# Patient Record
Sex: Female | Born: 1965 | Race: Black or African American | Hispanic: No | Marital: Married | Smoking: Never smoker
Health system: Southern US, Community
[De-identification: ages and names within clinical notes are randomized; demographics above are authoritative.]

## PROBLEM LIST (undated history)

## (undated) DIAGNOSIS — J309 Allergic rhinitis, unspecified: Secondary | ICD-10-CM

## (undated) DIAGNOSIS — D689 Coagulation defect, unspecified: Secondary | ICD-10-CM

## (undated) DIAGNOSIS — Z86718 Personal history of other venous thrombosis and embolism: Secondary | ICD-10-CM

## (undated) DIAGNOSIS — E785 Hyperlipidemia, unspecified: Secondary | ICD-10-CM

## (undated) DIAGNOSIS — J339 Nasal polyp, unspecified: Secondary | ICD-10-CM

## (undated) DIAGNOSIS — T7840XA Allergy, unspecified, initial encounter: Secondary | ICD-10-CM

## (undated) DIAGNOSIS — T8859XA Other complications of anesthesia, initial encounter: Secondary | ICD-10-CM

## (undated) DIAGNOSIS — I10 Essential (primary) hypertension: Secondary | ICD-10-CM

## (undated) DIAGNOSIS — R112 Nausea with vomiting, unspecified: Secondary | ICD-10-CM

## (undated) DIAGNOSIS — M8580 Other specified disorders of bone density and structure, unspecified site: Secondary | ICD-10-CM

## (undated) DIAGNOSIS — E119 Type 2 diabetes mellitus without complications: Secondary | ICD-10-CM

## (undated) DIAGNOSIS — M1990 Unspecified osteoarthritis, unspecified site: Secondary | ICD-10-CM

## (undated) DIAGNOSIS — Z9889 Other specified postprocedural states: Secondary | ICD-10-CM

## (undated) DIAGNOSIS — Z973 Presence of spectacles and contact lenses: Secondary | ICD-10-CM

## (undated) HISTORY — PX: COLONOSCOPY: SHX174

## (undated) HISTORY — DX: Allergic rhinitis, unspecified: J30.9

## (undated) HISTORY — DX: Essential (primary) hypertension: I10

## (undated) HISTORY — DX: Type 2 diabetes mellitus without complications: E11.9

## (undated) HISTORY — DX: Nasal polyp, unspecified: J33.9

## (undated) HISTORY — DX: Personal history of other venous thrombosis and embolism: Z86.718

## (undated) HISTORY — DX: Allergy, unspecified, initial encounter: T78.40XA

## (undated) HISTORY — DX: Other specified disorders of bone density and structure, unspecified site: M85.80

## (undated) HISTORY — DX: Coagulation defect, unspecified: D68.9

## (undated) HISTORY — DX: Hyperlipidemia, unspecified: E78.5

## (undated) HISTORY — PX: POLYPECTOMY: SHX149

---

## 1999-02-06 ENCOUNTER — Other Ambulatory Visit: Admission: RE | Admit: 1999-02-06 | Discharge: 1999-02-06 | Payer: Self-pay | Admitting: *Deleted

## 2000-02-12 ENCOUNTER — Other Ambulatory Visit: Admission: RE | Admit: 2000-02-12 | Discharge: 2000-02-12 | Payer: Self-pay | Admitting: *Deleted

## 2001-04-20 ENCOUNTER — Other Ambulatory Visit: Admission: RE | Admit: 2001-04-20 | Discharge: 2001-04-20 | Payer: Self-pay | Admitting: *Deleted

## 2001-08-09 ENCOUNTER — Other Ambulatory Visit: Admission: RE | Admit: 2001-08-09 | Discharge: 2001-08-09 | Payer: Self-pay | Admitting: *Deleted

## 2001-08-09 ENCOUNTER — Encounter (INDEPENDENT_AMBULATORY_CARE_PROVIDER_SITE_OTHER): Payer: Self-pay | Admitting: Specialist

## 2003-12-21 DIAGNOSIS — Z86718 Personal history of other venous thrombosis and embolism: Secondary | ICD-10-CM | POA: Insufficient documentation

## 2003-12-21 HISTORY — DX: Personal history of other venous thrombosis and embolism: Z86.718

## 2003-12-21 HISTORY — PX: UTERINE FIBROID SURGERY: SHX826

## 2004-03-06 ENCOUNTER — Encounter: Admission: RE | Admit: 2004-03-06 | Discharge: 2004-03-06 | Payer: Self-pay | Admitting: Internal Medicine

## 2004-06-16 ENCOUNTER — Encounter: Admission: RE | Admit: 2004-06-16 | Discharge: 2004-06-16 | Payer: Self-pay | Admitting: Obstetrics and Gynecology

## 2004-09-09 ENCOUNTER — Ambulatory Visit (HOSPITAL_COMMUNITY): Admission: RE | Admit: 2004-09-09 | Discharge: 2004-09-09 | Payer: Self-pay | Admitting: Gynecology

## 2005-01-18 ENCOUNTER — Ambulatory Visit: Payer: Self-pay | Admitting: Internal Medicine

## 2005-02-17 ENCOUNTER — Ambulatory Visit: Payer: Self-pay | Admitting: Internal Medicine

## 2005-03-30 ENCOUNTER — Ambulatory Visit: Payer: Self-pay | Admitting: Internal Medicine

## 2005-05-27 ENCOUNTER — Ambulatory Visit: Payer: Self-pay | Admitting: Internal Medicine

## 2005-07-29 ENCOUNTER — Ambulatory Visit: Payer: Self-pay | Admitting: Internal Medicine

## 2005-09-28 ENCOUNTER — Ambulatory Visit: Payer: Self-pay | Admitting: Internal Medicine

## 2006-02-03 ENCOUNTER — Ambulatory Visit: Payer: Self-pay | Admitting: Internal Medicine

## 2006-04-05 ENCOUNTER — Ambulatory Visit: Payer: Self-pay | Admitting: Internal Medicine

## 2006-07-04 ENCOUNTER — Ambulatory Visit: Payer: Self-pay | Admitting: Internal Medicine

## 2006-09-23 ENCOUNTER — Ambulatory Visit: Payer: Self-pay | Admitting: Internal Medicine

## 2006-10-21 ENCOUNTER — Ambulatory Visit: Payer: Self-pay | Admitting: Internal Medicine

## 2006-11-09 ENCOUNTER — Ambulatory Visit: Payer: Self-pay | Admitting: Internal Medicine

## 2006-12-20 HISTORY — PX: NASAL SINUS SURGERY: SHX719

## 2006-12-20 HISTORY — PX: LASIK: SHX215

## 2007-01-09 ENCOUNTER — Ambulatory Visit: Payer: Self-pay | Admitting: Internal Medicine

## 2007-02-07 ENCOUNTER — Ambulatory Visit: Payer: Self-pay | Admitting: Internal Medicine

## 2007-08-24 ENCOUNTER — Ambulatory Visit: Payer: Self-pay | Admitting: Internal Medicine

## 2007-10-21 ENCOUNTER — Inpatient Hospital Stay (HOSPITAL_COMMUNITY): Admission: EM | Admit: 2007-10-21 | Discharge: 2007-10-23 | Payer: Self-pay | Admitting: Emergency Medicine

## 2007-11-22 ENCOUNTER — Encounter: Payer: Self-pay | Admitting: Internal Medicine

## 2008-01-01 ENCOUNTER — Telehealth: Payer: Self-pay | Admitting: Internal Medicine

## 2008-01-01 DIAGNOSIS — J449 Chronic obstructive pulmonary disease, unspecified: Secondary | ICD-10-CM

## 2008-01-01 DIAGNOSIS — J4541 Moderate persistent asthma with (acute) exacerbation: Secondary | ICD-10-CM

## 2008-01-01 DIAGNOSIS — Z8672 Personal history of thrombophlebitis: Secondary | ICD-10-CM

## 2008-01-01 DIAGNOSIS — J3089 Other allergic rhinitis: Secondary | ICD-10-CM

## 2008-01-01 DIAGNOSIS — J4540 Moderate persistent asthma, uncomplicated: Secondary | ICD-10-CM | POA: Insufficient documentation

## 2008-01-01 DIAGNOSIS — J302 Other seasonal allergic rhinitis: Secondary | ICD-10-CM

## 2008-01-01 DIAGNOSIS — J339 Nasal polyp, unspecified: Secondary | ICD-10-CM | POA: Insufficient documentation

## 2008-01-01 DIAGNOSIS — J4489 Other specified chronic obstructive pulmonary disease: Secondary | ICD-10-CM | POA: Insufficient documentation

## 2008-01-01 DIAGNOSIS — J330 Polyp of nasal cavity: Secondary | ICD-10-CM

## 2008-01-02 ENCOUNTER — Encounter: Payer: Self-pay | Admitting: Internal Medicine

## 2008-01-02 ENCOUNTER — Telehealth (INDEPENDENT_AMBULATORY_CARE_PROVIDER_SITE_OTHER): Payer: Self-pay | Admitting: *Deleted

## 2008-02-06 ENCOUNTER — Encounter: Payer: Self-pay | Admitting: Internal Medicine

## 2008-02-07 ENCOUNTER — Ambulatory Visit: Payer: Self-pay | Admitting: Internal Medicine

## 2008-06-12 ENCOUNTER — Encounter: Payer: Self-pay | Admitting: Internal Medicine

## 2008-12-16 ENCOUNTER — Ambulatory Visit: Payer: Self-pay | Admitting: Internal Medicine

## 2009-01-08 ENCOUNTER — Ambulatory Visit (HOSPITAL_COMMUNITY): Admission: RE | Admit: 2009-01-08 | Discharge: 2009-01-08 | Payer: Self-pay | Admitting: Obstetrics and Gynecology

## 2009-01-16 ENCOUNTER — Ambulatory Visit: Payer: Self-pay | Admitting: Oncology

## 2009-01-22 ENCOUNTER — Ambulatory Visit: Payer: Self-pay | Admitting: Hematology & Oncology

## 2009-02-03 ENCOUNTER — Telehealth: Payer: Self-pay | Admitting: Internal Medicine

## 2009-02-14 ENCOUNTER — Ambulatory Visit: Payer: Self-pay | Admitting: Internal Medicine

## 2009-03-17 ENCOUNTER — Ambulatory Visit: Payer: Self-pay | Admitting: Hematology & Oncology

## 2009-03-25 LAB — HYPERCOAGULABLE PANEL, COMPREHENSIVE RET.
Anticardiolipin IgA: 14 [APL'U] (ref <13)
Beta-2-Glycoprotein I IgM: <4 U/mL (ref <10)
Homocysteine: 10.9 umol/L (ref 4.0–15.4)
Protein C, Total: 80 % (ref 70–140)
Protein S Activity: 15 % — ABNORMAL LOW (ref 69–129)
Protein S Ag, Total: 52 % — ABNORMAL LOW (ref 70–140)

## 2009-04-09 ENCOUNTER — Telehealth (INDEPENDENT_AMBULATORY_CARE_PROVIDER_SITE_OTHER): Payer: Self-pay | Admitting: *Deleted

## 2009-04-10 ENCOUNTER — Telehealth (INDEPENDENT_AMBULATORY_CARE_PROVIDER_SITE_OTHER): Payer: Self-pay | Admitting: *Deleted

## 2009-05-01 ENCOUNTER — Telehealth (INDEPENDENT_AMBULATORY_CARE_PROVIDER_SITE_OTHER): Payer: Self-pay | Admitting: *Deleted

## 2009-05-28 ENCOUNTER — Encounter: Payer: Self-pay | Admitting: Internal Medicine

## 2009-07-10 ENCOUNTER — Encounter: Payer: Self-pay | Admitting: Internal Medicine

## 2009-07-18 ENCOUNTER — Telehealth: Payer: Self-pay | Admitting: Internal Medicine

## 2009-07-24 ENCOUNTER — Ambulatory Visit: Payer: Self-pay | Admitting: Internal Medicine

## 2009-07-31 ENCOUNTER — Telehealth: Payer: Self-pay | Admitting: Internal Medicine

## 2009-08-01 ENCOUNTER — Telehealth: Payer: Self-pay | Admitting: Internal Medicine

## 2009-10-14 ENCOUNTER — Encounter: Payer: Self-pay | Admitting: Internal Medicine

## 2009-12-03 ENCOUNTER — Encounter: Payer: Self-pay | Admitting: Internal Medicine

## 2009-12-04 ENCOUNTER — Telehealth: Payer: Self-pay | Admitting: Internal Medicine

## 2010-05-11 ENCOUNTER — Telehealth (INDEPENDENT_AMBULATORY_CARE_PROVIDER_SITE_OTHER): Payer: Self-pay | Admitting: *Deleted

## 2010-05-13 ENCOUNTER — Ambulatory Visit: Payer: Self-pay | Admitting: Internal Medicine

## 2010-05-14 ENCOUNTER — Telehealth (INDEPENDENT_AMBULATORY_CARE_PROVIDER_SITE_OTHER): Payer: Self-pay | Admitting: *Deleted

## 2010-08-11 ENCOUNTER — Ambulatory Visit: Payer: Self-pay | Admitting: Hematology & Oncology

## 2010-08-31 ENCOUNTER — Ambulatory Visit (HOSPITAL_COMMUNITY): Admission: RE | Admit: 2010-08-31 | Discharge: 2010-08-31 | Payer: Self-pay | Admitting: Obstetrics and Gynecology

## 2010-08-31 ENCOUNTER — Telehealth (INDEPENDENT_AMBULATORY_CARE_PROVIDER_SITE_OTHER): Payer: Self-pay | Admitting: *Deleted

## 2010-09-04 ENCOUNTER — Ambulatory Visit: Payer: Self-pay | Admitting: Internal Medicine

## 2010-10-07 ENCOUNTER — Ambulatory Visit: Payer: Self-pay | Admitting: Internal Medicine

## 2010-10-07 ENCOUNTER — Telehealth (INDEPENDENT_AMBULATORY_CARE_PROVIDER_SITE_OTHER): Payer: Self-pay | Admitting: *Deleted

## 2010-10-12 LAB — CONVERTED CEMR LAB: IgE (Immunoglobulin E), Serum: 34.6 intl units/mL (ref 0.0–180.0)

## 2010-10-27 ENCOUNTER — Encounter: Payer: Self-pay | Admitting: Internal Medicine

## 2010-11-19 ENCOUNTER — Ambulatory Visit: Payer: Self-pay | Admitting: Internal Medicine

## 2010-11-25 ENCOUNTER — Encounter: Payer: Self-pay | Admitting: Internal Medicine

## 2010-11-30 ENCOUNTER — Encounter: Payer: Self-pay | Admitting: Internal Medicine

## 2010-12-08 ENCOUNTER — Telehealth (INDEPENDENT_AMBULATORY_CARE_PROVIDER_SITE_OTHER): Payer: Self-pay | Admitting: *Deleted

## 2010-12-10 ENCOUNTER — Ambulatory Visit: Payer: Self-pay | Admitting: Internal Medicine

## 2010-12-24 LAB — CBC
HCT: 40.4 % (ref 36.0–46.0)
Hemoglobin: 13.3 g/dL (ref 12.0–15.0)
MCH: 26.1 pg (ref 26.0–34.0)
MCHC: 32.9 g/dL (ref 30.0–36.0)
MCV: 79.2 fL (ref 78.0–100.0)
Platelets: 269 10*3/uL (ref 150–400)
RBC: 5.1 MIL/uL (ref 3.87–5.11)
RDW: 13.6 % (ref 11.5–15.5)
WBC: 8 10*3/uL (ref 4.0–10.5)

## 2010-12-30 ENCOUNTER — Ambulatory Visit (HOSPITAL_COMMUNITY)
Admission: RE | Admit: 2010-12-30 | Discharge: 2010-12-30 | Payer: Self-pay | Source: Home / Self Care | Attending: Obstetrics and Gynecology | Admitting: Obstetrics and Gynecology

## 2011-01-04 LAB — HCG, SERUM, QUALITATIVE: Preg, Serum: NEGATIVE

## 2011-01-10 ENCOUNTER — Encounter: Payer: Self-pay | Admitting: Obstetrics and Gynecology

## 2011-01-14 ENCOUNTER — Ambulatory Visit
Admission: RE | Admit: 2011-01-14 | Discharge: 2011-01-14 | Payer: Self-pay | Source: Home / Self Care | Attending: Internal Medicine | Admitting: Internal Medicine

## 2011-01-19 NOTE — Assessment & Plan Note (Signed)
Summary: asthma flare//lmr   Primary Provider/Referring Provider:  Merla Riches  CC:  Asthma flare up-SOB and wheezing.[name]  History of Present Illness: 12/16/08- Asthma/copd, nasal polyps, allergic rhinitis No recent prednisone, probably in 4-5 months. Has breathed better since nasal polypectomy. Now 1-2 weeks incr wheeze, nonpurulent with no fever or pain. Probably a cold. Denies pain, fever, purulent ofr blood.  02/14/09- Asthma/copd, nasal polyps, allergic rhinitis Was well with incidental rhinorhea, until 2 days ago when she began more wheezing. Mentions stress at work but doesnt feel that she has a cold. Had continued prednisone 10 mg every other day. Denies fever, chest pain, palpitation, edema, sore throat, enlarged glands, rash, heart burn.  07/24/09- Asthma/ COPD, nasal polyps, allergic rhinitis Worse asthma 7-10 days. Treated by Dr Lazarus Salines then for sinusitis with nasal polyps returning. Taking clindamycin and cipro. No GI distress lately. has felt tired. Not sleeping well. GYN checked basic labs. No longer taking coumadin for hx DVT. Gets leg and hip aches as she comes off prednisone. She ran out 2 weeks ago.   May 19, 2010- Asthma/ COPD, nasal polyps, allergic rhinitis Failed Xolair,  DVT/ Leiden First here since last August. Says "this asthma has been kicking my butt". Got through winter ok. Back living  and working in [place] again. 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 Wheezes all day, worst at night. Cough and nasal mucus light yellow. Sore throat yesterday but no fever or nodes. Has ENT appt today with Dr Lazarus Salines. Denies heartburn/ reflux. Using Symbicort 160, using Ventolin rescue twice daily, nebulizer last used 2 days ago and last night. Started prednisone 10 mg x 2 every other day, starting a week ago. Trying to get pregnant- not now. Hasn't checked peak flow in a while- encouraged to do so.   Asthma History    Asthma Control Assessment:    Age  range: 12+ years    Symptoms: throughout the day    Nighttime Awakenings: 1-3/week    Interferes w/ normal activity: some limitations    SABA use (not for EIB): several times per day    Asthma Control Assessment: Very Poorly Controlled   Preventive Screening-Counseling & Management  Alcohol-Tobacco     Smoking Status: never  Current Medications (verified): 1)  Symbicort 160-4.5 Mcg/act  Aero (Budesonide-Formoterol Fumarate) .... Inhale 2 Puffs Two Times A Day 2)  Duoneb 2.5-0.5 Mg/18ml  Soln (Albuterol-Ipratropium) .... As Needed 3)  Ventolin Hfa 108 (90 Base) Mcg/act Aers (Albuterol Sulfate) .... 2 Puffs Four Times A Day As Needed 4)  Veramyst 27.5 Mcg/spray  Susp (Fluticasone Furoate) .[name].. 1 Sparya Each Nostril Once Daily 5)  Nasacort Aq 55 Mcg/act  Aers (Triamcinolone Acetonide(Nasal)) .... Take 2 Sprays in Each Nostril Once A Day As Needed 6)  Prednisone 10 Mg Tabs (Prednisone) .[name].. 1 Tab Four Times Daily X 2 Days, 3 Times Daily X 2 Days, 2 Times Daily X 2 Days, 1 Time Daily X 2 Days  Allergies (verified): 1)  ! Asa 2)  ! Pcn  Past History:  Past Medical History: Last updated: 02/07/2008 Asthma- steroid dependent Nasal polyps Aspirin sensitivity (Sampter's Triad) allergic rhinitis Deep Vein Thrombosis/Phlebitis- Factor V Leiden  Past Surgical History: Last updated: 02/07/2008 Uterine fibroids Nasal polypectomy  Family History: Last updated: May 19, 2010 Father died MI epilepsy hypertension liver disease Aunt- asthma, died multiple med probs  Social History: Last updated: 2010-05-19 Patient never smoked.  Married Manufacturing engineer.  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  Review of Systems      See HPI       The patient complains of shortness of breath with  activity, shortness of breath at rest, productive cough, and non-productive cough.  The patient denies coughing up blood, chest pain, irregular heartbeats, acid heartburn, indigestion, loss of appetite, weight change, abdominal pain, difficulty swallowing, sore throat, tooth/dental problems, headaches, nasal congestion/difficulty breathing through nose, and sneezing.    Vital Signs:  Patient profile:   45 year old female Height:      67 inches Weight:      198 pounds BMI:     31.12 O2 Sat:      96 % on Room air Pulse rate:   97 / minute BP sitting:   114 / 82  (left arm) Cuff size:   large  Vitals Entered By: Reynaldo Minium CMA (May 13, 2010 11:27 AM)  O2 Flow:  Room air  Physical Exam  Additional Exam:  General: A/Ox3; pleasant and cooperative, NAD, SKIN: no rash, lesions NODES: no lymphadenopathy HEENT: [place]/AT, EOM- WNL, Conjuctivae- clear, PERRLA, TM-WNL, Nose- clear- no polyps seen, sniffing, Throat- clear and wnl, not red, Mallampati  III NECK: Supple w/ fair ROM, JVD- none, normal carotid impulses w/o bruits Thyroid-  CHEST: coarse bilateral wheeze and mild rhonchi, no accessory mucscle use HEART: RRR, no m/g/r heard ABDOMEN: Soft and nl;  ZOX:WRUE, nl pulses, no edema  NEURO: Grossly intact to observation      Impression & Recommendations:  Problem # 1:  ASTHMA (ICD-493.90) Severe chronic asthma with exacerbation. Often steroid dependent and will need more now. There may be an infection. She has been reasonably compliant. Specific work place triggers and avoidable environmental factors don't seem important. Shehad failed Xolair and allergy vaccine.   Problem # 2:  ALLERGIC RHINITIS (ICD-477.9)  Rhinosinusitis with exacerbation. Will give Biaxin after discussing options. Her updated medication list for this problem includes:    Veramyst 27.5 Mcg/spray Susp (Fluticasone furoate) .[name]... 1 sparya each nostril once daily    Nasacort Aq 55 Mcg/act Aers (Triamcinolone  acetonide(nasal)) .[name]... Take 2 sprays in each nostril once a day as needed  Orders: Est. Patient Level IV (45409)  Problem # 3:  Hx of DEEP VEIN THROMBOSIS/PHLEBITIS (ICD-451.19) Factor V Leiden. Has been off anticoagulation over a year. Understands to avoid stasis/ stagnation.  Medications Added to Medication List This Visit: 1)  Ventolin Hfa 108 (90 Base) Mcg/act Aers (Albuterol sulfate) .... 2 puffs four times a day as needed 2)  Clarithromycin 500 Mg Tabs (Clarithromycin) .[name].. 1 twice daily after meals  Other Orders: Depo- Medrol 80mg  (J1040) Admin of Therapeutic Inj  intramuscular or subcutaneous (81191) Nebulizer Tx (47829)  Patient Instructions: 1)  Please schedule a follow-up appointment in 2 months. 2)  Scripts printed and sent as discussed 3)  Please use your peak flow meter so you can talk to me about what a best score is and what you are like on average and bad days. 4)  Neb xop 1.25 5)  depo 80 Prescriptions: NASACORT AQ 55 MCG/ACT  AERS (TRIAMCINOLONE ACETONIDE(NASAL)) Take 2 sprays in each nostril once a day as needed  #1 x prn   Entered and Authorized by:   Waymon Budge MD   Signed by:   Waymon Budge MD on 05/13/2010   Method  used:   Print then Give to Patient   RxID:   [id number] VENTOLIN HFA 108 (90 BASE) MCG/ACT AERS (ALBUTEROL SULFATE) 2 puffs four times a day as needed  #1 x prn   Entered and Authorized by:   Waymon Budge MD   Signed by:   Waymon Budge MD on 05/13/2010   Method used:   Print then Give to Patient   RxID:   [id number] SYMBICORT 160-4.5 MCG/ACT  AERO (BUDESONIDE-FORMOTEROL FUMARATE) Inhale 2 puffs two times a day  #1 x prn   Entered and Authorized by:   Waymon Budge MD   Signed by:   Waymon Budge MD on 05/13/2010   Method used:   Print then Give to Patient   RxID:   [id number] DUONEB 2.5-0.5 MG/3ML  SOLN (ALBUTEROL-IPRATROPIUM) as needed  #50 x prn   Entered and Authorized by:   Waymon Budge MD   Signed  by:   Waymon Budge MD on 05/13/2010   Method used:   Electronically to        [facility] [address] (retail)       [address]       [place], [place]  [address]       Ph: [phone]       Fax: [phone]   RxID:   [id number] PREDNISONE 10 MG TABS (PREDNISONE) 1 tab four times daily x 2 days, 3 times daily x 2 days, 2 times daily x 2 days, 1 time daily x 2 days  #200 x 0   Entered and Authorized by:   Waymon Budge MD   Signed by:   Waymon Budge MD on 05/13/2010   Method used:   Electronically to        [facility] [address] (retail)       [address]       [place], [place]  [address]       Ph: [phone]       Fax: [phone]   RxID:   [id number] CLARITHROMYCIN 500 MG TABS (CLARITHROMYCIN) 1 twice daily after meals  #14 x 1   Entered and Authorized by:   Waymon Budge MD   Signed by:   Waymon Budge MD on 05/13/2010   Method used:   Electronically to        [facility] [address] (retail)       [address]       [place], [place]  [address]       Ph: [phone]       Fax: [phone]   RxID:   [id number]     Medication Administration  Injection # 1:    Medication: Depo- Medrol 80mg     Diagnosis: ASTHMA (ICD-493.90)    Route: IM    Site: RUOQ gluteus    Exp Date: 10/2012    Lot #: [id number]    Mfr: Pharmacia    Patient tolerated injection without complications    Given by: Elray Buba RN (May 13, 2010 12:13 PM)  Medication # 1:    Medication: Xopenex 1.25mg     Diagnosis: ASTHMA (ICD-493.90)    Dose: 1.25mg     Route: inhaled    Exp Date: 06-11    Lot #: [id number]    Mfr: SEPRACOR    Patient tolerated medication  without complications    Given by: Elray Buba RN (May 13, 2010 12:14 PM)  Orders Added: 1)  Est. Patient Level IV [60630] 2)  Depo- Medrol 80mg  [J1040] 3)  Admin of Therapeutic Inj  intramuscular or subcutaneous [96372] 4)  Nebulizer Tx [16010]

## 2011-01-19 NOTE — Progress Notes (Signed)
Summary: meds clarification  Phone Note Call from Patient Call back at Home Phone [phone]   Caller: Patient Call For: young  Summary of Call: pt has questions re: meds. needs directions for neb and also wants to know if it's ok for her to continue taking abx as her surgeon has her taking avelox as well.  Initial call taken by: Tivis Ringer, CNA,  May 14, 2010 4:56 PM  Follow-up for Phone Call        Spoke with pt.  She wanted to know if she could mix albuterol and ipratropium together.  I advised that it is okay to mix these 2 meds.  Pt also wants to know if she should keep taking biaxin because her ENT surgeon saw her yesterday and started her on avelox.  Please advise, thanks! Follow-up by: Vernie Murders,  May 14, 2010 5:12 PM  Additional Follow-up for Phone Call Additional follow up Details #1::        She can stop biaxin since she has started Avelox.  Yes, it is ok to combine albuterol and ipratropium neb solutions. Additional Follow-up by: Waymon Budge MD,  May 14, 2010 5:16 PM    Additional Follow-up for Phone Call Additional follow up Details #2::    Spoke with pt and advised okay to stop biaxin since she started avelox.  Pt verbalized understanding. Follow-up by: Vernie Murders,  May 14, 2010 5:20 PM

## 2011-01-19 NOTE — Progress Notes (Signed)
Summary: speak to [facility]  Phone Note Call from Patient   Caller: Patient Call For: young  Summary of Call: requests to speak to [facility] (wouldn't say why). call after 3 today # [phone] Initial call taken by: Tivis Ringer, CNA,  October 07, 2010 11:34 AM  Follow-up for Phone Call        Spoke with patient-needed samples if we had any; was able to give one sample of each this time.Reynaldo Minium CMA  October 08, 2010 10:28 AM

## 2011-01-19 NOTE — Letter (Signed)
Summary: [facility]  [facility]   Imported By: Sherian Rein 11/05/2010 12:37:05  _____________________________________________________________________  External Attachment:    Type:   Image     Comment:   External Document

## 2011-01-19 NOTE — Progress Notes (Signed)
Summary: appt  Phone Note Call from Patient Call back at Home Phone [phone]   Caller: Patient Call For: young Reason for Call: Talk to Nurse Summary of Call: asthma flare - wheezing, chest tight, runny nose.  Need to try for Wednesday or thursday - her days off. Initial call taken by: Eugene Gavia,  May 11, 2010 4:32 PM  Follow-up for Phone Call        Spoke with pt.  She is c/o increased SOB and some wheezing x 2 days.  Requests ov with CDY, okay poer Katie to add on Wed 5/25 at 11:15.  Appt was sched and pt aware to arrive at 11 am per [facility].  Advised that she call for sooner appt or go to ER if breathing worsens. Follow-up by: Vernie Murders,  May 11, 2010 4:39 PM

## 2011-01-19 NOTE — Assessment & Plan Note (Signed)
Summary: FOLLOW UP VISIT PER CDY/INHALER/KCW   Primary Provider/Referring Provider:  Merla Riches  CC:  Follow up visit per CDY-wheezing today.[name]  History of Present Illness: 07/24/09- Asthma/ COPD, nasal polyps, allergic rhinitis Worse asthma 7-10 days. Treated by Dr Lazarus Salines then for sinusitis with nasal polyps returning. Taking clindamycin and cipro. No GI distress lately. has felt tired. Not sleeping well. GYN checked basic labs. No longer taking coumadin for hx DVT. Gets leg and hip aches as she comes off prednisone. She ran out 2 weeks ago.   05/14/10- Asthma/ COPD, nasal polyps, allergic rhinitis Failed Xolair,  DVT/ Leiden First here since last August. Says "this asthma has been kicking my butt". Got through winter ok. Back living  and working in [place] again. 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 Wheezes all day, worst at night. Cough and nasal mucus light yellow. Sore throat yesterday but no fever or nodes. Has ENT appt today with Dr Lazarus Salines. Denies heartburn/ reflux. Using Symbicort 160, using Ventolin rescue twice daily, nebulizer last used 2 days ago and last night. Started prednisone 10 mg x 2 every other day, starting a week ago. Trying to get pregnant- not now. Hasn't checked peak flow in a while- encouraged to do so.  September 04, 2010- Asthma/ COPD- steroid dependent, nasal polyps, allergic rhinitis Not a bad summer. She expects to wheeze some, several days a week. Has been having to use prednisone. Usually using it every other day, but over the last 4 months has been having to to take 10 mg most days. Increased use of neb and rescue inhaler about twice daily. Blew nose green 2 days ago, but not since and not coughing up anything. Had flu vax at work.    Asthma History    Initial Asthma Severity Rating:    Age range: 12+ years    Symptoms: throughout the day    Nighttime Awakenings: 0-2/month    Interferes w/ normal activity: no limitations  SABA use (not for EIB): several times per day    Asthma Severity Assessment: Severe Persistent   Preventive Screening-Counseling & Management  Alcohol-Tobacco     Smoking Status: never  Current Medications (verified): 1)  Symbicort 160-4.5 Mcg/act  Aero (Budesonide-Formoterol Fumarate) .... Inhale 2 Puffs Two Times A Day 2)  Duoneb 2.5-0.5 Mg/8ml  Soln (Albuterol-Ipratropium) .... As Needed 3)  Proair Hfa 108 (90 Base) Mcg/act Aers (Albuterol Sulfate) .... 2 Puffs Four Times A Day As Needed Rescue Inhaler 4)  Veramyst 27.5 Mcg/spray  Susp (Fluticasone Furoate) .[name].. 1 Sparya Each Nostril Once Daily 5)  Nasacort Aq 55 Mcg/act  Aers (Triamcinolone Acetonide(Nasal)) .... Take 2 Sprays in Each Nostril Once A Day As Needed  Allergies (verified): 1)  ! Asa 2)  ! 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  Risk Factors: Smoking Status: never (09/04/2010)  Review of Systems      See HPI       The patient complains of shortness of breath with activity, productive cough, and nasal congestion/difficulty breathing through nose.  The patient denies shortness of breath at rest,  non-productive cough, coughing up blood, chest pain, irregular heartbeats, acid heartburn, indigestion, loss of appetite, weight change, abdominal pain, difficulty swallowing, sore throat, tooth/dental problems, headaches, and sneezing.    Vital Signs:  Patient profile:   45 year old female Height:      67 inches Weight:      199  pounds BMI:     31.28 O2 Sat:      97 % on Room air Pulse rate:   60 / minute BP sitting:   130 / 80  (left arm) Cuff size:   regular  Vitals Entered By: Reynaldo Minium CMA (September 04, 2010 11:52 AM)  O2 Flow:  Room air CC: Follow up visit per CDY-wheezing today.   Physical Exam  Additional Exam:  General: A/Ox3; pleasant and cooperative, NAD, SKIN: no rash, lesions NODES: no lymphadenopathy HEENT: [place]/AT, EOM- WNL, Conjuctivae- clear, PERRLA, TM-WNL, Nose- clear- no polyps seen, sniffing, Throat- clear and wnl, not red, Mallampati  III NECK: Supple w/ fair ROM, JVD- none, normal carotid impulses w/o bruits Thyroid-  CHEST:unlabored without wheeze or cough now HEART: RRR, no m/g/r heard ABDOMEN: Soft and nl;  NFA:OZHY, nl pulses, no edema  NEURO: Grossly intact to observation      Impression & Recommendations:  Problem # 1:  ASTHMA, STEROID DEPENDENT (ICD-493.10) I will try replacing Symbicort at least temporarily with Dulera 200 to see if the steroid dfference helps her. Today she sounds pretty good, but this has been an ongoing control problem for her. I think she is compliant with her meds and that technique is ok. We did discuss what reflux is and what it feels like, so she can watch for it.  We again discussed long term steroid side effects and concerns.  Problem # 2:  DEEP VENOUS THROMBOPHLEBITIS, HX OF (ICD-V12.52) No recurrence. We again reviewed prophyllaxis.  Problem # 3:  NASAL POLYP (ICD-471.0) We continue to watch for recurrence, but no polyps seen lately.  Medications Added to Medication List This Visit: 1)  Dulera 200-5 Mcg/act Aero (Mometasone furo-formoterol fum) .... 2 puffs and rinse mouth, twice daily 2)  Prednisone 10 Mg Tabs (Prednisone) .[name].. 1-2 daily as needed for asthma  Other Orders: Est. Patient Level IV (86578)  Patient Instructions: 1)  Please schedule a follow-up appointment in 1 month. 2)  Sample Dulera 200/5: 3)   2 puffs and rinse  mouth well, twice daily  4)  Try this instead of Symbicort to see if it works better.  5)  Refill for prednisone to hold Prescriptions: PREDNISONE 10 MG TABS (PREDNISONE) 1-2 daily as needed for asthma  #200 x 0   Entered and Authorized by:   Waymon Budge MD   Signed by:   Waymon Budge MD on 09/04/2010   Method used:   Print then Give to Patient   RxID:   [id number] DULERA 200-5 MCG/ACT AERO (MOMETASONE FURO-FORMOTEROL FUM) 2 puffs and rinse mouth, twice daily  #1 x prn   Entered and Authorized by:   Waymon Budge MD   Signed by:   Waymon Budge MD on 09/04/2010   Method used:   Samples Given   RxID:   [id number]

## 2011-01-19 NOTE — Assessment & Plan Note (Signed)
Summary: 1 month return/mhh   Primary Provider/Referring Provider:  Merla Riches  CC:  1 month followup, c/o wheezing a lot, some sob runny nose, and no coughing.  History of Present Illness: May 28, 2010- Asthma/ COPD, nasal polyps, allergic rhinitis Failed Xolair,  DVT/ Leiden First here since last August. Says "this asthma has been kicking my butt". Got through winter ok. Back living  and working in [place] again. 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 Wheezes all day, worst at night. Cough and nasal mucus light yellow. Sore throat yesterday but no fever or nodes. Has ENT appt today with Dr Lazarus Salines. Denies heartburn/ reflux. Using Symbicort 160, using Ventolin rescue twice daily, nebulizer last used 2 days ago and last night. Started prednisone 10 mg x 2 every other day, starting a week ago. Trying to get pregnant- not now. Hasn't checked peak flow in a while- encouraged to do so.  September 04, 2010- Asthma/ COPD- steroid dependent, nasal polyps, allergic rhinitis Not a bad summer. She expects to wheeze some, several days a week. Has been having to use prednisone. Usually using it every other day, but over the last 4 months has been having to to take 10 mg most days. Increased use of neb and rescue inhaler about twice daily. Blew nose green 2 days ago, but not since and not coughing up anything. Had flu vax at work.   October 07, 2010 -Asthma/ COPD- steroid dependent, nasal polyps, allergic rhinitis Nurse CC 1 month followup, c/o wheezing a lot, some sob runny nose, no coughing She has prednisone and is using Dulera now instead of Symbicort. Last week began increased wheeze, blaming the weather, and started prednisone taper now down to 20 mg daiy. Likes the [place] and thinks it helps. This is the first prednisone since last month. Yellow from sinuses so she suspects sinus infection, but no headache or fever. We discussed Xolair.    Asthma History    Asthma Control  Assessment:    Age range: 12+ years    Symptoms: >2 days/week    Nighttime Awakenings: 0-2/month    Interferes w/ normal activity: some limitations    SABA use (not for EIB): 0-2 days/week    Asthma Control Assessment: Not Well Controlled   Preventive Screening-Counseling & Management  Alcohol-Tobacco     Smoking Status: never  Current Medications (verified): 1)  Symbicort 160-4.5 Mcg/act  Aero (Budesonide-Formoterol Fumarate) .... Inhale 2 Puffs Two Times A Day 2)  Duoneb 2.5-0.5 Mg/61ml  Soln (Albuterol-Ipratropium) .... As Needed 3)  Proair Hfa 108 (90 Base) Mcg/act Aers (Albuterol Sulfate) .... 2 Puffs Four Times A Day As Needed Rescue Inhaler 4)  Veramyst 27.5 Mcg/spray  Susp (Fluticasone Furoate) .[name].. 1 Sparya Each Nostril Once Daily 5)  Nasacort Aq 55 Mcg/act  Aers (Triamcinolone Acetonide(Nasal)) .... Take 2 Sprays in Each Nostril Once A Day As Needed 6)  Dulera 200-5 Mcg/act Aero (Mometasone Furo-Formoterol Fum) .... 2 Puffs and Rinse Mouth, Twice Daily 7)  Prednisone 10 Mg Tabs (Prednisone) .[name].. 1-2 Daily As Needed For Asthma  Allergies: 1)  ! Asa 2)  ! 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  Risk Factors: Smoking Status: never (10/07/2010)  Review of Systems      See HPI       The patient complains of shortness of breath with activity and nasal congestion/difficulty breathing through nose.  The patient denies shortness of breath at rest, productive cough, non-productive  cough, coughing up blood, chest pain, irregular heartbeats, acid heartburn, indigestion, loss of appetite, weight change, abdominal pain, difficulty swallowing, sore throat, tooth/dental problems, headaches, and sneezing.    Vital Signs:  Patient profile:   45 year old female Height:      67 inches Weight:      201.50 pounds BMI:     31.67 O2 Sat:      97 % on Room air Pulse rate:   69 / minute BP sitting:   120 / 90  (right arm) Cuff size:   regular  Vitals Entered By: Kandice Hams CMA (October 07, 2010 11:06 AM)  O2 Flow:  Room air CC: 1 month followup, c/o wheezing a lot, some sob runny nose, no coughing   Physical Exam  Additional Exam:  General: A/Ox3; pleasant and cooperative, NAD, SKIN: no rash, lesions NODES: no lymphadenopathy HEENT: [place]/AT, EOM- WNL, Conjuctivae- clear, PERRLA, TM-WNL, Nose- clear- no polyps seen, no drainage seen, Throat- clear and wnl, not red, Mallampati  III-IV NECK: Supple w/ fair ROM, JVD- none, normal carotid impulses w/o bruits Thyroid-  CHEST:unlabored without wheeze or cough now HEART: RRR, no m/g/r heard ABDOMEN: Soft and nl;  MWU:XLKG, nl pulses, no edema  NEURO: Grossly intact to observation      Impression & Recommendations:  Problem # 1:  ASTHMA, STEROID DEPENDENT (ICD-493.10) She reports exacerbation although exam right now is not impressive on prednisone. Weather is the likely trigger.  She was out of town before and Xolair was impractical then, but might be a better option now.  We will check IgE.. If low we will try to recheck it if we can catch her off prednisone. .   Problem # 2:  ALLERGIC RHINITIS (ICD-477.9)  Will give antibiotic for possible rhinosinusitits as she suggests now. She says Zpak worked well in past for this. Her updated medication list for this problem includes:    Veramyst 27.5 Mcg/spray Susp (Fluticasone furoate) .[name]... 1 sparya each nostril once daily    Nasacort Aq 55 Mcg/act Aers (Triamcinolone  acetonide(nasal)) .[name]... Take 2 sprays in each nostril once a day as needed  Problem # 3:  NASAL POLYP (ICD-471.0) No recurrence seen.  Medications Added to Medication List This Visit: 1)  Zithromax Z-pak 250 Mg Tabs (Azithromycin) .... 2 today then one daily  Other Orders: Est. Patient Level IV (40102) T-IgE (Immunoglobulin E) ([id number])  Patient Instructions: 1)  Please schedule a follow-up appointment in 1 month. 2)  script for Z pak to [facility] 3)  Lab for IgE level Prescriptions: DULERA 200-5 MCG/ACT AERO (MOMETASONE FURO-FORMOTEROL FUM) 2 puffs and rinse mouth, twice daily  #1 x 2   Entered by:   Kandice Hams CMA   Authorized by:   Waymon Budge MD   Signed by:   Kandice Hams CMA on 10/07/2010   Method used:   Electronically to        [facility]* (retail)       [address] [place], [place]  [address]  Ph: [phone]       Fax: [phone]   RxID:   [id number] Z-PAK 250 MG TABS (AZITHROMYCIN) 2 today then one daily  #1 pak x 0   Entered and Authorized by:   Waymon Budge MD   Signed by:   Waymon Budge MD on 10/07/2010   Method used:   Electronically to        [facility]* (retail)       [address] [place], [place]  [address]       Ph: [phone]       Fax: [phone]   RxID:   [id number]

## 2011-01-19 NOTE — Letter (Signed)
Summary: [facility]  [facility]   Imported By: Lester [place] 05/27/2010 10:45:48  _____________________________________________________________________  External Attachment:    Type:   Image     Comment:   External Document

## 2011-01-19 NOTE — Progress Notes (Signed)
Summary: APPT REQ-RX HFA-  Phone Note Call from Patient Call back at Home Phone [phone]   Caller: Patient Call For: young Reason for Call: Talk to Nurse Summary of Call: pt wants to know if we can squeeze her in later this week as she is off work and said she had some questions re: her inhaler. Initial call taken by: Eugene Gavia,  August 31, 2010 9:45 AM  Follow-up for Phone Call         [facility] Yetta Barre RN  August 31, 2010 10:07 AM   Pt states she is not having a "bad week" but states she is due for a check up. Pt is aware this week is full and can wait a few more weeks but needs new Rx for Albuterol HFA.  Pt c/o previous Rx for Ventolin is no longer on the market and the substitute is $60. I called pt's pharmacy ([facility]) and spoke with Rhunette Croft who advised Relion Ventolin HFA has been d/c'd (which is [employer] brand HFA $9). I asked the prices of the Albuterol HFA and was provided with the cash pay amounts as follows ProAir $39.46, Ventolin $46.08, and Proventil HFA $97.08. Please advise. thanks. Zackery Barefoot CMA  August 31, 2010 10:34 AM   Additional Follow-up for Phone Call Additional follow up Details #1::        I changed medlist to Proair. Ok to send that, and find her a work-in appointment Additional Follow-up by: Waymon Budge MD,  August 31, 2010 1:42 PM    Additional Follow-up for Phone Call Additional follow up Details #2::    katie, pt called back and requests generic for proair be called in to [facility] [address]. call pt to confirm when this has been done. pt # [id number]. Tivis Ringer, CNA  August 31, 2010 2:20 PM     Pt aware to be here Friday at 1115 for ROV.Reynaldo Minium CMA  August 31, 2010 2:27 PM   Additional Follow-up for Phone Call Additional follow up Details #3:: Details for Additional Follow-up Action Taken: Spoke with pt and verfied that she is aware of appt date/time.  I advised that the rx for proair was sent to  [facility] [address]- she is aware no generic of this available. Additional Follow-up by: Vernie Murders,  August 31, 2010 2:33 PM  New/Updated Medications: PROAIR HFA 108 (90 BASE) MCG/ACT AERS (ALBUTEROL SULFATE) 2 puffs four times a day as needed rescue inhaler Prescriptions: PROAIR HFA 108 (90 BASE) MCG/ACT AERS (ALBUTEROL SULFATE) 2 puffs four times a day as needed rescue inhaler  #1 x 1   Entered by:   Vernie Murders   Authorized by:   Waymon Budge MD   Signed by:   Vernie Murders on 08/31/2010   Method used:   Electronically to        [facility] (retail)       [address]       [place], [place]  [address]       Ph: [phone]       Fax: [phone]   RxID:   [id number] PROAIR HFA 108 (90 BASE) MCG/ACT AERS (ALBUTEROL SULFATE) 2 puffs four times a day as needed rescue inhaler  #1 x prn   Entered by:   Waymon Budge MD   Authorized by:   Pulmonary Triage   Signed by:   Waymon Budge MD on 08/31/2010   Method used:  Historical   RxID:   [id number]

## 2011-01-19 NOTE — Assessment & Plan Note (Signed)
Summary: rov 1 month ///kp   Primary Provider/Referring Provider:  Merla Riches  CC:  44month follow up visit-discuss Xolair shots.  History of Present Illness:  September 04, 2010- Asthma/ COPD- steroid dependent, nasal polyps, allergic rhinitis Not a bad summer. She expects to wheeze some, several days a week. Has been having to use prednisone. Usually using it every other day, but over the last 4 months has been having to to take 10 mg most days. Increased use of neb and rescue inhaler about twice daily. Blew nose green 2 days ago, but not since and not coughing up anything. Had flu vax at work.   October 07, 2010 -Asthma/ COPD- steroid dependent, nasal polyps, allergic rhinitis Nurse CC 1 month followup, c/o wheezing a lot, some sob runny nose, no coughing She has prednisone and is using Dulera now instead of Symbicort. Last week began increased wheeze, blaming the weather, and started prednisone taper now down to 20 mg daiy. Likes the [place] and thinks it helps. This is the first prednisone since last month. Yellow from sinuses so she suspects sinus infection, but no headache or fever. We discussed Xolair.   November 19, 2010- -Asthma/ COPD- steroid dependent, nasal polyps, allergic rhinitis CC- 44month follow up visit-discuss Xolair shots Doing a lot better since last here, except she needed one prednisone taper ending 2 weeks ago.  She wanted to talk again about possibly trying Xolair. We had a long talk about method, logistics, risks and realistic expectations of Xolair and gave handout.     Asthma History    Asthma Control Assessment:    Age range: 12+ years    Symptoms: >2 days/week    Nighttime Awakenings: 0-2/month    Interferes w/ normal activity: some limitations    SABA use (not for EIB): >2 days/week    Asthma Control Assessment: Not Well Controlled   Preventive Screening-Counseling & Management  Alcohol-Tobacco     Smoking Status: never  Current Medications  (verified): 1)  Duoneb 2.5-0.5 Mg/110ml  Soln (Albuterol-Ipratropium) .... As Needed 2)  Proair Hfa 108 (90 Base) Mcg/act Aers (Albuterol Sulfate) .... 2 Puffs Four Times A Day As Needed Rescue Inhaler 3)  Veramyst 27.5 Mcg/spray  Susp (Fluticasone Furoate) .[name].. 1 Sparya Each Nostril Once Daily 4)  Nasacort Aq 55 Mcg/act  Aers (Triamcinolone Acetonide(Nasal)) .... Take 2 Sprays in Each Nostril Once A Day As Needed 5)  Dulera 200-5 Mcg/act Aero (Mometasone Furo-Formoterol Fum) .... 2 Puffs and Rinse Mouth, Twice Daily  Allergies (verified): 1)  ! Asa 2)  ! 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  Risk Factors: Smoking Status: never (11/19/2010)  Review of Systems      See HPI       The patient complains of shortness of breath with activity and non-productive cough.  The patient denies shortness of breath at rest, productive cough, coughing up blood, chest pain, irregular heartbeats, acid heartburn, indigestion, loss of appetite, weight change, abdominal pain, difficulty swallowing, sore throat, tooth/dental problems, headaches, nasal congestion/difficulty breathing through nose, sneezing, itching, ear ache, joint stiffness or pain, rash, change in color of mucus,  and fever.    Vital Signs:  Patient profile:   45 year old female Height:      67 inches Weight:      199.13 pounds BMI:     31.30 O2 Sat:      100 % on Room air Pulse rate:   71 / minute BP sitting:   112 / 66  (left arm) Cuff size:    regular  Vitals Entered By: Reynaldo Minium CMA (November 19, 2010 11:54 AM)  O2 Flow:  Room air CC: 72month follow up visit-discuss Xolair shots   Physical Exam  Additional Exam:  General: A/Ox3; pleasant and cooperative, NAD, SKIN: no rash, lesions NODES: no lymphadenopathy HEENT: [place]/AT, EOM- WNL, Conjuctivae- clear, PERRLA, TM-WNL, Nose- clear- no polyps seen, no drainage seen, Throat- clear and wnl, not red, Mallampati  III-IV NECK: Supple w/ fair ROM, JVD- none, normal carotid impulses w/o bruits Thyroid-  CHEST:unlabored without wheeze or cough now HEART: RRR, no m/g/r heard ABDOMEN: Soft and nl;  ZOX:WRUE, nl pulses, no edema  NEURO: Grossly intact to observation      Impression & Recommendations:  Problem # 1:  ASTHMA, STEROID DEPENDENT (ICD-493.10) Today is a good day,  but she has needed a lot of prednisone over time and after discussion, she thinks she would like to try [place]..   Problem # 2:  ALLERGIC RHINITIS (ICD-477.9) Rhinitis currently is controlled, w/o recurrence of nasal polyps. Her updated medication list for this problem includes:    Veramyst 27.5 Mcg/spray Susp (Fluticasone furoate) .[name]... 1 sparya each nostril once daily    Nasacort Aq 55 Mcg/act Aers (Triamcinolone acetonide(nasal)) .[name]... Take 2 sprays in each nostril once a day as needed  Medications Added to Medication List This Visit: 1)  Xolair 150 Mg Solr (Omalizumab) .... 300 mg [place] every 4 weeks  Other Orders: Est. Patient Level III (45409) Pulmonary Referral (Pulmonary)  Patient Instructions: 1)  Please schedule a follow-up appointment in 3 months. 2)  We will start Xolair application process and will be in touch with you as you decide whether you want to try it.  3)  See PCC to start Xolair application Prescriptions: XOLAIR 150 MG SOLR (OMALIZUMAB) 300 mg [place] every 4 weeks  #300 mg x prn   Entered and Authorized by:   Waymon Budge MD   Signed by:   Waymon Budge MD on 11/19/2010    Method used:   Historical   RxID:   [id number]

## 2011-01-21 NOTE — Miscellaneous (Signed)
Summary: Xolair Injection Program  Xolair Injection Program   Imported By: Lester [place] 12/16/2010 10:01:55  _____________________________________________________________________  External Attachment:    Type:   Image     Comment:   External Document

## 2011-01-21 NOTE — Assessment & Plan Note (Signed)
Summary: 1ST [name] INJECTION/OK'D BY KATIE/RJC   Primary Provider/Referring Provider:  Merla Riches  CC:  Follow up appt.  1st xolair injection.  History of Present Illness: October 07, 2010 -Asthma/ COPD- steroid dependent, nasal polyps, allergic rhinitis Nurse CC 1 month followup, c/o wheezing a lot, some sob runny nose, no coughing She has prednisone and is using Dulera now instead of Symbicort. Last week began increased wheeze, blaming the weather, and started prednisone taper now down to 20 mg daiy. Likes the [place] and thinks it helps. This is the first prednisone since last month. Yellow from sinuses so she suspects sinus infection, but no headache or fever. We discussed Xolair.   November 19, 2010- -Asthma/ COPD- steroid dependent, nasal polyps, allergic rhinitis CC- 49month follow up visit-discuss Xolair shots Doing a lot better since last here, except she needed one prednisone taper ending 2 weeks ago.  She wanted to talk again about possibly trying Xolair. We had a long talk about method, logistics, risks and realistic expectations of Xolair and gave handout.   December 10, 2010- -Asthma/ COPD- steroid dependent, nasal polyps, allergic rhinitis Nurse-CC: Follow up appt.  1st xolair injection Here for initial xoalir injection, feeling a little wheezey, attributed to unusually warm weather. Denies flu or cold- no sputum, fever, chest pain or palpitation.     Asthma History    Asthma Control Assessment:    Age range: 12+ years    Symptoms: >2 days/week    Nighttime Awakenings: 0-2/month    Interferes w/ normal activity: no limitations    SABA use (not for EIB): >2 days/week    Asthma Control Assessment: Not Well Controlled   Preventive Screening-Counseling & Management  Alcohol-Tobacco     Smoking Status: never  Medications Prior to Update: 1)  Duoneb 2.5-0.5 Mg/65ml  Soln (Albuterol-Ipratropium) .... As Needed 2)  Proair Hfa 108 (90 Base) Mcg/act Aers (Albuterol Sulfate)  .... 2 Puffs Four Times A Day As Needed Rescue Inhaler 3)  Veramyst 27.5 Mcg/spray  Susp (Fluticasone Furoate) .[name].. 1 Sparya Each Nostril Once Daily 4)  Nasacort Aq 55 Mcg/act  Aers (Triamcinolone Acetonide(Nasal)) .... Take 2 Sprays in Each Nostril Once A Day As Needed 5)  Dulera 200-5 Mcg/act Aero (Mometasone Furo-Formoterol Fum) .... 2 Puffs and Rinse Mouth, Twice Daily 6)  Xolair 150 Mg Solr (Omalizumab) .... 300 Mg [place] Every 4 Weeks 7)  Epipen 2-Pak 0.3 Mg/0.1ml Devi (Epinephrine) .... Use As Directed As Needed For Severe Allergic Reaction  Allergies (verified): 1)  ! Asa 2)  ! 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  The patient denies shortness of breath at rest, productive cough, coughing up blood, chest pain, irregular heartbeats, acid heartburn, indigestion, loss of appetite, weight change, abdominal pain, difficulty swallowing, sore throat, tooth/dental problems, headaches, nasal congestion/difficulty breathing through nose, sneezing, and itching.    Vital Signs:  Patient  profile:   45 year old female Height:      67 inches Weight:      197.50 pounds BMI:     31.04 O2 Sat:      97 % on Room air Pulse rate:   82 / minute BP sitting:   112 / 64  (left arm) Cuff size:   regular  Vitals Entered By: Arman Filter  LPN (December 10, 2010 9:34 AM)  O2 Flow:  Room air CC: Follow up appt.  1st xolair injection Comments Medications reviewed with patient Arman Filter LPN  December 10, 2010 9:38 AM    Physical Exam  Additional Exam:  General: A/Ox3; pleasant and cooperative, NAD, SKIN: no rash, lesions NODES: no lymphadenopathy HEENT: [place]/AT, EOM- WNL, Conjuctivae- clear, PERRLA, TM-WNL, Nose- clear- no polyps seen, no drainage seen, Throat- clear, Mallampati  III-IV NECK: Supple w/ fair ROM, JVD- none, normal carotid impulses w/o bruits Thyroid-  CHEST:unlabored without wheeze or cough now,             although she reports feeling a littel tighter today HEART: RRR, no m/g/r heard ABDOMEN: Soft and nl;  ZOX:WRUE, nl pulses, no edema  NEURO: Grossly intact to observation      Impression & Recommendations:  Problem # 1:  ASTHMA, STEROID DEPENDENT (ICD-493.10) OK for first Xolair injection.  We did discuss Xolair goals and issues again.   Other Orders: Est. Patient Level I (45409) Administration xolair injection [phone])  Patient Instructions: 1)  Please schedule a follow-up appointment in 4 months. 2)  OK to start Xolair today   Immunization History:  Influenza Immunization History:    Influenza:  historical (08/20/2010)  Pneumovax Immunization History:    Pneumovax:  historical (09/19/2009)     Medication Administration  Injection # 1:    Medication: Xolair (omalizumab) 150mg     Diagnosis: ASTHMA, STEROID DEPENDENT (ICD-493.10)    Route: SQ    Site: R thigh    Exp Date: 12/2013    Lot #: [id number]    Mfr: Salome Spotted    Comments: 1.2 ML IN RIGHT HIP 150MG  CHARGED [id number]    Patient tolerated injection without complications    Given by: TAMMY SCOTT IN ALLERGY LAB  Orders Added: 1)  Est. Patient Level I [62130] 2)  Administration xolair injection [[id number]]

## 2011-01-21 NOTE — Letter (Signed)
Summary: Xolair[facility]  [facility]   Imported By: Lester [place] 12/16/2010 10:00:07  _____________________________________________________________________  External Attachment:    Type:   Image     Comment:   External Document

## 2011-01-21 NOTE — Letter (Signed)
Summary: [facility]  [facility]   Imported By: Lester  12/16/2010 10:03:20  _____________________________________________________________________  External Attachment:    Type:   Image     Comment:   External Document

## 2011-01-21 NOTE — Assessment & Plan Note (Signed)
Summary: xolair//sh  Nurse Visit   Allergies: 1)  ! Asa 2)  ! Pcn  Medication Administration  Injection # 1:    Medication: Xolair (omalizumab) 150mg     Diagnosis: ASTHMA, STEROID DEPENDENT (ICD-493.10)    Route: SQ    Site: RUOQ gluteus    Exp Date: 12/2013    Lot #: [id number]    Mfr: Salome Spotted    Comments: 1.2 ML IN RIGHT HIP 150 MG CHARGED 96401    Given by: Dimas Millin IN [facility]ERGY LAB  Orders Added: 1)  Administration xolair injection [[id number]]   Medication Administration  Injection # 1:    Medication: Xolair (omalizumab) 150mg     Diagnosis: ASTHMA, STEROID DEPENDENT (ICD-493.10)    Route: SQ    Site: RUOQ gluteus    Exp Date: 12/2013    Lot #: [id number]    Mfr: Salome Spotted    Comments: 1.2 ML IN RIGHT HIP 150 MG CHARGED 96401    Given by: Dimas Millin IN [facility]ERGY LAB  Orders Added: 1)  Administration xolair injection [[id number]]

## 2011-01-21 NOTE — Progress Notes (Signed)
Summary: Xolair  Phone Note Call from Patient Call back at Home Phone [phone]   Caller: Patient Summary of Call: Ms [name]-Sides phoned and asked to speak to [facility] regarding Xolair. She would like for Bjorn Loser to call her back at [phone]. Initial call taken by: Vedia Coffer,  December 08, 2010 1:47 PM  Follow-up for Phone Call        Pt has approved shipment of Xolair, which is due to arrive Wed 12/09/10. Pt has been scheduled for her 1st xolair injection on Thurs. 12/10/10 at 9:00 on Dr. Roxy Cedar schedule. Pt is aware that she must wait 2 hours for 1st injection.   Please call in Epi Pen 0.3 mg Epinephrine. Use as directed as needed severe allergic reaction to [facility] on [address]. Alfonso Ramus  December 08, 2010 3:54 PM   Additional Follow-up for Phone Call Additional follow up Details #1::        Called, spoke with pt.  She is aware epipen rx sent to [facility] and bring with her to 1st xolair inj appt.  She verbalized understanding of this Additional Follow-up by: Gweneth Dimitri RN,  December 08, 2010 3:59 PM    New/Updated Medications: EPIPEN 2-PAK 0.3 MG/0.3ML DEVI (EPINEPHRINE) use as directed as needed for severe allergic reaction Prescriptions: EPIPEN 2-PAK 0.3 MG/0.3ML DEVI (EPINEPHRINE) use as directed as needed for severe allergic reaction  #1 x 1   Entered by:   Gweneth Dimitri RN   Authorized by:   Waymon Budge MD   Signed by:   Gweneth Dimitri RN on 12/08/2010   Method used:   Electronically to        [facility] [address] (retail)       [address]       [place], [place]  [address]       Ph: [phone]       Fax: [phone]   RxID:   [id number]

## 2011-02-11 ENCOUNTER — Encounter: Payer: Self-pay | Admitting: Internal Medicine

## 2011-02-11 ENCOUNTER — Telehealth (INDEPENDENT_AMBULATORY_CARE_PROVIDER_SITE_OTHER): Payer: Self-pay | Admitting: *Deleted

## 2011-02-11 ENCOUNTER — Ambulatory Visit (INDEPENDENT_AMBULATORY_CARE_PROVIDER_SITE_OTHER): Payer: BC Managed Care – PPO | Admitting: Internal Medicine

## 2011-02-11 DIAGNOSIS — J45909 Unspecified asthma, uncomplicated: Secondary | ICD-10-CM

## 2011-02-16 NOTE — Assessment & Plan Note (Signed)
Summary: acute visit-asthma flare up//kcw   Primary Provider/Referring Provider:  Merla Riches  CC:  Acute visit-asthma flare up.[name]  History of Present Illness: November 19, 2010- -Asthma/ COPD- steroid dependent, nasal polyps, allergic rhinitis CC- 12month follow up visit-discuss Xolair shots Doing a lot better since last here, except she needed one prednisone taper ending 2 weeks ago.  She wanted to talk again about possibly trying Xolair. We had a long talk about method, logistics, risks and realistic expectations of Xolair and gave handout.   December 10, 2010- -Asthma/ COPD- steroid dependent, nasal polyps, allergic rhinitis Nurse-CC: Follow up appt.  1st xolair injection Here for initial xoalir injection, feeling a little wheezey, attributed to unusually warm weather. Denies flu or cold- no sputum, fever, chest pain or palpitation.  February 11, 2011- -Asthma/ COPD- steroid dependent, nasal polyps, allergic rhinitis Nurse-CC: Acute visit-asthma flare up. Acute- Flare asthma- worse 4 days ago, Denies productive cough or rhinorhea- not a cold. Continues Xolair. Continues prednisone maintenance and started herself on  taper from 40 mg- now at 30 mg.      Asthma History    Asthma Control Assessment:    Age range: 12+ years    Symptoms: >2 days/week    Nighttime Awakenings: 0-2/month    Interferes w/ normal activity: no limitations    SABA use (not for EIB): >2 days/week    Asthma Control Assessment: Not Well Controlled   Preventive Screening-Counseling & Management  Alcohol-Tobacco     Smoking Status: never  Current Medications (verified): 1)  Duoneb 2.5-0.5 Mg/70ml  Soln (Albuterol-Ipratropium) .... As Needed 2)  Proair Hfa 108 (90 Base) Mcg/act Aers (Albuterol Sulfate) .... 2 Puffs Four Times A Day As Needed Rescue Inhaler 3)  Veramyst 27.5 Mcg/spray  Susp (Fluticasone Furoate) .[name].. 1 Sparya Each Nostril Once Daily 4)  Nasacort Aq 55 Mcg/act  Aers (Triamcinolone  Acetonide(Nasal)) .... Take 2 Sprays in Each Nostril Once A Day As Needed 5)  Dulera 200-5 Mcg/act Aero (Mometasone Furo-Formoterol Fum) .... 2 Puffs and Rinse Mouth, Twice Daily 6)  Xolair 150 Mg Solr (Omalizumab) .... 300 Mg Darwin Every 4 Weeks 7)  Epipen 2-Pak 0.3 Mg/0.37ml Devi (Epinephrine) .... Use As Directed As Needed For Severe Allergic Reaction  Allergies (verified): 1)  ! Asa 2)  ! Pcn  Past History:  Past Medical History: Last updated: 12/10/2010 Asthma- steroid dependent              - Xolair start 12/10/10 Nasal polyps Aspirin sensitivity (Sampter's Triad) allergic rhinitis Deep Vein Thrombosis/Phlebitis- Factor V Leiden  Past Surgical History: Last updated: 02/07/2008 Uterine fibroids Nasal polypectomy  Family History: Last updated: May 30, 2010 Father died MI epilepsy hypertension liver disease Aunt- asthma, died multiple med probs  Social History: Last updated: 2010-05-30 Patient never smoked.  Married Manufacturing engineer.  Risk Factors: Smoking Status: never (02/11/2011)  Review of Systems      See HPI       The patient complains of shortness of breath with activity, non-productive cough, and nasal congestion/difficulty breathing through nose.  The patient denies shortness of breath at rest, productive cough, coughing up blood, chest pain, irregular heartbeats, acid heartburn, indigestion, loss of appetite, weight change, abdominal pain, difficulty swallowing, sore throat, tooth/dental problems, headaches, and sneezing.    Vital Signs:  Patient profile:   45 year old female Height:      67 inches Weight:      199 pounds BMI:     31.28 O2 Sat:  100 % on Room air Pulse rate:   74 / minute BP sitting:   138 / 88  (left arm) Cuff size:   regular  Vitals Entered By: Reynaldo Minium CMA (February 11, 2011 2:43 PM)  O2 Flow:  Room air CC: Acute visit-asthma flare up.   Physical Exam  Additional Exam:  General: A/Ox3; pleasant and cooperative,  NAD, SKIN: no rash, lesions NODES: no lymphadenopathy HEENT: [place]/AT, EOM- WNL, Conjuctivae- clear, PERRLA, TM-WNL, Nose- clear- no polyps seen, no drainage seen, Throat- clear, Mallampati  III-IV NECK: Supple w/ fair ROM, JVD- none, normal carotid impulses w/o bruits Thyroid-  CHEST:unlabored  with wheeze left> right chest          HEART: RRR, no m/g/r heard ABDOMEN: Soft and nl;  EAV:WUJW, nl pulses, no edema  NEURO: Grossly intact to observation      Impression & Recommendations:  Problem # 1:  ASTHMA, STEROID DEPENDENT (ICD-493.10) Acute nonspecific exacerbation. We will give neb and depo here, then let her try occasional use of albuterol tabs as discussed. I gave her a range to work with on prednisone management.   Medications Added to Medication List This Visit: 1)  Albuterol Sulfate 2 Mg Tabs (Albuterol sulfate) .[name].. 1 two times a day as needed asthma 2)  Prednisone 10 Mg Tabs (Prednisone) .[name].. 1 daily or as directed  Other Orders: Est. Patient Level III (11914) Administration xolair injection [phone]) Admin of Therapeutic Inj  intramuscular or subcutaneous (62130) Nebulizer Tx (86578) Albuterol Sulfate Sol 1mg  unit dose (I6962)  Patient Instructions: 1)  Keep scheduled appointment- earlier as needed 2)  Neb a 3)  depo 80- 4)  scripts for albuterol tablets and for prednisone were sent Prescriptions: PREDNISONE 10 MG TABS (PREDNISONE) 1 daily or as directed  #100 x 1   Entered and Authorized by:   Waymon Budge MD   Signed by:   Waymon Budge MD on 02/11/2011   Method used:   Electronically to        [facility] [address] (retail)       [address]       [place], [place]  [address]       Ph: [phone]       Fax: [phone]   RxID:   [id number] ALBUTEROL SULFATE 2 MG TABS (ALBUTEROL SULFATE) 1 two times a day as needed asthma  #30 x prn   Entered and Authorized by:   Waymon Budge MD   Signed by:   Waymon Budge MD on  02/11/2011   Method used:   Electronically to        [facility] [address] (retail)       [address]       [place], [place]  [address]       Ph: [phone]       Fax: [phone]   RxID:   [id number]      Medication Administration  Injection # 1:    Medication: Xolair (omalizumab) 150mg     Diagnosis: ASTHMA, STEROID DEPENDENT (ICD-493.10)    Route: SQ    Site: L thigh    Exp Date: 02/2014    Lot #: [id number]    Mfr: Salome Spotted    Comments: 1.2 ML IN LEFT HIP 150MG  CHARGED 96401    Given by: Dimas Millin IN ALLERGY LAB  Injection # 2:  Medication: Depo- Medrol 80mg     Diagnosis: ASTHMA, STEROID DEPENDENT (ICD-493.10)    Route: SQ    Site: RUOQ gluteus    Exp Date: 06/2013    Lot #: [id number]    Mfr: Pharmacia    Patient tolerated injection without complications    Given by: Reynaldo Minium CMA (February 11, 2011 4:33 PM)  Medication # 1:    Medication: Albuterol Sulfate Sol 1mg  unit dose    Diagnosis: ASTHMA, STEROID DEPENDENT (ICD-493.10)    Dose: 1 vial    Route: inhaled    Exp Date: 01/2012    Lot #: [id number]    Mfr: nephron    Patient tolerated medication without complications    Given by: Reynaldo Minium CMA (February 11, 2011 4:34 PM)  Orders Added: 1)  Est. Patient Level III [99213] 2)  Administration xolair injection [04540] 3)  Admin of Therapeutic Inj  intramuscular or subcutaneous [96372] 4)  Nebulizer Tx [94640] 5)  Albuterol Sulfate Sol 1mg  unit dose [J8119]   Medication Administration  Injection # 1:    Medication: Xolair (omalizumab) 150mg     Diagnosis: ASTHMA, STEROID DEPENDENT (ICD-493.10)    Route: SQ    Site: L thigh    Exp Date: 02/2014    Lot #: [id number]    Mfr: Genetech    Comments: 1.2 ML IN LEFT HIP 150MG  CHARGED 96401    Given by: Dimas Millin IN ALLERGY LAB  Injection # 2:    Medication: Depo- Medrol 80mg     Diagnosis: ASTHMA, STEROID DEPENDENT (ICD-493.10)    Route: SQ    Site: RUOQ gluteus    Exp Date:  06/2013    Lot #: [id number]    Mfr: Pharmacia    Patient tolerated injection without complications    Given by: Reynaldo Minium CMA (February 11, 2011 4:33 PM)  Medication # 1:    Medication: Albuterol Sulfate Sol 1mg  unit dose    Diagnosis: ASTHMA, STEROID DEPENDENT (ICD-493.10)    Dose: 1 vial    Route: inhaled    Exp Date: 01/2012    Lot #: [id number]    Mfr: nephron    Patient tolerated medication without complications    Given by: Reynaldo Minium CMA (February 11, 2011 4:34 PM)  Orders Added: 1)  Est. Patient Level III [86578] 2)  Administration xolair injection [46962] 3)  Admin of Therapeutic Inj  intramuscular or subcutaneous [96372] 4)  Nebulizer Tx [[address]] 5)  Albuterol Sulfate Sol 1mg  unit dose [X3244]

## 2011-02-16 NOTE — Progress Notes (Signed)
Summary: asthma- req to see dr young  Phone Note Call from Patient Call back at [facility] Phone [phone]   Caller: Patient Call For: young Summary of Call: pt requests to see dr young today or tomorrow re: asthma.  Initial call taken by: Tivis Ringer, CNA,  February 11, 2011 9:59 AM  Follow-up for Phone Call        called and spoke with pt.  pt states she is having an "asthma flare up." c/o wheezing and tightness in chest since Tuesday 02/09/2011.   Pt states she has had to use duoneb two times a day to help with symptoms.  pt states she is also currently on a pred taper.  pt denies a cough. pt requesting to be seen today for this.  No appts avail. Please advise.  Thanks.  Aundra Millet Reynolds LPN  February 11, 2011 10:29 AM  allergies: asa, pcn   Additional Follow-up for Phone Call Additional follow up Details #1::        Per CDY-we worked pt in to see CDY at 2pm.Katie [facility] CMA  February 11, 2011 12:17 PM    Pt is aware of the appt.Reynaldo Minium CMA  February 11, 2011 12:17 PM

## 2011-03-05 ENCOUNTER — Ambulatory Visit: Payer: Self-pay | Admitting: Internal Medicine

## 2011-03-05 NOTE — Op Note (Signed)
  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  POSTOPERATIVE DIAGNOSES:  Right proximal tubal occlusions, plus submucosal myoma, plus intrauterine adhesions.  PROCEDURES:  Hysteroscopy, bilateral tubal catheterization, intraoperative hysterosalpingogram, lysis of adhesions, myomectomy (sharp).  SURGEON:  Fermin Schwab, MD  ANESTHESIA:  General.  COMPLICATIONS:  None.  BLOOD LOSS:  Minimal.  SPECIMENS:  Submucosal myoma to Pathology.  FINDINGS:  On hysteroscopy, there were endocervical nabothian cysts in the canal.  The endometrial cavity was overall enlarged due to residual myomas.  There was blunting of the left lateral wall, status post previous myomectomy (open).  Both tubal ostia were visualized and they appeared normal.  There were fundal marginal adhesions that required cutting for about 0.5 cm.  There was a 1-cm type II myoma in the fundus medial and anterior to the left tubal ostium.  Then, selective salpingotomy was done on the right after placement of the [facility] catheter, fluoroscopy revealed patency of the right tube.  It was repeated for the left tube similarly the left tubal opacification was confirmed.  DESCRIPTION OF THE PROCEDURE:  The patient was placed in dorsal supine position and general anesthesia was administered.  She was placed in lithotomy position and prepped and draped for hysteroscopy in sterile manner.  A 0.4 units/mL solution of vasopressin was injected into the cervix.  Standard hysteroscope was used in saline distention method, hysteroscopic  fluid management system at 80 mmHg of pressure.  Above findings were noted.  First the right tube was wedged with a Novy catheter and selective salpingography was performed.  A tubal catheter was try to be advanced further into the right tube but, because of the angulation, it could not be advanced any further.  It was sufficient to know that the tube which was opacified with a selective salpingography. The same step was repeated for the left tube with opacification of the left tube.  Next, intraoperative HSG was terminated and hysteroscopic scissors were used to excise the submucosal myoma.  The fundal adhesions were also cut.  Hemostasis was ensured.  Fluid balance was 875 mL of deficit of normal saline.  Instrument count was correct. The patient tolerated the procedure well and was transferred to recovery room in satisfactory condition.     Fermin Schwab, MD     TY/MEDQ  D:  12/30/2010  T:  12/31/2010  Job:  [id number]  cc:   Carrington Clamp, M.D. Fax: [phone]  Electronically Signed by Fermin Schwab MD on 03/05/2011 05:27:55 PM

## 2011-03-11 ENCOUNTER — Ambulatory Visit (INDEPENDENT_AMBULATORY_CARE_PROVIDER_SITE_OTHER): Payer: BC Managed Care – PPO | Admitting: Internal Medicine

## 2011-03-11 DIAGNOSIS — J45909 Unspecified asthma, uncomplicated: Secondary | ICD-10-CM

## 2011-03-11 MED ORDER — OMALIZUMAB 150 MG ~~LOC~~ SOLR
150.0000 mg | Freq: Once | SUBCUTANEOUS | Status: AC
  Administered 2011-03-11: 150 mg via SUBCUTANEOUS

## 2011-03-25 ENCOUNTER — Telehealth: Payer: Self-pay | Admitting: Internal Medicine

## 2011-03-25 NOTE — Telephone Encounter (Signed)
Pt calling to inform CDY that she is approx [redacted] weeks pregnant. She wants to know if any of her medications for her asthma should be changed or stopped. Pt's current med list includes:  nebs, dulera, proair, on xolair, veramyst, nasacort and prednisone. Pls advise. ALLERGIES: ASA, PCN

## 2011-03-29 NOTE — Telephone Encounter (Signed)
The first rule is that "if Momma can't breathe, then baby can't breathe", so we do what it takes. We would rather keep her stable than have her put off seeking help until it takes high doses to get her back in control.  We would have the most concern about systemic steroids, like prednisone. I would like her to talk with her OBGYN about that one.

## 2011-03-29 NOTE — Telephone Encounter (Signed)
Attempt to call pt and was hung up on twice. Will try back later

## 2011-03-30 NOTE — Telephone Encounter (Signed)
lmomtcb x1 

## 2011-03-31 ENCOUNTER — Telehealth: Payer: Self-pay | Admitting: Internal Medicine

## 2011-03-31 NOTE — Telephone Encounter (Signed)
Pt called back and she states she has had 1 U/S and they are concerned because they did not detect a heart beat w/ the baby. She is scheduled for another U/S on Mon., 04/05/2011 and is scheduled to see CDY  This Fri., 04/02/2011. Pt states her OB/GYN is Dr. [name], [phone].

## 2011-03-31 NOTE — Telephone Encounter (Signed)
This is a duplicate msg regarding the same thing. See open phone note from 03/25/2011. Will close this phone note and continue to document in previous msg.

## 2011-03-31 NOTE — Telephone Encounter (Signed)
lmomtcb x 2  

## 2011-04-01 ENCOUNTER — Encounter: Payer: Self-pay | Admitting: Internal Medicine

## 2011-04-01 NOTE — Telephone Encounter (Signed)
Noted  

## 2011-04-02 ENCOUNTER — Encounter: Payer: Self-pay | Admitting: Internal Medicine

## 2011-04-02 ENCOUNTER — Ambulatory Visit (INDEPENDENT_AMBULATORY_CARE_PROVIDER_SITE_OTHER): Payer: BC Managed Care – PPO | Admitting: Internal Medicine

## 2011-04-02 VITALS — BP 124/82 | HR 94 | Ht 67.0 in | Wt 206.0 lb

## 2011-04-02 DIAGNOSIS — Z8672 Personal history of thrombophlebitis: Secondary | ICD-10-CM

## 2011-04-02 DIAGNOSIS — J45909 Unspecified asthma, uncomplicated: Secondary | ICD-10-CM

## 2011-04-02 DIAGNOSIS — J309 Allergic rhinitis, unspecified: Secondary | ICD-10-CM

## 2011-04-02 MED ORDER — PREDNISONE 10 MG PO TABS: 10.0000 mg | ORAL_TABLET | Freq: Every day | ORAL | Status: DC

## 2011-04-02 NOTE — Patient Instructions (Signed)
Continue present meds. Please do discuss your medications with your obstetrician.

## 2011-04-02 NOTE — Assessment & Plan Note (Signed)
Now on prednisone burst for acute exacerbation at 8 weeks pregnancy. Balance of asthma control and medication safety will rtequire judgment, but she understands that if mother can't breathe, then baby can't breathe.

## 2011-04-02 NOTE — Telephone Encounter (Signed)
I spoke with patient today at OV. Phone call complete.

## 2011-04-02 NOTE — Progress Notes (Signed)
  Subjective:    Patient ID: [name], female    DOB: 06/18/1966, 45 y.o.   MRN: [id number]  HPI 45 yo F followed for severe asthma requiring frequent steroids in past, chronic fixed obstruction, hx nasal polyps, allergic rhinitis, hx DVT.[name] Now first pregnancy with concern for complications, started on heparin.  Asthma control not good- blames weather, pollen. Has  Been on daily prednisone and started herself on a burst yesterday from 40 mg daily. Denies fever or infection. Continues Xolair, Dulera, and usually just 10 mg prednisone daily. We discussed asthma meds and pregnancy, looked up Xolair in [employer] for review with her, and asked her to discus with her high risk pregnancy physicians.   Review of Systems See HPI Constitutional:   No weight loss, night sweats,  Fevers, chills, fatigue, lassitude. HEENT:   No headaches,  Difficulty swallowing,  Tooth/dental problems,  Sore throat,                No sneezing, itching, ear ache,, post nasal drip. Some nasal congestion.  CV:  No chest pain,  Orthopnea, PND, swelling in lower extremities, anasarca, dizziness, palpitations  GI  No heartburn, indigestion, abdominal pain, nausea, vomiting, diarrhea, change in bowel habits, loss of appetite  Resp:Usually someshortness of breath with exertion, not at rest.  No excess mucus, no productive cough,  No non-productive cough,  No coughing up of blood.  No change in color of mucus.  No wheezing.  No chest wall deformity  Skin: no rash or lesions.  GU: no dysuria, change in color of urine, no urgency or frequency.  No flank pain.  MS:  No joint pain or swelling.  No decreased range of motion.  No back pain.  Psych:  No change in mood or affect. No depression or anxiety.  No memory loss.      Objective:   Physical Exam General- Alert, Oriented, Affect-appropriate, Distress- none acute  Relaxed and conversational now.   Skin- rash-none, lesions- none, excoriation-  none  Lymphadenopathy- none  Head- atraumatic  Eyes- Gross vision intact, PERRLA, conjunctivae clear secretions  Ears- Hearing normal- canals, Tm L ,   R ,  Nose- Clear, No-septal dev, mucus, polyps, erosion, perforation   Throat- Mallampati II , mucosa clear , drainage- none, tonsils- atrophic  Neck- flexible , trachea midline, no stridor , thyroid nl, carotid no bruit  Chest - symmetrical excursion , unlabored     Heart/CV- RRR , no murmur , no gallop  , no rub, nl s1 s2                     - JVD- none , edema- none, stasis changes- none, varices- none     Lung- clear to P&A, but diminished, wheeze-trace only at right scapula, cough- none , dullness-none, rub- none     Chest wall- Abd- tender-no, distended-no, bowel sounds-present, HSM- no  Br/ Gen/ Rectal- Not done, not indicated  Extrem- cyanosis- none, clubbing, none, atrophy- none, strength- nl  Neuro- grossly intact to observation         Assessment & Plan:

## 2011-04-06 ENCOUNTER — Encounter: Payer: Self-pay | Admitting: Internal Medicine

## 2011-04-06 NOTE — Assessment & Plan Note (Signed)
No recurrence at this time. Precautions reviewed.

## 2011-04-06 NOTE — Assessment & Plan Note (Signed)
Some stuffiness w/o visible polyps. Discussed rhinitis of pregnancy vs allergic rhinitis.

## 2011-04-16 ENCOUNTER — Ambulatory Visit (INDEPENDENT_AMBULATORY_CARE_PROVIDER_SITE_OTHER): Payer: BC Managed Care – PPO

## 2011-04-16 DIAGNOSIS — J45909 Unspecified asthma, uncomplicated: Secondary | ICD-10-CM

## 2011-04-16 MED ORDER — OMALIZUMAB 150 MG ~~LOC~~ SOLR
150.0000 mg | Freq: Once | SUBCUTANEOUS | Status: AC
  Administered 2011-04-16: 150 mg via SUBCUTANEOUS

## 2011-04-30 ENCOUNTER — Telehealth: Payer: Self-pay | Admitting: Internal Medicine

## 2011-04-30 NOTE — Telephone Encounter (Signed)
Spoke with patient-personal issues. Phone call complete.

## 2011-04-30 NOTE — Telephone Encounter (Signed)
Called, spoke with pt.  She is requesting to speak to [facility].  I advised Florentina Addison is with pts but I would be more than glad to help.  Pt states she would rather wait and talk with Florentina Addison and did not want to tell me what this was regarding.  Katie, will you pls call pt.  Thanks!

## 2011-05-03 ENCOUNTER — Telehealth: Payer: Self-pay | Admitting: Internal Medicine

## 2011-05-03 MED ORDER — AZITHROMYCIN 250 MG PO TABS: ORAL_TABLET | ORAL | Status: AC

## 2011-05-03 NOTE — Telephone Encounter (Signed)
Pt would like to have Zpak called in.    Per CY-okay to call in Zpak #1 take as directed no refills.  I called the patient and she is aware we are calling in Zpak to [facility] on [address] as requested.  I has to leave Rx on voicemail at pharmacy as they were still closed at this time. Vivianne Spence

## 2011-05-04 NOTE — Assessment & Plan Note (Signed)
[facility]                             PULMONARY OFFICE NOTE   NAME:[name]                        MRN:          [id number]  DATE:08/24/2007                            DOB:          [birth year]    PROBLEM:  1. Chronic steroid dependent asthma.  2. Allergic rhinitis.  3. History deep vein thrombosis/Coumadin/factor 5 Leiden positive.  4. Nasal polyposis.   ICD-9 CLASSIFICATION:  Persistent rhinorrhea.  She is planning surgery  with Dr. Lazarus Salines for polyps.  She took prednisone two weeks ago, tapered  off, and it did help.  She feels need to keep some on hand and we  discussed this yet again.  She is very experienced with steroids and has  been dependent much of the last several years for her rhinitis and her  asthma.  She currently feels her asthma control is pretty good.   MEDICATIONS:  1. Zyrtec 10 mg.  2. Coumadin.  3. Symbicort 160/4.5.  4. Albuterol rescue inhaler.  5. Home nebulizer with DuoNeb.  6. Prednisone 5 mg used in short tapers p.r.n.   DRUG INTOLERANCES:  ASPIRIN WITH TRIAD OF ASTHMA, ASPIRIN SENSITIVITY  AND NASAL POLYPS, PENICILLIN RASH.   OBJECTIVE:  Weight 191 pounds, pulse regular and 78, room air saturation  98%, moderate nasal congestion, vitally cushingoid.  CHEST:  Clear.  HEART:  Heart sounds regular and normal.   IMPRESSION:  1. Rhinitis with polyps.  2. Asthma/chronic obstructive pulmonary disease.  3. Aspirin triad.  4. History deep vein thrombosis on chronic Coumadin.   PLAN:  1. Try Veramyst one spray each nostril daily.  2. Refill prednisone 5 mg to try one every other day p.r.n. on a      maintenance basis.  3. Chest x-ray.  4. Schedule return in three months.   ADDENDUM:  We were called by radiology.  The patient's cycle is late so  they have done a fielded chest x-ray.  Pulmonary function tests on  September 4 show mild restriction of total lung capacity, mild  obstructive airways disease with  insignificant response to  bronchodilator.  Her FEG-1 was 2.25 L (71%) and FED-1/FDC ratio was  0.67, diffusion 76% of predicted.     Clinton D. Maple Hudson, MD, Tonny Bollman, FACP  Electronically Signed    CDY/MedQ  DD: 08/27/2007  DT: 08/28/2007  Job #: [id number]   cc:   Harrel Lemon. Merla Riches, M.D.

## 2011-05-07 NOTE — Assessment & Plan Note (Signed)
[facility]                             PULMONARY OFFICE NOTE   NAME:[name], [name]                        MRN:          [id number]  DATE:01/09/2007                            DOB:          [birth year]    PROBLEM:  1. Chronic steroid-dependent asthma.  2. Allergic rhinitis.  3. History of deep vein thrombosis/Coumadin/factor-5 Leiden positive.  4. Nasal polyposis.   HISTORY:  She really likes Symbicort and asks a refill sample saying it  has done better than anything she has had in a long time to keep her  asthma under control.  The main complaint today is of persistent nasal  congestion and watery rhinorrhea with clear discharge and sneezing.  Allegra D does not control it.  No headache.  Occasional trace yellow.   MEDICATIONS:  She has been able to be off of her maintenance prednisone  for several weeks.  Coumadin, Symbicort 160/4.5 b.i.d., Allegra D,  rescue albuterol inhaler, home nebulizer with DuoNeb.   DRUG INTOLERANT:  ASPIRIN and of PENICILLIN with rash.   OBJECTIVE:  Weight 188 pounds.  BP 122/70, pulse regular 100, room air  saturation 97%.  There is a significant right nasal polyp.  I do not see  any on the left but there is some turbinate edema and her septum is  somewhat deviated to the left.  Nasal mucosa is pale with bubbly clear  secretions.  Tympanic membranes are clear.  Chest is clear which is  unusual for her.  Heart sounds are regular and normal.   IMPRESSION:  1. Nasal polyposis.  2. Allergic rhinitis.  3. Asthma is under better control and she is able to be off of her      maintenance prednisone currently.   PLAN:  1. Nasal nebulizer treatment with Neo-Synephrine.  2. Change Allegra D to try sample Astelin nasal spray once or twice      each nostril b.i.d. and add Nasonex one or two sprays each nostril      daily.  3. Schedule pulmonary function tests (spirometry).  4. Schedule return in one month, earlier  p.r.n.     Clinton D. Maple Hudson, MD, Tonny Bollman, FACP  Electronically Signed   CDY/MedQ  DD: 01/09/2007  DT: 01/10/2007  Job #: [id number]

## 2011-05-07 NOTE — Assessment & Plan Note (Signed)
[facility]                               PULMONARY OFFICE NOTE   NAME:[name], [name]                        MRN:          [id number]  DATE:11/09/2006                            DOB:          July 17, 1966    PROBLEMS:  1. Chronic steroid-dependent asthma.  2. Allergic rhinitis.  3. History of deep venous thrombosis/Coumadin/Factor V Leiden positive.  4. Nasal polyposis.   HISTORY:  She was seen by the nurse practitioner on October 5th for a flare  of rhinitis with an upper respiratory infection and treated with  antihistamines, nasal steroids, and a prednisone burst, tapering back to a  maintenance of 5 mg a day until this visit.  She is very pleased with the  way Symbicort 160/4.5 is working and says asthma control is great, but her  sinuses are bothering her.  She continues the prednisone 5 mg daily and  Nasacort AQ but is still bothered by rhinorrhea and nasal congestion.  She  lives alone in [place] or stays here with her mother.  They dust and vacuum  daily.  No animals.  Nonsmoker.  Alavert D previously was insufficient.  She  has travel problems because she has to come here from [place], so she did  not get pulmonary function tests last time.   MEDICATIONS:  1. Prednisone 5 mg daily.  2. Multivitamins.  3. Coumadin.  4. Symbicort 160/4.5.  5. Rescue albuterol inhaler.  6. Home nebulizer with DuoNeb.   Drug intolerance of ASPIRIN and PENICILLIN, which caused rash.   OBJECTIVE:  VITAL SIGNS:  Weight 197 pounds.  BP 116/80.  Pulse regular at  82.  Room air saturation 97%.  GENERAL:  Somewhat overweight and cushingoid.  HEENT:  Moderate nasal congestion.  There is a visible polyp in the right  naris.  LUNGS:  Breath sounds are diminished but clear.  HEART:  Heart sounds are regular without murmur or gallop.   IMPRESSION:  Allergic rhinitis, nasal polyposis, chronic steroid-dependent  asthma.  Note that we have addressed steroid therapy  and her bones before.  She had come off of Fosamax.   PLAN:  1. Sample trial Allegra-D 12 hour, 1 b.i.d. p.r.n.  2. Reduce prednisone to 5 mg every other day for a week and then quit.  3. Keep scheduled appointment here in 2-3 months, planning spirometry      before and after bronchodilator later at that visit.  We will review      bone therapies again at that visit.     Clinton D. Maple Hudson, MD, Tonny Bollman, FACP  Electronically Signed    CDY/MedQ  DD: 11/09/2006  DT: 11/09/2006  Job #: [id number]

## 2011-05-07 NOTE — Assessment & Plan Note (Signed)
[facility]                               PULMONARY OFFICE NOTE   NAME:[name], [name]                        MRN:          [id number]  DATE:09/23/2006                            DOB:          01/09/1966    HISTORY OF PRESENT ILLNESS:  Patient is a pleasant female patient of Dr.  Roxy Cedar with a known history of chronic steroid-dependent asthma and  allergic rhinitis who presents for an acute office visit today complaining  of increased nasal congestion and postnasal drip.  Patient was seen  approximately one month ago.  At that time changed over to Symbicort.  Patient reports that she was doing exceptionally well without any increased  wheezing and no albuterol use.  In fact, patient had totally tapered off of  her prednisone altogether.  Patient denies any purulent sputum, chest pain,  fever, orthopnea, PND, or increased leg swelling.  Patient has recently  finished a Z-pack approximately one week ago and reports all purulent sputum  has resolved.   PAST MEDICAL HISTORY:  Reviewed.   CURRENT MEDICATIONS:  Reviewed.   PHYSICAL EXAMINATION:  GENERAL:  Patient is a pleasant female in no acute  distress.  She is afebrile with stable vital signs.  VITAL SIGNS:  O2 saturation is 96% on room air.  HEENT:  Nasal mucosa is erythematous.  Nontender sinuses.  Posterior pharynx  is clear.  NECK:  Supple without adenopathy.  LUNGS:  Lung sounds are clear without any wheezing.  No crackles.  CARDIAC:  Regular rate and rhythm.  ABDOMEN:  Soft, nontender.  EXTREMITIES:  Warm without edema.   IMPRESSION/PLAN:  Rhinitis flare and upper respiratory infection:  Patient  is to begin Claritin daily, Nasacort AQ 2 puffs twice daily.  Restart  prednisone at 20 mg x2 days, decreasing by 10 mg every 2 days, and hold at 5  mg daily until seen back with Dr. Maple Hudson in two weeks or sooner if needed.     Rubye Oaks, NP  Electronically Signed    TP/MedQ  DD:  10/21/2006  DT: 10/22/2006  Job #: [id number]

## 2011-05-07 NOTE — Assessment & Plan Note (Signed)
[facility]                             PULMONARY OFFICE NOTE   NAME:HAILEYAubry, Rankin                        MRN:          [id number]  DATE:02/07/2007                            DOB:          1966/03/24    PROBLEM:  1. Chronic steroid dependent asthma.  2. Allergic rhinitis.  3. History of deep vein thrombosis/Coumadin/factor V Leiden positive.  4. Nasal polyposis.   HISTORY:  She returns a scheduled, complaining now of nasal congestion  and watery nasal discharge with some pressure sensation in the sinuses,  but no headache, nothing purulent or bloody, and no fever.  Allegra D  has not helped much, and over-the-counter Zyrtec works a little better.  No help recognized with Astelin or Nasonex.  If she blows her nose hard  it makes her dizzy.  We had talked briefly before about surgery as an  option for assessment of nasal polyps, if we could not shrink them back  with the nasal steroid, and she wants to talk more about that now.  Her  asthma has been fairly well controlled recently.   MEDICATIONS:  1. Zyrtec.  2. Vitamins.  3. Coumadin.  4. Symbicort 160/4.5.  5. Albuterol p.r.n.  6. Home nebulizer with DuoNeb.  7. Prednisone most days in the winter at 5 mg to 10 mg.   OBJECTIVE:  Weight 194 pounds.  Bp 126/82.  Pulse regular at 72.  Room  air saturation 97%.  Mildly cushingoid appearance.  I believe I am seeing a large nasal polyp  in the right nostril.  On the left, is watery clear mucus and some  turbinate edema.  CHEST:  Today is quiet, but clear.  HEART:  Sounds are regular without murmur.   IMPRESSION:  1. Nasal polyposis.  2. History of aspirin of aspirin intolerance.  3. Allergic rhinitis.  4. Chronic steroid dependent asthma.   PLAN:  I have again asked her to schedule a pulmonary function test that  she has not gotten around to.  Today, she is given nasal Neo-Synephrine  inhalation and Depo-Medrol 80 mg IM  with reminder of  the steroid side-effect issues.  We are scheduling ENT  evaluation for nasal obstruction and nasal polyposis.  Schedule return  to me in 3 months, earlier p.r.n.     Clinton D. Maple Hudson, MD, Tonny Bollman, FACP  Electronically Signed    CDY/MedQ  DD: 02/07/2007  DT: 02/08/2007  Job #: [id number]   cc:   Harrel Lemon. Merla Riches, M.D.

## 2011-05-07 NOTE — Assessment & Plan Note (Signed)
[facility]                               PULMONARY OFFICE NOTE   NAME:[name], [name]                        MRN:          [id number]  DATE:07/04/2006                            DOB:          22-Jun-1966    PROBLEM LIST:  1.  Chronic steroid-dependent asthma.  2.  Allergic rhinitis.  3.  History of deep venous thrombosis/Coumadin/factor V Leiden positive.  4.  Nasal polyps?   HISTORY:  For the past 3 weeks, [name] says [name] had daily problems with asthma  waking her at night.  [name] is using all of her medications including  prednisone 10 mg daily.  [name] denies nasal congestion, but admits a little  reflux.  [name] has been taking albuterol tablets as well and complains of  fluid retention on steroids, asking for diuretic, which we discussed.   MEDICATIONS:  1.  Prednisone.  2.  Advair 250/50, which [name] says [name] is taking.  3.  Albuterol 4 mg tab b.i.d. p.r.n.  4.  Coumadin.  5.  Albuterol inhaler.  6.  Home nebulizer with albuterol and ipratropium.   DRUG INTOLERANCES:  ASPIRIN and PENICILLIN.   OBJECTIVE:  Weight 191 pounds, BP 142/92, pulse regular at 78, room air  saturation 97%.  Speech quality is nasal and turbinates are edematous, but I  cannot appreciate nasal polyps today and I cannot be sure I was not seeing  just edematous turbinates last time.  There is wheeze bilaterally with quiet  breathing.  Work of breathing is not increased and [name] is conversational.  Heart sounds are regular without murmur or gallop.  There is no neck vein  distention, no peripheral edema.  I do not find cords.   IMPRESSION:  1.  Rhinitis, aspirin intolerance and probable nasal polyposis.  2.  Steroid-dependent asthma/chronic obstructive pulmonary disease with      exacerbation.  3.  Complaint of fluid retention partly from medication.   PLAN:  1.  We are schedule a PFT.  2.  Xopenex nebulizer treatment today, 1.25 mg.  3.  Generic Medrol 8 mg to taper from 32  mg daily over 8 days, then dropping      back to her maintenance prednisone 10 mg daily.  4.  Hydrochlorothiazide 25 mg for p.r.n. use.  When [name] takes one, [name] will      also take an over-      the-counter potassium with magnesium tablet.  5.  Scheduled return in 2 months, earlier p.r.n.                                   Clinton D. Maple Hudson, MD, FCCP, FACP   CDY/MedQ  DD:  07/07/2006  DT:  07/08/2006  Job #:  [id number]

## 2011-05-07 NOTE — Assessment & Plan Note (Signed)
[facility]                               PULMONARY OFFICE NOTE   NAME:[name], [name]                        MRN:          [id number]  DATE:09/23/2006                            DOB:          Oct 27, 1966    PROBLEMS:  1. Chronic steroid-dependent asthma.  2. Allergic rhinitis.  3. History of deep vein thrombosis/Coumadin/factor V Leiden positive.  4. Nasal polyps?   HISTORY:  Blames weather over the past 2-3 weeks for daily wheeze currently.  Peak flow, she says, averages 300-325, but today could be less.  She has  been using prednisone 5 mg tabs, taking 2 a day currently and using her  nebulizer with DuoNeb about 4 times a day, plus her Advair, plus her  albuterol inhaler.  She has been trying to go to a gym and is walking on a  treadmill, which she says usually she tolerates well.  There has been no  bleeding, purulent discharge, pain, or palpitations.  We reviewed her  previous lack of success with Singulair and Theo-dur, and we talked again  about steroid effects and concerns.   MEDICATIONS:  1. Prednisone 5 mg tabs variable dosing.  2. Fosamax has been discontinued because of cost.  3. Advair 250/50.  4. Vitamins.  5. Iron.  6. Calcium.  7. Coumadin.  8. Prenatal vitamin with iron.  9. Albuterol inhaler.  10.DuoNeb by nebulizer.   DRUG INTOLERANCES:  ASPIRIN and PENICILLIN with rash.   OBJECTIVE:  Weight 196 pounds, BP 119/81, pulse regular 82, room air  saturation 97%.  Breath sounds are quiet and shallow.  I do not hear wheeze and she seems  unlabored currently.  There is no nasal congestion.  She looks mildly  cushingoid.  I cannot see any polyps in her nose today.  HEART:  Sounds are regular without murmur.  There is no peripheral edema or  cyanosis.   IMPRESSION:  1. Recent exacerbation of asthma with chronic obstructive pulmonary      disease.  Plan:  She has not kept previous appointments for PFT and we      discussed  this, and are going to try again.  2. Try changing Advair 250 to Symbicort samples 160/4.5 two puffs b.i.d.  3. Continue prednisone at 10 mg daily until stable with prescription      refilled.  4. Flu vaccine.  5. Schedule return in 2 months, earlier p.r.n.       Clinton D. Maple Hudson, MD, FCCP, FACP      CDY/MedQ  DD:  09/25/2006  DT:  09/26/2006  Job #:  [id number]

## 2011-05-12 ENCOUNTER — Ambulatory Visit (INDEPENDENT_AMBULATORY_CARE_PROVIDER_SITE_OTHER): Payer: BC Managed Care – PPO

## 2011-05-12 DIAGNOSIS — J45909 Unspecified asthma, uncomplicated: Secondary | ICD-10-CM

## 2011-05-12 MED ORDER — OMALIZUMAB 150 MG ~~LOC~~ SOLR
150.0000 mg | Freq: Once | SUBCUTANEOUS | Status: AC
  Administered 2011-05-12: 150 mg via SUBCUTANEOUS

## 2011-06-10 ENCOUNTER — Telehealth: Payer: Self-pay | Admitting: Internal Medicine

## 2011-06-10 NOTE — Telephone Encounter (Signed)
Spoke with pt to verify msg. Sample of proventil left up front for pick up.

## 2011-06-11 ENCOUNTER — Ambulatory Visit (INDEPENDENT_AMBULATORY_CARE_PROVIDER_SITE_OTHER): Payer: BC Managed Care – PPO

## 2011-06-11 DIAGNOSIS — J45909 Unspecified asthma, uncomplicated: Secondary | ICD-10-CM

## 2011-06-11 MED ORDER — OMALIZUMAB 150 MG ~~LOC~~ SOLR
150.0000 mg | Freq: Once | SUBCUTANEOUS | Status: AC
  Administered 2011-06-11: 150 mg via SUBCUTANEOUS

## 2011-06-14 ENCOUNTER — Ambulatory Visit: Payer: BC Managed Care – PPO

## 2011-06-22 ENCOUNTER — Telehealth: Payer: Self-pay | Admitting: Internal Medicine

## 2011-06-22 ENCOUNTER — Ambulatory Visit: Payer: BC Managed Care – PPO | Admitting: Internal Medicine

## 2011-06-22 ENCOUNTER — Telehealth: Payer: Self-pay | Admitting: *Deleted

## 2011-06-22 DIAGNOSIS — J45909 Unspecified asthma, uncomplicated: Secondary | ICD-10-CM

## 2011-06-22 MED ORDER — PREDNISONE 10 MG PO TABS: 10.0000 mg | ORAL_TABLET | Freq: Every day | ORAL | Status: DC

## 2011-06-22 NOTE — Telephone Encounter (Signed)
Called, spoke with pt.  She is requesting rx for prednisone 10mg  tablets.  [facility].  Rx sent -- pt aware.

## 2011-06-22 NOTE — Telephone Encounter (Signed)
See previous phone note for additional details --- rx was printed instead of sent in electronically.  Rx cannot be sent to [facility] electronically d/t system.  Therefore it was called [facility].  He verbalized understanding of this.

## 2011-06-22 NOTE — Telephone Encounter (Signed)
Per CY-okay to give RX as requested.

## 2011-07-14 ENCOUNTER — Ambulatory Visit (INDEPENDENT_AMBULATORY_CARE_PROVIDER_SITE_OTHER): Payer: BC Managed Care – PPO

## 2011-07-14 DIAGNOSIS — J45909 Unspecified asthma, uncomplicated: Secondary | ICD-10-CM

## 2011-07-14 MED ORDER — OMALIZUMAB 150 MG ~~LOC~~ SOLR
150.0000 mg | Freq: Once | SUBCUTANEOUS | Status: AC
  Administered 2011-07-14: 150 mg via SUBCUTANEOUS

## 2011-07-20 ENCOUNTER — Encounter: Payer: Self-pay | Admitting: Internal Medicine

## 2011-07-20 ENCOUNTER — Ambulatory Visit (INDEPENDENT_AMBULATORY_CARE_PROVIDER_SITE_OTHER)
Admission: RE | Admit: 2011-07-20 | Discharge: 2011-07-20 | Disposition: A | Discharge status: Home / Self Care | Payer: BC Managed Care – PPO | Source: Ambulatory Visit | Attending: Internal Medicine | Admitting: Internal Medicine

## 2011-07-20 ENCOUNTER — Other Ambulatory Visit (INDEPENDENT_AMBULATORY_CARE_PROVIDER_SITE_OTHER): Payer: BC Managed Care – PPO

## 2011-07-20 ENCOUNTER — Ambulatory Visit (INDEPENDENT_AMBULATORY_CARE_PROVIDER_SITE_OTHER): Payer: BC Managed Care – PPO | Admitting: Internal Medicine

## 2011-07-20 VITALS — BP 120/64 | HR 108 | Ht 67.0 in | Wt 206.6 lb

## 2011-07-20 DIAGNOSIS — J45909 Unspecified asthma, uncomplicated: Secondary | ICD-10-CM

## 2011-07-20 DIAGNOSIS — J330 Polyp of nasal cavity: Secondary | ICD-10-CM

## 2011-07-20 MED ORDER — METHYLPREDNISOLONE ACETATE 80 MG/ML IJ SUSP
80.0000 mg | Freq: Once | INTRAMUSCULAR | Status: AC
  Administered 2011-07-20: 80 mg via INTRAMUSCULAR

## 2011-07-20 MED ORDER — PREDNISONE 10 MG PO TABS: 10.0000 mg | ORAL_TABLET | Freq: Every day | ORAL | Status: DC

## 2011-07-20 NOTE — Assessment & Plan Note (Signed)
Exacerbation of chronic severe asthma. We will stop Xolair as unhelpful, and update key labs- PFT, CXR, CBC, BMET, LFT

## 2011-07-20 NOTE — Progress Notes (Signed)
Subjective:    Patient ID: [name], female    DOB: 1966/10/23, 45 y.o.   MRN: [id number]  Asthma Her past medical history is significant for asthma.   04/02/11-44 yo F followed for severe asthma requiring frequent steroids in past, chronic fixed obstruction, hx nasal polyps, allergic rhinitis, hx DVT.[name] Now first pregnancy with concern for complications, started on heparin.  Asthma control not good- blames weather, pollen. Has  Been on daily prednisone and started herself on a burst yesterday from 40 mg daily. Denies fever or infection. Continues Xolair, Dulera, and usually just 10 mg prednisone daily. We discussed asthma meds and pregnancy, looked up Xolair in [employer] for review with her, and asked her to discus with her high risk pregnancy physicians.   07/20/11- 37 yo F followed for severe asthma requiring frequent steroids in past, Failed Xolair, chronic fixed obstruction, hx nasal polyps, allergic rhinitis, hx DVT. Acute-Has not seen difference with Xolair now after 6 months. Has not been able to stayoff prednisone, baseline 10 mg/ day, but has put herself on a burst and taper a couple of times. Discussed steroid side effects again.  Up and down, varies with weather. Heat is hard. Feet swell a little. Last two days she has been more tight in chest. Trying again to get pregnant.  No recurrence of nasal polyps- still sees Dr Lazarus Salines.   Review of Systems  See HPI Constitutional:   No weight loss, night sweats,  Fevers, chills, fatigue, lassitude. HEENT:   No headaches,  Difficulty swallowing,  Tooth/dental problems,  Sore throat,                No sneezing, itching, ear ache,, post nasal drip. Some nasal congestion.  CV:  No chest pain,  Orthopnea, PND, swelling in lower extremities, anasarca, dizziness, palpitations  GI  No heartburn, indigestion, abdominal pain, nausea, vomiting, diarrhea, change in bowel habits, loss of appetite  Resp:Usually someshortness of breath with  exertion, not at rest.  No excess mucus, no productive cough,  No non-productive cough,  No coughing up of blood.  No change in color of mucus.  No wheezing.  No chest wall deformity  Skin: no rash or lesions.  GU: no dysuria, change in color of urine, no urgency or frequency.  No flank pain.  MS:  No joint pain or swelling.  No decreased range of motion.  No back pain.  Psych:  No change in mood or affect. No depression or anxiety.  No memory loss.      Objective:   Physical Exam  General- Alert, Oriented, Affect-appropriate, Distress- none acute  Relaxed and conversational   overweight Skin- rash-none, lesions- none, excoriation- none Lymphadenopathy- none Head- atraumatic            Eyes- Gross vision intact, PERRLA, conjunctivae clear secretions            Ears- Hearing, canals normal            Nose- Clear, No-Septal dev, mucus, polyps, erosion, perforation             Throat- Mallampati II-III, mucosa clear , drainage- none, tonsils- atrophic Neck- flexible , trachea midline, no stridor , thyroid nl, carotid no bruit Chest - symmetrical excursion , unlabored           Heart/CV- RRR , no murmur , no gallop  , no rub, nl s1 s2                           -  JVD- none , edema- none, stasis changes- none, varices- none           Lung- +Coarse sounds, trace wheeze, unlabored,                cough- none , dullness-none, rub- none           Chest wall-  Abd- tender-no, distended-no, bowel sounds-present, HSM- no Br/ Gen/ Rectal- Not done, not indicated Extrem- cyanosis- none, clubbing, none, atrophy- none, strength- nl Neuro- grossly intact to observation          Assessment & Plan:

## 2011-07-20 NOTE — Patient Instructions (Signed)
Continue managing prednisone as you are, always trying for the least you really need to breathe easily.  Order- schedule PFT    Dx asthma        CXR    Lab- CBC w/ diff, BMET, LIVER function   Depo 80 today

## 2011-07-21 LAB — CBC WITH DIFFERENTIAL/PLATELET
Basophils Absolute: 0 10*3/uL (ref 0.0–0.1)
Basophils Relative: 0.3 % (ref 0.0–3.0)
Eosinophils Absolute: 0.2 10*3/uL (ref 0.0–0.7)
Eosinophils Relative: 1.5 % (ref 0.0–5.0)
HCT: 42.4 % (ref 36.0–46.0)
Hemoglobin: 13.7 g/dL (ref 12.0–15.0)
Lymphocytes Relative: 16.8 % (ref 12.0–46.0)
Lymphs Abs: 2.3 10*3/uL (ref 0.7–4.0)
MCHC: 32.3 g/dL (ref 30.0–36.0)
MCV: 81.8 fl (ref 78.0–100.0)
Monocytes Absolute: 0.9 10*3/uL (ref 0.1–1.0)
Monocytes Relative: 6.4 % (ref 3.0–12.0)
Neutro Abs: 10.1 10*3/uL — ABNORMAL HIGH (ref 1.4–7.7)
Neutrophils Relative %: 75 % (ref 43.0–77.0)
Platelets: 283 10*3/uL (ref 150.0–400.0)
RBC: 5.19 Mil/uL — ABNORMAL HIGH (ref 3.87–5.11)
RDW: 14.2 % (ref 11.5–14.6)
WBC: 13.5 10*3/uL — ABNORMAL HIGH (ref 4.5–10.5)

## 2011-07-21 LAB — HEPATIC FUNCTION PANEL
ALT: 24 U/L (ref 0–35)
AST: 22 U/L (ref 0–37)
Albumin: 3.8 g/dL (ref 3.5–5.2)
Alkaline Phosphatase: 80 U/L (ref 39–117)
Bilirubin, Direct: 0.1 mg/dL (ref 0.0–0.3)
Total Bilirubin: 0.6 mg/dL (ref 0.3–1.2)
Total Protein: 6.8 g/dL (ref 6.0–8.3)

## 2011-07-21 LAB — BASIC METABOLIC PANEL
BUN: 13 mg/dL (ref 6–23)
CO2: 30 mEq/L (ref 19–32)
Calcium: 9.2 mg/dL (ref 8.4–10.5)
Chloride: 110 mEq/L (ref 96–112)
Creatinine, Ser: 0.9 mg/dL (ref 0.4–1.2)
GFR: 83.31 mL/min (ref >60.00)
Glucose, Bld: 79 mg/dL (ref 70–99)
Potassium: 3.7 mEq/L (ref 3.5–5.1)
Sodium: 143 mEq/L (ref 135–145)

## 2011-07-22 ENCOUNTER — Encounter: Payer: Self-pay | Admitting: Internal Medicine

## 2011-07-22 NOTE — Assessment & Plan Note (Signed)
None seen this visit.

## 2011-07-22 NOTE — Assessment & Plan Note (Signed)
>>  ASSESSMENT AND PLAN FOR NASAL POLYP WRITTEN ON 07/22/2011  8:45 PM BY YOUNG, CLINTON D, MD  None seen this visit.

## 2011-07-26 ENCOUNTER — Telehealth: Payer: Self-pay | Admitting: *Deleted

## 2011-07-26 ENCOUNTER — Other Ambulatory Visit: Payer: Self-pay | Admitting: Internal Medicine

## 2011-07-26 DIAGNOSIS — J449 Chronic obstructive pulmonary disease, unspecified: Secondary | ICD-10-CM

## 2011-07-26 DIAGNOSIS — Z8672 Personal history of thrombophlebitis: Secondary | ICD-10-CM

## 2011-07-26 NOTE — Telephone Encounter (Signed)
I spoke with patient-she agrees and understands to have 2D Echo done. I have placed order and [facility] will call patient with time and date.

## 2011-07-26 NOTE — Progress Notes (Signed)
Quick Note:  LMTCB ______ 

## 2011-07-26 NOTE — Progress Notes (Signed)
Quick Note:  Pt aware of results. ______ 

## 2011-07-26 NOTE — Telephone Encounter (Signed)
With her hx of lung problems and persistent swelling now, we need to get a 2 D Echocardiogram looking for pulmonary hypertension. If she agrees, please schedule this.

## 2011-07-30 ENCOUNTER — Telehealth: Payer: Self-pay | Admitting: Internal Medicine

## 2011-07-30 MED ORDER — AZITHROMYCIN 250 MG PO TABS: ORAL_TABLET | ORAL | Status: AC

## 2011-07-30 NOTE — Telephone Encounter (Signed)
Pt says she's at the pharmacy to pick up her rx because [facility] called and told her she had sent it in and they don't have it can be reached at [phone].[name]

## 2011-07-30 NOTE — Telephone Encounter (Signed)
zpak has been sent to the pharmacy.

## 2011-07-30 NOTE — Telephone Encounter (Signed)
i called and spoke with pt and she stated that someone from our office had already talked to her but it was not katie.  No one from this office has called the pt.  Pt stated that she has been coughing with some light green sputum and is requesting that a zpak be sent to her pharmacy.  CY please advise. thanks

## 2011-07-30 NOTE — Telephone Encounter (Signed)
Per CY okay to give Zpak #1 take as directed no refills. 

## 2011-08-02 ENCOUNTER — Other Ambulatory Visit (HOSPITAL_COMMUNITY): Payer: BC Managed Care – PPO | Admitting: Radiology

## 2011-08-04 ENCOUNTER — Ambulatory Visit (HOSPITAL_COMMUNITY): Payer: BC Managed Care – PPO | Attending: Internal Medicine | Admitting: Radiology

## 2011-08-04 DIAGNOSIS — J449 Chronic obstructive pulmonary disease, unspecified: Secondary | ICD-10-CM

## 2011-08-04 DIAGNOSIS — Z8672 Personal history of thrombophlebitis: Secondary | ICD-10-CM

## 2011-08-04 DIAGNOSIS — R609 Edema, unspecified: Secondary | ICD-10-CM

## 2011-08-04 DIAGNOSIS — J45909 Unspecified asthma, uncomplicated: Secondary | ICD-10-CM | POA: Insufficient documentation

## 2011-08-12 NOTE — Progress Notes (Signed)
Quick Note:  Pt aware of results. ______ 

## 2011-08-13 ENCOUNTER — Ambulatory Visit: Payer: BC Managed Care – PPO | Admitting: Internal Medicine

## 2011-09-02 ENCOUNTER — Telehealth: Payer: Self-pay | Admitting: Internal Medicine

## 2011-09-02 MED ORDER — AZITHROMYCIN 250 MG PO TABS: ORAL_TABLET | ORAL | Status: AC

## 2011-09-02 NOTE — Telephone Encounter (Signed)
rx sent to pharmacy.  Pt aware.  

## 2011-09-02 NOTE — Telephone Encounter (Signed)
Per CY okay to give Zpak #1 take as directed no refills. 

## 2011-09-09 ENCOUNTER — Encounter: Payer: Self-pay | Admitting: Internal Medicine

## 2011-09-09 ENCOUNTER — Ambulatory Visit (INDEPENDENT_AMBULATORY_CARE_PROVIDER_SITE_OTHER): Payer: BC Managed Care – PPO | Admitting: Internal Medicine

## 2011-09-09 VITALS — BP 118/78 | HR 100 | Ht 67.0 in | Wt 200.0 lb

## 2011-09-09 DIAGNOSIS — J449 Chronic obstructive pulmonary disease, unspecified: Secondary | ICD-10-CM

## 2011-09-09 DIAGNOSIS — J45909 Unspecified asthma, uncomplicated: Secondary | ICD-10-CM

## 2011-09-09 LAB — PULMONARY FUNCTION TEST

## 2011-09-09 MED ORDER — TRAMADOL HCL 50 MG PO TABS: 50.0000 mg | ORAL_TABLET | Freq: Three times a day (TID) | ORAL | Status: DC | PRN

## 2011-09-09 NOTE — Progress Notes (Signed)
Subjective:    Patient ID: [name], female    DOB: 30-May-1966, 45 y.o.   MRN: [id number]  HPI Review of Systems    Objective:   Physical Exam    Assessment & Plan:   Subjective:    Patient ID: [name], female    DOB: [birth year], 45 y.o.   MRN: [id number]  Asthma Her past medical history is significant for asthma.   04/02/11-44 yo F followed for severe asthma requiring frequent steroids in past, chronic fixed obstruction, hx nasal polyps, allergic rhinitis, hx DVT.[name] Now first pregnancy with concern for complications, started on heparin.  Asthma control not good- blames weather, pollen. Has  Been on daily prednisone and started herself on a burst yesterday from 40 mg daily. Denies fever or infection. Continues Xolair, Dulera, and usually just 10 mg prednisone daily. We discussed asthma meds and pregnancy, looked up Xolair in [employer] for review with her, and asked her to discus with her high risk pregnancy physicians.   07/20/11- 11 yo F followed for severe asthma requiring frequent steroids in past, Failed Xolair, chronic fixed obstruction, hx nasal polyps, allergic rhinitis, hx DVT. Acute-Has not seen difference with Xolair now after 6 months. Has not been able to stayoff prednisone, baseline 10 mg/ day, but has put herself on a burst and taper a couple of times. Discussed steroid side effects again.  Up and down, varies with weather. Heat is hard. Feet swell a little. Last two days she has been more tight in chest. Trying again to get pregnant.  No recurrence of nasal polyps- still sees Dr Lazarus Salines.  09/09/11-  33 yo F followed for severe asthma (requiring frequent steroids , Failed Xolair, chronic fixed obstruction), hx nasal polyps, allergic rhinitis, hx DVT. She had flu and pneumonia vaccines 2 weeks ago. Feels that she is doing "okay" meaning she is getting by. We had stopped Xolair as unsuccessful. She stopped taking prednisone in the first week of August because  she is just desperate to be away from it. Complains of generalized body aching. We discussed adrenal insufficiency and steroid withdrawal again. Noticing some watery rhinorrhea. Milana Obey which she is using only once daily. PFT- moderate obstructive airways disease with insignificant response to bronchodilator and mild restriction FEV1/FVC 0.54 Echo 08/04/2011-normal LV, EF 55-60% right atrium and pressures normal without pulmonary hypertension.  Review of Systems See HPI Constitutional:   No weight loss, night sweats,  Fevers, chills, fatigue, lassitude. HEENT:   No headaches,  Difficulty swallowing,  Tooth/dental problems,  Sore throat,                No sneezing, itching, ear ache,, post nasal drip. Some nasal congestion. CV:  No chest pain,  Orthopnea, PND, swelling in lower extremities, anasarca, dizziness, palpitations GI  No heartburn, indigestion, abdominal pain, nausea, vomiting, diarrhea, change in bowel habits, loss of appetite Resp:Usually someshortness of breath with exertion, not at rest.  No excess mucus, no productive cough,  No non-productive cough,  No coughing up of blood.  No change in color of mucus.  No wheezing.  No chest wall deformity Skin: no rash or lesions. GU: no dysuria, change in color of urine, no urgency or frequency.  No flank pain. MS:  No joint pain or swelling.  No decreased range of motion.  No back pain.  Psych:  No change in mood or affect. No depression or anxiety.  No memory loss. Objective:   Physical Exam  General- Alert, Oriented,  Affect-appropriate, Distress- none acute  Relaxed and conversational   overweight Skin- rash-none, lesions- none, excoriation- none Lymphadenopathy- none Head- atraumatic            Eyes- Gross vision intact, PERRLA, conjunctivae clear secretions            Ears- Hearing, canals normal            Nose- Clear, No-Septal dev, mucus, polyps, erosion, perforation             Throat- Mallampati II-III, mucosa clear ,  drainage- none, tonsils- atrophic Neck- flexible , trachea midline, no stridor , thyroid nl, carotid no bruit Chest - symmetrical excursion , unlabored           Heart/CV- RRR , no murmur , no gallop  , no rub, nl s1 s2                           - JVD- none , edema- none, stasis changes- none, varices- none           Lung- +Coarse sounds, no wheeze, unlabored,                cough- none , dullness-none, rub- none           Chest wall-  Abd- tender-no, distended-no, bowel sounds-present, HSM- no Br/ Gen/ Rectal- Not done, not indicated Extrem- cyanosis- none, clubbing, none, atrophy- none, strength- nl Neuro- grossly intact to observation          Assessment & Plan:

## 2011-09-09 NOTE — Patient Instructions (Addendum)
Script tramadol to use if needed for pain. Use otc meds if they are good enough. Consider a heating pad.   Sample Nasonex - 2 sprays each nostril once daily at bedtime  Consider an otc antihistamine like loratadine or fexofenadine for daytime help with runny nose. You can use benadryl at night if needed to help sleep  2 sample of Dulera 100-5 to use 2 puffs, twice daily instead of the Dulera 200.

## 2011-09-09 NOTE — Progress Notes (Signed)
PFT done today. 

## 2011-09-12 NOTE — Assessment & Plan Note (Signed)
Pressure is good enough, hopefully she won't have adrenal insufficiency problems and the aching will pass. It remains to be seen whether we can control her wheezing dyspnea with [facility] but the significant fixed obstructive component may not require additional treatment now even though we can't make it better.

## 2011-09-16 ENCOUNTER — Encounter: Payer: Self-pay | Admitting: Internal Medicine

## 2011-09-22 ENCOUNTER — Telehealth: Payer: Self-pay | Admitting: Internal Medicine

## 2011-09-22 MED ORDER — FUROSEMIDE 20 MG PO TABS: 20.0000 mg | ORAL_TABLET | Freq: Every day | ORAL | Status: DC

## 2011-09-22 NOTE — Telephone Encounter (Signed)
Per CY-okay to give patient Lasix 20mg  #3 take 1 po qd x 3 days-no refills. Pt is aware of Rx sent and understands to call us if no better.

## 2011-09-25 ENCOUNTER — Other Ambulatory Visit: Payer: Self-pay | Admitting: Internal Medicine

## 2011-09-27 ENCOUNTER — Telehealth: Payer: Self-pay | Admitting: Internal Medicine

## 2011-09-27 MED ORDER — IPRATROPIUM BROMIDE 0.02 % IN SOLN: RESPIRATORY_TRACT | Status: DC

## 2011-09-27 MED ORDER — ALBUTEROL SULFATE (2.5 MG/3ML) 0.083% IN NEBU: INHALATION_SOLUTION | RESPIRATORY_TRACT | Status: DC

## 2011-09-27 NOTE — Telephone Encounter (Signed)
Per CY-okay to work her in tomorrow morning;pt aware to be here Tuesday morning at 1115am for 1130am appt with CY.

## 2011-09-27 NOTE — Telephone Encounter (Signed)
I spoke with pt and she states she needs for albuterol and ipratropium nebulizer medications sent to [facility] not [facility]. I advised pt will resend meds for her. Pt also she is also experiencing some swelling her both of her ankles. Pt states she was put on lasix x3 days to help but she still has the swelling given to her by Dr. Maple Hudson. Please advise Dr. Maple Hudson. Thanks  Carver Fila, CMA

## 2011-09-27 NOTE — Telephone Encounter (Signed)
I have already refilled the neb machines today and per CY-okay to give Lasix

## 2011-09-27 NOTE — Telephone Encounter (Signed)
I called and spoke with pt.  I informed her CDY recs she take the lasix 1 po qd prn and when she takes this she will need to take Klor con.  However, pt states when he gave her this last week, it did not help.  Pt states "I don't think he realizes how swollen they are."  I offered OV, which pt is ok with for CDY to assess her, but she is requesting something tomorrow or Thursday.  Katie/Dr. Maple Hudson, pls advise if pt can be worked in.  Thanks!

## 2011-09-27 NOTE — Telephone Encounter (Signed)
Per CY-okay to give Lasix 20mg  #30 take 1 po qd prn no refills and make sure patient understands she needs to pick up Klor Con #30 take 1 po qd prn with Lasix no refills.

## 2011-09-28 ENCOUNTER — Ambulatory Visit (INDEPENDENT_AMBULATORY_CARE_PROVIDER_SITE_OTHER): Payer: BC Managed Care – PPO | Admitting: Internal Medicine

## 2011-09-28 ENCOUNTER — Encounter: Payer: Self-pay | Admitting: Internal Medicine

## 2011-09-28 ENCOUNTER — Other Ambulatory Visit: Payer: BC Managed Care – PPO

## 2011-09-28 VITALS — BP 112/70 | Ht 67.0 in | Wt 203.2 lb

## 2011-09-28 DIAGNOSIS — J45909 Unspecified asthma, uncomplicated: Secondary | ICD-10-CM

## 2011-09-28 DIAGNOSIS — I824Z9 Acute embolism and thrombosis of unspecified deep veins of unspecified distal lower extremity: Secondary | ICD-10-CM

## 2011-09-28 DIAGNOSIS — Z8672 Personal history of thrombophlebitis: Secondary | ICD-10-CM

## 2011-09-28 LAB — PROTIME-INR
INR: 1.4
Prothrombin Time: 17 — ABNORMAL HIGH

## 2011-09-28 LAB — CBC
MCHC: 32.6
Platelets: 297
RDW: 14.5 — ABNORMAL HIGH

## 2011-09-28 MED ORDER — PREDNISONE 10 MG PO TABS: ORAL_TABLET | ORAL | Status: DC

## 2011-09-28 MED ORDER — FUROSEMIDE 40 MG PO TABS: 40.0000 mg | ORAL_TABLET | Freq: Every day | ORAL | Status: DC

## 2011-09-28 MED ORDER — POTASSIUM CHLORIDE ER 8 MEQ PO TBCR: 8.0000 meq | EXTENDED_RELEASE_TABLET | Freq: Two times a day (BID) | ORAL | Status: DC

## 2011-09-28 NOTE — Progress Notes (Signed)
Patient ID: [name], female    DOB: 03/21/1966, 45 y.o.   MRN: [id number]  Asthma Her past medical history is significant for asthma.   04/02/11-44 yo F followed for severe asthma requiring frequent steroids in past, chronic fixed obstruction, hx nasal polyps, allergic rhinitis, hx DVT.[name] Now first pregnancy with concern for complications, started on heparin.  Asthma control not good- blames weather, pollen. Has  Been on daily prednisone and started herself on a burst yesterday from 40 mg daily. Denies fever or infection. Continues Xolair, Dulera, and usually just 10 mg prednisone daily. We discussed asthma meds and pregnancy, looked up Xolair in [employer] for review with her, and asked her to discus with her high risk pregnancy physicians.   07/20/11- 11 yo F followed for severe asthma requiring frequent steroids in past, Failed Xolair, chronic fixed obstruction, hx nasal polyps, allergic rhinitis, hx DVT. Acute-Has not seen difference with Xolair now after 6 months. Has not been able to stayoff prednisone, baseline 10 mg/ day, but has put herself on a burst and taper a couple of times. Discussed steroid side effects again.  Up and down, varies with weather. Heat is hard. Feet swell a little. Last two days she has been more tight in chest. Trying again to get pregnant.  No recurrence of nasal polyps- still sees Dr Lazarus Salines.  09/09/11-  52 yo F followed for severe asthma (requiring frequent steroids , Failed Xolair, chronic fixed obstruction), hx nasal polyps, allergic rhinitis, hx DVT. She had flu and pneumonia vaccines 2 weeks ago. Feels that she is doing "okay" meaning she is getting by. We had stopped Xolair as unsuccessful. She stopped taking prednisone in the first week of August because she is just desperate to be away from it. Complains of generalized body aching. We discussed adrenal insufficiency and steroid withdrawal again. Noticing some watery rhinorrhea. Milana Obey which she is  using only once daily. PFT- moderate obstructive airways disease with insignificant response to bronchodilator and mild restriction FEV1/FVC 0.54 Echo 08/04/2011-normal LV, EF 55-60% right atrium and pressures normal without pulmonary hypertension.  09/28/11- 48 yo F followed for severe asthma (requiring frequent steroids , Failed Xolair, chronic fixed obstruction), hx nasal polyps, allergic rhinitis, hx DVT. Increased wheeze during the night-used nebulizer 3 times a day before coming here. Not on prednisone. More edema in her legs, left greater than right. Left leg was involved with a DVT years ago. Denies chest pain, palpitation, bloody sputum. Thinks she may have an early cold because nasal discharge is yellow. Lasix 20 mg daily has not been helping her leg edema.   Review of Systems See HPI Constitutional:   No weight loss, night sweats,  Fevers, chills, fatigue, lassitude. HEENT:   No headaches,  Difficulty swallowing,  Tooth/dental problems,  Sore throat,                No sneezing, itching, ear ache,, post nasal drip. Some nasal congestion. CV:  No chest pain,  Orthopnea, PND, swelling in lower extremities, anasarca, dizziness, palpitations GI  No heartburn, indigestion, abdominal pain, nausea, vomiting, diarrhea, change in bowel habits, loss of appetite Resp:Usually someshortness of breath with exertion, not at rest.  No excess mucus, no productive cough,  No non-productive cough,  No coughing up of blood.  No change in color of mucus.  No wheezing.  No chest wall deformity Skin: no rash or lesions. GU: no dysuria, change in color of urine, no urgency or frequency.  No flank pain.  MS:  No joint pain, + swelling.  No decreased range of motion.  No back pain.  Psych:  No change in mood or affect. No depression or anxiety.  No memory loss. Objective:   Physical Exam  General- Alert, Oriented, Affect-appropriate, Distress- none acute  Relaxed and conversational   overweight Skin-  rash-none, lesions- none, excoriation- none Lymphadenopathy- none Head- atraumatic            Eyes- Gross vision intact, PERRLA, conjunctivae clear secretions            Ears- Hearing, canals normal            Nose- Clear, No-Septal dev, mucus, polyps, erosion, perforation             Throat- Mallampati II-III, mucosa clear , drainage- none, tonsils- atrophic Neck- flexible , trachea midline, no stridor , thyroid nl, carotid no bruit Chest - symmetrical excursion , unlabored           Heart/CV- RRR , no murmur , no gallop  , no rub, nl s1 s2                           - JVD- none , 3+edema left, 2+edema right, stasis changes- none, varices- none           Lung- +Coarse sounds, no wheeze, unlabored,                cough- none , dullness-none, rub- none           Chest wall-  Abd- tender-no, distended-no, bowel sounds-present, HSM- no Br/ Gen/ Rectal- Not done, not indicated Extrem- cyanosis- none, clubbing, none, atrophy- none, strength- nl Neuro- grossly intact to observation

## 2011-09-28 NOTE — Patient Instructions (Addendum)
Order- Doppler bilateral leg veins for dx DVT             Lab-  D dimer   Dx DVT  Script sent for prednisone taper, lasix 40 mg, potassium  Elevate legs as much as you can

## 2011-09-29 LAB — D-DIMER, QUANTITATIVE: D-Dimer, Quant: 1.2 ug/mL-FEU — ABNORMAL HIGH (ref 0.00–0.48)

## 2011-09-29 NOTE — Progress Notes (Signed)
Quick Note:  Pt aware of results and aware to keep appt for leg vein dopplers tomorrow. ______

## 2011-09-30 ENCOUNTER — Telehealth: Payer: Self-pay | Admitting: Internal Medicine

## 2011-09-30 ENCOUNTER — Encounter: Payer: Self-pay | Admitting: *Deleted

## 2011-09-30 ENCOUNTER — Ambulatory Visit (HOSPITAL_COMMUNITY)
Admission: RE | Admit: 2011-09-30 | Discharge: 2011-09-30 | Disposition: A | Discharge status: Home / Self Care | Payer: BC Managed Care – PPO | Source: Ambulatory Visit | Attending: Internal Medicine | Admitting: Internal Medicine

## 2011-09-30 DIAGNOSIS — M79609 Pain in unspecified limb: Secondary | ICD-10-CM | POA: Insufficient documentation

## 2011-09-30 MED ORDER — TORSEMIDE 10 MG PO TABS: 10.0000 mg | ORAL_TABLET | Freq: Two times a day (BID) | ORAL | Status: DC

## 2011-09-30 MED ORDER — POTASSIUM CHLORIDE ER 8 MEQ PO TBCR: 8.0000 meq | EXTENDED_RELEASE_TABLET | Freq: Two times a day (BID) | ORAL | Status: DC

## 2011-09-30 NOTE — Assessment & Plan Note (Signed)
Chronic fixed asthma. Echocardiogram does not demonstrate pulmonary hypertension. We will evaluate her peripheral edema with concern for possible DVT. I do not think we are seeing heart failure.

## 2011-09-30 NOTE — Assessment & Plan Note (Signed)
Plan d-dimer and leg vein Dopplers.

## 2011-09-30 NOTE — Telephone Encounter (Signed)
Per CY-ok to give LOA work note 10-12 through 10-17: asthma, peripheral edema and D/C lasix Rx Torsemide 10mg  #30 take 1 po bid with 1 refill.

## 2011-09-30 NOTE — Telephone Encounter (Signed)
Spoke with pt. She states that she needs note to be excused from work from 10/12-10/17. She states that she still ahs slight swelling in her legs and needs some more time to rest. She states that she is also having trouble with lasix causing her to "break out"- red, bumpy rash on her arms with little help taking benadryl. She also states pharm did not get the rx for [facility], and so I resent this. CDY, pls advise on possible reaction to lasix and work note. Thanks! Allergies  Allergen Reactions  . Aspirin   . Penicillins

## 2011-09-30 NOTE — Telephone Encounter (Signed)
Spoke with pt and notified of recs per CDY. Rx was sent to pharm and letter up front for pick up per pt request.

## 2011-10-07 ENCOUNTER — Telehealth: Payer: Self-pay | Admitting: Internal Medicine

## 2011-10-07 ENCOUNTER — Encounter: Payer: Self-pay | Admitting: *Deleted

## 2011-10-07 NOTE — Telephone Encounter (Signed)
Called and spoke with pt and she stated that she is needing a letter stating that she can return to work today without any restrictions.  Per CY ok to send in this letter for the pt.  Pt is aware that letter has been faxed in for her.

## 2011-10-07 NOTE — Telephone Encounter (Signed)
Ok to give patient letter stating okay to return to work as she requests.

## 2011-11-02 ENCOUNTER — Ambulatory Visit: Payer: BC Managed Care – PPO | Admitting: Internal Medicine

## 2011-11-15 ENCOUNTER — Telehealth: Payer: Self-pay | Admitting: Internal Medicine

## 2011-11-15 NOTE — Telephone Encounter (Signed)
I spoke with pt and she is requesting to be worked in 12/3 or 12/7 since she is off from work. Pt states her asthma is getting better but not where she wants it to be. Pt also requesting to stay on the torsemide. Pt states she is almost out. Pt states she is not having any swelling now and has cut it back to once a day. Pt states she was scheduled for  F/U last week but could not make it in. Please advise Dr. Maple Hudson, thanks

## 2011-11-16 MED ORDER — TORSEMIDE 10 MG PO TABS: 10.0000 mg | ORAL_TABLET | Freq: Two times a day (BID) | ORAL | Status: DC

## 2011-11-16 NOTE — Telephone Encounter (Signed)
Per CY-okay to refill Torsemide and patient has been placed on 11-22-11 schedule at 9am.

## 2011-11-16 NOTE — Telephone Encounter (Signed)
Pt aware of appt with CDY on 11/26/11 @ 9am.

## 2011-11-16 NOTE — Telephone Encounter (Signed)
Sorry I was going by the note previously but please let patient know that she can come in on 11-26-11 at 9am.

## 2011-11-16 NOTE — Telephone Encounter (Signed)
Pt is aware of Torsemide refill and this has been sent to [facility]. Pt states she cannot come in on 12/3 @ 9am because she does not get off work until [employer]. She is off work on 12/7 and wants to know if there is anything for that day. Pls advise.

## 2011-11-26 ENCOUNTER — Ambulatory Visit (INDEPENDENT_AMBULATORY_CARE_PROVIDER_SITE_OTHER): Payer: BC Managed Care – PPO | Admitting: Internal Medicine

## 2011-11-26 ENCOUNTER — Encounter: Payer: Self-pay | Admitting: Internal Medicine

## 2011-11-26 VITALS — BP 110/62 | HR 83 | Ht 67.0 in | Wt 199.2 lb

## 2011-11-26 DIAGNOSIS — J45909 Unspecified asthma, uncomplicated: Secondary | ICD-10-CM

## 2011-11-26 MED ORDER — TORSEMIDE 10 MG PO TABS: 10.0000 mg | ORAL_TABLET | Freq: Every day | ORAL | Status: DC

## 2011-11-26 MED ORDER — POTASSIUM CHLORIDE ER 8 MEQ PO TBCR: 8.0000 meq | EXTENDED_RELEASE_TABLET | ORAL | Status: DC

## 2011-11-26 NOTE — Patient Instructions (Signed)
Scripts sent to refill torsemide/ demadex and Klorcon/ potassium  Ok to continue prednisone 10 mg daily, but if you can ever get by once in a while dropping to 1/2 tab = 5 mg, that would be good.   Please call as needed

## 2011-11-26 NOTE — Progress Notes (Signed)
Patient ID: [name], female    DOB: Sep 22, 1966, 45 y.o.   MRN: [id number]  Asthma Her past medical history is significant for asthma.   04/02/11-44 yo F followed for severe asthma requiring frequent steroids in past, chronic fixed obstruction, hx nasal polyps, allergic rhinitis, hx DVT.[name] Now first pregnancy with concern for complications, started on heparin.  Asthma control not good- blames weather, pollen. Has  Been on daily prednisone and started herself on a burst yesterday from 40 mg daily. Denies fever or infection. Continues Xolair, Dulera, and usually just 10 mg prednisone daily. We discussed asthma meds and pregnancy, looked up Xolair in [employer] for review with her, and asked her to discus with her high risk pregnancy physicians.   07/20/11- 68 yo F followed for severe asthma requiring frequent steroids in past, Failed Xolair, chronic fixed obstruction, hx nasal polyps, allergic rhinitis, hx DVT. Acute-Has not seen difference with Xolair now after 6 months. Has not been able to stayoff prednisone, baseline 10 mg/ day, but has put herself on a burst and taper a couple of times. Discussed steroid side effects again.  Up and down, varies with weather. Heat is hard. Feet swell a little. Last two days she has been more tight in chest. Trying again to get pregnant.  No recurrence of nasal polyps- still sees Dr Lazarus Salines.  09/09/11-  71 yo F followed for severe asthma (requiring frequent steroids , Failed Xolair, chronic fixed obstruction), hx nasal polyps, allergic rhinitis, hx DVT. She had flu and pneumonia vaccines 2 weeks ago. Feels that she is doing "okay" meaning she is getting by. We had stopped Xolair as unsuccessful. She stopped taking prednisone in the first week of August because she is just desperate to be away from it. Complains of generalized body aching. We discussed adrenal insufficiency and steroid withdrawal again. Noticing some watery rhinorrhea. Milana Obey which she is  using only once daily. PFT- moderate obstructive airways disease with insignificant response to bronchodilator and mild restriction FEV1/FVC 0.54 Echo 08/04/2011-normal LV, EF 55-60% right atrium and pressures normal without pulmonary hypertension.  09/28/11- 38 yo F followed for severe asthma (requiring frequent steroids , Failed Xolair, chronic fixed obstruction), hx nasal polyps, allergic rhinitis, hx DVT. Increased wheeze during the night-used nebulizer 3 times a day before coming here. Not on prednisone. More edema in her legs, left greater than right. Left leg was involved with a DVT years ago. Denies chest pain, palpitation, bloody sputum. Thinks she may have an early cold because nasal discharge is yellow. Lasix 20 mg daily has not been helping her leg edema.  11/26/11- 67 yo F followed for severe asthma (requiring frequent steroids , Failed Xolair, chronic fixed obstruction), hx nasal polyps, allergic rhinitis, hx DVT. Has gotten flu vaccine and pneumonia vaccine at work. Says she feels pretty well for this visit. Has been taking 10 mg of prednisone after her last taper. She expects some wheeze and cough most days. Denies purulent sputum reflux or sinus drainage. Controls ankle edema with Demadex 10 mg taken on most days.  Review of Systems See HPI Constitutional:   No weight loss, night sweats,  Fevers, chills, fatigue, lassitude. HEENT:   No headaches,  Difficulty swallowing,  Tooth/dental problems,  Sore throat,                No sneezing, itching, ear ache,, post nasal drip. Some nasal congestion. CV:  No chest pain,  Orthopnea, PND, swelling in lower extremities, anasarca, dizziness, palpitations GI  No heartburn, indigestion, abdominal pain, nausea, vomiting, diarrhea, change in bowel habits, loss of appetite Resp:Usually someshortness of breath with exertion, not at rest.  No excess mucus, no productive cough,  + non-productive cough,  No coughing up of blood.  No change in color of  mucus.  No wheezing.  No chest wall deformity Skin: no rash or lesions. GU: no dysuria, change in color of urine, no urgency or frequency.  No flank pain. MS:  No joint pain, + swelling.  No decreased range of motion.  No back pain.  Psych:  No change in mood or affect. No depression or anxiety.  No memory loss. Objective:   Physical Exam  General- Alert, Oriented, Affect-appropriate, Distress- none acute  Relaxed and conversational   overweight Skin- rash-none, lesions- none, excoriation- none Lymphadenopathy- none Head- atraumatic            Eyes- Gross vision intact, PERRLA, conjunctivae clear secretions            Ears- Hearing, canals normal            Nose- Clear, No-Septal dev, mucus, polyps, erosion, perforation             Throat- Mallampati II-III, mucosa -trace thrush , drainage- none, tonsils- atrophic Neck- flexible , trachea midline, no stridor , thyroid nl, carotid no bruit Chest - symmetrical excursion , unlabored           Heart/CV- RRR , no murmur , no gallop  , no rub, nl s1 s2                           - JVD- none , trace edema left, trace edema right, stasis changes- none, varices- none           Lung- +Coarse sounds, no wheeze, unlabored, cough- none , dullness-none, rub- none           Chest wall-  Abd- tender-no, distended-no, bowel sounds-present, HSM- no Br/ Gen/ Rectal- Not done, not indicated Extrem- cyanosis- none, clubbing, none, atrophy- none, strength- nl Neuro- grossly intact to observation

## 2011-11-29 NOTE — Assessment & Plan Note (Signed)
Chronic fixed asthma, steroid dependent. Ankle edema probably reflects some cor pulmonale. She is reasonably controlled now on prednisone 10 mg daily. We have again discussed prednisone side effects and alternative strategies.

## 2011-12-09 ENCOUNTER — Other Ambulatory Visit: Payer: Self-pay | Admitting: Obstetrics and Gynecology

## 2012-02-14 ENCOUNTER — Telehealth: Payer: Self-pay | Admitting: Internal Medicine

## 2012-02-14 NOTE — Telephone Encounter (Signed)
Pt waiting in lobby [name]

## 2012-02-14 NOTE — Telephone Encounter (Signed)
We did not have any proventile but did have 1 proair. This was given to pt.

## 2012-02-22 ENCOUNTER — Telehealth: Payer: Self-pay | Admitting: Internal Medicine

## 2012-02-22 DIAGNOSIS — J45909 Unspecified asthma, uncomplicated: Secondary | ICD-10-CM

## 2012-02-22 MED ORDER — PREDNISONE 10 MG PO TABS: ORAL_TABLET | ORAL | Status: DC

## 2012-02-22 NOTE — Telephone Encounter (Signed)
Per CY-okay to increase Prednisone to 60 mg x 2 days, 40 mg x 2 days, 30 mg x 2 days, then back to 20 mg daily.

## 2012-02-22 NOTE — Telephone Encounter (Signed)
Called and spoke with pt. Pt aware of CY's recs and rx for pred sent to pharmacy.

## 2012-02-22 NOTE — Telephone Encounter (Signed)
Called, spoke with pt.  States she is having problems with her asthma -- c/o wheezing that comes and goes x 3 days.  Denies increased SOB, chest tightness, or cough.  Using albuterol neb qod with relief and currently taking prednisone 10 mg 2 tabs qd.  She is requesting OV but ok with something being called in if no work in spaces available.  Dr. Maple Hudson, pls advise.  Thanks!  [facility] Allergies verified Allergies  Allergen Reactions  . Aspirin   . Penicillins

## 2012-02-23 ENCOUNTER — Other Ambulatory Visit: Payer: Self-pay | Admitting: Internal Medicine

## 2012-06-08 ENCOUNTER — Telehealth: Payer: Self-pay | Admitting: Internal Medicine

## 2012-06-08 DIAGNOSIS — J45909 Unspecified asthma, uncomplicated: Secondary | ICD-10-CM

## 2012-06-08 MED ORDER — PREDNISONE 10 MG PO TABS: ORAL_TABLET | ORAL | Status: DC

## 2012-06-08 NOTE — Telephone Encounter (Signed)
Pt is on chronic prednisone by CDY. I have sent in rx and pt aware. Nothing further was needed

## 2012-06-12 ENCOUNTER — Ambulatory Visit (INDEPENDENT_AMBULATORY_CARE_PROVIDER_SITE_OTHER): Payer: BC Managed Care – PPO | Admitting: Internal Medicine

## 2012-06-12 ENCOUNTER — Encounter: Payer: Self-pay | Admitting: Internal Medicine

## 2012-06-12 VITALS — BP 110/82 | HR 102 | Ht 67.0 in | Wt 197.0 lb

## 2012-06-12 DIAGNOSIS — J45909 Unspecified asthma, uncomplicated: Secondary | ICD-10-CM

## 2012-06-12 DIAGNOSIS — J449 Chronic obstructive pulmonary disease, unspecified: Secondary | ICD-10-CM

## 2012-06-12 DIAGNOSIS — J4489 Other specified chronic obstructive pulmonary disease: Secondary | ICD-10-CM

## 2012-06-12 DIAGNOSIS — R600 Localized edema: Secondary | ICD-10-CM

## 2012-06-12 DIAGNOSIS — R609 Edema, unspecified: Secondary | ICD-10-CM

## 2012-06-12 DIAGNOSIS — J330 Polyp of nasal cavity: Secondary | ICD-10-CM

## 2012-06-12 MED ORDER — POTASSIUM CHLORIDE ER 8 MEQ PO TBCR: 8.0000 meq | EXTENDED_RELEASE_TABLET | ORAL | Status: DC

## 2012-06-12 MED ORDER — PREDNISONE 10 MG PO TABS: ORAL_TABLET | ORAL | Status: DC

## 2012-06-12 MED ORDER — TORSEMIDE 10 MG PO TABS: 10.0000 mg | ORAL_TABLET | Freq: Every day | ORAL | Status: DC

## 2012-06-12 MED ORDER — ALBUTEROL SULFATE HFA 108 (90 BASE) MCG/ACT IN AERS: 2.0000 | INHALATION_SPRAY | RESPIRATORY_TRACT | Status: DC | PRN

## 2012-06-12 NOTE — Progress Notes (Signed)
Patient ID: [name], female    DOB: January 02, 1966, 46 y.o.   MRN: [id number]  Asthma Her past medical history is significant for asthma.   04/02/11-44 yo F followed for severe asthma requiring frequent steroids in past, chronic fixed obstruction, hx nasal polyps, allergic rhinitis, hx DVT.[name] Now first pregnancy with concern for complications, started on heparin.  Asthma control not good- blames weather, pollen. Has  Been on daily prednisone and started herself on a burst yesterday from 40 mg daily. Denies fever or infection. Continues Xolair, Dulera, and usually just 10 mg prednisone daily. We discussed asthma meds and pregnancy, looked up Xolair in [employer] for review with her, and asked her to discus with her high risk pregnancy physicians.   07/20/11- 70 yo F followed for severe asthma requiring frequent steroids in past, Failed Xolair, chronic fixed obstruction, hx nasal polyps, allergic rhinitis, hx DVT. Acute-Has not seen difference with Xolair now after 6 months. Has not been able to stayoff prednisone, baseline 10 mg/ day, but has put herself on a burst and taper a couple of times. Discussed steroid side effects again.  Up and down, varies with weather. Heat is hard. Feet swell a little. Last two days she has been more tight in chest. Trying again to get pregnant.  No recurrence of nasal polyps- still sees Dr Erik Obey.  09/03/04-  8 yo F followed for severe asthma (requiring frequent steroids , Failed Xolair, chronic fixed obstruction), hx nasal polyps, allergic rhinitis, hx DVT. She had flu and pneumonia vaccines 2 weeks ago. Feels that she is doing "okay" meaning she is getting by. We had stopped Xolair as unsuccessful. She stopped taking prednisone in the first week of August because she is just desperate to be away from it. Complains of generalized body aching. We discussed adrenal insufficiency and steroid withdrawal again. Noticing some watery rhinorrhea. [name] which she is  using only once daily. PFT- moderate obstructive airways disease with insignificant response to bronchodilator and mild restriction FEV1/FVC 0.54 Echo 08/04/2011-normal LV, EF 55-60% right atrium and pressures normal without pulmonary hypertension.  09/28/11- 89 yo F followed for severe asthma (requiring frequent steroids , Failed Xolair, chronic fixed obstruction), hx nasal polyps, allergic rhinitis, hx DVT. Increased wheeze during the night-used nebulizer 3 times a day before coming here. Not on prednisone. More edema in her legs, left greater than right. Left leg was involved with a DVT years ago. Denies chest pain, palpitation, bloody sputum. Thinks she may have an early cold because nasal discharge is yellow. Lasix 20 mg daily has not been helping her leg edema.  11/26/11- 33 yo F followed for severe asthma (requiring frequent steroids , Failed Xolair, chronic fixed obstruction), hx nasal polyps, allergic rhinitis, hx DVT. Has gotten flu vaccine and pneumonia vaccine at work. Says she feels pretty well for this visit. Has been taking 10 mg of prednisone after her last taper. She expects some wheeze and cough most days. Denies purulent sputum reflux or sinus drainage. Controls ankle edema with Demadex 10 mg taken on most days.  06/12/12- 37 yo F followed for severe asthma (requiring frequent steroids , Failed Xolair, chronic fixed obstruction), hx nasal polyps, allergic rhinitis, hx DVT. Had to recently take prednisone for flare up and now having edema in ankles again-would like demadex to help with this-also K+ as well. The past 7-10 days she has had increased wheeze, chest tightness-not a cold. She asks we rewrite prednisone to specify taper directions so that she can  get it cheaper.  Review of Systems-See HPI Constitutional:   No weight loss, night sweats,  Fevers, chills, fatigue, lassitude. HEENT:   No headaches,  Difficulty swallowing,  Tooth/dental problems,  Sore throat,                No  sneezing, itching, ear ache,, post nasal drip. Some nasal congestion. CV:  No chest pain,  Orthopnea, PND, swelling in lower extremities, anasarca, dizziness, palpitations GI  No heartburn, indigestion, abdominal pain, nausea, vomiting, diarrhea, change in bowel habits, loss of appetite Resp:Usually some shortness of breath with exertion, not at rest.  No excess mucus, no productive cough,  + non-productive cough,  No coughing up of blood.  No change in color of mucus.  + wheezing.   Skin: no rash or lesions. GU: . MS:  No joint pain, + swelling.   Neuro- nothing unusual Psych:  No change in mood or affect. No depression or anxiety.  No memory loss. Objective:   Physical Exam BP 110/82  Pulse 102  Ht 5\' 7"  (1.702 m)  Wt 197 lb (89.359 kg)  BMI 30.85 kg/m2  SpO2 98%  General- Alert, Oriented, Affect-appropriate, Distress- none acute  Relaxed and conversational   overweight Skin- rash-none, lesions- none, excoriation- none Lymphadenopathy- none Head- atraumatic            Eyes- Gross vision intact, PERRLA, conjunctivae clear secretions            Ears- Hearing, canals normal            Nose- Clear, No-Septal dev, mucus, , erosion, perforation. Question small polyp R            Throat- Mallampati II-III, mucosa -trace thrush , drainage- none, tonsils- atrophic Neck- flexible , trachea midline, no stridor , thyroid nl, carotid no bruit Chest - symmetrical excursion , unlabored           Heart/CV- RRR , no murmur , no gallop  , no rub, nl s1 s2                           - JVD- none , edema 1-2+, , stasis changes- none, varices- none           Lung- + transient wheeze, unlabored, cough- none , dullness-none, rub- none           Chest wall-  Abd- tender-no, distended-no, bowel sounds-present, HSM- no Br/ Gen/ Rectal- Not done, not indicated Extrem- cyanosis- none, clubbing, none, atrophy- none, strength- nl. Negative Homan's. Neuro- grossly intact to observation

## 2012-06-12 NOTE — Patient Instructions (Addendum)
Scripts rewritten as requested.  Don't over use the demadex diuretic.   Please call as needed

## 2012-06-17 DIAGNOSIS — R609 Edema, unspecified: Secondary | ICD-10-CM | POA: Insufficient documentation

## 2012-06-17 DIAGNOSIS — R600 Localized edema: Secondary | ICD-10-CM | POA: Insufficient documentation

## 2012-06-17 NOTE — Assessment & Plan Note (Signed)
Exacerbation, nonspecific. Plan-prednisone taper with directions and refills. Steroid talk.

## 2012-06-17 NOTE — Assessment & Plan Note (Signed)
Need to watch right nostril.

## 2012-06-17 NOTE — Assessment & Plan Note (Signed)
There is a chronic fixed obstructive airways component.

## 2012-06-17 NOTE — Assessment & Plan Note (Signed)
Fluid retention from steroids is important and we discussed use of diuretics/potassium. Cannot distinguish between cor pulmonale and peripheral venous insufficiency but I did not think she has heart failure.

## 2012-06-17 NOTE — Assessment & Plan Note (Signed)
>>  ASSESSMENT AND PLAN FOR NASAL POLYP WRITTEN ON 06/17/2012  8:40 PM BY YOUNG, CLINTON D, MD  Need to watch right nostril.

## 2012-07-18 ENCOUNTER — Telehealth: Payer: Self-pay | Admitting: Internal Medicine

## 2012-07-18 DIAGNOSIS — J45909 Unspecified asthma, uncomplicated: Secondary | ICD-10-CM

## 2012-07-18 MED ORDER — PREDNISONE 10 MG PO TABS: ORAL_TABLET | ORAL | Status: DC

## 2012-07-18 MED ORDER — ALBUTEROL SULFATE 2 MG PO TABS: 2.0000 mg | ORAL_TABLET | Freq: Two times a day (BID) | ORAL | Status: DC

## 2012-07-18 NOTE — Telephone Encounter (Signed)
Spoke with pt and notified that rxs requested were sent to pharm. She did request rescue inhaler, and so I left sample up front.

## 2012-07-18 NOTE — Telephone Encounter (Signed)
Per CY-okay to give Prednisone 10 mg #200 take as directed no refills, okay to refill albuterol tablets prn and I believe patient is needing sample of rescue inhaler-okay to give.

## 2012-07-18 NOTE — Telephone Encounter (Signed)
I spoke with the pt and she needs a refill on albuterol tablets and prednisone 10mg . Pt received #100 on 06-12-12. Pt is asking is she could get #200 tablets instead on #100 because when she has to do a taper she runs out quicker and doesn't want to have to call for a refill so often. Please advise if ok to give larger quantity. Thanks. [name], CMA Allergies  Allergen Reactions  . Aspirin   . Penicillins

## 2012-10-09 ENCOUNTER — Telehealth: Payer: Self-pay | Admitting: Internal Medicine

## 2012-10-09 NOTE — Telephone Encounter (Signed)
I spoke with the pt and she is requesting a refill on her prednisone. Refill sent. Pt is aware. Also the pt states she tried to get her life insurance increased and when she was contacted by the company they were going to increase her premuim because her records show she has COPD. Pt states she was never told that she had COPD and she wants to know if she does have this, or if not then she wants it removed from her chart. Please advise.Carron Curie, CMA

## 2012-10-10 NOTE — Telephone Encounter (Signed)
Her pulmonary function tests have shown moderately severe obstructive airways disease with only mild response to bronchodilator. 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

## 2012-10-12 ENCOUNTER — Other Ambulatory Visit: Payer: Self-pay | Admitting: Internal Medicine

## 2012-10-12 NOTE — Telephone Encounter (Signed)
[email or web address]  

## 2012-10-13 NOTE — Telephone Encounter (Signed)
Spoke with pt and notified of recs per CDY Appt with him was scheduled for 09/18/12 at 9:30 am.

## 2012-10-13 NOTE — Telephone Encounter (Signed)
OK, can you please advise where to put her on the schedule, since there are no openings soon, and only blocked spots, thanks!

## 2012-10-13 NOTE — Telephone Encounter (Signed)
Sure :) There is an opening on Wednesday morning if pt can come in then; if not patient may need a late afternoon appt due to work schedule. Please put her on 10-17-12 at 4:15pm slot. Thanks.

## 2012-10-13 NOTE — Telephone Encounter (Signed)
Per CY-lets bring her in for OV with office spirometry and he can discuss the 2 results in person with patient.

## 2012-10-13 NOTE — Telephone Encounter (Signed)
Called and spoke with patient, informed patient of results as listed below per Dr. Maple Hudson.  Patient does not understand how got COPD and why no one has ever told her this.  Patient is requesting to do another PFT and "give it another shot" to "try and get rid of this" because this diagnosis has increased her insurance. Patient is also requesting that Dr. Maple Hudson call her and explain this to her, because :"no office is going to call her and tell her she has COPD." Dr. Maple Hudson please advise   I have called [facility] and they do have patients prednisone.

## 2012-10-13 NOTE — Telephone Encounter (Signed)
Returning call can be reached at [phone].[name]

## 2012-10-18 ENCOUNTER — Encounter: Payer: Self-pay | Admitting: Internal Medicine

## 2012-10-18 ENCOUNTER — Ambulatory Visit (INDEPENDENT_AMBULATORY_CARE_PROVIDER_SITE_OTHER): Payer: BC Managed Care – PPO | Admitting: Internal Medicine

## 2012-10-18 VITALS — BP 138/98 | HR 70 | Ht 67.0 in | Wt 199.4 lb

## 2012-10-18 DIAGNOSIS — J45909 Unspecified asthma, uncomplicated: Secondary | ICD-10-CM

## 2012-10-18 MED ORDER — BUDESONIDE-FORMOTEROL FUMARATE 160-4.5 MCG/ACT IN AERO: 2.0000 | INHALATION_SPRAY | Freq: Two times a day (BID) | RESPIRATORY_TRACT | Status: DC

## 2012-10-18 MED ORDER — LEVALBUTEROL HCL 0.63 MG/3ML IN NEBU
0.6300 mg | INHALATION_SOLUTION | Freq: Once | RESPIRATORY_TRACT | Status: AC
  Administered 2012-10-18: 0.63 mg via RESPIRATORY_TRACT

## 2012-10-18 MED ORDER — METHYLPREDNISOLONE ACETATE 80 MG/ML IJ SUSP
80.0000 mg | Freq: Once | INTRAMUSCULAR | Status: AC
  Administered 2012-10-18: 80 mg via INTRAMUSCULAR

## 2012-10-18 NOTE — Patient Instructions (Addendum)
Sample and script Symbicort 160  Maintenance controller-      2 puffs then rinse mouth, twice every day.   This would compare with Advair or Dulera.  Neb xop  Depo 80  I will send letter describing your current status

## 2012-10-18 NOTE — Progress Notes (Signed)
Patient ID: [name], female    DOB: January 02, 1966, 46 y.o.   MRN: [id number]  Asthma Her past medical history is significant for asthma.   04/02/11-44 yo F followed for severe asthma requiring frequent steroids in past, chronic fixed obstruction, hx nasal polyps, allergic rhinitis, hx DVT.[name] Now first pregnancy with concern for complications, started on heparin.  Asthma control not good- blames weather, pollen. Has  Been on daily prednisone and started herself on a burst yesterday from 40 mg daily. Denies fever or infection. Continues Xolair, Dulera, and usually just 10 mg prednisone daily. We discussed asthma meds and pregnancy, looked up Xolair in [employer] for review with her, and asked her to discus with her high risk pregnancy physicians.   07/20/11- 70 yo F followed for severe asthma requiring frequent steroids in past, Failed Xolair, chronic fixed obstruction, hx nasal polyps, allergic rhinitis, hx DVT. Acute-Has not seen difference with Xolair now after 6 months. Has not been able to stayoff prednisone, baseline 10 mg/ day, but has put herself on a burst and taper a couple of times. Discussed steroid side effects again.  Up and down, varies with weather. Heat is hard. Feet swell a little. Last two days she has been more tight in chest. Trying again to get pregnant.  No recurrence of nasal polyps- still sees Dr Erik Obey.  09/03/04-  8 yo F followed for severe asthma (requiring frequent steroids , Failed Xolair, chronic fixed obstruction), hx nasal polyps, allergic rhinitis, hx DVT. She had flu and pneumonia vaccines 2 weeks ago. Feels that she is doing "okay" meaning she is getting by. We had stopped Xolair as unsuccessful. She stopped taking prednisone in the first week of August because she is just desperate to be away from it. Complains of generalized body aching. We discussed adrenal insufficiency and steroid withdrawal again. Noticing some watery rhinorrhea. [name] which she is  using only once daily. PFT- moderate obstructive airways disease with insignificant response to bronchodilator and mild restriction FEV1/FVC 0.54 Echo 08/04/2011-normal LV, EF 55-60% right atrium and pressures normal without pulmonary hypertension.  09/28/11- 89 yo F followed for severe asthma (requiring frequent steroids , Failed Xolair, chronic fixed obstruction), hx nasal polyps, allergic rhinitis, hx DVT. Increased wheeze during the night-used nebulizer 3 times a day before coming here. Not on prednisone. More edema in her legs, left greater than right. Left leg was involved with a DVT years ago. Denies chest pain, palpitation, bloody sputum. Thinks she may have an early cold because nasal discharge is yellow. Lasix 20 mg daily has not been helping her leg edema.  11/26/11- 33 yo F followed for severe asthma (requiring frequent steroids , Failed Xolair, chronic fixed obstruction), hx nasal polyps, allergic rhinitis, hx DVT. Has gotten flu vaccine and pneumonia vaccine at work. Says she feels pretty well for this visit. Has been taking 10 mg of prednisone after her last taper. She expects some wheeze and cough most days. Denies purulent sputum reflux or sinus drainage. Controls ankle edema with Demadex 10 mg taken on most days.  06/12/12- 37 yo F followed for severe asthma (requiring frequent steroids , Failed Xolair, chronic fixed obstruction), hx nasal polyps, allergic rhinitis, hx DVT. Had to recently take prednisone for flare up and now having edema in ankles again-would like demadex to help with this-also K+ as well. The past 7-10 days she has had increased wheeze, chest tightness-not a cold. She asks we rewrite prednisone to specify taper directions so that she can  get it cheaper.  10/18/12- 49 yo F followed for severe asthma (requiring frequent steroids , Failed Xolair, chronic fixed obstruction), hx nasal polyps, allergic rhinitis, hx DVT. Patient states that she has recently had a flare  up--treated with Prednisone taper, now pred 10mg  daily. Pt has some questions in regards to her medical file--she was denied additional insurance d/t something in her file.  PFT- 09/09/11- FEV1 1.64/ 54%, FEV1/FVC 0.54, FEF25-75% 0.74/ 22%, insignif response to bronchodilator TLC 0.74, DLCO 72%. Moderate obstructive disease, mild restriction, mild reduction of DLCO. Office spirometry- mild obstructive airways disease. FEV1 1.95/73%, FVC 3.01/93%, FEV1/FVC 0.65, FEF 25-75% 1.26/38%. Weather change can trigger a flare. Needed prednisone burst but now back to maintenance 10 mg daily. Has not been off of prednisone in a long time, gets tight when she tries. Wheeze today is typical but she says the effort to blow into this from her head left or wheezier than on arrival. She is not using her Dulera maintenance inhaler and we discussed this. Uses nebulizer occasionally, albuterol tablet twice daily which we discussed, rescue inhaler twice daily. I emphasized the role of maintenance anti-inflammatory control medicines.  Review of Systems-See HPI Constitutional:   No weight loss, night sweats,  Fevers, chills, fatigue, lassitude. HEENT:   No headaches,  Difficulty swallowing,  Tooth/dental problems,  Sore throat,                No sneezing, itching, ear ache,, post nasal drip. Some nasal congestion. CV:  No chest pain,  Orthopnea, PND, swelling in lower extremities, anasarca, dizziness, palpitations GI  No heartburn, indigestion, abdominal pain, nausea, vomiting, diarrhea, change in bowel habits, loss of appetite Resp:Usually some shortness of breath with exertion, not at rest.  No excess mucus, no productive cough,  + non-productive cough,  No coughing up of blood.  No change in color of mucus.  + wheezing.   Skin: no rash or lesions. GU: . MS:  No joint pain, + swelling.   Neuro- nothing unusual Psych:  No change in mood or affect. No depression or anxiety.  No memory loss. Objective:   Physical Exam BP  138/98  Pulse 70  Ht 5\' 7"  (1.702 m)  Wt 199 lb 6.4 oz (90.447 kg)  BMI 31.23 kg/m2  SpO2 98% General- Alert, Oriented, Affect-appropriate, Distress- none acute  Relaxed and conversational   overweight Skin- rash-none, lesions- none, excoriation- none Lymphadenopathy- none Head- atraumatic            Eyes- Gross vision intact, PERRLA, conjunctivae clear secretions            Ears- Hearing, canals normal            Nose- Clear, No-Septal dev, mucus, , erosion, perforation.             Throat- Mallampati II-III, mucosa -trace thrush , drainage- none, tonsils- atrophic Neck- flexible , trachea midline, no stridor , thyroid nl, carotid no bruit Chest - symmetrical excursion , unlabored           Heart/CV- RRR , no murmur , no gallop  , no rub, nl s1 s2                           - JVD- none , edema 1-2+, , stasis changes- none, varices- none           Lung- + bilateral inspiratory and expiratory wheeze,  unlabored, cough- none , dullness-none, rub- none  Chest wall-  Abd-  Br/ Gen/ Rectal- Not done, not indicated Extrem- cyanosis- none, clubbing, none, atrophy- none, strength- nl.  Neuro- grossly intact to observation

## 2012-10-29 ENCOUNTER — Encounter: Payer: Self-pay | Admitting: Internal Medicine

## 2012-10-29 NOTE — Assessment & Plan Note (Addendum)
Chronic asthma with steroid dependence. There is a small fixed component Appropriate use of maintenance controller medications is very important so we can reduce systemic steroids. Plan-sample of Symbicort to try instead of Dulera. Check on cost for her. She asks for a letter to insurance explaining this is chronic asthma rather than "COPD"

## 2012-11-09 ENCOUNTER — Telehealth: Payer: Self-pay | Admitting: Internal Medicine

## 2012-11-09 MED ORDER — ALBUTEROL SULFATE 2 MG PO TABS: 2.0000 mg | ORAL_TABLET | Freq: Two times a day (BID) | ORAL | Status: DC

## 2012-11-09 NOTE — Telephone Encounter (Signed)
Called and spoke with pt and she stated that she needs the albuterol tablets sent to [facility].  Pt is now using this pharmacy.  This rx has been sent in and pt is aware. Nothing further is needed.

## 2012-11-09 NOTE — Telephone Encounter (Signed)
LMOMTCB X1  Last refilled 07/18/12 #60 x 11 refills to [facility]

## 2013-01-16 ENCOUNTER — Ambulatory Visit: Payer: BC Managed Care – PPO | Admitting: Internal Medicine

## 2013-01-24 ENCOUNTER — Telehealth: Payer: Self-pay | Admitting: Internal Medicine

## 2013-01-24 MED ORDER — ALBUTEROL SULFATE HFA 108 (90 BASE) MCG/ACT IN AERS: 2.0000 | INHALATION_SPRAY | RESPIRATORY_TRACT | Status: DC | PRN

## 2013-01-24 MED ORDER — MOMETASONE FURO-FORMOTEROL FUM 200-5 MCG/ACT IN AERO: 2.0000 | INHALATION_SPRAY | Freq: Two times a day (BID) | RESPIRATORY_TRACT | Status: DC

## 2013-01-24 NOTE — Telephone Encounter (Signed)
Pt aware that sample of each at front for her and that she is due for OV now; must make and keep appt with CY. Pt states that her husband can not add her to his insurance until March 2014 and will make an appt with CY after that. Samples documented in chart.

## 2013-04-17 ENCOUNTER — Telehealth: Payer: Self-pay | Admitting: Internal Medicine

## 2013-04-17 MED ORDER — PREDNISONE 10 MG PO TABS: 10.0000 mg | ORAL_TABLET | ORAL | Status: DC

## 2013-04-17 NOTE — Telephone Encounter (Signed)
Rx has been sent in. Pt is aware. 

## 2013-04-17 NOTE — Telephone Encounter (Signed)
Pt requesting prednisone 10 mg #200 take as directed and also  A dulera sample. [facility] on [address].  Allergies  Allergen Reactions  . Aspirin   . Penicillins    Dr Maple Hudson please advise Thank you.

## 2013-04-17 NOTE — Telephone Encounter (Signed)
Ok to refill prednisone 

## 2013-04-30 ENCOUNTER — Telehealth: Payer: Self-pay | Admitting: Internal Medicine

## 2013-04-30 MED ORDER — AZITHROMYCIN 250 MG PO TABS: ORAL_TABLET | ORAL | Status: DC

## 2013-04-30 NOTE — Telephone Encounter (Signed)
I spoke with pt and is aware RX has been sent. Nothing further was needed

## 2013-04-30 NOTE — Telephone Encounter (Signed)
Ok Zpak

## 2013-04-30 NOTE — Telephone Encounter (Signed)
I spoke with pt. She c/o cough w/ yellow phlem, hoarseness x Friday. No wheezing, no chest tx, no increase SOB. She is requesting to have a ZPAK called inf or her. Please advise Dr. Maple Hudson thanks Last OV 10/18/12 No pending appt Allergies  Allergen Reactions  . Aspirin   . Penicillins

## 2013-06-27 ENCOUNTER — Telehealth: Payer: Self-pay | Admitting: Internal Medicine

## 2013-06-27 MED ORDER — ALBUTEROL SULFATE HFA 108 (90 BASE) MCG/ACT IN AERS: 2.0000 | INHALATION_SPRAY | RESPIRATORY_TRACT | Status: DC | PRN

## 2013-06-27 NOTE — Telephone Encounter (Signed)
I spoke with pt. She stated she is getting ready to go out of town for 4 weeks. She will call once she gets back. She is aware rx filled this time but needs OV for further refills. Nothing further was needed

## 2013-09-21 ENCOUNTER — Telehealth: Payer: Self-pay | Admitting: Internal Medicine

## 2013-09-21 NOTE — Telephone Encounter (Signed)
Error.  Duplicate message.  [name] °- °

## 2013-09-21 NOTE — Telephone Encounter (Signed)
Called and spoke with pt and she is aware of appt with CY on 10/8 at 945.  Nothing further is needed.

## 2013-09-21 NOTE — Telephone Encounter (Signed)
Called and spoke with pt and she stated that she needs appt on 10/8 with CY.  This is for her check up and her asthma has been flaring up.  Pt is aware that CY has no openings that day but wanted message sent to him.  CY please advise. Thanks  Allergies  Allergen Reactions  . Aspirin   . Penicillins    Last ov---10/18/2012

## 2013-09-21 NOTE — Telephone Encounter (Signed)
Okay to put patient on Wednesday 09-26-13-slots are open.

## 2013-09-26 ENCOUNTER — Ambulatory Visit (INDEPENDENT_AMBULATORY_CARE_PROVIDER_SITE_OTHER): Payer: BC Managed Care – PPO | Admitting: Internal Medicine

## 2013-09-26 ENCOUNTER — Encounter: Payer: Self-pay | Admitting: Internal Medicine

## 2013-09-26 VITALS — BP 126/70 | HR 62 | Ht 67.0 in | Wt 192.8 lb

## 2013-09-26 DIAGNOSIS — J302 Other seasonal allergic rhinitis: Secondary | ICD-10-CM

## 2013-09-26 DIAGNOSIS — J309 Allergic rhinitis, unspecified: Secondary | ICD-10-CM

## 2013-09-26 DIAGNOSIS — Z23 Encounter for immunization: Secondary | ICD-10-CM

## 2013-09-26 DIAGNOSIS — J45909 Unspecified asthma, uncomplicated: Secondary | ICD-10-CM

## 2013-09-26 MED ORDER — AZITHROMYCIN 250 MG PO TABS: 250.0000 mg | ORAL_TABLET | Freq: Every day | ORAL | Status: DC

## 2013-09-26 MED ORDER — ALBUTEROL SULFATE 2 MG PO TABS: 2.0000 mg | ORAL_TABLET | Freq: Two times a day (BID) | ORAL | Status: DC

## 2013-09-26 MED ORDER — TORSEMIDE 10 MG PO TABS: ORAL_TABLET | ORAL | Status: DC

## 2013-09-26 MED ORDER — METHYLPREDNISOLONE ACETATE 80 MG/ML IJ SUSP
80.0000 mg | Freq: Once | INTRAMUSCULAR | Status: AC
  Administered 2013-09-26: 80 mg via INTRAMUSCULAR

## 2013-09-26 MED ORDER — POTASSIUM CHLORIDE ER 8 MEQ PO TBCR: 8.0000 meq | EXTENDED_RELEASE_TABLET | ORAL | Status: DC

## 2013-09-26 MED ORDER — PREDNISONE 10 MG PO TABS: ORAL_TABLET | ORAL | Status: DC

## 2013-09-26 MED ORDER — FLUTICASONE FUROATE-VILANTEROL 100-25 MCG/INH IN AEPB: 1.0000 | INHALATION_SPRAY | Freq: Every day | RESPIRATORY_TRACT | Status: DC

## 2013-09-26 MED ORDER — LEVALBUTEROL HCL 0.63 MG/3ML IN NEBU
0.6300 mg | INHALATION_SOLUTION | Freq: Once | RESPIRATORY_TRACT | Status: AC
  Administered 2013-09-26: 0.63 mg via RESPIRATORY_TRACT

## 2013-09-26 NOTE — Progress Notes (Signed)
Patient ID: [name], female    DOB: January 02, 1966, 47 y.o.   MRN: [id number]  Asthma Her past medical history is significant for asthma.   04/02/11-44 yo F followed for severe asthma requiring frequent steroids in past, chronic fixed obstruction, hx nasal polyps, allergic rhinitis, hx DVT.[name] Now first pregnancy with concern for complications, started on heparin.  Asthma control not good- blames weather, pollen. Has  Been on daily prednisone and started herself on a burst yesterday from 40 mg daily. Denies fever or infection. Continues Xolair, Dulera, and usually just 10 mg prednisone daily. We discussed asthma meds and pregnancy, looked up Xolair in [employer] for review with her, and asked her to discus with her high risk pregnancy physicians.   07/20/11- 70 yo F followed for severe asthma requiring frequent steroids in past, Failed Xolair, chronic fixed obstruction, hx nasal polyps, allergic rhinitis, hx DVT. Acute-Has not seen difference with Xolair now after 6 months. Has not been able to stayoff prednisone, baseline 10 mg/ day, but has put herself on a burst and taper a couple of times. Discussed steroid side effects again.  Up and down, varies with weather. Heat is hard. Feet swell a little. Last two days she has been more tight in chest. Trying again to get pregnant.  No recurrence of nasal polyps- still sees Dr Erik Obey.  09/03/04-  8 yo F followed for severe asthma (requiring frequent steroids , Failed Xolair, chronic fixed obstruction), hx nasal polyps, allergic rhinitis, hx DVT. She had flu and pneumonia vaccines 2 weeks ago. Feels that she is doing "okay" meaning she is getting by. We had stopped Xolair as unsuccessful. She stopped taking prednisone in the first week of August because she is just desperate to be away from it. Complains of generalized body aching. We discussed adrenal insufficiency and steroid withdrawal again. Noticing some watery rhinorrhea. [name] which she is  using only once daily. PFT- moderate obstructive airways disease with insignificant response to bronchodilator and mild restriction FEV1/FVC 0.54 Echo 08/04/2011-normal LV, EF 55-60% right atrium and pressures normal without pulmonary hypertension.  09/28/11- 89 yo F followed for severe asthma (requiring frequent steroids , Failed Xolair, chronic fixed obstruction), hx nasal polyps, allergic rhinitis, hx DVT. Increased wheeze during the night-used nebulizer 3 times a day before coming here. Not on prednisone. More edema in her legs, left greater than right. Left leg was involved with a DVT years ago. Denies chest pain, palpitation, bloody sputum. Thinks she may have an early cold because nasal discharge is yellow. Lasix 20 mg daily has not been helping her leg edema.  11/26/11- 33 yo F followed for severe asthma (requiring frequent steroids , Failed Xolair, chronic fixed obstruction), hx nasal polyps, allergic rhinitis, hx DVT. Has gotten flu vaccine and pneumonia vaccine at work. Says she feels pretty well for this visit. Has been taking 10 mg of prednisone after her last taper. She expects some wheeze and cough most days. Denies purulent sputum reflux or sinus drainage. Controls ankle edema with Demadex 10 mg taken on most days.  06/12/12- 37 yo F followed for severe asthma (requiring frequent steroids , Failed Xolair, chronic fixed obstruction), hx nasal polyps, allergic rhinitis, hx DVT. Had to recently take prednisone for flare up and now having edema in ankles again-would like demadex to help with this-also K+ as well. The past 7-10 days she has had increased wheeze, chest tightness-not a cold. She asks we rewrite prednisone to specify taper directions so that she can  get it cheaper.  10/18/12- 30 yo F followed for severe asthma (requiring frequent steroids , Failed Xolair, chronic fixed obstruction), hx nasal polyps, allergic rhinitis, hx DVT. Patient states that she has recently had a flare  up--treated with Prednisone taper, now pred 10mg  daily. Pt has some questions in regards to her medical file--she was denied additional insurance d/t something in her file.  PFT- 09/09/11- FEV1 1.64/ 54%, FEV1/FVC 0.54, FEF25-75% 0.74/ 22%, insignif response to bronchodilator TLC 0.74, DLCO 72%. Moderate obstructive disease, mild restriction, mild reduction of DLCO. Office spirometry- mild obstructive airways disease. FEV1 1.95/73%, FVC 3.01/93%, FEV1/FVC 0.65, FEF 25-75% 1.26/38%. Weather change can trigger a flare. Needed prednisone burst but now back to maintenance 10 mg daily. Has not been off of prednisone in a long time, gets tight when she tries. Wheeze today is typical but she says the effort to blow into this from her head left or wheezier than on arrival. She is not using her Dulera maintenance inhaler and we discussed this. Uses nebulizer occasionally, albuterol tablet twice daily which we discussed, rescue inhaler twice daily. I emphasized the role of maintenance anti-inflammatory control medicines.  09/26/13- 73 yo F followed for severe asthma (requiring frequent steroids , Failed Xolair, chronic fixed obstruction), hx nasal polyps, allergic rhinitis, hx DVT FOLLOWS FOR: cough-productive-yellow and clear at times; wheezing, and slight SOB as well. Returned from [facility] [facility] with increased cough, some wheeze and clear phlegm but no fever or sore throat Out of prednisone and feels she needs taper. I talked with her about cumulative prednisone side effects again.  Review of Systems-See HPI Constitutional:   No weight loss, night sweats,  Fevers, chills, fatigue, lassitude. HEENT:   No headaches,  Difficulty swallowing,  Tooth/dental problems,  Sore throat,                No sneezing, itching, ear ache,, post nasal drip. Some nasal congestion. CV:  No chest pain,  Orthopnea, PND, swelling in lower extremities, anasarca, dizziness, palpitations GI  No heartburn, indigestion, abdominal pain, nausea,  vomiting, diarrhea, change in bowel habits, loss of appetite Resp:Usually some shortness of breath with exertion, not at rest.  No excess mucus, no productive cough,          + non-productive cough,  No coughing up of blood.  No change in color of mucus.  + wheezing.   Skin: no rash or lesions. GU: . MS:  No joint pain, + swelling.   Neuro- nothing unusual Psych:  No change in mood or affect. No depression or anxiety.  No memory loss. Objective:   Physical Exam  General- Alert, Oriented, Affect-appropriate, Distress- none acute  Relaxed and conversational   overweight Skin- rash-none, lesions- none, excoriation- none Lymphadenopathy- none Head- atraumatic            Eyes- Gross vision intact, PERRLA, conjunctivae clear secretions            Ears- Hearing, canals normal            Nose- Clear, No-Septal dev, mucus, , erosion, perforation.             Throat- Mallampati II-III, mucosa -trace thrush , drainage- none, tonsils- atrophic Neck- flexible , trachea midline, no stridor , thyroid nl, carotid no bruit Chest - symmetrical excursion , unlabored           Heart/CV- RRR , no murmur , no gallop  , no rub, nl s1 s2                           -  JVD- none , edema 1-2+, , stasis changes- none, varices- none           Lung- + bilateral inspiratory and expiratory wheeze,  unlabored, cough- none , dullness-none, rub- none           Chest wall-  Abd-  Br/ Gen/ Rectal- Not done, not indicated Extrem- cyanosis- none, clubbing, none, atrophy- none, strength- nl.  Neuro- grossly intact to observation

## 2013-09-26 NOTE — Patient Instructions (Addendum)
Flu vax  Neb xop 0.63  Depo 80  Script prednisone sent  Scripts refilling albuterol tablets, potassium and torsemide diuretic sent  Sample Breo Ellipta to try 1 puff, then rinse mouth, once daily         Try this instead of Dulera for comparison  Zpak sent to pharmacy

## 2013-10-11 NOTE — Assessment & Plan Note (Signed)
Milder symptoms with recurrent lower respiratory exacerbation

## 2013-10-11 NOTE — Assessment & Plan Note (Signed)
Acute exacerbation of chronic asthma, nonspecific trigger. Plan-prednisone taper, Xopenex neb, Depo-Medrol, flu vaccine

## 2013-12-05 ENCOUNTER — Ambulatory Visit (INDEPENDENT_AMBULATORY_CARE_PROVIDER_SITE_OTHER): Payer: BC Managed Care – PPO | Admitting: Internal Medicine

## 2013-12-05 ENCOUNTER — Encounter: Payer: Self-pay | Admitting: Internal Medicine

## 2013-12-05 VITALS — BP 116/76 | HR 70 | Ht 67.0 in | Wt 198.0 lb

## 2013-12-05 DIAGNOSIS — J45909 Unspecified asthma, uncomplicated: Secondary | ICD-10-CM

## 2013-12-05 DIAGNOSIS — J4541 Moderate persistent asthma with (acute) exacerbation: Secondary | ICD-10-CM

## 2013-12-05 DIAGNOSIS — J45901 Unspecified asthma with (acute) exacerbation: Secondary | ICD-10-CM

## 2013-12-05 MED ORDER — METHYLPREDNISOLONE ACETATE 80 MG/ML IJ SUSP
80.0000 mg | Freq: Once | INTRAMUSCULAR | Status: AC
  Administered 2013-12-05: 80 mg via INTRAMUSCULAR

## 2013-12-05 MED ORDER — PREDNISONE 10 MG PO TABS: ORAL_TABLET | ORAL | Status: DC

## 2013-12-05 MED ORDER — LEVALBUTEROL HCL 0.63 MG/3ML IN NEBU
0.6300 mg | INHALATION_SOLUTION | Freq: Once | RESPIRATORY_TRACT | Status: AC
  Administered 2013-12-05: 0.63 mg via RESPIRATORY_TRACT

## 2013-12-05 MED ORDER — POTASSIUM CHLORIDE ER 8 MEQ PO TBCR: 8.0000 meq | EXTENDED_RELEASE_TABLET | ORAL | Status: DC

## 2013-12-05 MED ORDER — AZITHROMYCIN 250 MG PO TABS: 250.0000 mg | ORAL_TABLET | Freq: Every day | ORAL | Status: DC

## 2013-12-05 MED ORDER — FLUTICASONE FUROATE-VILANTEROL 100-25 MCG/INH IN AEPB: 1.0000 | INHALATION_SPRAY | Freq: Every day | RESPIRATORY_TRACT | Status: DC

## 2013-12-05 NOTE — Patient Instructions (Signed)
Scripts printed for [employer] ( with disccount card), potassium, prednisone, Zpak.  Use the Zpak if you begin to get more like a bacterial infection with green, fever, etc.  Neb xop 0.63  Depo  80

## 2013-12-05 NOTE — Progress Notes (Signed)
Patient ID: [name], female    DOB: January 02, 1966, 47 y.o.   MRN: [id number]  Asthma Her past medical history is significant for asthma.   04/02/11-44 yo F followed for severe asthma requiring frequent steroids in past, chronic fixed obstruction, hx nasal polyps, allergic rhinitis, hx DVT.[name] Now first pregnancy with concern for complications, started on heparin.  Asthma control not good- blames weather, pollen. Has  Been on daily prednisone and started herself on a burst yesterday from 40 mg daily. Denies fever or infection. Continues Xolair, Dulera, and usually just 10 mg prednisone daily. We discussed asthma meds and pregnancy, looked up Xolair in [employer] for review with her, and asked her to discus with her high risk pregnancy physicians.   07/20/11- 70 yo F followed for severe asthma requiring frequent steroids in past, Failed Xolair, chronic fixed obstruction, hx nasal polyps, allergic rhinitis, hx DVT. Acute-Has not seen difference with Xolair now after 6 months. Has not been able to stayoff prednisone, baseline 10 mg/ day, but has put herself on a burst and taper a couple of times. Discussed steroid side effects again.  Up and down, varies with weather. Heat is hard. Feet swell a little. Last two days she has been more tight in chest. Trying again to get pregnant.  No recurrence of nasal polyps- still sees Dr Erik Obey.  09/03/04-  8 yo F followed for severe asthma (requiring frequent steroids , Failed Xolair, chronic fixed obstruction), hx nasal polyps, allergic rhinitis, hx DVT. She had flu and pneumonia vaccines 2 weeks ago. Feels that she is doing "okay" meaning she is getting by. We had stopped Xolair as unsuccessful. She stopped taking prednisone in the first week of August because she is just desperate to be away from it. Complains of generalized body aching. We discussed adrenal insufficiency and steroid withdrawal again. Noticing some watery rhinorrhea. [name] which she is  using only once daily. PFT- moderate obstructive airways disease with insignificant response to bronchodilator and mild restriction FEV1/FVC 0.54 Echo 08/04/2011-normal LV, EF 55-60% right atrium and pressures normal without pulmonary hypertension.  09/28/11- 89 yo F followed for severe asthma (requiring frequent steroids , Failed Xolair, chronic fixed obstruction), hx nasal polyps, allergic rhinitis, hx DVT. Increased wheeze during the night-used nebulizer 3 times a day before coming here. Not on prednisone. More edema in her legs, left greater than right. Left leg was involved with a DVT years ago. Denies chest pain, palpitation, bloody sputum. Thinks she may have an early cold because nasal discharge is yellow. Lasix 20 mg daily has not been helping her leg edema.  11/26/11- 33 yo F followed for severe asthma (requiring frequent steroids , Failed Xolair, chronic fixed obstruction), hx nasal polyps, allergic rhinitis, hx DVT. Has gotten flu vaccine and pneumonia vaccine at work. Says she feels pretty well for this visit. Has been taking 10 mg of prednisone after her last taper. She expects some wheeze and cough most days. Denies purulent sputum reflux or sinus drainage. Controls ankle edema with Demadex 10 mg taken on most days.  06/12/12- 37 yo F followed for severe asthma (requiring frequent steroids , Failed Xolair, chronic fixed obstruction), hx nasal polyps, allergic rhinitis, hx DVT. Had to recently take prednisone for flare up and now having edema in ankles again-would like demadex to help with this-also K+ as well. The past 7-10 days she has had increased wheeze, chest tightness-not a cold. She asks we rewrite prednisone to specify taper directions so that she can  get it cheaper.  10/18/12- 38 yo F followed for severe asthma (requiring frequent steroids , Failed Xolair, chronic fixed obstruction), hx nasal polyps, allergic rhinitis, hx DVT. Patient states that she has recently had a flare  up--treated with Prednisone taper, now pred 10mg  daily. Pt has some questions in regards to her medical file--she was denied additional insurance d/t something in her file.  PFT- 09/09/11- FEV1 1.64/ 54%, FEV1/FVC 0.54, FEF25-75% 0.74/ 22%, insignif response to bronchodilator TLC 0.74, DLCO 72%. Moderate obstructive disease, mild restriction, mild reduction of DLCO. Office spirometry- mild obstructive airways disease. FEV1 1.95/73%, FVC 3.01/93%, FEV1/FVC 0.65, FEF 25-75% 1.26/38%. Weather change can trigger a flare. Needed prednisone burst but now back to maintenance 10 mg daily. Has not been off of prednisone in a long time, gets tight when she tries. Wheeze today is typical but she says the effort to blow into this from her head left or wheezier than on arrival. She is not using her Dulera maintenance inhaler and we discussed this. Uses nebulizer occasionally, albuterol tablet twice daily which we discussed, rescue inhaler twice daily. I emphasized the role of maintenance anti-inflammatory control medicines.  09/26/13- 44 yo F followed for severe asthma (requiring frequent steroids , Failed Xolair, chronic fixed obstruction), hx nasal polyps, allergic rhinitis, hx DVT FOLLOWS FOR: cough-productive-yellow and clear at times; wheezing, and slight SOB as well. Returned from [employer] [facility] with increased cough, some wheeze and clear phlegm but no fever or sore throat Out of prednisone and feels she needs taper. I talked with her about cumulative prednisone side effects again.   12/05/13- 4 yo F followed for severe asthma (requiring frequent steroids , Failed Xolair, chronic fixed obstruction), hx nasal polyps, allergic rhinitis, hx DVT FOLLOWS FOR: Breathing has gotten worse since last OV. Reports severe wheezing, SOB, chest tightness. Onset was 3 days ago. Acute illness with increased cough, wheeze, white or yellow sputum but denies fever, sore throat and does not feel "sick". Currently tapering  prednisone.  Review of Systems-See HPI Constitutional:   No weight loss, night sweats,  Fevers, chills, fatigue, lassitude. HEENT:   No headaches,  Difficulty swallowing,  Tooth/dental problems,  Sore throat,                No sneezing, itching, ear ache,, post nasal drip. Some nasal congestion. CV:  No chest pain,  Orthopnea, PND, swelling in lower extremities, anasarca, dizziness, palpitations GI  No heartburn, indigestion, abdominal pain, nausea, vomiting, diarrhea, change in bowel habits, loss of appetite Resp:Usually some shortness of breath with exertion, not at rest.  No excess mucus, + productive cough,          + non-productive cough,  No coughing up of blood.  + change in color of mucus.  + wheezing.   Skin: no rash or lesions. GU: . MS:  No joint pain, + swelling.   Neuro- nothing unusual Psych:  No change in mood or affect. No depression or anxiety.  No memory loss. Objective:   Physical Exam  General- Alert, Oriented, Affect-appropriate, Distress- none acute  Relaxed and conversational   overweight Skin- rash-none, lesions- none, excoriation- none Lymphadenopathy- none Head- atraumatic            Eyes- Gross vision intact, PERRLA, conjunctivae clear secretions            Ears- Hearing, canals normal            Nose- Clear, No-Septal dev, mucus, , erosion, perforation.  Throat- Mallampati II-III, mucosa -trace thrush , drainage- none, tonsils- atrophic Neck- flexible , trachea midline, no stridor , thyroid nl, carotid no bruit Chest - symmetrical excursion , unlabored           Heart/CV- RRR , no murmur , no gallop  , no rub, nl s1 s2                           - JVD- none , edema 1-2+, , stasis changes- none, varices- none           Lung- + bilateral inspiratory and expiratory wheeze,  unlabored, cough- none , dullness-none, rub- none           Chest wall-  Abd-  Br/ Gen/ Rectal- Not done, not indicated Extrem- cyanosis- none, clubbing, none, atrophy- none,  strength- nl.  Neuro- grossly intact to observation

## 2013-12-27 NOTE — Assessment & Plan Note (Addendum)
Chronic obstructive asthma-failed Xolair PFT 09/09/2011-FEV1 1.64/54%, FEV1/FVC 0.54 FEF 25-75% 0.74 (22%), insignificant response to bronchodilator, TLC 74%,  DLCO 72% Echocardiogram 08/04/2011 EF 55-60% LV normal no pulmonary hypertension. Office spirometry- 10/18/12- mild obstructive airways disease. FEV1 1.95/73%, FVC 3.01/93%, FEV1/FVC 0.65, FEF 25-75% 1.26/38% Imp-Asthma moderate persistent complicated, steroid dependent Plan-nebulizer Xopenex, Depo-Medrol, sample and prescription for Breo to try instead of [facility]

## 2014-01-31 ENCOUNTER — Other Ambulatory Visit: Payer: Self-pay | Admitting: Obstetrics and Gynecology

## 2014-02-26 ENCOUNTER — Telehealth: Payer: Self-pay | Admitting: Internal Medicine

## 2014-02-26 NOTE — Telephone Encounter (Signed)
Samples of Breo and Albuterol have been placed up front for pick up. Pt is aware. Nothing further is needed.

## 2014-05-29 ENCOUNTER — Telehealth: Payer: Self-pay | Admitting: Internal Medicine

## 2014-05-29 MED ORDER — PREDNISONE 10 MG PO TABS: ORAL_TABLET | ORAL | Status: DC

## 2014-05-29 NOTE — Telephone Encounter (Signed)
I called and left message on voicemail that Rx has been sent for Pred and if patient has any questions or concerns to call our office.

## 2014-05-29 NOTE — Telephone Encounter (Signed)
Ok to refill the prednisone 

## 2014-05-29 NOTE — Addendum Note (Signed)
Addended by: Lorane Gell on: 05/29/2014 04:57 PM   Modules accepted: Orders

## 2014-05-29 NOTE — Telephone Encounter (Signed)
LMTCB-need to know if patient is feeling sick,trouble breathing, etc---why she wants Pred taper refilled as well as let patient know we do not have any samples at this time but we can send a RX for her. Thanks.

## 2014-05-29 NOTE — Telephone Encounter (Signed)
Spoke with patient-she states CY gives her Pred taper Rx #200 to have on hand and she is running low-would like refill. Pt aware that CY is out of the office this afternoon and will return in AM; will await a call back from our office then. Also, patient knows we do not have any samples to give her of albuterol HFA-no rx requested to send to pharmacy.

## 2014-06-04 ENCOUNTER — Telehealth: Payer: Self-pay | Admitting: Internal Medicine

## 2014-06-04 MED ORDER — DOXYCYCLINE HYCLATE 100 MG PO TABS
ORAL_TABLET | ORAL | Status: DC
Start: 2014-06-04 — End: 2014-07-19

## 2014-06-04 NOTE — Telephone Encounter (Signed)
Called spoke with pt. C/o runny nose, nasal cong, blowing out yellow-green phlem. She wants ABX called in for her. Denies any wheezing, no chest tx, no PND, no cough. Please advise CDY thanks  Allergies  Allergen Reactions  . Aspirin   . Penicillins      Current Outpatient Prescriptions on File Prior to Visit  Medication Sig Dispense Refill  . albuterol (PROVENTIL) (2.5 MG/3ML) 0.083% nebulizer solution Use 1 vial in nebulizer as needed dx:496  150 mL  PRN  . albuterol (PROVENTIL) 2 MG tablet Take 1 tablet (2 mg total) by mouth 2 (two) times daily.  60 tablet  prn  . albuterol (VENTOLIN HFA) 108 (90 BASE) MCG/ACT inhaler Inhale 2 puffs into the lungs every 4 (four) hours as needed for wheezing or shortness of breath.  1 Inhaler  0  . azithromycin (ZITHROMAX) 250 MG tablet Take 1 tablet (250 mg total) by mouth daily.  6 tablet  0  . FLUoxetine (PROZAC) 10 MG tablet Take 10 mg by mouth daily.      . Fluticasone Furoate-Vilanterol (BREO ELLIPTA) 100-25 MCG/INH AEPB Inhale 1 puff into the lungs daily. Rinse mouth after use  1 each  prn  . ipratropium (ATROVENT) 0.02 % nebulizer solution Use 1 vial in nebulizer as needed dx. 496  150 mL  PRN  . potassium chloride (KLOR-CON) 8 MEQ tablet Take 1 tablet (8 mEq total) by mouth 1 day or 1 dose. With demadex  30 tablet  5  . predniSONE (DELTASONE) 10 MG tablet 4 X 2 DAYS, 3 X 2 DAYS, 2 X 2 DAYS, 1 X 2 DAYS, then as directed  200 tablet  0  . Prenatal Vit-Fe Sulfate-FA (PRENATAL VITAMIN PO) Take 1 capsule by mouth daily.      [name] torsemide (DEMADEX) 10 MG tablet 1 daily as needed- diuretic  30 tablet  5  . triamcinolone (NASACORT) 55 MCG/ACT nasal inhaler 2 sprays by Nasal route daily.        No current facility-administered medications on file prior to visit.

## 2014-06-04 NOTE — Telephone Encounter (Signed)
Pt aware of recs. RX called in. Nothing further needed 

## 2014-06-04 NOTE — Telephone Encounter (Signed)
Offer doxycycline 100 mg, # 8, 2 today then one daily 

## 2014-07-05 ENCOUNTER — Telehealth: Payer: Self-pay | Admitting: Internal Medicine

## 2014-07-05 MED ORDER — ALBUTEROL SULFATE HFA 108 (90 BASE) MCG/ACT IN AERS: 2.0000 | INHALATION_SPRAY | RESPIRATORY_TRACT | Status: DC | PRN

## 2014-07-05 NOTE — Telephone Encounter (Signed)
LMTCB

## 2014-07-05 NOTE — Telephone Encounter (Signed)
She can ask her friends who they see and like She can check those names with her insurance to see if they participate with her insurance Ok to ask our [name] for referral to Primary Care, understanding that [place] has had to absorb a lot of patients lately

## 2014-07-05 NOTE — Telephone Encounter (Signed)
Spoke with the pt  I advised that we do not have any samples of rescue inhalers at this time  Rx was sent to pharm for ventolin  Pt then asks if CDY can refer her to a PCP  I advised that referral is typically not needed for PCP  She insists that we check with CDY  Please advise, thanks!

## 2014-07-08 MED ORDER — ALBUTEROL SULFATE HFA 108 (90 BASE) MCG/ACT IN AERS: 1.0000 | INHALATION_SPRAY | Freq: Four times a day (QID) | RESPIRATORY_TRACT | Status: DC | PRN

## 2014-07-08 NOTE — Telephone Encounter (Signed)
I spoke with the pt and advised. She then states that the ventolin is $70 and asks if there is something cheaper. I advised we have not way of knowing what will be cheaper but I can send in rx for proair to see if it is. She states understanding. rx sent. Perry Bing, CMA

## 2014-07-19 ENCOUNTER — Telehealth: Payer: Self-pay | Admitting: Internal Medicine

## 2014-07-19 ENCOUNTER — Other Ambulatory Visit: Payer: Self-pay | Admitting: Internal Medicine

## 2014-07-19 MED ORDER — DOXYCYCLINE HYCLATE 100 MG PO TABS: ORAL_TABLET | ORAL | Status: DC

## 2014-07-19 NOTE — Telephone Encounter (Signed)
Per CY---  Ok for refill of the doxy with 2 additional refills.  This has been sent to the pharmacy and the pt is aware.

## 2014-07-19 NOTE — Telephone Encounter (Signed)
Called and spoke with pt and she stated that CY normally gives her refills of her doxy---this last time no refills were given.  Pt is having some cough with yellow sputum at times.  Pt is requesting a refill of the doxy.  CY please advise. Thanks  Last ov--12/05/2013 No pending appts.  Allergies  Allergen Reactions  . Aspirin   . Penicillins     Current Outpatient Prescriptions on File Prior to Visit  Medication Sig Dispense Refill  . albuterol (PROAIR HFA) 108 (90 BASE) MCG/ACT inhaler Inhale 1-2 puffs into the lungs every 6 (six) hours as needed for wheezing or shortness of breath.  1 Inhaler  0  . albuterol (PROVENTIL) (2.5 MG/3ML) 0.083% nebulizer solution Use 1 vial in nebulizer as needed dx:496  150 mL  PRN  . albuterol (PROVENTIL) 2 MG tablet Take 1 tablet (2 mg total) by mouth 2 (two) times daily.  60 tablet  prn  . azithromycin (ZITHROMAX) 250 MG tablet Take 1 tablet (250 mg total) by mouth daily.  6 tablet  0  . doxycycline (VIBRA-TABS) 100 MG tablet Take 2 today then 1 daily until gone  8 tablet  0  . FLUoxetine (PROZAC) 10 MG tablet Take 10 mg by mouth daily.      . Fluticasone Furoate-Vilanterol (BREO ELLIPTA) 100-25 MCG/INH AEPB Inhale 1 puff into the lungs daily. Rinse mouth after use  1 each  prn  . ipratropium (ATROVENT) 0.02 % nebulizer solution Use 1 vial in nebulizer as needed dx. 496  150 mL  PRN  . potassium chloride (KLOR-CON) 8 MEQ tablet Take 1 tablet (8 mEq total) by mouth 1 day or 1 dose. With demadex  30 tablet  5  . predniSONE (DELTASONE) 10 MG tablet 4 X 2 DAYS, 3 X 2 DAYS, 2 X 2 DAYS, 1 X 2 DAYS, then as directed  200 tablet  0  . Prenatal Vit-Fe Sulfate-FA (PRENATAL VITAMIN PO) Take 1 capsule by mouth daily.      [name] torsemide (DEMADEX) 10 MG tablet 1 daily as needed- diuretic  30 tablet  5  . triamcinolone (NASACORT) 55 MCG/ACT nasal inhaler 2 sprays by Nasal route daily.        No current facility-administered medications on file prior to visit.

## 2014-10-08 ENCOUNTER — Telehealth: Payer: Self-pay | Admitting: Internal Medicine

## 2014-10-08 MED ORDER — PREDNISONE 10 MG PO TABS: ORAL_TABLET | ORAL | Status: DC

## 2014-10-08 NOTE — Telephone Encounter (Signed)
Ok to refill as requested with direction to take 8 day taper from 40 mg daily, "as instructed"  We are having the same problem finding primary doctors. Suggest she check with her insurance to see what local physicians are participating in her health plan. Then call those offices to see if someone can take her. She may need to be willing to get an appointment several months away.

## 2014-10-08 NOTE — Telephone Encounter (Signed)
Pt calling requesting refill of Predisone 10mg  taper #200 Pt wanting this refilled to keep on hand.   Pt states that she is having an issue finding a new PCP-- states that all the places she has called are not taking new patients. Pt wanting to know if there is a recommended Dr for her to try and establish with?  Please Dr Annamaria Boots. Thanks.

## 2014-10-08 NOTE — Telephone Encounter (Signed)
I spoke with the pt and notified him of recs per CDY  Pt verbalized understanding  Rx was sent to pharm  Nothing further needed

## 2014-11-22 ENCOUNTER — Other Ambulatory Visit: Payer: Self-pay | Admitting: Internal Medicine

## 2014-12-02 ENCOUNTER — Telehealth: Payer: Self-pay | Admitting: Hematology & Oncology

## 2014-12-02 NOTE — Telephone Encounter (Signed)
Pt left message to schedule appointment. I left her message to call

## 2014-12-02 NOTE — Telephone Encounter (Signed)
Pt called to schedule appointment with Dr. Marin Olp said saw him in 08 or 09. I can't find a record of it. She said she is having pain in extremities. She is leaving message with RN to check with MD. She doesn't have a PCP I told we could give her numbers to some if Dr. Marin Olp wants her to see PCP for referral.

## 2014-12-03 ENCOUNTER — Other Ambulatory Visit: Payer: Self-pay | Admitting: Nurse Practitioner

## 2014-12-03 DIAGNOSIS — Z8672 Personal history of thrombophlebitis: Secondary | ICD-10-CM

## 2014-12-04 ENCOUNTER — Telehealth: Payer: Self-pay | Admitting: Hematology & Oncology

## 2014-12-04 NOTE — Telephone Encounter (Signed)
Pt aware of 12-17 appointment

## 2014-12-05 ENCOUNTER — Other Ambulatory Visit (HOSPITAL_BASED_OUTPATIENT_CLINIC_OR_DEPARTMENT_OTHER): Payer: BC Managed Care – PPO | Admitting: Lab

## 2014-12-05 ENCOUNTER — Telehealth: Payer: Self-pay | Admitting: Hematology & Oncology

## 2014-12-05 ENCOUNTER — Ambulatory Visit (HOSPITAL_BASED_OUTPATIENT_CLINIC_OR_DEPARTMENT_OTHER): Payer: BC Managed Care – PPO | Admitting: Family

## 2014-12-05 ENCOUNTER — Encounter: Payer: Self-pay | Admitting: Family

## 2014-12-05 DIAGNOSIS — M79605 Pain in left leg: Secondary | ICD-10-CM

## 2014-12-05 DIAGNOSIS — M79604 Pain in right leg: Secondary | ICD-10-CM

## 2014-12-05 DIAGNOSIS — Z86718 Personal history of other venous thrombosis and embolism: Secondary | ICD-10-CM

## 2014-12-05 DIAGNOSIS — Z8672 Personal history of thrombophlebitis: Secondary | ICD-10-CM

## 2014-12-05 LAB — CBC WITH DIFFERENTIAL (CANCER CENTER ONLY)
BASO#: 0 10*3/uL (ref 0.0–0.2)
BASO%: 0.3 % (ref 0.0–2.0)
EOS ABS: 0.3 10*3/uL (ref 0.0–0.5)
EOS%: 2.7 % (ref 0.0–7.0)
HCT: 39 % (ref 34.8–46.6)
HGB: 12.6 g/dL (ref 11.6–15.9)
LYMPH#: 1.6 10*3/uL (ref 0.9–3.3)
LYMPH%: 15.5 % (ref 14.0–48.0)
MCH: 26.3 pg (ref 26.0–34.0)
MCHC: 32.3 g/dL (ref 32.0–36.0)
MCV: 81 fL (ref 81–101)
MONO#: 0.6 10*3/uL (ref 0.1–0.9)
MONO%: 5.3 % (ref 0.0–13.0)
NEUT%: 76.2 % (ref 39.6–80.0)
NEUTROS ABS: 8 10*3/uL — AB (ref 1.5–6.5)
PLATELETS: 344 10*3/uL (ref 145–400)
RBC: 4.8 10*6/uL (ref 3.70–5.32)
RDW: 13.7 % (ref 11.1–15.7)
WBC: 10.5 10*3/uL — AB (ref 3.9–10.0)

## 2014-12-05 NOTE — Telephone Encounter (Signed)
Pt is going to radiology to schedule [place], she said she couldn't do it today at 2:30 pm

## 2014-12-05 NOTE — Progress Notes (Signed)
[facility]  [phone] [phone]  ID: [name] OB: 01/05/1966 MR#: [id number] Patient Care Team: No Pcp Per Patient as PCP - General (General Practice)  DIAGNOSIS: History of left leg DVT Protein S deficiency   INTERVAL HISTORY: Ms. [name] is a very pleasant 48 yo female with history of DVT in her left leg. She was seen by Dr. Marin Olp in 2011 and treated with Coumadin for 6 months. She has had no recurrent DVT. She is allergic to aspirin.  She has asthma and is on Prednisone on and off. She states that she feels puffy and has occasional pain in her legs. She is worried she has another blood clot.  She has no swelling, tenderness, numbness or tingling in her extremities. She is not having pain in her legs at this time. She would like an ultrasound of her legs to make sure there are no DVTs.  She denies fever, chills, n/v, cough, rash, headache, blurred vision, dizziness, SOB, chest pain, palpitations, abdominal pain, constipation, diarrhea, blood in urine or stool.  Her appetite is good with the steroids and she is drinking plenty of fluids.   CURRENT TREATMENT: Observation  REVIEW OF SYSTEMS: All other 10 point review of systems is negative.   PAST MEDICAL HISTORY: Past Medical History  Diagnosis Date  . Asthma   . Allergic rhinitis, cause unspecified   . Unspecified essential hypertension   . Liver disease     PAST SURGICAL HISTORY: No past surgical history on file.  FAMILY HISTORY No family history on file.  GYNECOLOGIC HISTORY:  No LMP recorded.   SOCIAL HISTORY: History   Social History  . Marital Status: Married    Spouse Name: N/A    Number of Children: N/A  . Years of Education: N/A   Occupational History  . Manager [employer]    Social History Main Topics  . Smoking status: Never Smoker   . Smokeless tobacco: Never Used     Comment: Never Used Tobacco  . Alcohol Use: Not on file  . Drug Use: Not on  file  . Sexual Activity: Not on file   Other Topics Concern  . Not on file   Social History Narrative    ADVANCED DIRECTIVES:  <no information>  HEALTH MAINTENANCE: History  Substance Use Topics  . Smoking status: Never Smoker   . Smokeless tobacco: Never Used     Comment: Never Used Tobacco  . Alcohol Use: Not on file   Colonoscopy: PAP: Bone density: Lipid panel:  Allergies  Allergen Reactions  . Aspirin   . Penicillins     Current Outpatient Prescriptions  Medication Sig Dispense Refill  . albuterol (PROAIR HFA) 108 (90 BASE) MCG/ACT inhaler Inhale 1-2 puffs into the lungs every 6 (six) hours as needed for wheezing or shortness of breath. 1 Inhaler 0  . albuterol (PROVENTIL) (2.5 MG/3ML) 0.083% nebulizer solution Use 1 vial in nebulizer as needed dx:496 150 mL PRN  . albuterol (PROVENTIL) 2 MG tablet TAKE ONE TABLET BY MOUTH TWICE DAILY 60 tablet 0  . Fluticasone Furoate-Vilanterol (BREO ELLIPTA) 100-25 MCG/INH AEPB Inhale 1 puff into the lungs daily. Rinse mouth after use 1 each prn  . predniSONE (DELTASONE) 10 MG tablet 4 X 2 DAYS, 3 X 2 DAYS, 2 X 2 DAYS, 1 X 2 DAYS, then as directed 200 tablet 0  . Prenatal Vit-Fe Sulfate-FA (PRENATAL VITAMIN PO) Take 1 capsule by mouth daily.    [name] triamcinolone (NASACORT) 55  MCG/ACT nasal inhaler 2 sprays by Nasal route daily.     [name] ipratropium (ATROVENT) 0.02 % nebulizer solution Use 1 vial in nebulizer as needed dx. 496 (Patient not taking: Reported on 12/05/2014) 150 mL PRN  . potassium chloride (KLOR-CON) 8 MEQ tablet Take 1 tablet (8 mEq total) by mouth 1 day or 1 dose. With demadex (Patient not taking: Reported on 12/05/2014) 30 tablet 5  . torsemide (DEMADEX) 10 MG tablet 1 daily as needed- diuretic (Patient not taking: Reported on 12/05/2014) 30 tablet 5   No current facility-administered medications for this visit.    OBJECTIVE: Filed Vitals:   12/05/14 0932  BP: 151/82  Pulse: 71  Temp: 97.8 F (36.6 C)  Resp: 14     Filed Weights   12/05/14 0932  Weight: 202 lb (91.627 kg)   ECOG FS:0 - Asymptomatic Ocular: Sclerae unicteric, pupils equal, round and reactive to light Ear-nose-throat: Oropharynx clear, dentition fair Lymphatic: No cervical or supraclavicular adenopathy Lungs no rales or rhonchi, good excursion bilaterally Heart regular rate and rhythm, no murmur appreciated Abd soft, nontender, positive bowel sounds MSK no focal spinal tenderness, no joint edema Neuro: non-focal, well-oriented, appropriate affect Breasts: Deferred  LAB RESULTS: CMP     Component Value Date/Time   NA 143 07/20/2011 1729   K 3.7 07/20/2011 1729   CL 110 07/20/2011 1729   CO2 30 07/20/2011 1729   GLUCOSE 79 07/20/2011 1729   BUN 13 07/20/2011 1729   CREATININE 0.9 07/20/2011 1729   CALCIUM 9.2 07/20/2011 1729   PROT 6.8 07/20/2011 1729   ALBUMIN 3.8 07/20/2011 1729   AST 22 07/20/2011 1729   ALT 24 07/20/2011 1729   ALKPHOS 80 07/20/2011 1729   BILITOT 0.6 07/20/2011 1729   INo results found for: SPEP, UPEP Lab Results  Component Value Date   WBC 10.5* 12/05/2014   NEUTROABS 8.0* 12/05/2014   HGB 12.6 12/05/2014   HCT 39.0 12/05/2014   MCV 81 12/05/2014   PLT 344 12/05/2014   No results found for: LABCA2 No components found for: APOLI103 No results for input(s): INR in the last 168 hours. Urinalysis No results found for: COLORURINE, APPEARANCEUR, LABSPEC, PHURINE, GLUCOSEU, HGBUR, BILIRUBINUR, KETONESUR, PROTEINUR, UROBILINOGEN, NITRITE, LEUKOCYTESUR STUDIES: No results found.  ASSESSMENT/PLAN: Ms. [name] is a very pleasant 48 yo [place] American female American female with history of DVT in her left leg. She has been off Coumadin for 6 years. She has had no recurrent DVTs. She is not currently in any pain and there is no swelling in her legs.  She recently had some intermittent bilateral leg pain and is afraid she has DVTs again since she is no longer in a blood thinner. She would like to have an ultrasound  of her legs. We will schedule her for a bilateral ultrasound of the legs this week.  She needs a PCP and plans to make an appointment with [name].    Eliezer Bottom, NP 12/05/2014 10:09 AM

## 2014-12-09 ENCOUNTER — Ambulatory Visit (HOSPITAL_BASED_OUTPATIENT_CLINIC_OR_DEPARTMENT_OTHER)
Admission: RE | Admit: 2014-12-09 | Discharge: 2014-12-09 | Disposition: A | Discharge status: Home / Self Care | Payer: BC Managed Care – PPO | Source: Ambulatory Visit | Attending: Family | Admitting: Family

## 2014-12-09 DIAGNOSIS — M79652 Pain in left thigh: Secondary | ICD-10-CM | POA: Diagnosis present

## 2014-12-09 DIAGNOSIS — M79651 Pain in right thigh: Secondary | ICD-10-CM | POA: Insufficient documentation

## 2014-12-09 DIAGNOSIS — Z8672 Personal history of thrombophlebitis: Secondary | ICD-10-CM

## 2014-12-10 ENCOUNTER — Telehealth: Payer: Self-pay | Admitting: *Deleted

## 2014-12-10 NOTE — Telephone Encounter (Addendum)
Patient aware of results.  ----- Message from Volanda Napoleon, MD sent at 12/09/2014  2:29 PM EST ----- Please call and tell her there is no blood clot thank you

## 2014-12-26 ENCOUNTER — Encounter: Payer: Self-pay | Admitting: Internal Medicine

## 2014-12-26 ENCOUNTER — Ambulatory Visit (INDEPENDENT_AMBULATORY_CARE_PROVIDER_SITE_OTHER): Payer: BLUE CROSS/BLUE SHIELD | Admitting: Internal Medicine

## 2014-12-26 ENCOUNTER — Ambulatory Visit (INDEPENDENT_AMBULATORY_CARE_PROVIDER_SITE_OTHER)
Admission: RE | Admit: 2014-12-26 | Discharge: 2014-12-26 | Disposition: A | Discharge status: Home / Self Care | Payer: BLUE CROSS/BLUE SHIELD | Source: Ambulatory Visit | Attending: Internal Medicine | Admitting: Internal Medicine

## 2014-12-26 VITALS — BP 132/82 | HR 85 | Ht 67.0 in | Wt 202.4 lb

## 2014-12-26 DIAGNOSIS — J4541 Moderate persistent asthma with (acute) exacerbation: Secondary | ICD-10-CM

## 2014-12-26 DIAGNOSIS — Z92241 Personal history of systemic steroid therapy: Secondary | ICD-10-CM

## 2014-12-26 MED ORDER — ALBUTEROL SULFATE 2 MG PO TABS: 2.0000 mg | ORAL_TABLET | Freq: Two times a day (BID) | ORAL | Status: DC

## 2014-12-26 MED ORDER — PREDNISONE 10 MG PO TABS: ORAL_TABLET | ORAL | Status: DC

## 2014-12-26 MED ORDER — LEVALBUTEROL HCL 0.63 MG/3ML IN NEBU
0.6300 mg | INHALATION_SOLUTION | Freq: Once | RESPIRATORY_TRACT | Status: AC
  Administered 2014-12-26: 0.63 mg via RESPIRATORY_TRACT

## 2014-12-26 MED ORDER — ALBUTEROL SULFATE 108 (90 BASE) MCG/ACT IN AEPB: 2.0000 | INHALATION_SPRAY | RESPIRATORY_TRACT | Status: DC | PRN

## 2014-12-26 MED ORDER — METHYLPREDNISOLONE ACETATE 80 MG/ML IJ SUSP
80.0000 mg | Freq: Once | INTRAMUSCULAR | Status: AC
  Administered 2014-12-26: 80 mg via INTRAMUSCULAR

## 2014-12-26 NOTE — Progress Notes (Signed)
Patient ID: [name], female    DOB: January 02, 1966, 49 y.o.   MRN: [id number]  Asthma Her past medical history is significant for asthma.   04/02/11-44 yo F followed for severe asthma requiring frequent steroids in past, chronic fixed obstruction, hx nasal polyps, allergic rhinitis, hx DVT.[name] Now first pregnancy with concern for complications, started on heparin.  Asthma control not good- blames weather, pollen. Has  Been on daily prednisone and started herself on a burst yesterday from 40 mg daily. Denies fever or infection. Continues Xolair, Dulera, and usually just 10 mg prednisone daily. We discussed asthma meds and pregnancy, looked up Xolair in [facility] for review with her, and asked her to discus with her high risk pregnancy physicians.   07/20/11- 70 yo F followed for severe asthma requiring frequent steroids in past, Failed Xolair, chronic fixed obstruction, hx nasal polyps, allergic rhinitis, hx DVT. Acute-Has not seen difference with Xolair now after 6 months. Has not been able to stayoff prednisone, baseline 10 mg/ day, but has put herself on a burst and taper a couple of times. Discussed steroid side effects again.  Up and down, varies with weather. Heat is hard. Feet swell a little. Last two days she has been more tight in chest. Trying again to get pregnant.  No recurrence of nasal polyps- still sees Dr Erik Obey.  09/03/04-  8 yo F followed for severe asthma (requiring frequent steroids , Failed Xolair, chronic fixed obstruction), hx nasal polyps, allergic rhinitis, hx DVT. She had flu and pneumonia vaccines 2 weeks ago. Feels that she is doing "okay" meaning she is getting by. We had stopped Xolair as unsuccessful. She stopped taking prednisone in the first week of August because she is just desperate to be away from it. Complains of generalized body aching. We discussed adrenal insufficiency and steroid withdrawal again. Noticing some watery rhinorrhea. [name] which she is  using only once daily. PFT- moderate obstructive airways disease with insignificant response to bronchodilator and mild restriction FEV1/FVC 0.54 Echo 08/04/2011-normal LV, EF 55-60% right atrium and pressures normal without pulmonary hypertension.  09/28/11- 89 yo F followed for severe asthma (requiring frequent steroids , Failed Xolair, chronic fixed obstruction), hx nasal polyps, allergic rhinitis, hx DVT. Increased wheeze during the night-used nebulizer 3 times a day before coming here. Not on prednisone. More edema in her legs, left greater than right. Left leg was involved with a DVT years ago. Denies chest pain, palpitation, bloody sputum. Thinks she may have an early cold because nasal discharge is yellow. Lasix 20 mg daily has not been helping her leg edema.  11/26/11- 33 yo F followed for severe asthma (requiring frequent steroids , Failed Xolair, chronic fixed obstruction), hx nasal polyps, allergic rhinitis, hx DVT. Has gotten flu vaccine and pneumonia vaccine at work. Says she feels pretty well for this visit. Has been taking 10 mg of prednisone after her last taper. She expects some wheeze and cough most days. Denies purulent sputum reflux or sinus drainage. Controls ankle edema with Demadex 10 mg taken on most days.  06/12/12- 37 yo F followed for severe asthma (requiring frequent steroids , Failed Xolair, chronic fixed obstruction), hx nasal polyps, allergic rhinitis, hx DVT. Had to recently take prednisone for flare up and now having edema in ankles again-would like demadex to help with this-also K+ as well. The past 7-10 days she has had increased wheeze, chest tightness-not a cold. She asks we rewrite prednisone to specify taper directions so that she can  get it cheaper.  10/18/12- 23 yo F followed for severe asthma (requiring frequent steroids , Failed Xolair, chronic fixed obstruction), hx nasal polyps, allergic rhinitis, hx DVT. Patient states that she has recently had a flare  up--treated with Prednisone taper, now pred 10mg  daily. Pt has some questions in regards to her medical file--she was denied additional insurance d/t something in her file.  PFT- 09/09/11- FEV1 1.64/ 54%, FEV1/FVC 0.54, FEF25-75% 0.74/ 22%, insignif response to bronchodilator TLC 0.74, DLCO 72%. Moderate obstructive disease, mild restriction, mild reduction of DLCO. Office spirometry- mild obstructive airways disease. FEV1 1.95/73%, FVC 3.01/93%, FEV1/FVC 0.65, FEF 25-75% 1.26/38%. Weather change can trigger a flare. Needed prednisone burst but now back to maintenance 10 mg daily. Has not been off of prednisone in a long time, gets tight when she tries. Wheeze today is typical but she says the effort to blow into this from her head left or wheezier than on arrival. She is not using her Dulera maintenance inhaler and we discussed this. Uses nebulizer occasionally, albuterol tablet twice daily which we discussed, rescue inhaler twice daily. I emphasized the role of maintenance anti-inflammatory control medicines.  09/26/13- 34 yo F followed for severe asthma (requiring frequent steroids , Failed Xolair, chronic fixed obstruction), hx nasal polyps, allergic rhinitis, hx DVT FOLLOWS FOR: cough-productive-yellow and clear at times; wheezing, and slight SOB as well. Returned from [place] [facility] with increased cough, some wheeze and clear phlegm but no fever or sore throat Out of prednisone and feels she needs taper. I talked with her about cumulative prednisone side effects again.   12/05/13- 79 yo F followed for severe asthma (requiring frequent steroids , Failed Xolair, chronic fixed obstruction), hx nasal polyps, allergic rhinitis, hx DVT FOLLOWS FOR: Breathing has gotten worse since last OV. Reports severe wheezing, SOB, chest tightness. Onset was 3 days ago. Acute illness with increased cough, wheeze, white or yellow sputum but denies fever, sore throat and does not feel "sick". Currently tapering  prednisone.  12/26/14- 70 yo F followed for severe asthma (requiring frequent steroids , Failed Xolair, chronic fixed obstruction), hx nasal polyps, allergic rhinitis, hx DVT FOLLOWS FOR: Pt c/o wheezing x 1 week (with weather change). Denies Cough and SOB.  Stable for longer periods now but has had a recent exacerbation with a cold. Still using albuterol tablets 2 mg twice daily. Last prednisone taper was over a month ago. Uses nebulizer about twice a week and maintenance prednisone 10 mg daily.  Review of Systems-See HPI Constitutional:   No weight loss, night sweats,  Fevers, chills, fatigue, lassitude. HEENT:   No headaches,  Difficulty swallowing,  Tooth/dental problems,  Sore throat,                No sneezing, itching, ear ache,, post nasal drip. Some nasal congestion. CV:  No chest pain,  Orthopnea, PND, swelling in lower extremities, anasarca, dizziness, palpitations GI  No heartburn, indigestion, abdominal pain, nausea, vomiting, Resp:Usually some shortness of breath with exertion, not at rest.  No excess mucus, no- productive cough,          + non-productive cough,  No coughing up of blood.  + change in color of mucus.  + wheezing.   Skin: no rash or lesions. GU: . MS:  No joint pain, + swelling.   Neuro- nothing unusual Psych:  No change in mood or affect. No depression or anxiety.  No memory loss. Objective:   Physical Exam  General- Alert, Oriented, Affect-appropriate, Distress- none acute  Relaxed and conversational   overweight Skin- rash-none, lesions- none, excoriation- none Lymphadenopathy- none Head- atraumatic            Eyes- Gross vision intact, PERRLA, conjunctivae clear secretions            Ears- Hearing, canals normal            Nose- Clear, No-Septal dev, mucus, , erosion, perforation.             Throat- Mallampati II-III, mucosa -trace thrush , drainage- none, tonsils- atrophic Neck- flexible , trachea midline, no stridor , thyroid nl, carotid no bruit Chest  - symmetrical excursion , unlabored           Heart/CV- RRR , no murmur , no gallop  , no rub, nl s1 s2                           - JVD- none , edema 1-2+, , stasis changes- none, varices- none           Lung- + bilateral inspiratory and expiratory wheeze,  unlabored, cough- none , dullness-none, rub- none           Chest wall-  Abd-  Br/ Gen/ Rectal- Not done, not indicated Extrem- cyanosis- none, clubbing, none, atrophy- none, strength- nl.  Neuro- grossly intact to observation

## 2014-12-26 NOTE — Patient Instructions (Addendum)
Neb xop 0.63  Depo 80  Scripts sent for prednisone and albuterol tablets to [facility]  Order- Dexa scan bone density    Dx chronic steroid therapy  Order- CXR  Dx Asthma moderate persistent

## 2014-12-27 ENCOUNTER — Telehealth: Payer: Self-pay | Admitting: Internal Medicine

## 2014-12-27 ENCOUNTER — Other Ambulatory Visit: Payer: Self-pay | Admitting: Internal Medicine

## 2014-12-27 MED ORDER — FLUTICASONE FUROATE-VILANTEROL 100-25 MCG/INH IN AEPB: 1.0000 | INHALATION_SPRAY | Freq: Every day | RESPIRATORY_TRACT | Status: DC

## 2014-12-27 NOTE — Telephone Encounter (Signed)
Called and spoke with pt and she stated that she was seen by CY on 12-26-14 and that she was started on breo and given a sample.  She stated that she has a coupon to get this free each month. She was requesting that this be sent to her pharmacy.  This has been done and pt is aware.  Nothing further is needed.

## 2015-01-01 ENCOUNTER — Ambulatory Visit (INDEPENDENT_AMBULATORY_CARE_PROVIDER_SITE_OTHER)
Admission: RE | Admit: 2015-01-01 | Discharge: 2015-01-01 | Disposition: A | Discharge status: Home / Self Care | Payer: BLUE CROSS/BLUE SHIELD | Source: Ambulatory Visit | Attending: Internal Medicine | Admitting: Internal Medicine

## 2015-01-01 ENCOUNTER — Telehealth: Payer: Self-pay | Admitting: Internal Medicine

## 2015-01-01 ENCOUNTER — Other Ambulatory Visit: Payer: BLUE CROSS/BLUE SHIELD

## 2015-01-01 DIAGNOSIS — Z92241 Personal history of systemic steroid therapy: Secondary | ICD-10-CM

## 2015-01-02 NOTE — Telephone Encounter (Signed)
Spoke with [name] as this is time stamped for 5:46 yesterday evening, [name] states that [name] showed pt how to use inhaler and had asked [name] just to put a message in triage about it.  Nothing further needed at this time.

## 2015-01-02 NOTE — Telephone Encounter (Signed)
Yes, this is correct Went to lobby, pt needed assistance with her Proair Respiclick to ensure correct usage Discussed with pt in length and she demonstrated Pt also requested cxr results from last ov - pt advised of normal cxr results  Will sign off

## 2015-01-13 ENCOUNTER — Encounter: Payer: Self-pay | Admitting: Family

## 2015-01-13 ENCOUNTER — Ambulatory Visit (INDEPENDENT_AMBULATORY_CARE_PROVIDER_SITE_OTHER): Payer: BLUE CROSS/BLUE SHIELD | Admitting: Family

## 2015-01-13 VITALS — BP 136/90 | HR 72 | Temp 98.2°F | Resp 16 | Ht 67.0 in | Wt 202.0 lb

## 2015-01-13 DIAGNOSIS — I10 Essential (primary) hypertension: Secondary | ICD-10-CM

## 2015-01-13 DIAGNOSIS — R7303 Prediabetes: Secondary | ICD-10-CM | POA: Insufficient documentation

## 2015-01-13 DIAGNOSIS — F429 Obsessive-compulsive disorder, unspecified: Secondary | ICD-10-CM

## 2015-01-13 DIAGNOSIS — F42 Obsessive-compulsive disorder: Secondary | ICD-10-CM

## 2015-01-13 DIAGNOSIS — Z8672 Personal history of thrombophlebitis: Secondary | ICD-10-CM

## 2015-01-13 LAB — BASIC METABOLIC PANEL
BUN: 12 mg/dL (ref 6–23)
CALCIUM: 9.4 mg/dL (ref 8.4–10.5)
CO2: 25 meq/L (ref 19–32)
Chloride: 103 mEq/L (ref 96–112)
Creatinine, Ser: 0.75 mg/dL (ref 0.40–1.20)
GFR: 106.44 mL/min (ref >60.00)
Glucose, Bld: 100 mg/dL — ABNORMAL HIGH (ref 70–99)
Potassium: 3.5 mEq/L (ref 3.5–5.1)
Sodium: 137 mEq/L (ref 135–145)

## 2015-01-13 LAB — HEMOGLOBIN A1C: Hgb A1c MFr Bld: 6.2 % (ref 4.6–6.5)

## 2015-01-13 MED ORDER — HYDROCHLOROTHIAZIDE 25 MG PO TABS: 25.0000 mg | ORAL_TABLET | Freq: Every day | ORAL | Status: DC

## 2015-01-13 NOTE — Assessment & Plan Note (Signed)
Well controlled on current regimen- management per Dr. Annamaria Boots.

## 2015-01-13 NOTE — Progress Notes (Signed)
Subjective:    Patient ID: [name], female    DOB: [birth year], 49 y.o.   MRN: [id number]  HPI  [name] is a 49 yr old female who presents today to establish care.   Chart notes hx of liver disease but Veronica denies this and review of liver testing in chart was normal.   HTN- [name] no hx of hypertension.   BP Readings from Last 3 Encounters:  01/13/15 136/90  12/26/14 132/82  12/05/14 151/82   Hx DVT- 2005, [name] that [name] was treated with coumadin.  [name] that [name] was treated with coumadin which was discontinued by Dr. Marin Olp. [name] that there is hx of clot on [name] dad's side, though [name] dad never had clot.   [name] [name] + swelling.  [name] [name] that [name] has been on prednisone on/off for 15 years.  [name] that [name] hands, face. [name] [name] that over the last 5 years [name] has gained about 40 pounds.  [name] had a recent bone density which was normal.   Notes that [name] double and triple checks things, not sure [name] wants med for this.    Review of Systems  Constitutional:       + weight gain due to steroids  HENT: Negative for hearing loss and rhinorrhea.   Eyes: Negative for visual disturbance.  Respiratory: Negative for cough and shortness of breath.   Cardiovascular: Negative for chest pain.  Gastrointestinal: Negative for nausea, vomiting and diarrhea.  Genitourinary: Negative for dysuria.       [name] that [name] gets up 2-3 times in the night to urinate  Musculoskeletal:       [name] occasional shin pain- walks at [employer]  Skin: Negative for rash.  Neurological: Negative for headaches.  Hematological: Negative for adenopathy.  Psychiatric/Behavioral:       Denies depression/anxiety [name] that [name] was on a med x 6 mos    Past Medical History  Diagnosis Date  . Asthma   . Allergic rhinitis, cause unspecified   . Unspecified essential hypertension   . History of DVT (deep vein thrombosis) 2005    [name] blood clot below knee ?unsure which leg.  .  Nasal polyps     History   Social History  . Marital Status: Married    Spouse Name: N/A    Number of Children: N/A  . Years of Education: N/A   Occupational History  . Manager [employer]    Social History Main Topics  . Smoking status: Never Smoker   . Smokeless tobacco: Never Used     Comment: Never Used Tobacco  . Alcohol Use: 0.0 oz/week    0 Not specified per week     Comment: 1 glass of wine per year  . Drug Use: Not on file  . Sexual Activity: Not on file   Other Topics Concern  . 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      Past Surgical History  Procedure Laterality Date  . Uterine fibroid surgery  2005  . Nasal sinus surgery  5361    Dr Erik Obey  . Lasik Bilateral 2008    Family History  Problem Relation Age of Onset  . Cancer Mother 82    ?carcinoid tumor (had surgical removal)  .  Diabetes Mother     borderline  . Heart disease Father     died 51, from MI, had hx of ETOH abuse and liver disease  . Kidney disease Neg Hx   . Hypertension Neg Hx   . Hyperlipidemia Neg Hx     Allergies  Allergen Reactions  . Aspirin   . Penicillins     Current Outpatient Prescriptions on File Prior to Visit  Medication Sig Dispense Refill  . albuterol (PROAIR HFA) 108 (90 BASE) MCG/ACT inhaler Inhale 1-2 puffs into the lungs every 6 (six) hours as needed for wheezing or shortness of breath. 1 Inhaler 0  . albuterol (PROVENTIL) (2.5 MG/3ML) 0.083% nebulizer solution Use 1 vial in nebulizer as needed dx:496 150 mL PRN  . albuterol (PROVENTIL) 2 MG tablet Take 1 tablet (2 mg total) by mouth 2 (two) times daily. 60 tablet 5  . Albuterol Sulfate (PROAIR RESPICLICK) 979 (90 BASE) MCG/ACT AEPB Inhale 2 puffs into the lungs every 4 (four) hours as needed (Shortness of breath, wheezing, congestion). 1 each 1  .  BREO ELLIPTA 100-25 MCG/INH AEPB INHALE ONE PUFF INTO LUNGS ONCE DAILY 60 each 0  . ipratropium (ATROVENT) 0.02 % nebulizer solution Use 1 vial in nebulizer as needed dx. 496 150 mL PRN  . predniSONE (DELTASONE) 10 MG tablet 1 daily or as directed 200 tablet 5  . Prenatal Vit-Fe Fumarate-FA (PRENATAL MULTIVITAMIN) TABS tablet Take 1 tablet by mouth daily at 12 noon.    . Prenatal Vit-Fe Sulfate-FA (PRENATAL VITAMIN PO) Take 1 capsule by mouth daily.    [name] triamcinolone (NASACORT) 55 MCG/ACT nasal inhaler 2 sprays by Nasal route daily.      No current facility-administered medications on file prior to visit.    BP 136/90 mmHg  Pulse 72  Temp(Src) 98.2 F (36.8 C) (Oral)  Resp 16  Ht 5\' 7"  (1.702 m)  Wt 202 lb (91.627 kg)  BMI 31.63 kg/m2  SpO2 99%  LMP 12/31/2014        Objective:   Physical Exam  Constitutional: [name] is oriented to person, place, and time. [name] appears well-developed and well-nourished. No distress.  HENT:  Head: Normocephalic and atraumatic.  Right Ear: Tympanic membrane and ear canal normal.  Left Ear: Tympanic membrane and ear canal normal.  Mouth/Throat: No oropharyngeal exudate, posterior oropharyngeal edema or posterior oropharyngeal erythema.  Eyes: No scleral icterus.  Cardiovascular: Normal rate and regular rhythm.   No murmur heard. Pulmonary/Chest: Effort normal and breath sounds normal. No respiratory distress. [name] has no wheezes. [name] has no rales. [name] exhibits no tenderness.  Musculoskeletal: [name] exhibits no edema.  Neurological: [name] is alert and oriented to person, place, and time.  Skin: Skin is warm and dry.  Psychiatric: [name] has a normal mood and affect. [name] behavior is normal. Judgment and thought content normal.          Assessment & Plan:

## 2015-01-13 NOTE — Progress Notes (Signed)
Pre visit review using our clinic review tool, if applicable. No additional management support is needed unless otherwise documented below in the visit note. 

## 2015-01-13 NOTE — Patient Instructions (Signed)
Please complete lab work prior to leaving. Start HCTZ for fluid and blood pressure. Schedule fasting physical in 2-3 weeks.  Welcome to [facility]!

## 2015-01-13 NOTE — Assessment & Plan Note (Signed)
Borderline HTN with dependent edema after she has been on her feet.  Will initiate hctz.  This should help with edema and BP.  Obtain baseline bmet today.

## 2015-01-14 DIAGNOSIS — F429 Obsessive-compulsive disorder, unspecified: Secondary | ICD-10-CM | POA: Insufficient documentation

## 2015-01-14 NOTE — Assessment & Plan Note (Signed)
Symptoms consistent with mild ocd. She declines med at this time, but will consider if symptoms worsen.

## 2015-01-14 NOTE — Assessment & Plan Note (Signed)
Completed 6 months of coumadin.

## 2015-01-20 ENCOUNTER — Other Ambulatory Visit: Payer: Self-pay | Admitting: Internal Medicine

## 2015-01-20 ENCOUNTER — Telehealth: Payer: Self-pay | Admitting: Internal Medicine

## 2015-01-20 MED ORDER — CLARITHROMYCIN 500 MG PO TABS: 500.0000 mg | ORAL_TABLET | Freq: Two times a day (BID) | ORAL | Status: DC

## 2015-01-20 NOTE — Telephone Encounter (Signed)
Offer biaxin 500 mg, # 14, 1 twice daily

## 2015-01-20 NOTE — Telephone Encounter (Signed)
Spoke with pt. States that she has congestion and cough x1 week. Cough is producing yellow mucus. SOB and chest tightness are also present. Would like a stronger antibiotic sent in.  Allergies  Allergen Reactions  . Aspirin   . Penicillins     CY - please advise. Thanks.

## 2015-01-20 NOTE — Telephone Encounter (Signed)
Pt is aware of CY's recs. Rx has been sent in. Nothing further was needed. 

## 2015-01-21 ENCOUNTER — Telehealth: Payer: Self-pay | Admitting: Internal Medicine

## 2015-01-21 NOTE — Telephone Encounter (Signed)
Called and spoke to pt. Pt c/o chest congestion, chest tightness and prod cough with yellow mucus. Advised pt to take abx that CY prescribed yesterday. Pt stated she was scared because of the severity of her s/s and she does not want the cold to turn into pneumonia. Advised pt to take abx like CY prescribed and it will help the infection. Pt verbalized understanding and denied any further questions at this time.

## 2015-02-04 LAB — HM PAP SMEAR: HM PAP: NORMAL

## 2015-02-08 NOTE — Assessment & Plan Note (Signed)
We discussed the use of albuterol tablets and maintenance prednisone. I hope we can get away from nose. Plan-refill present meds. Consider change Breo Ellipta to Stiolto inhaler if she doesn't need to be on a steroid inhaler.

## 2015-02-10 ENCOUNTER — Telehealth: Payer: Self-pay | Admitting: Family

## 2015-02-10 MED ORDER — HYDROCHLOROTHIAZIDE 25 MG PO TABS: 25.0000 mg | ORAL_TABLET | Freq: Every day | ORAL | Status: DC

## 2015-02-10 NOTE — Telephone Encounter (Signed)
Yes.  Needs to keep upcoming appointment though. Refill sent to her pharmacy.

## 2015-02-10 NOTE — Telephone Encounter (Signed)
Caller name:Tenlee Sides Relationship to patient:self Can be reached:[phone] Pharmacy:  Reason for call:Wants to know if she is still suppose to be taking, hydrochlorothiazide 25mg ?

## 2015-02-10 NOTE — Telephone Encounter (Signed)
Notified pt and she voices understanding. 

## 2015-02-17 ENCOUNTER — Encounter: Payer: Self-pay | Admitting: Family

## 2015-02-17 ENCOUNTER — Ambulatory Visit (INDEPENDENT_AMBULATORY_CARE_PROVIDER_SITE_OTHER): Payer: BLUE CROSS/BLUE SHIELD | Admitting: Family

## 2015-02-17 VITALS — BP 126/82 | HR 82 | Temp 98.2°F | Resp 18 | Ht 67.0 in | Wt 201.6 lb

## 2015-02-17 DIAGNOSIS — Z0000 Encounter for general adult medical examination without abnormal findings: Secondary | ICD-10-CM

## 2015-02-17 DIAGNOSIS — Z23 Encounter for immunization: Secondary | ICD-10-CM

## 2015-02-17 DIAGNOSIS — I10 Essential (primary) hypertension: Secondary | ICD-10-CM

## 2015-02-17 LAB — CBC WITH DIFFERENTIAL/PLATELET
BASOS ABS: 0 10*3/uL (ref 0.0–0.1)
Basophils Relative: 0.3 % (ref 0.0–3.0)
Eosinophils Absolute: 0.1 10*3/uL (ref 0.0–0.7)
Eosinophils Relative: 1.6 % (ref 0.0–5.0)
HCT: 38.8 % (ref 36.0–46.0)
Hemoglobin: 12.7 g/dL (ref 12.0–15.0)
LYMPHS ABS: 1.8 10*3/uL (ref 0.7–4.0)
Lymphocytes Relative: 19.3 % (ref 12.0–46.0)
MCHC: 32.7 g/dL (ref 30.0–36.0)
MCV: 79.1 fl (ref 78.0–100.0)
MONOS PCT: 7.2 % (ref 3.0–12.0)
Monocytes Absolute: 0.7 10*3/uL (ref 0.1–1.0)
NEUTROS ABS: 6.8 10*3/uL (ref 1.4–7.7)
NEUTROS PCT: 71.6 % (ref 43.0–77.0)
Platelets: 307 10*3/uL (ref 150.0–400.0)
RBC: 4.91 Mil/uL (ref 3.87–5.11)
RDW: 14.1 % (ref 11.5–15.5)
WBC: 9.4 10*3/uL (ref 4.0–10.5)

## 2015-02-17 LAB — HEPATIC FUNCTION PANEL
ALT: 20 U/L (ref 0–35)
AST: 19 U/L (ref 0–37)
Albumin: 3.5 g/dL (ref 3.5–5.2)
Alkaline Phosphatase: 76 U/L (ref 39–117)
Bilirubin, Direct: 0.1 mg/dL (ref 0.0–0.3)
Total Bilirubin: 0.4 mg/dL (ref 0.2–1.2)
Total Protein: 6.5 g/dL (ref 6.0–8.3)

## 2015-02-17 LAB — LIPID PANEL
CHOLESTEROL: 187 mg/dL (ref 0–200)
HDL: 58.1 mg/dL (ref >39.00)
LDL Cholesterol: 111 mg/dL — ABNORMAL HIGH (ref 0–99)
NonHDL: 128.9
Total CHOL/HDL Ratio: 3
Triglycerides: 88 mg/dL (ref 0.0–149.0)
VLDL: 17.6 mg/dL (ref 0.0–40.0)

## 2015-02-17 LAB — URINALYSIS, ROUTINE W REFLEX MICROSCOPIC
Bilirubin Urine: NEGATIVE
KETONES UR: NEGATIVE
Leukocytes, UA: NEGATIVE
Nitrite: NEGATIVE
SPECIFIC GRAVITY, URINE: 1.025 (ref 1.000–1.030)
Total Protein, Urine: 30 — AB
URINE GLUCOSE: NEGATIVE
Urobilinogen, UA: 0.2 (ref 0.0–1.0)
pH: 5.5 (ref 5.0–8.0)

## 2015-02-17 LAB — BASIC METABOLIC PANEL
BUN: 12 mg/dL (ref 6–23)
CO2: 31 mEq/L (ref 19–32)
CREATININE: 0.82 mg/dL (ref 0.40–1.20)
Calcium: 9.1 mg/dL (ref 8.4–10.5)
Chloride: 103 mEq/L (ref 96–112)
GFR: 95.99 mL/min (ref >60.00)
Glucose, Bld: 84 mg/dL (ref 70–99)
POTASSIUM: 3.3 meq/L — AB (ref 3.5–5.1)
Sodium: 137 mEq/L (ref 135–145)

## 2015-02-17 MED ORDER — FUROSEMIDE 20 MG PO TABS: 20.0000 mg | ORAL_TABLET | Freq: Every day | ORAL | Status: DC

## 2015-02-17 NOTE — Assessment & Plan Note (Signed)
Continue healthy diet, exercise, weight loss efforts.  Obtain routine lab work.

## 2015-02-17 NOTE — Patient Instructions (Addendum)
Stop hctz, start lasix (furosemide) once daily in the morning. Follow up in 2 weeks. Complete lab work prior to leaving.

## 2015-02-17 NOTE — Progress Notes (Signed)
Subjective:    Patient ID: [name], female    DOB: [birth year], 49 y.o.   MRN: [id number]  HPI  Patient presents today for complete physical.  Immunizations: due for tdap Diet: has cut back on bread and pasta Exercise: she walks frequently at work.  Pap Smear: 2 weeks ago- GYN Mammogram: 2 weeks ago- completed. (normal per pt)  Lab Results  Component Value Date   HGBA1C 6.2 01/13/2015    HTN- last visit hctz was added to her regimen. Notes that she continues to hold on to fluid in her face, hands, knees. Feels like hctz has helped her blood pressure.   Review of Systems  Constitutional: Negative for unexpected weight change.  HENT: Negative for rhinorrhea.   Respiratory: Negative for cough.   Cardiovascular: Negative for palpitations.  Gastrointestinal: Negative for diarrhea and constipation.  Genitourinary: Negative for dysuria, frequency and menstrual problem.  Musculoskeletal: Negative for myalgias and arthralgias.  Skin: Negative for rash.  Neurological: Negative for headaches.  Hematological: Negative for adenopathy.  Psychiatric/Behavioral:       Denies depression/anxiety   Past Medical History  Diagnosis Date  . Asthma   . Allergic rhinitis, cause unspecified   . Unspecified essential hypertension   . History of DVT (deep vein thrombosis) 2005    Pt reports blood clot below knee ?unsure which leg.  . Nasal polyps     History   Social History  . Marital Status: Married    Spouse Name: N/A  . Number of Children: N/A  . Years of Education: N/A   Occupational History  . Manager [employer]    Social History Main Topics  . Smoking status: Never Smoker   . Smokeless tobacco: Never Used     Comment: Never Used Tobacco  . Alcohol Use: 0.0 oz/week    0 Standard drinks or equivalent per week     Comment: 1 glass of wine per year  . Drug Use: Not on file  . Sexual Activity: Not on file   Other Topics Concern  . 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      Past Surgical History  Procedure Laterality Date  . Uterine fibroid surgery  2005  . Nasal sinus surgery  7353    Dr Erik Obey  . Lasik Bilateral 2008    Family History  Problem Relation Age of Onset  . Cancer Mother 18    ?carcinoid tumor (had surgical removal)  . Diabetes Mother     borderline  . Heart disease Father     died 33, from MI, had hx of ETOH abuse and liver disease  . Kidney disease Neg Hx   . Hypertension Neg Hx   . Hyperlipidemia Neg Hx     Allergies  Allergen Reactions  . Aspirin   . Penicillins     Current Outpatient Prescriptions on File Prior to Visit  Medication Sig Dispense Refill  . albuterol (PROAIR HFA) 108 (90 BASE) MCG/ACT inhaler Inhale 1-2 puffs into the lungs every 6 (six) hours as needed for wheezing or shortness of breath. 1 Inhaler 0  . albuterol (PROVENTIL) (2.5 MG/3ML) 0.083% nebulizer solution Use 1 vial in nebulizer as needed dx:496 150 mL PRN  . albuterol (PROVENTIL) 2 MG tablet Take 1  tablet (2 mg total) by mouth 2 (two) times daily. 60 tablet 5  . Albuterol Sulfate (PROAIR RESPICLICK) 287 (90 BASE) MCG/ACT AEPB Inhale 2 puffs into the lungs every 4 (four) hours as needed (Shortness of breath, wheezing, congestion). 1 each 1  . BREO ELLIPTA 100-25 MCG/INH AEPB INHALE ONE PUFF BY MOUTH ONCE DAILY 60 each 0  . ipratropium (ATROVENT) 0.02 % nebulizer solution Use 1 vial in nebulizer as needed dx. 496 150 mL PRN  . predniSONE (DELTASONE) 10 MG tablet 1 daily or as directed 200 tablet 5  . Prenatal Vit-Fe Fumarate-FA (PRENATAL MULTIVITAMIN) TABS tablet Take 1 tablet by mouth daily at 12 noon.    . Prenatal Vit-Fe Sulfate-FA (PRENATAL VITAMIN PO) Take 1 capsule by mouth daily.    [name] triamcinolone (NASACORT) 55 MCG/ACT nasal inhaler 2 sprays by Nasal route daily.       No current facility-administered medications on file prior to visit.    Pulse 82  Temp(Src) 98.2 F (36.8 C) (Oral)  Resp 18  Ht 5\' 7"  (1.702 m)  Wt 201 lb 9.6 oz (91.445 kg)  BMI 31.57 kg/m2  SpO2 99%  LMP 02/17/2015        Objective:   Physical Exam  Physical Exam  Constitutional: She is oriented to person, place, and time. She appears well-developed and well-nourished. No distress.  HENT:  Head: Normocephalic and atraumatic.  Right Ear: Tympanic membrane and ear canal normal.  Left Ear: Tympanic membrane and ear canal normal.  Mouth/Throat: Oropharynx is clear and moist.  Eyes: Pupils are equal, round, and reactive to light. No scleral icterus.  Neck: Normal range of motion. No thyromegaly present.  Cardiovascular: Normal rate and regular rhythm.   No murmur heard. Pulmonary/Chest: Effort normal and breath sounds normal. No respiratory distress. He has no wheezes. She has no rales. She exhibits no tenderness.  Abdominal: Soft. Bowel sounds are normal. He exhibits no distension and no mass. There is no tenderness. There is no rebound and no guarding.  Musculoskeletal: She exhibits trace lower extremity edema Lymphadenopathy:    She has no cervical adenopathy.  Neurological: She is alert and oriented to person, place, and time. She has normal patellar reflexes. She exhibits normal muscle tone. Coordination normal.  Skin: Skin is warm and dry.  Psychiatric: She has a normal mood and affect. Her behavior is normal. Judgment and thought content normal.  Breast/pelvic: deferred to GYN       Assessment & Plan:         Assessment & Plan:

## 2015-02-17 NOTE — Progress Notes (Signed)
Pre visit review using our clinic review tool, if applicable. No additional management support is needed unless otherwise documented below in the visit note. 

## 2015-02-17 NOTE — Assessment & Plan Note (Signed)
BP looks better.  She does not feel that hctz is helping her edema though- she attributes the edema to chronic prednisone use.  Will d/c hctz, start lasix. Follow up in 2 weeks for BP recheck and bp recheck and repeat bmet.

## 2015-02-18 ENCOUNTER — Telehealth: Payer: Self-pay | Admitting: Family

## 2015-02-18 DIAGNOSIS — R9431 Abnormal electrocardiogram [ECG] [EKG]: Secondary | ICD-10-CM

## 2015-02-18 LAB — HIV ANTIBODY (ROUTINE TESTING W REFLEX): HIV: NONREACTIVE

## 2015-02-18 LAB — TSH: TSH: 1.05 u[IU]/mL (ref 0.35–4.50)

## 2015-02-18 NOTE — Telephone Encounter (Signed)
Please contact pt and let her know that I reviewed her EKG with another provider and think it would be a good idea to have her meet with cardiology to see if they have any other recommendations re: electrical change on EKG that we discussed at her visit.

## 2015-02-18 NOTE — Telephone Encounter (Signed)
Left message for pt to return my call.

## 2015-02-18 NOTE — Telephone Encounter (Signed)
Pt called returning call. Pt states will call back tomorrow. Thanks

## 2015-02-19 MED ORDER — POTASSIUM CHLORIDE CRYS ER 20 MEQ PO TBCR: 20.0000 meq | EXTENDED_RELEASE_TABLET | Freq: Every day | ORAL | Status: DC

## 2015-02-19 NOTE — Telephone Encounter (Signed)
Blood count normal, cholesterol good, HIV negative, thyroid, liver, kidney function, sugar normal.  + blood in urine (did she have period that day?) potassium mildly low.  Add kdur once daily. Needs follow up appointment and blood draw in 2 weeks.

## 2015-02-19 NOTE — Telephone Encounter (Signed)
Notified pt and she voices understanding. Pt states she will have to check her work schedule and call us back for 2 week f/u and lab.

## 2015-02-19 NOTE — Telephone Encounter (Signed)
Notified pt and she states she would be agreeable to cardiology referral. Pt is requesting lab results as well.  Please advise.

## 2015-03-09 ENCOUNTER — Other Ambulatory Visit: Payer: Self-pay | Admitting: Internal Medicine

## 2015-03-19 ENCOUNTER — Encounter: Payer: Self-pay | Admitting: Cardiology

## 2015-03-19 ENCOUNTER — Ambulatory Visit (INDEPENDENT_AMBULATORY_CARE_PROVIDER_SITE_OTHER): Payer: BLUE CROSS/BLUE SHIELD | Admitting: Cardiology

## 2015-03-19 VITALS — BP 120/78 | HR 72 | Ht 67.0 in | Wt 205.0 lb

## 2015-03-19 DIAGNOSIS — I10 Essential (primary) hypertension: Secondary | ICD-10-CM

## 2015-03-19 DIAGNOSIS — R9431 Abnormal electrocardiogram [ECG] [EKG]: Secondary | ICD-10-CM | POA: Diagnosis not present

## 2015-03-19 NOTE — Progress Notes (Signed)
Cardiology Office Note   Date:  03/19/2015   ID:  [name], DOB October 08, 1966, MRN [id number]  PCP:  Nance Pear., NP    No chief complaint on file.     History of Present Illness: [name] is a 49 y.o. female who presents for abnormal EKG.  She was seen by her PCP recently for PE and EKG done was read out as NSR with short PR syndrome.  Her PR interval was calculated as 172msec. She denies any exertional chest pain, SOB, DOE, dizziness, palpitations or syncope. She occasionally has some tightness in her chest that she says she gets with her asthma.  There is no family history of heart arrhythmia or sudden cardiac death.      Past Medical History  Diagnosis Date  . Asthma   . Allergic rhinitis, cause unspecified   . Unspecified essential hypertension   . History of DVT (deep vein thrombosis) 2005    Pt reports blood clot below knee ?unsure which leg.  . Nasal polyps     Past Surgical History  Procedure Laterality Date  . Uterine fibroid surgery  2005  . Nasal sinus surgery  6283    Dr Erik Obey  . Lasik Bilateral 2008     Current Outpatient Prescriptions  Medication Sig Dispense Refill  . albuterol (PROVENTIL) (2.5 MG/3ML) 0.083% nebulizer solution Use 1 vial in nebulizer as needed dx:496 150 mL PRN  . albuterol (PROVENTIL) 2 MG tablet Take 1 tablet (2 mg total) by mouth 2 (two) times daily. 60 tablet 5  . Albuterol Sulfate (PROAIR RESPICLICK) 662 (90 BASE) MCG/ACT AEPB Inhale 2 puffs into the lungs every 4 (four) hours as needed (Shortness of breath, wheezing, congestion). 1 each 1  . BREO ELLIPTA 100-25 MCG/INH AEPB INHALE ONE PUFF BY MOUTH ONCE DAILY 60 each 0  . furosemide (LASIX) 20 MG tablet Take 1 tablet (20 mg total) by mouth daily. 30 tablet 0  . ipratropium (ATROVENT) 0.02 % nebulizer solution Use 1 vial in nebulizer as needed dx. 496 150 mL PRN  . potassium chloride SA (K-DUR,KLOR-CON) 20 MEQ tablet Take 1 tablet (20 mEq total) by  mouth daily. 30 tablet 3  . predniSONE (DELTASONE) 10 MG tablet 1 daily or as directed 200 tablet 5  . Prenatal Vit-Fe Sulfate-FA (PRENATAL VITAMIN PO) Take 1 capsule by mouth daily.    [name] triamcinolone (NASACORT) 55 MCG/ACT nasal inhaler 2 sprays by Nasal route daily.      No current facility-administered medications for this visit.    Allergies:   Aspirin and Penicillins    Social History:  The patient  reports that she has never smoked. She has never used smokeless tobacco. She reports that she drinks alcohol.   Family History:  The patient's family history includes Cancer (age of onset: 53) in her mother; Diabetes in her mother; Heart disease in her father. There is no history of Kidney disease, Hypertension, or Hyperlipidemia.    ROS:  Please see the history of present illness.   Otherwise, review of systems are positive for none.   All other systems are reviewed and negative.    PHYSICAL EXAM: VS:  BP 120/78 mmHg  Pulse 72  Ht 5\' 7"  (1.702 m)  Wt 205 lb (92.987 kg)  BMI 32.10 kg/m2  SpO2 97%  LMP 02/17/2015 , BMI Body mass index is 32.1 kg/(m^2). GEN: Well nourished, well developed, in no acute distress HEENT: normal Neck: no JVD, carotid bruits, or masses  Cardiac: RRR; no murmurs, rubs, or gallops,no edema  Respiratory:  clear to auscultation bilaterally, normal work of breathing GI: soft, nontender, nondistended, + BS MS: no deformity or atrophy Skin: warm and dry, no rash Neuro:  Strength and sensation are intact Psych: euthymic mood, full affect   EKG:  EKG is not ordered today.    Recent Labs: 02/17/2015: ALT 20; BUN 12; Creatinine 0.82; Hemoglobin 12.7; Platelets 307.0; Potassium 3.3*; Sodium 137; TSH 1.05    Lipid Panel    Component Value Date/Time   CHOL 187 02/17/2015 1241   TRIG 88.0 02/17/2015 1241   HDL 58.10 02/17/2015 1241   CHOLHDL 3 02/17/2015 1241   VLDL 17.6 02/17/2015 1241   LDLCALC 111* 02/17/2015 1241      Wt Readings from Last 3  Encounters:  03/19/15 205 lb (92.987 kg)  02/17/15 201 lb 9.6 oz (91.445 kg)  01/13/15 202 lb (91.627 kg)       ASSESSMENT AND PLAN:  1.  Abnormal EKG - read out as short PR at 166msec but by my measurement the PR interval is 166msec.  She has never had any palpitations or dizziness and no family history of sudden cardiac death or arrhythmia.  No further workup needed at this time.   Current medicines are reviewed at length with the patient today.  The patient does not have concerns regarding medicines.  The following changes have been made:  no change  Labs/ tests ordered today include: see above assessment and plan No orders of the defined types were placed in this encounter.     Disposition:   FU with me PRN   Signed, Sueanne Margarita, MD  03/19/2015 11:15 AM    [address], [place], [place]  [address] Phone: [phone]; Fax: [phone]

## 2015-03-19 NOTE — Patient Instructions (Signed)
Your physician recommends that you continue on your current medications as directed. Please refer to the Current Medication list given to you today.  Your physician recommends that you schedule a follow-up appointment AS NEEDED with Dr. Radford Pax.

## 2015-03-25 ENCOUNTER — Telehealth: Payer: Self-pay | Admitting: Family

## 2015-03-25 ENCOUNTER — Ambulatory Visit: Payer: BLUE CROSS/BLUE SHIELD | Admitting: Family

## 2015-03-25 NOTE — Telephone Encounter (Signed)
Caller name: [name]  Relation to pt: Self  Call back number: [phone] Pharmacy: [facility]  on [address].  Reason for call: Pt came in office requesting for meds furosemide (LASIX) 20 MG tablet and for potasium. Pt is asking to please call her for additional information for meds.

## 2015-03-25 NOTE — Telephone Encounter (Signed)
Pt is returning your call best [phone]

## 2015-03-25 NOTE — Telephone Encounter (Signed)
Pt did not r/s appt that she missed today (arrived 17 minutes late). Attempted to reach pt and verify Rx requests and additional concerns and unable to leave message as voicemail is full.

## 2015-03-26 MED ORDER — FUROSEMIDE 20 MG PO TABS: 20.0000 mg | ORAL_TABLET | Freq: Every day | ORAL | Status: DC

## 2015-03-26 MED ORDER — POTASSIUM CHLORIDE 20 MEQ/15ML (10%) PO SOLN: 20.0000 meq | Freq: Every day | ORAL | Status: DC

## 2015-03-26 NOTE — Telephone Encounter (Signed)
Reviewed cardiologist's note, she felt EKG was OK and no further cardiac work up is planned.  Lasix refill sent along with rx for kdur liquid.

## 2015-03-26 NOTE — Telephone Encounter (Signed)
Left message for pt to return my call.

## 2015-03-26 NOTE — Telephone Encounter (Signed)
Spoke with pt and she states she is unable to r/s missed appt from yesterday as her work schedule is not out for next week yet. Pt would like refills of furosemide and potassium but states it is difficult to swallow her potassium pills even cutting them in half. Pt is agreeable to try a liquid form if she cannot get a smaller size tablet. Pt also wanted to know if we received office note from her cardiology visit and if everything was ok?  Please advise.

## 2015-03-27 NOTE — Telephone Encounter (Signed)
Spoke with patient and notified her of below notes.  Notified her that Rx for Lasix and Potassium sent to [facility].  Patient stated understanding and agreed.

## 2015-03-30 ENCOUNTER — Emergency Department
Admit: 2015-03-30 | Disposition: A | Discharge status: Home / Self Care | Payer: Self-pay | Admitting: Emergency Medicine

## 2015-03-31 ENCOUNTER — Encounter: Payer: Self-pay | Admitting: Family

## 2015-03-31 ENCOUNTER — Telehealth: Payer: Self-pay | Admitting: Family

## 2015-03-31 NOTE — Telephone Encounter (Signed)
No

## 2015-03-31 NOTE — Telephone Encounter (Signed)
PT arrived more than 15 minutes late to her appt on 03/25/15- letter sent in attempt to reschedule- charge PT?

## 2015-04-17 ENCOUNTER — Other Ambulatory Visit: Payer: Self-pay | Admitting: Internal Medicine

## 2015-04-25 ENCOUNTER — Encounter: Payer: Self-pay | Admitting: Family

## 2015-04-25 ENCOUNTER — Ambulatory Visit (INDEPENDENT_AMBULATORY_CARE_PROVIDER_SITE_OTHER): Payer: BLUE CROSS/BLUE SHIELD | Admitting: Family

## 2015-04-25 VITALS — BP 128/90 | HR 90 | Temp 98.0°F | Resp 18 | Ht 67.0 in | Wt 207.2 lb

## 2015-04-25 DIAGNOSIS — E669 Obesity, unspecified: Secondary | ICD-10-CM | POA: Diagnosis not present

## 2015-04-25 DIAGNOSIS — R609 Edema, unspecified: Secondary | ICD-10-CM

## 2015-04-25 LAB — BASIC METABOLIC PANEL
BUN: 11 mg/dL (ref 6–23)
CO2: 26 mEq/L (ref 19–32)
CREATININE: 0.77 mg/dL (ref 0.50–1.10)
Calcium: 9 mg/dL (ref 8.4–10.5)
Chloride: 103 mEq/L (ref 96–112)
Glucose, Bld: 83 mg/dL (ref 70–99)
Potassium: 3.6 mEq/L (ref 3.5–5.3)
Sodium: 139 mEq/L (ref 135–145)

## 2015-04-25 NOTE — Progress Notes (Signed)
Pre visit review using our clinic review tool, if applicable. No additional management support is needed unless otherwise documented below in the visit note. 

## 2015-04-25 NOTE — Patient Instructions (Signed)
Try My Fitness Pal with goal weight loss of 1 pound per week. Please complete lab work prior to leaving. Follow up 3 months.

## 2015-04-25 NOTE — Progress Notes (Signed)
Subjective:    Patient ID: [name], female    DOB: [birth year], 49 y.o.   MRN: [id number]  HPI  [name] is a 49 yr old female who presents today for follow up.  She was last seen back in February. At that time she complained of swelling not improved by hctz.  Lasix 20mg  was added to her regimen. She was seen by cardiology for abnormal EKG, however they did not feel that EKG warranted any further work up. Reports that she still feels like she is retaining fluid. Has tried to cut back  Wt Readings from Last 3 Encounters:  04/25/15 207 lb 3.2 oz (93.985 kg)  03/19/15 205 lb (92.987 kg)  02/17/15 201 lb 9.6 oz (91.445 kg)      Review of Systems See HPI  Past Medical History  Diagnosis Date  . Asthma   . Allergic rhinitis, cause unspecified   . Unspecified essential hypertension   . History of DVT (deep vein thrombosis) 2005    Pt reports blood clot below knee ?unsure which leg.  . Nasal polyps     History   Social History  . Marital Status: Married    Spouse Name: N/A  . Number of Children: N/A  . Years of Education: N/A   Occupational History  . Manager [employer]    Social History Main Topics  . Smoking status: Never Smoker   . Smokeless tobacco: Never Used     Comment: Never Used Tobacco  . Alcohol Use: 0.0 oz/week    0 Standard drinks or equivalent per week     Comment: 1 glass of wine per year  . Drug Use: Not on file  . Sexual Activity: Not on file   Other Topics Concern  . 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      Past Surgical History  Procedure Laterality Date  . Uterine fibroid surgery  2005  . Nasal sinus surgery  3762    Dr Erik Obey  . Lasik Bilateral 2008    Family History  Problem Relation Age of Onset  . Cancer Mother 81    ?carcinoid tumor (had surgical removal)   . Diabetes Mother     borderline  . Heart disease Father     died 22, from MI, had hx of ETOH abuse and liver disease  . Kidney disease Neg Hx   . Hypertension Neg Hx   . Hyperlipidemia Neg Hx     Allergies  Allergen Reactions  . Aspirin     wheezing  . Penicillins Hives    Current Outpatient Prescriptions on File Prior to Visit  Medication Sig Dispense Refill  . albuterol (PROVENTIL) (2.5 MG/3ML) 0.083% nebulizer solution Use 1 vial in nebulizer as needed dx:496 150 mL PRN  . albuterol (PROVENTIL) 2 MG tablet Take 1 tablet (2 mg total) by mouth 2 (two) times daily. 60 tablet 5  . BREO ELLIPTA 100-25 MCG/INH AEPB INHALE ONE PUFF BY MOUTH ONCE DAILY 60 each 2  . furosemide (LASIX) 20 MG tablet Take 1 tablet (20 mg total) by mouth daily. 30 tablet 0  . ipratropium (ATROVENT) 0.02 % nebulizer solution Use 1 vial in nebulizer as needed dx. 496 150 mL PRN  . potassium chloride 20 MEQ/15ML (10%)  SOLN Take 15 mLs (20 mEq total) by mouth daily. 900 mL 0  . predniSONE (DELTASONE) 10 MG tablet 1 daily or as directed 200 tablet 5  . Prenatal Vit-Fe Sulfate-FA (PRENATAL VITAMIN PO) Take 1 capsule by mouth daily.    [name] triamcinolone (NASACORT) 55 MCG/ACT nasal inhaler 2 sprays by Nasal route daily.      No current facility-administered medications on file prior to visit.    BP 128/90 mmHg  Pulse 90  Temp(Src) 98 F (36.7 C) (Oral)  Resp 18  Ht 5\' 7"  (1.702 m)  Wt 207 lb 3.2 oz (93.985 kg)  BMI 32.44 kg/m2  SpO2 98%  LMP 04/25/2015       Objective:   Physical Exam  Constitutional: She is oriented to person, place, and time. She appears well-developed and well-nourished. No distress.  HENT:  Head: Normocephalic and atraumatic.  Cardiovascular: Normal rate and regular rhythm.   No murmur heard. Pulmonary/Chest: Effort normal and breath sounds normal. No respiratory distress. She has no wheezes. She has no rales. She exhibits no tenderness.  Neurological: She is alert and  oriented to person, place, and time.  Psychiatric: She has a normal mood and affect. Her behavior is normal. Judgment and thought content normal.          Assessment & Plan:

## 2015-04-27 ENCOUNTER — Encounter: Payer: Self-pay | Admitting: Family

## 2015-04-27 DIAGNOSIS — E669 Obesity, unspecified: Secondary | ICD-10-CM | POA: Insufficient documentation

## 2015-04-27 NOTE — Assessment & Plan Note (Signed)
We did discuss strategy for weight loss using my fitness pal to track cardio and calories.

## 2015-04-27 NOTE — Assessment & Plan Note (Addendum)
Appears clinically euvolemic. We discussed that her chronic prednisone use will cause her to hold on to fluid.  I think we have maximized the benefit we can obtain from diuretic therapy. Continue lasix and hctz, obtain follow up bmet.

## 2015-05-02 ENCOUNTER — Telehealth: Payer: Self-pay | Admitting: *Deleted

## 2015-05-02 MED ORDER — FUROSEMIDE 20 MG PO TABS: 20.0000 mg | ORAL_TABLET | Freq: Every day | ORAL | Status: DC

## 2015-05-02 NOTE — Telephone Encounter (Signed)
Pt called wanting to know recent test results. Advised pt per 04/27/15 lab letter. Pt requested refill of furosemide. Refills sent.

## 2015-07-22 ENCOUNTER — Other Ambulatory Visit: Payer: Self-pay | Admitting: Internal Medicine

## 2015-07-23 ENCOUNTER — Telehealth: Payer: Self-pay | Admitting: *Deleted

## 2015-07-23 ENCOUNTER — Other Ambulatory Visit: Payer: Self-pay | Admitting: Family

## 2015-07-23 MED ORDER — POTASSIUM CHLORIDE 20 MEQ/15ML (10%) PO SOLN: 20.0000 meq | Freq: Every day | ORAL | Status: DC

## 2015-07-23 NOTE — Telephone Encounter (Signed)
Spoke with pt and let her know I sent in the Rx potassium chloride 20MEQ/65ml to [facility] in [place].

## 2015-07-23 NOTE — Telephone Encounter (Signed)
Caller name:[name]-Sides, Nanette Relation to YK:DXIP Call back [place]  Reason for call: pt is needing rx potassium chloride 20 MEQ/15ML .

## 2015-07-23 NOTE — Telephone Encounter (Signed)
Received PA request from cover my meds. [id number] PA processed awaiting decision

## 2015-07-24 NOTE — Telephone Encounter (Signed)
Breo Ellipta approved valid 07/03/15-07/23/2013 Case Id: [id number] Pt and Pharm notified

## 2015-07-30 ENCOUNTER — Telehealth: Payer: Self-pay | Admitting: Internal Medicine

## 2015-07-30 MED ORDER — PREDNISONE 10 MG PO TABS: ORAL_TABLET | ORAL | Status: DC

## 2015-07-30 NOTE — Telephone Encounter (Signed)
Spoke with pt. She is not due for her annual until January 2017. Rx has been sent in. Nothing further was needed.

## 2015-08-04 ENCOUNTER — Encounter: Payer: Self-pay | Admitting: Family

## 2015-08-04 ENCOUNTER — Ambulatory Visit (INDEPENDENT_AMBULATORY_CARE_PROVIDER_SITE_OTHER): Payer: BLUE CROSS/BLUE SHIELD | Admitting: Family

## 2015-08-04 VITALS — BP 114/78 | HR 91 | Temp 98.1°F | Resp 18 | Ht 67.0 in | Wt 207.8 lb

## 2015-08-04 DIAGNOSIS — J309 Allergic rhinitis, unspecified: Secondary | ICD-10-CM | POA: Diagnosis not present

## 2015-08-04 DIAGNOSIS — R609 Edema, unspecified: Secondary | ICD-10-CM

## 2015-08-04 DIAGNOSIS — J4541 Moderate persistent asthma with (acute) exacerbation: Secondary | ICD-10-CM

## 2015-08-04 DIAGNOSIS — L309 Dermatitis, unspecified: Secondary | ICD-10-CM

## 2015-08-04 MED ORDER — HYDROCORTISONE VALERATE 0.2 % EX CREA: 1.0000 "application " | TOPICAL_CREAM | Freq: Two times a day (BID) | CUTANEOUS | Status: DC

## 2015-08-04 NOTE — Progress Notes (Signed)
Pre visit review using our clinic review tool, if applicable. No additional management support is needed unless otherwise documented below in the visit note. 

## 2015-08-04 NOTE — Assessment & Plan Note (Signed)
Likely due to chronic prednisone use. Had 2D echo back in 2012 for edema which showed normal LV function. Advised pt:   elevate as able.  Continue lasix for swelling. You can also purchase over the counter support hose/knee highs to wear during the day to help with swelling.

## 2015-08-04 NOTE — Assessment & Plan Note (Signed)
Lungs clear today.  Pt reports increased wheezing due to sinus drainage- add flonase, zyrtec, continue breo, prn albuterol prednisone- she knows to contact her pulmonologist if symptoms worsen or do not improve.

## 2015-08-04 NOTE — Progress Notes (Signed)
Subjective:    Patient ID: [name], female    DOB: 02-27-1966, 49 y.o.   MRN: [id number]  HPI  [name] is a 49 yr old female who presents today with several concerns:  1) Edema- bilateral LE edema x 6 months.  Continues lasix but reports that her edema is worsening. Resolves after she sleeps, progressively returns as the day wears on.   2) Asthma- Reports + sneezing, runny nose, wheezing x 2-3 days. She is not currently taking an antihistamine.   She continues breo once daily. Reports that she has needed her albuterol 3 x last week with good improvement in her symptoms.   3) Skin rash- reports rash in front of the left tear   Reports sinus drainage (with color) x 1 day- began yesterday.  She denies fever.   Review of Systems See HPI  Past Medical History  Diagnosis Date  . Asthma   . Allergic rhinitis, cause unspecified   . Unspecified essential hypertension   . History of DVT (deep vein thrombosis) 2005    Pt reports blood clot below knee ?unsure which leg.  . Nasal polyps     Social History   Social History  . Marital Status: Married    Spouse Name: N/A  . Number of Children: N/A  . Years of Education: N/A   Occupational History  . Manager [employer]    Social History Main Topics  . Smoking status: Never Smoker   . Smokeless tobacco: Never Used     Comment: Never Used Tobacco  . Alcohol Use: 0.0 oz/week    0 Standard drinks or equivalent per week     Comment: 1 glass of wine per year  . Drug Use: Not on file  . Sexual Activity: Not on file   Other Topics Concern  . 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      Past Surgical History  Procedure Laterality Date  . Uterine fibroid surgery  2005  . Nasal sinus surgery  4315    Dr Erik Obey  . Lasik Bilateral 2008    Family History    Problem Relation Age of Onset  . Cancer Mother 63    ?carcinoid tumor (had surgical removal)  . Diabetes Mother     borderline  . Heart disease Father     died 67, from MI, had hx of ETOH abuse and liver disease  . Kidney disease Neg Hx   . Hypertension Neg Hx   . Hyperlipidemia Neg Hx     Allergies  Allergen Reactions  . Aspirin     wheezing  . Penicillins Hives    Current Outpatient Prescriptions on File Prior to Visit  Medication Sig Dispense Refill  . albuterol (PROVENTIL) (2.5 MG/3ML) 0.083% nebulizer solution Use 1 vial in nebulizer as needed dx:496 150 mL PRN  . albuterol (PROVENTIL) 2 MG tablet Take 1 tablet (2 mg total) by mouth 2 (two) times daily. 60 tablet 5  . BREO ELLIPTA 100-25 MCG/INH AEPB INHALE ONE PUFF BY MOUTH ONCE DAILY 60 each 2  . furosemide (LASIX) 20 MG tablet Take 1 tablet (20 mg total) by mouth daily. 30 tablet 3  . ipratropium (ATROVENT) 0.02 % nebulizer solution Use 1 vial in nebulizer as needed dx.  496 150 mL PRN  . potassium chloride 20 MEQ/15ML (10%) SOLN Take 15 mLs (20 mEq total) by mouth daily. 900 mL 0  . predniSONE (DELTASONE) 10 MG tablet 1 daily or as directed 200 tablet 5  . Prenatal Vit-Fe Sulfate-FA (PRENATAL VITAMIN PO) Take 1 capsule by mouth daily.    [name] triamcinolone (NASACORT) 55 MCG/ACT nasal inhaler 2 sprays by Nasal route daily.      No current facility-administered medications on file prior to visit.    BP 114/78 mmHg  Pulse 91  Temp(Src) 98.1 F (36.7 C) (Oral)  Resp 18  Ht 5\' 7"  (1.702 m)  Wt 207 lb 12.8 oz (94.257 kg)  BMI 32.54 kg/m2  SpO2 98%  LMP 07/28/2015       Objective:   Physical Exam  Constitutional: She appears well-developed and well-nourished.  HENT:  Head: Normocephalic and atraumatic.  Right Ear: Tympanic membrane and ear canal normal.  Left Ear: Tympanic membrane and ear canal normal.  Mouth/Throat: No posterior oropharyngeal edema.  Cardiovascular: Normal rate, regular rhythm and normal  heart sounds.   No murmur heard. Pulmonary/Chest: Effort normal and breath sounds normal. No respiratory distress. She has no wheezes.  Skin:  Dry hyperpigmented skin in front of left ear.  Some dry skin noted external ear  Psychiatric: She has a normal mood and affect. Her behavior is normal. Judgment and thought content normal.          Assessment & Plan:  Skin rash- left ear- appears to be eczema. Will give trial of steroid cream.

## 2015-08-04 NOTE — Patient Instructions (Addendum)
Start zyrtec 10 mg once daily.  Start flonase 2 sprays to each nostril once daily (rinse with saline nasal spray prior to applying). Call if increased sinus pain/pressure, fever or if symptoms are not improved in 1 week. Continue lasix for swelling. You can also purchase over the counter support hose/knee highs to wear during the day to help with swelling.

## 2015-08-04 NOTE — Assessment & Plan Note (Signed)
Symptoms most consistent with allergic rhinitis.  Add zyrtec, flonase. If symptoms worsen or do not improve in 1 week, consider abx.

## 2015-08-08 ENCOUNTER — Telehealth: Payer: Self-pay | Admitting: Internal Medicine

## 2015-08-08 NOTE — Telephone Encounter (Signed)
Per CDY: No need for antibiotic at this time - will wait on this for now.   Pt aware of rec's per CDY at this point. Will call mid next week if no better. Nothing further needed.

## 2015-08-08 NOTE — Telephone Encounter (Signed)
Suggest she use up this prescription then call us when she needs refill and remind [place] of this problem. I will have to order 3 daily , # 270, for a 3 month supply next time.

## 2015-08-08 NOTE — Telephone Encounter (Signed)
Pt called stating that she only received 90 tabs of Prednisone even though 200 tabs was sent in. [place] is stating because of the way it was written, 1 tab daily or as directed, insurance will only cover 90 tabs. They state if we want to send in RX with different instructions to cover 200 tabs then it may cover.  Pt also is stating that her nose is running and that sometimes what comes out is yellow or green. She is asking if she should be concerned and get an antibiotic or wait.  CY please advise.

## 2015-08-08 NOTE — Telephone Encounter (Signed)
Pt aware of rec's for Rx and will call around the time that she needs this refilled.  Pt asking about rec's for nasal symptoms - this was not addressed.  Advised that I will get back with her shortly about this.  Will send to Dr Annamaria Boots to address 2nd part of message below.  Please advise. Thanks.  Allergies  Allergen Reactions  . Aspirin     wheezing  . Penicillins Hives

## 2015-08-12 ENCOUNTER — Encounter: Payer: Self-pay | Admitting: Family

## 2015-08-28 ENCOUNTER — Telehealth: Payer: Self-pay | Admitting: Internal Medicine

## 2015-08-28 NOTE — Telephone Encounter (Signed)
Patient calling requesting sample of inhaler Patient states that she is okay using ProAir respliclick if we have a free one  Patient given sample of ProAir respiclick  Nothing further needed.

## 2015-09-03 ENCOUNTER — Telehealth: Payer: Self-pay | Admitting: Internal Medicine

## 2015-09-03 NOTE — Telephone Encounter (Signed)
Spoke with pt.  Pt c/o non prod cough, chest tightness, wheezing, sinus drainage (yellow).  Started pred taper 09/02/15.  Requesting appt to be seen.  Given appt with Dr Annamaria Boots 09/04/15 at 1:30.

## 2015-09-04 ENCOUNTER — Ambulatory Visit (INDEPENDENT_AMBULATORY_CARE_PROVIDER_SITE_OTHER): Payer: BLUE CROSS/BLUE SHIELD | Admitting: Internal Medicine

## 2015-09-04 ENCOUNTER — Telehealth: Payer: Self-pay | Admitting: Internal Medicine

## 2015-09-04 ENCOUNTER — Encounter: Payer: Self-pay | Admitting: Internal Medicine

## 2015-09-04 VITALS — BP 110/72 | HR 72 | Ht 67.0 in | Wt 208.8 lb

## 2015-09-04 DIAGNOSIS — J4520 Mild intermittent asthma, uncomplicated: Secondary | ICD-10-CM

## 2015-09-04 DIAGNOSIS — J4541 Moderate persistent asthma with (acute) exacerbation: Secondary | ICD-10-CM | POA: Diagnosis not present

## 2015-09-04 MED ORDER — PREDNISONE 10 MG PO TABS: ORAL_TABLET | ORAL | Status: DC

## 2015-09-04 MED ORDER — LEVALBUTEROL HCL 0.63 MG/3ML IN NEBU
0.6300 mg | INHALATION_SOLUTION | Freq: Once | RESPIRATORY_TRACT | Status: AC
  Administered 2015-09-04: 0.63 mg via RESPIRATORY_TRACT

## 2015-09-04 MED ORDER — METHYLPREDNISOLONE ACETATE 80 MG/ML IJ SUSP
80.0000 mg | Freq: Once | INTRAMUSCULAR | Status: AC
  Administered 2015-09-04: 80 mg via INTRAMUSCULAR

## 2015-09-04 MED ORDER — ALBUTEROL SULFATE 108 (90 BASE) MCG/ACT IN AEPB: 2.0000 | INHALATION_SPRAY | Freq: Four times a day (QID) | RESPIRATORY_TRACT | Status: DC | PRN

## 2015-09-04 NOTE — Telephone Encounter (Signed)
Per CY---  This is the correct quantity for this rx.  i have called [facility] and spoke with alfred and he is aware of this and nothing further is needed.

## 2015-09-04 NOTE — Progress Notes (Signed)
Patient ID: [name], female    DOB: January 02, 1966, 49 y.o.   MRN: [id number]  Asthma Her past medical history is significant for asthma.   04/02/11-44 yo F followed for severe asthma requiring frequent steroids in past, chronic fixed obstruction, hx nasal polyps, allergic rhinitis, hx DVT.[name] Now first pregnancy with concern for complications, started on heparin.  Asthma control not good- blames weather, pollen. Has  Been on daily prednisone and started herself on a burst yesterday from 40 mg daily. Denies fever or infection. Continues Xolair, Dulera, and usually just 10 mg prednisone daily. We discussed asthma meds and pregnancy, looked up Xolair in [facility] for review with her, and asked her to discus with her high risk pregnancy physicians.   07/20/11- 70 yo F followed for severe asthma requiring frequent steroids in past, Failed Xolair, chronic fixed obstruction, hx nasal polyps, allergic rhinitis, hx DVT. Acute-Has not seen difference with Xolair now after 6 months. Has not been able to stayoff prednisone, baseline 10 mg/ day, but has put herself on a burst and taper a couple of times. Discussed steroid side effects again.  Up and down, varies with weather. Heat is hard. Feet swell a little. Last two days she has been more tight in chest. Trying again to get pregnant.  No recurrence of nasal polyps- still sees Dr Erik Obey.  09/03/04-  8 yo F followed for severe asthma (requiring frequent steroids , Failed Xolair, chronic fixed obstruction), hx nasal polyps, allergic rhinitis, hx DVT. She had flu and pneumonia vaccines 2 weeks ago. Feels that she is doing "okay" meaning she is getting by. We had stopped Xolair as unsuccessful. She stopped taking prednisone in the first week of August because she is just desperate to be away from it. Complains of generalized body aching. We discussed adrenal insufficiency and steroid withdrawal again. Noticing some watery rhinorrhea. [name] which she is  using only once daily. PFT- moderate obstructive airways disease with insignificant response to bronchodilator and mild restriction FEV1/FVC 0.54 Echo 08/04/2011-normal LV, EF 55-60% right atrium and pressures normal without pulmonary hypertension.  09/28/11- 89 yo F followed for severe asthma (requiring frequent steroids , Failed Xolair, chronic fixed obstruction), hx nasal polyps, allergic rhinitis, hx DVT. Increased wheeze during the night-used nebulizer 3 times a day before coming here. Not on prednisone. More edema in her legs, left greater than right. Left leg was involved with a DVT years ago. Denies chest pain, palpitation, bloody sputum. Thinks she may have an early cold because nasal discharge is yellow. Lasix 20 mg daily has not been helping her leg edema.  11/26/11- 33 yo F followed for severe asthma (requiring frequent steroids , Failed Xolair, chronic fixed obstruction), hx nasal polyps, allergic rhinitis, hx DVT. Has gotten flu vaccine and pneumonia vaccine at work. Says she feels pretty well for this visit. Has been taking 10 mg of prednisone after her last taper. She expects some wheeze and cough most days. Denies purulent sputum reflux or sinus drainage. Controls ankle edema with Demadex 10 mg taken on most days.  06/12/12- 37 yo F followed for severe asthma (requiring frequent steroids , Failed Xolair, chronic fixed obstruction), hx nasal polyps, allergic rhinitis, hx DVT. Had to recently take prednisone for flare up and now having edema in ankles again-would like demadex to help with this-also K+ as well. The past 7-10 days she has had increased wheeze, chest tightness-not a cold. She asks we rewrite prednisone to specify taper directions so that she can  get it cheaper.  10/18/12- 47 yo F followed for severe asthma (requiring frequent steroids , Failed Xolair, chronic fixed obstruction), hx nasal polyps, allergic rhinitis, hx DVT. Patient states that she has recently had a flare  up--treated with Prednisone taper, now pred 10mg  daily. Pt has some questions in regards to her medical file--she was denied additional insurance d/t something in her file.  PFT- 09/09/11- FEV1 1.64/ 54%, FEV1/FVC 0.54, FEF25-75% 0.74/ 22%, insignif response to bronchodilator TLC 0.74, DLCO 72%. Moderate obstructive disease, mild restriction, mild reduction of DLCO. Office spirometry- mild obstructive airways disease. FEV1 1.95/73%, FVC 3.01/93%, FEV1/FVC 0.65, FEF 25-75% 1.26/38%. Weather change can trigger a flare. Needed prednisone burst but now back to maintenance 10 mg daily. Has not been off of prednisone in a long time, gets tight when she tries. Wheeze today is typical but she says the effort to blow into this from her head left or wheezier than on arrival. She is not using her Dulera maintenance inhaler and we discussed this. Uses nebulizer occasionally, albuterol tablet twice daily which we discussed, rescue inhaler twice daily. I emphasized the role of maintenance anti-inflammatory control medicines.  09/26/13- 20 yo F followed for severe asthma (requiring frequent steroids , Failed Xolair, chronic fixed obstruction), hx nasal polyps, allergic rhinitis, hx DVT FOLLOWS FOR: cough-productive-yellow and clear at times; wheezing, and slight SOB as well. Returned from [facility] [employer] [facility] with increased cough, some wheeze and clear phlegm but no fever or sore throat Out of prednisone and feels she needs taper. I talked with her about cumulative prednisone side effects again.   12/05/13- 31 yo F followed for severe asthma (requiring frequent steroids , Failed Xolair, chronic fixed obstruction), hx nasal polyps, allergic rhinitis, hx DVT FOLLOWS FOR: Breathing has gotten worse since last OV. Reports severe wheezing, SOB, chest tightness. Onset was 3 days ago. Acute illness with increased cough, wheeze, white or yellow sputum but denies fever, sore throat and does not feel "sick". Currently tapering  prednisone.  12/26/14- 57 yo F followed for severe asthma (requiring frequent steroids , Failed Xolair, chronic fixed obstruction), hx nasal polyps, allergic rhinitis, hx DVT FOLLOWS FOR: Pt c/o wheezing x 1 week (with weather change). Denies Cough and SOB.  Stable for longer periods now but has had a recent exacerbation with a cold. Still using albuterol tablets 2 mg twice daily. Last prednisone taper was over a month ago. Uses nebulizer about twice a week and maintenance prednisone 10 mg daily.  09/04/15- 62 yo F followed for moderate persistent asthma asthma (requiring frequent steroids , Failed Xolair, , hx nasal polyps, allergic rhinitis, hx DVT               had flu vaccine and hepatitis B vaccine this year Acute illness began 2 or 3 days ago with watery nose then increased wheezing. She started herself on a prednisone taper at 20 mg as instructed, working back towards her daily maintenance 10 mg. Using her nebulizer. Before this she had done well, not even needing her rescue inhaler since January. Bone Density was normal CXR 12/26/14-  IMPRESSION: No acute cardiopulmonary abnormality seen. Electronically Signed  By: Sabino Dick M.D.  On: 12/26/2014 16:53  Review of Systems-See HPI Constitutional:   No weight loss, night sweats,  Fevers, chills, fatigue, lassitude. HEENT:   No headaches,  Difficulty swallowing,  Tooth/dental problems,  Sore throat,                No sneezing, itching, ear ache,, post nasal  drip. Some nasal congestion. CV:  No chest pain,  Orthopnea, PND, swelling in lower extremities, anasarca, dizziness, palpitations GI  No heartburn, indigestion, abdominal pain, nausea, vomiting, Resp:Usually some shortness of breath with exertion, not at rest.  No excess mucus, no- productive cough,          + non-productive cough,  No coughing up of blood.  + change in color of mucus.  + wheezing.   Skin: no rash or lesions. GU: . MS:  No joint pain, + swelling.   Neuro- nothing  unusual Psych:  No change in mood or affect. No depression or anxiety.  No memory loss. Objective:   Physical Exam  General- Alert, Oriented, Affect-appropriate, Distress- none acute  Relaxed and conversational   overweight Skin- rash-none, lesions- none, excoriation- none Lymphadenopathy- none Head- atraumatic            Eyes- Gross vision intact, PERRLA, conjunctivae clear secretions            Ears- Hearing, canals normal            Nose- Clear, No-Septal dev, mucus, , erosion, perforation.             Throat- Mallampati II-III, mucosa -trace thrush , drainage- none, tonsils- atrophic Neck- flexible , trachea midline, no stridor , thyroid nl, carotid no bruit Chest - symmetrical excursion , unlabored           Heart/CV- RRR , no murmur , no gallop  , no rub, nl s1 s2                           - JVD- none , edema 1-2+, , stasis changes- none, varices- none           Lung- + raspy without wheeze,  unlabored, cough + , dullness-none, rub- none           Chest wall-  Abd-  Br/ Gen/ Rectal- Not done, not indicated Extrem- cyanosis- none, clubbing, none, atrophy- none, strength- nl.  Neuro- grossly intact to observation

## 2015-09-04 NOTE — Patient Instructions (Addendum)
Neb xop 0.63  Depo 80  Script sent for prednisone refill  When you are well enough to be off prednisone for a week or so, I would like to get blood drawn for a CBC with diff, to check for eosinophils   Rx for ProAir Respiclick with coupon card given to patient.

## 2015-09-04 NOTE — Telephone Encounter (Signed)
The pharmacy is calling to make sure that you wanted to give the pt #200 tablets of the prednisone on her rx that was sent in today. CY please advise. thanks

## 2015-09-06 NOTE — Assessment & Plan Note (Addendum)
Chronic severe asthma now maintained with prednisone 10 mg daily. Previously failed Xolair Plan-nebulizer Xopenex, Depo-Medrol today. If within ever get her off of prednisone long enough I would like to check eosinophil level, considering Nucala

## 2015-09-09 ENCOUNTER — Telehealth: Payer: Self-pay | Admitting: Internal Medicine

## 2015-09-09 MED ORDER — AZITHROMYCIN 250 MG PO TABS: 250.0000 mg | ORAL_TABLET | Freq: Every day | ORAL | Status: DC

## 2015-09-09 NOTE — Telephone Encounter (Signed)
lmtcb for pt.  

## 2015-09-09 NOTE — Telephone Encounter (Signed)
Pt returning call.[name] ° °

## 2015-09-09 NOTE — Telephone Encounter (Signed)
Per CY-okay to give patient Zpak # 1 take as directed no refills.  Pt is aware of Rx sent to [facility]. Nothing more needed at this time.

## 2015-09-09 NOTE — Telephone Encounter (Signed)
Pt states she saw CY last week; got neb tx and depo injection. Pt continues to cough-productive-clear to green in color and nasal drainage. Pt is concerned that she may have lingering infection. CY please advise. Thanks.

## 2015-09-11 ENCOUNTER — Ambulatory Visit: Admit: 2015-09-11 | Payer: BLUE CROSS/BLUE SHIELD | Admitting: Obstetrics and Gynecology

## 2015-09-11 SURGERY — DILATATION AND CURETTAGE /HYSTEROSCOPY
Anesthesia: Choice

## 2015-10-09 ENCOUNTER — Other Ambulatory Visit: Payer: Self-pay | Admitting: Family

## 2015-10-09 MED ORDER — FUROSEMIDE 20 MG PO TABS: 20.0000 mg | ORAL_TABLET | Freq: Every day | ORAL | Status: DC

## 2015-10-09 NOTE — Telephone Encounter (Signed)
Lasix sent to [facility].    Pt wants to know does PCP plan to keep her on lasix long term?  She continues to experience swelling in legs.  She does not wear support hose as recommended.  Please advise.  Note: Appt scheduled for Monday, Oct 24 for issue below.  She also has a "spot" on her belly that she would like to talk to Hope about.  States sometimes it looks like a knot and sometimes it's not present.  Located in the middle of stomach, slightly to the left.  It does not bother patient.  Appointment scheduled for Monday, Oct 24 at 3:45 pm per patient's request.

## 2015-10-09 NOTE — Telephone Encounter (Signed)
Caller name: Stevey Sides  Relationship to patient: Self   Can be reached: [phone]  Pharmacy: [facility] [address] [place]  Reason for call: pt is requesting a refill on her LASIX Rx.    Pt is requesting a call back to discuss medication further.

## 2015-10-10 NOTE — Telephone Encounter (Signed)
Yes, we will likely continue lasix long term, especially if she continues to have swelling.

## 2015-10-10 NOTE — Telephone Encounter (Signed)
Pt notified and made aware.  She says she will pick up Rx and look for support hose today.  She also plans to keep appt as scheduled.

## 2015-10-13 ENCOUNTER — Ambulatory Visit: Payer: BLUE CROSS/BLUE SHIELD | Admitting: Family

## 2015-10-13 ENCOUNTER — Telehealth: Payer: Self-pay | Admitting: Family

## 2015-10-13 NOTE — Telephone Encounter (Signed)
No charge. 

## 2015-10-13 NOTE — Telephone Encounter (Signed)
Pt will no show today 3:45pm, she said she has to go to work, she rescheduled for 10/20/15. She wanted to speak with Ashlee but she was unavailable at the time of call. Charge or no charge?

## 2015-10-14 NOTE — Telephone Encounter (Signed)
Called back to verify that patient did not have any additional needs or concerns.  Pt states she was only calling to reschedule appt.  As noted below appt has been rescheduled.  No further actions needed at this time.

## 2015-10-20 ENCOUNTER — Ambulatory Visit (INDEPENDENT_AMBULATORY_CARE_PROVIDER_SITE_OTHER): Payer: BLUE CROSS/BLUE SHIELD | Admitting: Family

## 2015-10-20 ENCOUNTER — Encounter: Payer: Self-pay | Admitting: Family

## 2015-10-20 VITALS — HR 90 | Temp 98.2°F | Resp 16 | Ht 67.0 in | Wt 207.0 lb

## 2015-10-20 DIAGNOSIS — R609 Edema, unspecified: Secondary | ICD-10-CM | POA: Diagnosis not present

## 2015-10-20 DIAGNOSIS — K921 Melena: Secondary | ICD-10-CM

## 2015-10-20 DIAGNOSIS — R739 Hyperglycemia, unspecified: Secondary | ICD-10-CM

## 2015-10-20 DIAGNOSIS — K625 Hemorrhage of anus and rectum: Secondary | ICD-10-CM

## 2015-10-20 DIAGNOSIS — J4541 Moderate persistent asthma with (acute) exacerbation: Secondary | ICD-10-CM

## 2015-10-20 NOTE — Progress Notes (Signed)
Subjective:    Patient ID: [name], female    DOB: January 14, 1966, 49 y.o.   MRN: [id number]  HPI   [name] is a 49 yr old female who presents today for follow up.  1) Abdominal skin discomfort-  Reports that  Skin on abdomen is tender x 2 weeks but no rash is noted.    2) Hypoglycemia- reports that she had her blood pressure checked through her employer and it was 72. This was 3 hours post prandial.  This was a CBG.  Was asymptomatic.   3) irregular menses- scheduled for D and C on 10/28/15.  She has hx of fibroids.  She follows with Dr. Philis Pique. Pt reports thick endometrium.    4)  Rectal Bleeding-  Reports blood on tissue after BM since last week. She thinks that she has hx of hemorrhoids.    5) Asthma- reports that this is well controlled. She is followed by Dr. Keitha Butte.  Review of Systems See HPI  Past Medical History  Diagnosis Date  . Asthma   . Allergic rhinitis, cause unspecified   . Unspecified essential hypertension   . History of DVT (deep vein thrombosis) 2005    Pt reports blood clot below knee ?unsure which leg.  . Nasal polyps     Social History   Social History  . Marital Status: Married    Spouse Name: N/A  . Number of Children: N/A  . Years of Education: N/A   Occupational History  . Manager [employer]    Social History Main Topics  . Smoking status: Never Smoker   . Smokeless tobacco: Never Used     Comment: Never Used Tobacco  . Alcohol Use: 0.0 oz/week    0 Standard drinks or equivalent per week     Comment: 1 glass of wine per year  . Drug Use: Not on file  . Sexual Activity: Not on file   Other Topics Concern  . 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      Past Surgical History  Procedure Laterality Date  . Uterine fibroid surgery  2005  . Nasal sinus  surgery  6803    Dr Erik Obey  . Lasik Bilateral 2008    Family History  Problem Relation Age of Onset  . Cancer Mother 64    ?carcinoid tumor (had surgical removal)  . Diabetes Mother     borderline  . Heart disease Father     died 18, from MI, had hx of ETOH abuse and liver disease  . Kidney disease Neg Hx   . Hypertension Neg Hx   . Hyperlipidemia Neg Hx     Allergies  Allergen Reactions  . Aspirin     wheezing  . Penicillins Hives    Current Outpatient Prescriptions on File Prior to Visit  Medication Sig Dispense Refill  . albuterol (PROVENTIL) (2.5 MG/3ML) 0.083% nebulizer solution Use 1 vial in nebulizer as needed dx:496 150 mL PRN  . albuterol (PROVENTIL) 2 MG tablet Take 1 tablet (2 mg total) by mouth 2 (two) times daily. 60 tablet 5  . Albuterol Sulfate (PROAIR RESPICLICK) 212 (90 BASE) MCG/ACT AEPB Inhale 2 puffs into the lungs 4 (four) times daily as needed. 1 each 0  . BREO ELLIPTA  100-25 MCG/INH AEPB INHALE ONE PUFF BY MOUTH ONCE DAILY 60 each 2  . furosemide (LASIX) 20 MG tablet Take 1 tablet (20 mg total) by mouth daily. 90 tablet 0  . hydrocortisone valerate cream (WESTCORT) 0.2 % Apply 1 application topically 2 (two) times daily. 15 g 0  . ipratropium (ATROVENT) 0.02 % nebulizer solution Use 1 vial in nebulizer as needed dx. 496 150 mL PRN  . potassium chloride 20 MEQ/15ML (10%) SOLN Take 15 mLs (20 mEq total) by mouth daily. 900 mL 0  . predniSONE (DELTASONE) 10 MG tablet 1 daily or as directed 200 tablet 5  . Prenatal Vit-Fe Sulfate-FA (PRENATAL VITAMIN PO) Take 1 capsule by mouth daily.    [name] triamcinolone (NASACORT) 55 MCG/ACT nasal inhaler 2 sprays by Nasal route daily.      No current facility-administered medications on file prior to visit.    Pulse 90  Temp(Src) 98.2 F (36.8 C) (Oral)  Resp 16  Ht 5\' 7"  (1.702 m)  Wt 207 lb (93.895 kg)  BMI 32.41 kg/m2  SpO2 100%  LMP 10/11/2015       Objective:   Physical Exam  HENT:  Head:  Normocephalic and atraumatic.  Cardiovascular: Normal rate and regular rhythm.   No murmur heard. Pulmonary/Chest: Effort normal and breath sounds normal. No respiratory distress. She has no wheezes. She has no rales. She exhibits no tenderness.  Abdominal: Soft. She exhibits no distension. There is no tenderness.  Normal abdominal skin exam.   Genitourinary:  + external hemorrhoid noted  Musculoskeletal:  1+ bilateral LE edema          Assessment & Plan:  Abdominal skin discomfort- suspect that she had a small hematoma beneath the skin which is now resolved.    Asymptomatic hypoglycemia- monitor. Will obtain follow up A1C.  Lab Results  Component Value Date   HGBA1C 6.2 01/13/2015

## 2015-10-20 NOTE — Assessment & Plan Note (Signed)
Stable, management per pulmonary.

## 2015-10-20 NOTE — Progress Notes (Signed)
Pre visit review using our clinic review tool, if applicable. No additional management support is needed unless otherwise documented below in the visit note. 

## 2015-10-20 NOTE — Patient Instructions (Signed)
Apply preparation H twice daily. Keep stool soft by drinking lots of water and eating lots of fresh fruits/veggies. After you complete your menstrual cycle, please complete hemocult (stool test) and return by mail Complete lab work prior to leaving.

## 2015-10-20 NOTE — Assessment & Plan Note (Signed)
Improved on lasix, continue same.

## 2015-10-20 NOTE — Assessment & Plan Note (Signed)
Suspect secondary to hemorrhoids. Will obtain CBC. Advise pt as follows: Apply preparation H twice daily. Keep stool soft by drinking lots of water and eating lots of fresh fruits/veggies. After you complete your menstrual cycle, please complete hemocult (stool test) and return by mail

## 2015-10-21 LAB — CBC WITH DIFFERENTIAL/PLATELET
BASOS PCT: 0.7 % (ref 0.0–3.0)
Basophils Absolute: 0.1 10*3/uL (ref 0.0–0.1)
EOS ABS: 0.4 10*3/uL (ref 0.0–0.7)
EOS PCT: 4 % (ref 0.0–5.0)
HEMATOCRIT: 39.1 % (ref 36.0–46.0)
HEMOGLOBIN: 12.7 g/dL (ref 12.0–15.0)
Lymphocytes Relative: 17.3 % (ref 12.0–46.0)
Lymphs Abs: 1.8 10*3/uL (ref 0.7–4.0)
MCHC: 32.5 g/dL (ref 30.0–36.0)
MCV: 80.9 fl (ref 78.0–100.0)
MONO ABS: 0.7 10*3/uL (ref 0.1–1.0)
Monocytes Relative: 6.9 % (ref 3.0–12.0)
NEUTROS ABS: 7.3 10*3/uL (ref 1.4–7.7)
Neutrophils Relative %: 71.1 % (ref 43.0–77.0)
PLATELETS: 309 10*3/uL (ref 150.0–400.0)
RBC: 4.84 Mil/uL (ref 3.87–5.11)
RDW: 14.3 % (ref 11.5–15.5)
WBC: 10.3 10*3/uL (ref 4.0–10.5)

## 2015-10-21 LAB — HEMOGLOBIN A1C: Hgb A1c MFr Bld: 6 % (ref 4.6–6.5)

## 2015-10-22 ENCOUNTER — Encounter: Payer: Self-pay | Admitting: Family

## 2015-10-24 NOTE — Addendum Note (Signed)
Addended by: Kelle Darting A on: 10/24/2015 08:23 AM   Modules accepted: Orders

## 2015-12-05 ENCOUNTER — Telehealth: Payer: Self-pay | Admitting: Internal Medicine

## 2015-12-05 MED ORDER — PREDNISONE 10 MG PO TABS: ORAL_TABLET | ORAL | Status: DC

## 2015-12-05 MED ORDER — DOXYCYCLINE HYCLATE 100 MG PO TABS: ORAL_TABLET | ORAL | Status: DC

## 2015-12-05 NOTE — Telephone Encounter (Signed)
Spoke with pt and is aware of recs. RX's sent in. Nothing further needed

## 2015-12-05 NOTE — Telephone Encounter (Signed)
Spoke with pt. C/o blowing out yellow-green phelm, nasal cong, wheezing/chest tx, slight PND. Denies any cough, no f/c/s/n/v. Please advise Dr. Annamaria Boots thanks  Allergies  Allergen Reactions  . Aspirin     wheezing  . Penicillins Hives     Current Outpatient Prescriptions on File Prior to Visit  Medication Sig Dispense Refill  . albuterol (PROVENTIL) (2.5 MG/3ML) 0.083% nebulizer solution Use 1 vial in nebulizer as needed dx:496 150 mL PRN  . albuterol (PROVENTIL) 2 MG tablet Take 1 tablet (2 mg total) by mouth 2 (two) times daily. 60 tablet 5  . Albuterol Sulfate (PROAIR RESPICLICK) 123XX123 (90 BASE) MCG/ACT AEPB Inhale 2 puffs into the lungs 4 (four) times daily as needed. 1 each 0  . BREO ELLIPTA 100-25 MCG/INH AEPB INHALE ONE PUFF BY MOUTH ONCE DAILY 60 each 2  . furosemide (LASIX) 20 MG tablet Take 1 tablet (20 mg total) by mouth daily. 90 tablet 0  . hydrocortisone valerate cream (WESTCORT) 0.2 % Apply 1 application topically 2 (two) times daily. 15 g 0  . ipratropium (ATROVENT) 0.02 % nebulizer solution Use 1 vial in nebulizer as needed dx. 496 150 mL PRN  . potassium chloride 20 MEQ/15ML (10%) SOLN Take 15 mLs (20 mEq total) by mouth daily. 900 mL 0  . predniSONE (DELTASONE) 10 MG tablet 1 daily or as directed 200 tablet 5  . Prenatal Vit-Fe Sulfate-FA (PRENATAL VITAMIN PO) Take 1 capsule by mouth daily.    [name] triamcinolone (NASACORT) 55 MCG/ACT nasal inhaler 2 sprays by Nasal route daily.      No current facility-administered medications on file prior to visit.

## 2015-12-05 NOTE — Telephone Encounter (Signed)
Offer doxycycline 100 mg, # 8, 2 today then one daily           Prednisone 10 mg, # 8, 4 X 2 DAYS, 3 X 2 DAYS, 2 X 2 DAYS, 1 X 2 DAYS           Stay well hydrated and avoid getting chilled

## 2015-12-10 ENCOUNTER — Other Ambulatory Visit (INDEPENDENT_AMBULATORY_CARE_PROVIDER_SITE_OTHER): Payer: Self-pay

## 2015-12-10 DIAGNOSIS — K921 Melena: Secondary | ICD-10-CM

## 2015-12-10 DIAGNOSIS — K625 Hemorrhage of anus and rectum: Secondary | ICD-10-CM

## 2015-12-10 LAB — FECAL OCCULT BLOOD, IMMUNOCHEMICAL: FECAL OCCULT BLD: NEGATIVE

## 2015-12-11 ENCOUNTER — Encounter: Payer: Self-pay | Admitting: Family

## 2015-12-21 HISTORY — PX: DILATION AND CURETTAGE OF UTERUS: SHX78

## 2016-01-06 ENCOUNTER — Telehealth: Payer: Self-pay | Admitting: Family

## 2016-01-06 MED ORDER — POTASSIUM CHLORIDE 20 MEQ/15ML (10%) PO SOLN: 20.0000 meq | Freq: Every day | ORAL | Status: DC

## 2016-01-06 NOTE — Telephone Encounter (Signed)
Pharmacy: [facility] [place], [address].  Reason for call: pt needing refill on potassium. She has 2 doses. Pt request 90 days. Advised pt to schedule 3 month f/u (last seen in 09/2015). Pt declined scheduling right now and said she has to look at her days off and will call back.

## 2016-01-06 NOTE — Telephone Encounter (Signed)
Melissa-- please advise below request?

## 2016-02-18 HISTORY — PX: OVARIAN CYST REMOVAL: SHX89

## 2016-02-19 ENCOUNTER — Telehealth: Payer: Self-pay | Admitting: Internal Medicine

## 2016-02-19 NOTE — Telephone Encounter (Signed)
Spoke with pt. Offered her 2 different appointments for tomorrow, she declined both. OV has been scheduled with TP on 03/02/16 at 4pm. Pt also wanted coupons for [employer] and Breo. These have been left at the front desk for pick up. Nothing further was needed.

## 2016-02-26 ENCOUNTER — Other Ambulatory Visit: Payer: Self-pay | Admitting: Family

## 2016-02-26 ENCOUNTER — Telehealth: Payer: Self-pay | Admitting: Internal Medicine

## 2016-02-26 NOTE — Telephone Encounter (Signed)
Pharmacy: [facility] [address] [place]  Reason for call: pt needing refill on furosemide. Pt declined scheduling f/u appt at this time stating she has to check her work schedule.

## 2016-02-26 NOTE — Telephone Encounter (Signed)
Spoke with patient-wanted to check to see about seeing CY sooner than 03-04-16. Pt has been scheduled to see CY on Monday 03-01-16 at 11:15am. Nothing more needed at this time.

## 2016-02-27 MED ORDER — FUROSEMIDE 20 MG PO TABS: 20.0000 mg | ORAL_TABLET | Freq: Every day | ORAL | Status: DC

## 2016-02-27 NOTE — Telephone Encounter (Signed)
Rx sent 

## 2016-03-01 ENCOUNTER — Telehealth: Payer: Self-pay | Admitting: Internal Medicine

## 2016-03-01 ENCOUNTER — Other Ambulatory Visit: Payer: Self-pay | Admitting: *Deleted

## 2016-03-01 ENCOUNTER — Encounter: Payer: Self-pay | Admitting: Internal Medicine

## 2016-03-01 ENCOUNTER — Ambulatory Visit (INDEPENDENT_AMBULATORY_CARE_PROVIDER_SITE_OTHER): Payer: BLUE CROSS/BLUE SHIELD | Admitting: Internal Medicine

## 2016-03-01 VITALS — BP 118/86 | HR 98 | Ht 67.0 in | Wt 203.0 lb

## 2016-03-01 DIAGNOSIS — J0101 Acute recurrent maxillary sinusitis: Secondary | ICD-10-CM

## 2016-03-01 MED ORDER — FLUTICASONE FUROATE-VILANTEROL 100-25 MCG/INH IN AEPB: INHALATION_SPRAY | RESPIRATORY_TRACT | Status: DC

## 2016-03-01 MED ORDER — METHYLPREDNISOLONE ACETATE 80 MG/ML IJ SUSP
80.0000 mg | Freq: Once | INTRAMUSCULAR | Status: AC
  Administered 2016-03-01: 80 mg via INTRAMUSCULAR

## 2016-03-01 MED ORDER — PREDNISONE 10 MG PO TABS: ORAL_TABLET | ORAL | Status: DC

## 2016-03-01 MED ORDER — ALBUTEROL SULFATE 108 (90 BASE) MCG/ACT IN AEPB: 2.0000 | INHALATION_SPRAY | Freq: Four times a day (QID) | RESPIRATORY_TRACT | Status: DC | PRN

## 2016-03-01 MED ORDER — PHENYLEPHRINE HCL 1 % NA SOLN
3.0000 [drp] | Freq: Once | NASAL | Status: AC
  Administered 2016-03-01: 3 [drp] via NASAL

## 2016-03-01 MED ORDER — AZITHROMYCIN 250 MG PO TABS: ORAL_TABLET | ORAL | Status: DC

## 2016-03-01 NOTE — Patient Instructions (Signed)
Scripts sent to [facility] for prednisone tablets and Z pak  Neb neo nasal           Dx recurrent acute maxillary sinusitis  Depo 80

## 2016-03-01 NOTE — Progress Notes (Signed)
Patient ID: [name], female    DOB: January 02, 1966, 50 y.o.   MRN: [id number]  Asthma Her past medical history is significant for asthma.   04/02/11-44 yo F followed for severe asthma requiring frequent steroids in past, chronic fixed obstruction, hx nasal polyps, allergic rhinitis, hx DVT.[name] Now first pregnancy with concern for complications, started on heparin.  Asthma control not good- blames weather, pollen. Has  Been on daily prednisone and started herself on a burst yesterday from 40 mg daily. Denies fever or infection. Continues Xolair, Dulera, and usually just 10 mg prednisone daily. We discussed asthma meds and pregnancy, looked up Xolair in [employer] for review with her, and asked her to discus with her high risk pregnancy physicians.   07/20/11- 70 yo F followed for severe asthma requiring frequent steroids in past, Failed Xolair, chronic fixed obstruction, hx nasal polyps, allergic rhinitis, hx DVT. Acute-Has not seen difference with Xolair now after 6 months. Has not been able to stayoff prednisone, baseline 10 mg/ day, but has put herself on a burst and taper a couple of times. Discussed steroid side effects again.  Up and down, varies with weather. Heat is hard. Feet swell a little. Last two days she has been more tight in chest. Trying again to get pregnant.  No recurrence of nasal polyps- still sees Dr Erik Obey.  09/03/04-  8 yo F followed for severe asthma (requiring frequent steroids , Failed Xolair, chronic fixed obstruction), hx nasal polyps, allergic rhinitis, hx DVT. She had flu and pneumonia vaccines 2 weeks ago. Feels that she is doing "okay" meaning she is getting by. We had stopped Xolair as unsuccessful. She stopped taking prednisone in the first week of August because she is just desperate to be away from it. Complains of generalized body aching. We discussed adrenal insufficiency and steroid withdrawal again. Noticing some watery rhinorrhea. [name] which she is  using only once daily. PFT- moderate obstructive airways disease with insignificant response to bronchodilator and mild restriction FEV1/FVC 0.54 Echo 08/04/2011-normal LV, EF 55-60% right atrium and pressures normal without pulmonary hypertension.  09/28/11- 89 yo F followed for severe asthma (requiring frequent steroids , Failed Xolair, chronic fixed obstruction), hx nasal polyps, allergic rhinitis, hx DVT. Increased wheeze during the night-used nebulizer 3 times a day before coming here. Not on prednisone. More edema in her legs, left greater than right. Left leg was involved with a DVT years ago. Denies chest pain, palpitation, bloody sputum. Thinks she may have an early cold because nasal discharge is yellow. Lasix 20 mg daily has not been helping her leg edema.  11/26/11- 33 yo F followed for severe asthma (requiring frequent steroids , Failed Xolair, chronic fixed obstruction), hx nasal polyps, allergic rhinitis, hx DVT. Has gotten flu vaccine and pneumonia vaccine at work. Says she feels pretty well for this visit. Has been taking 10 mg of prednisone after her last taper. She expects some wheeze and cough most days. Denies purulent sputum reflux or sinus drainage. Controls ankle edema with Demadex 10 mg taken on most days.  06/12/12- 37 yo F followed for severe asthma (requiring frequent steroids , Failed Xolair, chronic fixed obstruction), hx nasal polyps, allergic rhinitis, hx DVT. Had to recently take prednisone for flare up and now having edema in ankles again-would like demadex to help with this-also K+ as well. The past 7-10 days she has had increased wheeze, chest tightness-not a cold. She asks we rewrite prednisone to specify taper directions so that she can  get it cheaper.  10/18/12- 70 yo F followed for severe asthma (requiring frequent steroids , Failed Xolair, chronic fixed obstruction), hx nasal polyps, allergic rhinitis, hx DVT. Patient states that she has recently had a flare  up--treated with Prednisone taper, now pred 10mg  daily. Pt has some questions in regards to her medical file--she was denied additional insurance d/t something in her file.  PFT- 09/09/11- FEV1 1.64/ 54%, FEV1/FVC 0.54, FEF25-75% 0.74/ 22%, insignif response to bronchodilator TLC 0.74, DLCO 72%. Moderate obstructive disease, mild restriction, mild reduction of DLCO. Office spirometry- mild obstructive airways disease. FEV1 1.95/73%, FVC 3.01/93%, FEV1/FVC 0.65, FEF 25-75% 1.26/38%. Weather change can trigger a flare. Needed prednisone burst but now back to maintenance 10 mg daily. Has not been off of prednisone in a long time, gets tight when she tries. Wheeze today is typical but she says the effort to blow into this from her head left or wheezier than on arrival. She is not using her Dulera maintenance inhaler and we discussed this. Uses nebulizer occasionally, albuterol tablet twice daily which we discussed, rescue inhaler twice daily. I emphasized the role of maintenance anti-inflammatory control medicines.  09/26/13- 82 yo F followed for severe asthma (requiring frequent steroids , Failed Xolair, chronic fixed obstruction), hx nasal polyps, allergic rhinitis, hx DVT FOLLOWS FOR: cough-productive-yellow and clear at times; wheezing, and slight SOB as well. Returned from [place] with increased cough, some wheeze and clear phlegm but no fever or sore throat Out of prednisone and feels she needs taper. I talked with her about cumulative prednisone side effects again.   12/05/13- 12 yo F followed for severe asthma (requiring frequent steroids , Failed Xolair, chronic fixed obstruction), hx nasal polyps, allergic rhinitis, hx DVT FOLLOWS FOR: Breathing has gotten worse since last OV. Reports severe wheezing, SOB, chest tightness. Onset was 3 days ago. Acute illness with increased cough, wheeze, white or yellow sputum but denies fever, sore throat and does not feel "sick". Currently tapering  prednisone.  12/26/14- 55 yo F followed for severe asthma (requiring frequent steroids , Failed Xolair, chronic fixed obstruction), hx nasal polyps, allergic rhinitis, hx DVT FOLLOWS FOR: Pt c/o wheezing x 1 week (with weather change). Denies Cough and SOB.  Stable for longer periods now but has had a recent exacerbation with a cold. Still using albuterol tablets 2 mg twice daily. Last prednisone taper was over a month ago. Uses nebulizer about twice a week and maintenance prednisone 10 mg daily.  09/04/15- 54 yo F followed for moderate persistent asthma asthma (requiring frequent steroids , Failed Xolair, , hx nasal polyps, allergic rhinitis, hx DVT               had flu vaccine and hepatitis B vaccine this year Acute illness began 2 or 3 days ago with watery nose then increased wheezing. She started herself on a prednisone taper at 20 mg as instructed, working back towards her daily maintenance 10 mg. Using her nebulizer. Before this she had done well, not even needing her rescue inhaler since January. Bone Density was normal CXR 12/26/14-  IMPRESSION: No acute cardiopulmonary abnormality seen. Electronically Signed  By: Sabino Dick M.D.  On: 12/26/2014 16:53  03/01/2016-50 year old female never smoker followed for moderate persistent asthma (frequent steroids, failed Xolair), history nasal polyps, allergic rhinitis, history DVT FOLLOWS FOR: Breathing is worse since last OV. Reports SOB, coughing with production of green mucus and wheezing. Denies chest tightness.   Review of Systems-See HPI Constitutional:   No weight loss,  night sweats,  Fevers, chills, fatigue, lassitude. HEENT:   No headaches,  Difficulty swallowing,  Tooth/dental problems,  Sore throat,                No sneezing, itching, ear ache,, post nasal drip. Some nasal congestion. CV:  No chest pain,  Orthopnea, PND, swelling in lower extremities, anasarca, dizziness, palpitations GI  No heartburn, indigestion, abdominal pain,  nausea, vomiting, Resp:Usually some shortness of breath with exertion, not at rest.  No excess mucus, no- productive cough,          + non-productive cough,  No coughing up of blood.  + change in color of mucus.  + wheezing.   Skin: no rash or lesions. GU: . MS:  No joint pain, + swelling.   Neuro- nothing unusual Psych:  No change in mood or affect. No depression or anxiety.  No memory loss. Objective:   Physical Exam  General- Alert, Oriented, Affect-appropriate, Distress- none acute  Relaxed and conversational   overweight Skin- rash-none, lesions- none, excoriation- none Lymphadenopathy- none Head- atraumatic            Eyes- Gross vision intact, PERRLA, conjunctivae clear secretions            Ears- Hearing, canals normal            Nose- Clear, No-Septal dev, mucus, , erosion, perforation.             Throat- Mallampati II-III, mucosa -trace thrush , drainage- none, tonsils- atrophic Neck- flexible , trachea midline, no stridor , thyroid nl, carotid no bruit Chest - symmetrical excursion , unlabored           Heart/CV- RRR , no murmur , no gallop  , no rub, nl s1 s2                           - JVD- none , edema 1-2+, , stasis changes- none, varices- none           Lung- + raspy without wheeze,  unlabored, cough + , dullness-none, rub- none           Chest wall-  Abd-  Br/ Gen/ Rectal- Not done, not indicated Extrem- cyanosis- none, clubbing, none, atrophy- none, strength- nl.  Neuro- grossly intact to observation

## 2016-03-01 NOTE — Telephone Encounter (Signed)
Patient calling to get refill on Breo and ProAir.   Rx sent to pharmacy. Patient aware. Nothing further needed.

## 2016-03-02 ENCOUNTER — Telehealth: Payer: Self-pay | Admitting: Internal Medicine

## 2016-03-02 ENCOUNTER — Ambulatory Visit: Payer: Self-pay | Admitting: Adult Health

## 2016-03-02 MED ORDER — ALBUTEROL SULFATE 2 MG PO TABS: 2.0000 mg | ORAL_TABLET | Freq: Two times a day (BID) | ORAL | Status: DC

## 2016-03-02 NOTE — Telephone Encounter (Signed)
Spoke with the pt  She is needing refill on her albuterol tablets  Rx was sent  She states that she was only given rx for 90 tablets of pred  She states that she normally gets 200 tablets  I see that we did send 200 tablets  She will check with the pharm about why she only got 90  Nothing further needed

## 2016-03-04 ENCOUNTER — Ambulatory Visit: Payer: Self-pay | Admitting: Internal Medicine

## 2016-03-15 ENCOUNTER — Telehealth: Payer: Self-pay | Admitting: Internal Medicine

## 2016-03-15 MED ORDER — PREDNISONE 10 MG PO TABS: ORAL_TABLET | ORAL | Status: DC

## 2016-03-15 MED ORDER — ALBUTEROL SULFATE 2 MG PO TABS: 2.0000 mg | ORAL_TABLET | Freq: Two times a day (BID) | ORAL | Status: DC

## 2016-03-15 NOTE — Telephone Encounter (Signed)
Called and spoke to pt. Informed her the rx has been sent to preferred pharmacy. Pt verbalized understanding and denied any further questions or concerns at this time.

## 2016-03-15 NOTE — Telephone Encounter (Signed)
Spoke with pt. She is calling about her prednisone prescription. States that her pharmacy will not fill the rx for #200. The pharmacy is saying it's because of the sig >> it needs to be written as a taper then as directed. So the rx will need to be written 4 tabs for 4 days, 3 for 3 days, 2 for 3 days 1 for 3 days then as directed.  CY - please advise if we can fill rx like this. Thanks.

## 2016-03-15 NOTE — Telephone Encounter (Signed)
Ok as you request

## 2016-04-12 ENCOUNTER — Ambulatory Visit: Payer: Self-pay | Admitting: Family

## 2016-04-12 ENCOUNTER — Ambulatory Visit (INDEPENDENT_AMBULATORY_CARE_PROVIDER_SITE_OTHER): Payer: BLUE CROSS/BLUE SHIELD | Admitting: Family

## 2016-04-12 ENCOUNTER — Encounter: Payer: Self-pay | Admitting: Family

## 2016-04-12 VITALS — BP 130/84 | HR 68 | Temp 98.4°F | Resp 16 | Ht 67.0 in | Wt 204.2 lb

## 2016-04-12 DIAGNOSIS — J4520 Mild intermittent asthma, uncomplicated: Secondary | ICD-10-CM

## 2016-04-12 DIAGNOSIS — I10 Essential (primary) hypertension: Secondary | ICD-10-CM

## 2016-04-12 DIAGNOSIS — R739 Hyperglycemia, unspecified: Secondary | ICD-10-CM

## 2016-04-12 DIAGNOSIS — R7303 Prediabetes: Secondary | ICD-10-CM | POA: Diagnosis not present

## 2016-04-12 DIAGNOSIS — J449 Chronic obstructive pulmonary disease, unspecified: Secondary | ICD-10-CM | POA: Insufficient documentation

## 2016-04-12 DIAGNOSIS — R600 Localized edema: Secondary | ICD-10-CM

## 2016-04-12 DIAGNOSIS — R609 Edema, unspecified: Secondary | ICD-10-CM

## 2016-04-12 LAB — BASIC METABOLIC PANEL
BUN: 11 mg/dL (ref 6–23)
CHLORIDE: 105 meq/L (ref 96–112)
CO2: 31 mEq/L (ref 19–32)
Calcium: 9 mg/dL (ref 8.4–10.5)
Creatinine, Ser: 0.7 mg/dL (ref 0.40–1.20)
GFR: 114.66 mL/min (ref >60.00)
GLUCOSE: 84 mg/dL (ref 70–99)
POTASSIUM: 4.1 meq/L (ref 3.5–5.1)
Sodium: 140 mEq/L (ref 135–145)

## 2016-04-12 LAB — HEMOGLOBIN A1C: Hgb A1c MFr Bld: 6.5 % (ref 4.6–6.5)

## 2016-04-12 NOTE — Patient Instructions (Signed)
Please complete lab work prior to leaving. Schedule a complete physical at the front desk.  

## 2016-04-12 NOTE — Assessment & Plan Note (Signed)
BP higher than usual today. Will plan to recheck at upcoming cpx.  Continue lasix.

## 2016-04-12 NOTE — Progress Notes (Signed)
Pre visit review using our clinic review tool, if applicable. No additional management support is needed unless otherwise documented below in the visit note. 

## 2016-04-12 NOTE — Assessment & Plan Note (Signed)
Likely exacerbated by chronic prednisone use. Continue lasix.

## 2016-04-12 NOTE — Assessment & Plan Note (Signed)
Obtain A1c.  

## 2016-04-12 NOTE — Progress Notes (Signed)
Subjective:    Patient ID: [name], female    DOB: [birth year], 50 y.o.   MRN: [id number]  HPI  [name] is a 50 yr old female who presets today for follow up.  1) HTN- maintained on lasix.  Denies CP or SOB.  Reports that she is consistent with potassium supplement BP Readings from Last 3 Encounters:  04/12/16 140/90  03/01/16 118/86  09/04/15 110/72   2) Asthma- maintained on breo, albuterol prn, prednisone 10mg  once daily.  Notes that with the weather she has had some flare of her asthma. Reports stable today. Uses inhalers regularly.    3) leg Swelling- reports gradual increase in LE edema x 1 year.  L leg > R leg. She is maintained on prednisone per Dr. Annamaria Boots for her Asthma. Denies calf pain, or recent travel.  4) Hyperglycemia-  Lab Results  Component Value Date   HGBA1C 6.0 10/20/2015    Review of Systems    see HPI  Past Medical History  Diagnosis Date  . Asthma   . Allergic rhinitis, cause unspecified   . Unspecified essential hypertension   . History of DVT (deep vein thrombosis) 2005    Pt reports blood clot below knee ?unsure which leg.  . Nasal polyps      Social History   Social History  . Marital Status: Married    Spouse Name: N/A  . Number of Children: N/A  . Years of Education: N/A   Occupational History  . Manager [employer]    Social History Main Topics  . Smoking status: Never Smoker   . Smokeless tobacco: Never Used     Comment: Never Used Tobacco  . Alcohol Use: 0.0 oz/week    0 Standard drinks or equivalent per week     Comment: 1 glass of wine per year  . Drug Use: Not on file  . Sexual Activity: Not on file   Other Topics Concern  . 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      Past Surgical History  Procedure Laterality Date  . Uterine fibroid  surgery  2005  . Nasal sinus surgery  [id number]    Dr Erik Obey  . Lasik Bilateral 2008    Family History  Problem Relation Age of Onset  . Cancer Mother 30    ?carcinoid tumor (had surgical removal)  . Diabetes Mother     borderline  . Heart disease Father     died 59, from MI, had hx of ETOH abuse and liver disease  . Kidney disease Neg Hx   . Hypertension Neg Hx   . Hyperlipidemia Neg Hx     Allergies  Allergen Reactions  . Aspirin     wheezing  . Penicillins Hives    Current Outpatient Prescriptions on File Prior to Visit  Medication Sig Dispense Refill  . albuterol (PROVENTIL) (2.5 MG/3ML) 0.083% nebulizer solution Use 1 vial in nebulizer as needed dx:496 150 mL PRN  . albuterol (PROVENTIL) 2 MG tablet Take 1 tablet (2 mg total) by mouth 2 (two) times daily. 60 tablet 5  . Albuterol Sulfate (PROAIR RESPICLICK) 123XX123 (90 Base) MCG/ACT AEPB Inhale 2 puffs into the lungs 4 (four) times daily as needed. 1 each 2  . fluticasone furoate-vilanterol (  BREO ELLIPTA) 100-25 MCG/INH AEPB INHALE ONE PUFF BY MOUTH ONCE DAILY 60 each 2  . furosemide (LASIX) 20 MG tablet Take 1 tablet (20 mg total) by mouth daily. 90 tablet 0  . ipratropium (ATROVENT) 0.02 % nebulizer solution Use 1 vial in nebulizer as needed dx. 496 150 mL PRN  . potassium chloride 20 MEQ/15ML (10%) SOLN Take 15 mLs (20 mEq total) by mouth daily. 1350 mL 0  . predniSONE (DELTASONE) 10 MG tablet 4 tabs for 4 days, 3 for 3 days, 2 for 3 days 1 for 3 days then as directed 200 tablet 5  . Prenatal Vit-Fe Sulfate-FA (PRENATAL VITAMIN PO) Take 1 capsule by mouth daily.    [name] triamcinolone (NASACORT) 55 MCG/ACT nasal inhaler Place 2 sprays into the nose daily. Reported on 03/01/2016     No current facility-administered medications on file prior to visit.    Pulse 68  Temp(Src) 98.4 F (36.9 C) (Oral)  Resp 16  Ht 5\' 7"  (1.702 m)  Wt 204 lb 3.2 oz (92.625 kg)  BMI 31.97 kg/m2  SpO2 98%  LMP 04/12/2016    Objective:    Physical Exam  Constitutional: She is oriented to person, place, and time. She appears well-developed and well-nourished.  HENT:  Head: Normocephalic and atraumatic.  Cardiovascular: Normal rate, regular rhythm and normal heart sounds.   No murmur heard. Pulmonary/Chest: Effort normal and breath sounds normal. No respiratory distress. She has no wheezes.  Musculoskeletal:  Trace bilateral LE edema noted  Neurological: She is alert and oriented to person, place, and time.  Psychiatric: She has a normal mood and affect. Her behavior is normal. Judgment and thought content normal.          Assessment & Plan:

## 2016-04-12 NOTE — Assessment & Plan Note (Signed)
Maintained on prednisone, breo per pulmonology.

## 2016-04-13 ENCOUNTER — Encounter: Payer: Self-pay | Admitting: Family

## 2016-05-12 ENCOUNTER — Other Ambulatory Visit: Payer: Self-pay | Admitting: Internal Medicine

## 2016-05-25 DIAGNOSIS — D259 Leiomyoma of uterus, unspecified: Secondary | ICD-10-CM | POA: Insufficient documentation

## 2016-06-25 ENCOUNTER — Telehealth: Payer: Self-pay | Admitting: Family

## 2016-06-25 ENCOUNTER — Telehealth: Payer: Self-pay | Admitting: Internal Medicine

## 2016-06-25 MED ORDER — AZITHROMYCIN 250 MG PO TABS: ORAL_TABLET | ORAL | Status: AC

## 2016-06-25 NOTE — Telephone Encounter (Signed)
Spoke with pt. She is aware of CY's recommendations. Rx has been sent in. Nothing further was needed. 

## 2016-06-25 NOTE — Telephone Encounter (Signed)
Offer Z pak.  Since this may be viral, suggest first trying an otc antihistamine like claritin, or a cough and cold remedy like Tylenol cold and sinus or one of those.

## 2016-06-25 NOTE — Telephone Encounter (Signed)
Spoke with pt. States that she has a runny nose for 3-4 days. Mucus is yellow. Denies chest tightness, wheezing, coughing or SOB. Has not used any OTC meds. Would like an antibiotic.  Allergies  Allergen Reactions  . Aspirin     wheezing  . Penicillins Hives   Current Outpatient Prescriptions on File Prior to Visit  Medication Sig Dispense Refill  . albuterol (PROVENTIL) (2.5 MG/3ML) 0.083% nebulizer solution Use 1 vial in nebulizer as needed dx:496 150 mL PRN  . albuterol (PROVENTIL) 2 MG tablet TAKE ONE TABLET BY MOUTH TWICE DAILY (NEEDS  OFFICE  VISIT  FOR  ADDITIONAL  REFILLS) 60 tablet 0  . Albuterol Sulfate (PROAIR RESPICLICK) 123XX123 (90 Base) MCG/ACT AEPB Inhale 2 puffs into the lungs 4 (four) times daily as needed. 1 each 2  . cetirizine (ZYRTEC) 10 MG tablet Take 10 mg by mouth daily.    . fluticasone furoate-vilanterol (BREO ELLIPTA) 100-25 MCG/INH AEPB INHALE ONE PUFF BY MOUTH ONCE DAILY 60 each 2  . furosemide (LASIX) 20 MG tablet Take 1 tablet (20 mg total) by mouth daily. 90 tablet 0  . ipratropium (ATROVENT) 0.02 % nebulizer solution Use 1 vial in nebulizer as needed dx. 496 150 mL PRN  . potassium chloride 20 MEQ/15ML (10%) SOLN Take 15 mLs (20 mEq total) by mouth daily. 1350 mL 0  . predniSONE (DELTASONE) 10 MG tablet 4 tabs for 4 days, 3 for 3 days, 2 for 3 days 1 for 3 days then as directed 200 tablet 5  . Prenatal Vit-Fe Sulfate-FA (PRENATAL VITAMIN PO) Take 1 capsule by mouth daily.    [name] triamcinolone (NASACORT) 55 MCG/ACT nasal inhaler Place 2 sprays into the nose daily. Reported on 03/01/2016     No current facility-administered medications on file prior to visit.    CY - please advise. Thanks.

## 2016-06-25 NOTE — Telephone Encounter (Signed)
Pt says that she is going to be traveling. She says that she is taking liquid potassium which is more then what she can take on the plane. Pt would like to know what options do she have because she need to take her potassium with her.   Please advise.    CB: [id number]

## 2016-06-28 MED ORDER — POTASSIUM CHLORIDE CRYS ER 20 MEQ PO TBCR: 20.0000 meq | EXTENDED_RELEASE_TABLET | Freq: Every day | ORAL | Status: DC

## 2016-06-28 NOTE — Telephone Encounter (Signed)
I looked up the flight rules- please advise patient as below:  Medication in liquid form is allowed in carry-on bags in excess of 3.4 ounces in reasonable quantities for the flight. It is not necessary to place medically required liquids in a zip-top bag. However, you must tell the officer that you have medically necessary liquids at the start of the screening checkpoint process

## 2016-06-28 NOTE — Telephone Encounter (Signed)
Called and spoke with the pt and informed her of the note below.  Pt verbalized understanding.  Pt stated that the medication bottle is about 8 ounces and she did not want to pour the medication into another container.   Pt is want to know if she could just get the Potassium in pill form for her vacation,and when she gets back she can restart the liquid.  Please advise.//AB/CMA

## 2016-06-28 NOTE — Telephone Encounter (Signed)
Sure, I sent rx for pills.

## 2016-06-29 NOTE — Telephone Encounter (Signed)
Called and spoke with the pt and informed her of the note below.  Pt verbalized understanding.//AB/CMA 

## 2016-07-09 ENCOUNTER — Telehealth: Payer: Self-pay | Admitting: Family

## 2016-07-09 NOTE — Telephone Encounter (Signed)
Relation to [id number] Call back [place]:  Reason for call:  Patient in ned of clinical advice regarding swollen ankle and legs. Please advise

## 2016-07-09 NOTE — Telephone Encounter (Signed)
Pt returned call. She has h/o of peripheral edema d/t chronic steroid use. Reports she has been on vacation w/ her family this week and the swelling has worsened from baseline. Per her report, endorses dependent edema in bilateral lower extremities, pain on the tops of her feet and intermittent red splotches on bilateral legs, both of which resolve as swelling dissipates. Edema is relieved w/ rest and elevation. Denies numbness and tingling of legs, feet, and toes and denies calf pain or redness/warmth, except as noted prior. She reports compliance w/ daily lasix 20 mg, although per last fill date, she should be out of medication if taking as directed.  Pt requested OV on Wednesday w/ Melissa. An earlier appt was offered, but declined due to schedule constraints, so Wednesday appt scheduled as requested. Advised at her request that she could consider compression stockings at work to help with swelling. 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 Pt advised that if symptoms worsen or if she develops calf pain, redness, or swelling that does not resolve to rest to call office for sooner appointment. Pt agreed to plan.

## 2016-07-09 NOTE — Telephone Encounter (Signed)
Called patient and left message to return call

## 2016-07-14 ENCOUNTER — Ambulatory Visit (HOSPITAL_BASED_OUTPATIENT_CLINIC_OR_DEPARTMENT_OTHER)
Admission: RE | Admit: 2016-07-14 | Discharge: 2016-07-14 | Disposition: A | Discharge status: Home / Self Care | Payer: BLUE CROSS/BLUE SHIELD | Source: Ambulatory Visit | Attending: Family | Admitting: Family

## 2016-07-14 ENCOUNTER — Encounter: Payer: Self-pay | Admitting: Family

## 2016-07-14 ENCOUNTER — Ambulatory Visit (INDEPENDENT_AMBULATORY_CARE_PROVIDER_SITE_OTHER): Payer: BLUE CROSS/BLUE SHIELD | Admitting: Family

## 2016-07-14 VITALS — BP 130/86 | HR 96 | Temp 98.5°F | Resp 16 | Ht 67.0 in | Wt 208.4 lb

## 2016-07-14 DIAGNOSIS — I10 Essential (primary) hypertension: Secondary | ICD-10-CM | POA: Insufficient documentation

## 2016-07-14 DIAGNOSIS — I868 Varicose veins of other specified sites: Secondary | ICD-10-CM

## 2016-07-14 DIAGNOSIS — I313 Pericardial effusion (noninflammatory): Secondary | ICD-10-CM | POA: Insufficient documentation

## 2016-07-14 DIAGNOSIS — I8390 Asymptomatic varicose veins of unspecified lower extremity: Secondary | ICD-10-CM

## 2016-07-14 DIAGNOSIS — R609 Edema, unspecified: Secondary | ICD-10-CM | POA: Diagnosis not present

## 2016-07-14 LAB — ECHOCARDIOGRAM COMPLETE
Height: 67 in
Weight: 3334.4 oz

## 2016-07-14 LAB — BASIC METABOLIC PANEL
BUN: 12 mg/dL (ref 6–23)
CALCIUM: 9.3 mg/dL (ref 8.4–10.5)
CO2: 27 mEq/L (ref 19–32)
CREATININE: 0.85 mg/dL (ref 0.40–1.20)
Chloride: 103 mEq/L (ref 96–112)
GFR: 91.54 mL/min (ref >60.00)
GLUCOSE: 123 mg/dL — AB (ref 70–99)
Potassium: 3.5 mEq/L (ref 3.5–5.1)
SODIUM: 138 meq/L (ref 135–145)

## 2016-07-14 MED ORDER — HYDROCORTISONE VALERATE 0.2 % EX OINT: 1.0000 "application " | TOPICAL_OINTMENT | Freq: Two times a day (BID) | CUTANEOUS | 0 refills | Status: DC

## 2016-07-14 MED ORDER — FUROSEMIDE 20 MG PO TABS: 20.0000 mg | ORAL_TABLET | Freq: Every day | ORAL | 0 refills | Status: DC

## 2016-07-14 NOTE — Patient Instructions (Signed)
Complete blood work prior to leaving. Complete ultrasound of the legs on the first floor today. You will be contacted about scheduling the US of the heart. Increase furosemide 20mg  to one tab twice daily for 3 days, then return to one tab once daily. Elevate leg as able. Wear support stockings as able. Limit sodium intake.

## 2016-07-14 NOTE — Progress Notes (Signed)
  Echocardiogram 2D Echocardiogram has been performed.  Jennette Dubin 07/14/2016, 2:44 PM

## 2016-07-14 NOTE — Progress Notes (Signed)
Pre visit review using our clinic review tool, if applicable. No additional management support is needed unless otherwise documented below in the visit note. 

## 2016-07-14 NOTE — Progress Notes (Signed)
Subjective:    Patient ID: [name], female    DOB: [birth year], 50 y.o.   MRN: [id number]  Chief Complaint  Patient presents with  . Foot Swelling    Pt reports swelling of feet and legs worsening since last week (1 1/2 wks).     HPI Patient is in today for LE edema present x 1.5 weeks.   Went to [employer] returned last Thursday.  As soon as she stands her feet begin to swell.  She is continued on prednisone 10mg  once daily.   BP Readings from Last 3 Encounters:  07/14/16 130/86  04/12/16 130/84  03/01/16 118/86     Mild rash on neck.  Requests refills on steroid cream.   Past Medical History:  Diagnosis Date  . Allergic rhinitis, cause unspecified   . Asthma   . History of DVT (deep vein thrombosis) 2005   Pt reports blood clot below knee ?unsure which leg.  . Nasal polyps   . Unspecified essential hypertension     Past Surgical History:  Procedure Laterality Date  . LASIK Bilateral 2008  . NASAL SINUS SURGERY  [id number]   Dr Erik Obey  . UTERINE FIBROID SURGERY  2005    Family History  Problem Relation Age of Onset  . Cancer Mother 85    ?carcinoid tumor (had surgical removal)  . Diabetes Mother     borderline  . Heart disease Father     died 58, from MI, had hx of ETOH abuse and liver disease  . Kidney disease Neg Hx   . Hypertension Neg Hx   . Hyperlipidemia Neg Hx     Social History   Social History  . Marital status: Married    Spouse name: N/A  . Number of children: N/A  . Years of education: N/A   Occupational History  . Manager [employer]    Social History Main Topics  . Smoking status: Never Smoker  . Smokeless tobacco: Never Used     Comment: Never Used Tobacco  . Alcohol use 0.0 oz/week     Comment: 1 glass of wine per year  . Drug use: Unknown  . Sexual activity: Not on file   Other Topics Concern  . Not on file   Social History Narrative   Married   No children   No pets   Works at [employer] as a Naval architect, shopping, 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      Outpatient Medications Prior to Visit  Medication Sig Dispense Refill  . albuterol (PROVENTIL) (2.5 MG/3ML) 0.083% nebulizer solution Use 1 vial in nebulizer as needed dx:496 150 mL PRN  . albuterol (PROVENTIL) 2 MG tablet TAKE ONE TABLET BY MOUTH TWICE DAILY (NEEDS  OFFICE  VISIT  FOR  ADDITIONAL  REFILLS) 60 tablet 0  . Albuterol Sulfate (PROAIR RESPICLICK) 123XX123 (90 Base) MCG/ACT AEPB Inhale 2 puffs into the lungs 4 (four) times daily as needed. 1 each 2  . cetirizine (ZYRTEC) 10 MG tablet Take 10 mg by mouth daily.    . fluticasone furoate-vilanterol (BREO ELLIPTA) 100-25 MCG/INH AEPB INHALE ONE PUFF BY MOUTH ONCE DAILY 60 each 2  . furosemide (LASIX) 20 MG tablet Take 1 tablet (20 mg total) by mouth daily. 90 tablet 0  . ipratropium (ATROVENT) 0.02 % nebulizer solution Use 1 vial in nebulizer as needed dx. 496 150 mL PRN  . potassium chloride 20 MEQ/15ML (10%)  SOLN Take 15 mLs (20 mEq total) by mouth daily. 1350 mL 0  . potassium chloride SA (K-DUR,KLOR-CON) 20 MEQ tablet Take 1 tablet (20 mEq total) by mouth daily. 14 tablet 0  . predniSONE (DELTASONE) 10 MG tablet 4 tabs for 4 days, 3 for 3 days, 2 for 3 days 1 for 3 days then as directed 200 tablet 5  . Prenatal Vit-Fe Sulfate-FA (PRENATAL VITAMIN PO) Take 1 capsule by mouth daily.    [name] triamcinolone (NASACORT) 55 MCG/ACT nasal inhaler Place 2 sprays into the nose daily. Reported on 03/01/2016     No facility-administered medications prior to visit.     Allergies  Allergen Reactions  . Aspirin     wheezing  . Penicillins Hives    ROS    see HPI Objective:    Physical Exam  Constitutional: She is oriented to person, place, and time. She appears well-developed and well-nourished. No distress.  HENT:  Head: Normocephalic and atraumatic.  Cardiovascular: Normal rate and regular rhythm.   No murmur heard. LE varicose/spider veins noted.    Pulmonary/Chest: Effort normal and breath sounds normal. No respiratory distress. She has no wheezes. She has no rales. She exhibits no tenderness.  Musculoskeletal:  3+ left pedal edema 2+ R pedal edema  Neurological: She is alert and oriented to person, place, and time.  Psychiatric: Her behavior is normal. Judgment and thought content normal.  tearful    BP 130/86   Temp 98.5 F (36.9 C) (Oral)   Ht 5\' 7"  (1.702 m)   Wt 208 lb 6.4 oz (94.5 kg)   LMP 04/19/2016   BMI 32.64 kg/m  Wt Readings from Last 3 Encounters:  07/14/16 208 lb 6.4 oz (94.5 kg)  04/12/16 204 lb 3.2 oz (92.6 kg)  03/01/16 203 lb (92.1 kg)     Lab Results  Component Value Date   WBC 10.3 10/20/2015   HGB 12.7 10/20/2015   HCT 39.1 10/20/2015   PLT 309.0 10/20/2015   GLUCOSE 84 04/12/2016   CHOL 187 02/17/2015   TRIG 88.0 02/17/2015   HDL 58.10 02/17/2015   LDLCALC 111 (H) 02/17/2015   ALT 20 02/17/2015   AST 19 02/17/2015   NA 140 04/12/2016   K 4.1 04/12/2016   CL 105 04/12/2016   CREATININE 0.70 04/12/2016   BUN 11 04/12/2016   CO2 31 04/12/2016   TSH 1.05 02/17/2015   INR 1.4 10/23/2007   HGBA1C 6.5 04/12/2016    Lab Results  Component Value Date   TSH 1.05 02/17/2015   Lab Results  Component Value Date   WBC 10.3 10/20/2015   HGB 12.7 10/20/2015   HCT 39.1 10/20/2015   MCV 80.9 10/20/2015   PLT 309.0 10/20/2015   Lab Results  Component Value Date   NA 140 04/12/2016   K 4.1 04/12/2016   CO2 31 04/12/2016   GLUCOSE 84 04/12/2016   BUN 11 04/12/2016   CREATININE 0.70 04/12/2016   BILITOT 0.4 02/17/2015   ALKPHOS 76 02/17/2015   AST 19 02/17/2015   ALT 20 02/17/2015   PROT 6.5 02/17/2015   ALBUMIN 3.5 02/17/2015   CALCIUM 9.0 04/12/2016   GFR 114.66 04/12/2016   Lab Results  Component Value Date   CHOL 187 02/17/2015   Lab Results  Component Value Date   HDL 58.10 02/17/2015   Lab Results  Component Value Date   LDLCALC 111 (H) 02/17/2015   Lab Results    Component Value Date   TRIG  88.0 02/17/2015   Lab Results  Component Value Date   CHOLHDL 3 02/17/2015   Lab Results  Component Value Date   HGBA1C 6.5 04/12/2016       Assessment & Plan:  Edema- Bilateral LE dopplers negative, 2D echo WNL. She is maintained on once daily lasix. Advised pt to increase to bid x 3 days then return to once daily.  Suspect that her edema is due to venous insufficiency and chronic prednisone use. Discussed use of support hose, elevation, avoiding salt.  Varicose veins- will refer for vascular consult at pt request.    Nance Pear., NP

## 2016-07-15 ENCOUNTER — Encounter: Payer: Self-pay | Admitting: Family

## 2016-07-29 ENCOUNTER — Other Ambulatory Visit: Payer: Self-pay | Admitting: Vascular Surgery

## 2016-07-29 DIAGNOSIS — R600 Localized edema: Secondary | ICD-10-CM

## 2016-08-06 ENCOUNTER — Encounter: Payer: Self-pay | Admitting: Vascular Surgery

## 2016-08-10 ENCOUNTER — Telehealth: Payer: Self-pay | Admitting: *Deleted

## 2016-08-10 NOTE — Telephone Encounter (Signed)
CMM breo PA Key: [id number]  [id number] Name:FCR: Non-Preferred and Formulary Exclusion Copay Tier Exception Generic, Branded *[employer]*;Status:Approved;Coverage Start Date:07/20/2016;Coverage End Date:08/10/2017  I called [facility] and made aware of approval. Nothing further needed

## 2016-08-11 ENCOUNTER — Telehealth: Payer: Self-pay | Admitting: Family

## 2016-08-11 ENCOUNTER — Telehealth: Payer: Self-pay | Admitting: Internal Medicine

## 2016-08-11 MED ORDER — PREDNISONE 10 MG PO TABS: ORAL_TABLET | ORAL | 0 refills | Status: DC

## 2016-08-11 MED ORDER — FLUTICASONE FUROATE-VILANTEROL 100-25 MCG/INH IN AEPB: 1.0000 | INHALATION_SPRAY | Freq: Every day | RESPIRATORY_TRACT | 0 refills | Status: DC

## 2016-08-11 NOTE — Telephone Encounter (Signed)
Pt called back and she stated that she is having chest tightness x 4-5 days.  She is having dry cough, nasal congestion that is yellow/green.  Pt stated that she feels that her asthma is flaring due to this.  Pt is wanting to see what other recs CY may have.  Please advise. Thanks   Allergies  Allergen Reactions  . Aspirin     wheezing  . Penicillins Hives    Last ov--03/01/16 Next ov--09/02/16

## 2016-08-11 NOTE — Telephone Encounter (Signed)
Relation to [id number] Call back [place]:  Reason for call:  Patient was last seen 07/14/2016 and would like to speak with the nurse regarding Edema / Foot Swelling and advise regarding the medication prescribed. Patient stated some days are better then others regarding swollen feet. Please advise

## 2016-08-11 NOTE — Telephone Encounter (Signed)
lmomtcb x1 for pt 

## 2016-08-11 NOTE — Telephone Encounter (Signed)
If she is doing okay, then simply schedule a future follow-up for a routine visit with PCP in 3 to 4 months. Continue with leg elevation, low-salt intake and same medications.

## 2016-08-11 NOTE — Telephone Encounter (Signed)
Called patient, she reports she has no swelling currently, but is still experiencing dependent edema when she is on her feet for long hours at work. Edema resolves w/ rest and elevation. No new symptoms. She is wearing support stocking as able as advised by PCP and has limited sodium intake. She would like to know when she should follow-up in office. Please advise. (Per AVS, due for follow-up 07/21/16, but since pt missed this date and is not having new symptoms, she wanted to know if she still needed to come in.)

## 2016-08-11 NOTE — Telephone Encounter (Signed)
Pt aware of recs, rx called in to pharmacy.  Pt also requesting breo samples.  These have been left up front.   Nothing further needed.

## 2016-08-11 NOTE — Telephone Encounter (Signed)
Usually her prednisone responds to prednisone and usually she does this on her own. Recommend prednisone 10 mg, # 20    4 X 2 DAYS, 3 X 2 DAYS, 2 X 2 DAYS, 1 X 2 DAYS

## 2016-08-12 NOTE — Telephone Encounter (Signed)
Patient notified and verbalized understanding. She has CPE scheduled in October and will follow-up at that time, sooner if any new or worsening symptoms.

## 2016-09-02 ENCOUNTER — Ambulatory Visit: Payer: Self-pay | Admitting: Internal Medicine

## 2016-09-10 ENCOUNTER — Encounter: Payer: Self-pay | Admitting: Vascular Surgery

## 2016-09-16 ENCOUNTER — Ambulatory Visit (INDEPENDENT_AMBULATORY_CARE_PROVIDER_SITE_OTHER): Payer: BLUE CROSS/BLUE SHIELD | Admitting: Vascular Surgery

## 2016-09-16 ENCOUNTER — Encounter: Payer: Self-pay | Admitting: Vascular Surgery

## 2016-09-16 ENCOUNTER — Ambulatory Visit (HOSPITAL_COMMUNITY)
Admission: RE | Admit: 2016-09-16 | Discharge: 2016-09-16 | Disposition: A | Discharge status: Home / Self Care | Payer: BLUE CROSS/BLUE SHIELD | Source: Ambulatory Visit | Attending: Vascular Surgery | Admitting: Vascular Surgery

## 2016-09-16 VITALS — BP 124/91 | HR 81 | Temp 98.3°F | Resp 16 | Ht 67.0 in | Wt 201.6 lb

## 2016-09-16 DIAGNOSIS — R609 Edema, unspecified: Secondary | ICD-10-CM

## 2016-09-16 DIAGNOSIS — R600 Localized edema: Secondary | ICD-10-CM | POA: Diagnosis not present

## 2016-09-16 DIAGNOSIS — I872 Venous insufficiency (chronic) (peripheral): Secondary | ICD-10-CM

## 2016-09-16 DIAGNOSIS — I83893 Varicose veins of bilateral lower extremities with other complications: Secondary | ICD-10-CM

## 2016-09-16 NOTE — Progress Notes (Signed)
Referred by:  [name], NP [address], [place] [address]  Reason for referral: bilateral leg swelling   History of Present Illness  [name] is a 50 y.o. (03/24/1966) female who presents with chief complaint: bilateral calf swelling.  Patient notes, onset of swelling years ago with worsening over the last few months.  The patient's symptoms include: swelling which worsens with standing.  She denies pain or itching.  The patient has had prior history of DVT: possible left leg, no history of pregnancy, known history of varicose vein, no history of venous stasis ulcers, no history of  Lymphedema and no history of skin changes in lower legs.  There is known family history of venous disorders.  The patient has used knee high compression stockings in the past.   Past Medical History:  Diagnosis Date  . Allergic rhinitis, cause unspecified   . Asthma   . History of DVT (deep vein thrombosis) 2005   Pt reports blood clot below knee ?unsure which leg.  . Nasal polyps   . Unspecified essential hypertension     Past Surgical History:  Procedure Laterality Date  . LASIK Bilateral 2008  . NASAL SINUS SURGERY  [id number]   Dr Erik Obey  . UTERINE FIBROID SURGERY  2005    Social History   Social History  . Marital status: Married    Spouse name: N/A  . Number of children: N/A  . Years of education: N/A   Occupational History  . Manager [employer]    Social History Main Topics  . Smoking status: Never Smoker  . Smokeless tobacco: Never Used     Comment: Never Used Tobacco  . Alcohol use 0.0 oz/week     Comment: 1 glass of wine per year  . Drug use: Unknown  . Sexual activity: Not on file   Other Topics Concern  . 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       Family History    Problem Relation Age of Onset  . Cancer Mother 14    ?carcinoid tumor (had surgical removal)  . Diabetes Mother     borderline  . Heart disease Father     died 9, from MI, had hx of ETOH abuse and liver disease  . Kidney disease Neg Hx   . Hypertension Neg Hx   . Hyperlipidemia Neg Hx     Current Outpatient Prescriptions  Medication Sig Dispense Refill  . albuterol (PROVENTIL) (2.5 MG/3ML) 0.083% nebulizer solution Use 1 vial in nebulizer as needed dx:496 150 mL PRN  . albuterol (PROVENTIL) 2 MG tablet TAKE ONE TABLET BY MOUTH TWICE DAILY (NEEDS  OFFICE  VISIT  FOR  ADDITIONAL  REFILLS) 60 tablet 0  . Albuterol Sulfate (PROAIR RESPICLICK) 123XX123 (90 Base) MCG/ACT AEPB Inhale 2 puffs into the lungs 4 (four) times daily as needed. 1 each 2  . cetirizine (ZYRTEC) 10 MG tablet Take 10 mg by mouth daily.    . fluticasone furoate-vilanterol (BREO ELLIPTA) 100-25 MCG/INH AEPB INHALE ONE PUFF BY MOUTH ONCE DAILY 60 each 2  . fluticasone furoate-vilanterol (BREO ELLIPTA) 100-25 MCG/INH AEPB Inhale 1 puff into the lungs daily. 2 each 0  . furosemide (LASIX) 20 MG tablet Take 1 tablet (20  mg total) by mouth daily. 90 tablet 0  . hydrocortisone valerate ointment (WESTCORT) 0.2 % Apply 1 application topically 2 (two) times daily. 45 g 0  . ipratropium (ATROVENT) 0.02 % nebulizer solution Use 1 vial in nebulizer as needed dx. 496 150 mL PRN  . potassium chloride 20 MEQ/15ML (10%) SOLN Take 15 mLs (20 mEq total) by mouth daily. 1350 mL 0  . potassium chloride SA (K-DUR,KLOR-CON) 20 MEQ tablet Take 1 tablet (20 mEq total) by mouth daily. 14 tablet 0  . predniSONE (DELTASONE) 10 MG tablet 4 tabs for 4 days, 3 for 3 days, 2 for 3 days 1 for 3 days then as directed 200 tablet 5  . Prenatal Vit-Fe Sulfate-FA (PRENATAL VITAMIN PO) Take 1 capsule by mouth daily.    [name] triamcinolone (NASACORT) 55 MCG/ACT nasal inhaler Place 2 sprays into the nose daily. Reported on 03/01/2016     No current facility-administered  medications for this visit.     Allergies  Allergen Reactions  . Aspirin     wheezing  . Penicillins Hives     REVIEW OF SYSTEMS:   Cardiac:  positive for: no symptoms, negative for: Chest pain or chest pressure, Shortness of breath upon exertion and Shortness of breath when lying flat,   Vascular:  positive for: Leg swelling,  negative for: Pain in calf, thigh, or hip brought on by ambulation, Pain in feet at night that wakes you up from your sleep and Blood clot in your veins  Pulmonary:  positive for: Wheezing,  negative for: Oxygen at home and Productive cough  Neurologic:  positive for: Problems with dizziness, negative for: Sudden weakness in arms or legs, Sudden numbness in arms or legs, Sudden onset of difficulty speaking or slurred speech, Temporary loss of vision in one eye and Problems with dizziness  Gastrointestinal:  positive for: no symptoms, negative for: Blood in stool and Vomited blood  Genitourinary:  positive for: no symptoms, negative for: Burning when urinating and Blood in urine  Psychiatric:  positive for: no symptoms,  negative for: Major depression  Hematologic:  positive for: no symptoms,  negative for: negative for: Bleeding problems and Problems with blood clotting too easily  Dermatologic:  positive for: no symptoms, negative for: Rashes or ulcers  Constitutional:  positive for: no symptoms, negative for: Fever or chills   Physical Examination  Vitals:   09/16/16 1559  BP: (!) 124/91  Pulse: 81  Resp: 16  Temp: 98.3 F (36.8 C)  TempSrc: Oral  SpO2: 96%  Weight: 201 lb 9.6 oz (91.4 kg)  Height: 5\' 7"  (1.702 m)    Body mass index is 31.58 kg/m.  General: Alert, O x 3, WD,NAD  Head: [place]/AT,   Ear/Nose/Throat: Hearing grossly intact, nares without erythema or drainage, oropharynx without Erythema or Exudate , Mallampati score: 3, Dentition intact  Eyes: PERRLA, EOMI,   Neck: Supple, mid-line trachea,     Pulmonary: Sym exp, good B air movt,CTA B  Cardiac: RRR, Nl S1, S2, no Murmurs, No rubs, No S3,S4  Vascular: Vessel Right Left  Radial Palpable Palpable  Brachial Palpable Palpable  Carotid Palpable, No Bruit Palpable, No Bruit  Aorta Not palpable N/A  Femoral Palpable Palpable  Popliteal Not palpable Not palpable  PT Faintly palpable Faintly palpable  DP Faintly Faintly   Gastrointestinal: soft, non-distended, non-tender to palpation, No guarding or rebound, no HSM, no masses, no CVAT B, No palpable prominent aortic pulse due to pannus  Musculoskeletal: M/S 5/5 throughout  ,  Extremities without ischemic changes  , Edema in B legs: L calf fuller than R, Varicosities present: BLE thigh and calf, clusters behind both knees (R>L), No LDS present  Neurologic: CN 2-12 intact , Pain and light touch intact in extremities , Motor exam as listed above  Psychiatric: Judgement intact, Mood & affect appropriate for pt's clinical situation  Dermatologic: See M/S exam for extremity exam, No rashes otherwise noted  Lymph : Palpable lymph nodes: None   Non-Invasive Vascular Imaging  BLE Venous Insufficiency Duplex (Date: 09/16/2016):   RLE:   no DVT and SVT,   no GSV reflux,   no SSV reflux,  + deep venous reflux: CFV  LLE:  no DVT and SVT,   no GSV reflux,   no SSV reflux,  + deep venous reflux: CFV, FV   Outside Studies/Documentation 3 pages of outside documents were reviewed including: outpatient PCP chart.   Medical Decision Making  [name] is a 50 y.o. female who presents with: BLE chronic venous insufficiency (C3), varicose veins with complications   Based on the patient's history and examination, I recommend: compressive therapy.  I discussed with the patient the use of her 20-30 mm thigh high compression stockings.  Pt's anatomy is not compatible with a permanent intervention in the venous system as her anatomy is primarily deep venous  reflux.  Thank you for allowing [place] to participate in this patient's care.   Adele Barthel, MD [facility] Office: [phone] Pager: [phone]  09/16/2016, 4:24 PM

## 2016-10-01 ENCOUNTER — Encounter: Payer: Self-pay | Admitting: Vascular Surgery

## 2016-10-01 ENCOUNTER — Encounter (HOSPITAL_COMMUNITY): Payer: Self-pay

## 2016-10-12 ENCOUNTER — Ambulatory Visit (INDEPENDENT_AMBULATORY_CARE_PROVIDER_SITE_OTHER): Payer: BLUE CROSS/BLUE SHIELD | Admitting: Family

## 2016-10-12 ENCOUNTER — Encounter: Payer: Self-pay | Admitting: Family

## 2016-10-12 VITALS — BP 130/80 | HR 72 | Temp 98.2°F | Resp 18 | Ht 66.5 in | Wt 199.2 lb

## 2016-10-12 DIAGNOSIS — Z0000 Encounter for general adult medical examination without abnormal findings: Secondary | ICD-10-CM | POA: Diagnosis not present

## 2016-10-12 DIAGNOSIS — F429 Obsessive-compulsive disorder, unspecified: Secondary | ICD-10-CM | POA: Diagnosis not present

## 2016-10-12 LAB — CBC WITH DIFFERENTIAL/PLATELET
BASOS ABS: 0 10*3/uL (ref 0.0–0.1)
BASOS PCT: 0.3 % (ref 0.0–3.0)
EOS ABS: 0.2 10*3/uL (ref 0.0–0.7)
Eosinophils Relative: 2.3 % (ref 0.0–5.0)
HEMATOCRIT: 38.7 % (ref 36.0–46.0)
HEMOGLOBIN: 12.7 g/dL (ref 12.0–15.0)
LYMPHS PCT: 18.7 % (ref 12.0–46.0)
Lymphs Abs: 1.9 10*3/uL (ref 0.7–4.0)
MCHC: 32.8 g/dL (ref 30.0–36.0)
MCV: 75.1 fl — ABNORMAL LOW (ref 78.0–100.0)
MONO ABS: 0.9 10*3/uL (ref 0.1–1.0)
Monocytes Relative: 8.6 % (ref 3.0–12.0)
Neutro Abs: 7.3 10*3/uL (ref 1.4–7.7)
Neutrophils Relative %: 70.1 % (ref 43.0–77.0)
Platelets: 328 10*3/uL (ref 150.0–400.0)
RBC: 5.15 Mil/uL — ABNORMAL HIGH (ref 3.87–5.11)
RDW: 15.6 % — AB (ref 11.5–15.5)
WBC: 10.4 10*3/uL (ref 4.0–10.5)

## 2016-10-12 LAB — LIPID PANEL
CHOL/HDL RATIO: 4
Cholesterol: 182 mg/dL (ref 0–200)
HDL: 45.8 mg/dL (ref >39.00)
LDL CALC: 113 mg/dL — AB (ref 0–99)
NONHDL: 136.63
TRIGLYCERIDES: 116 mg/dL (ref 0.0–149.0)
VLDL: 23.2 mg/dL (ref 0.0–40.0)

## 2016-10-12 LAB — BASIC METABOLIC PANEL
BUN: 12 mg/dL (ref 6–23)
CALCIUM: 9.4 mg/dL (ref 8.4–10.5)
CO2: 27 mEq/L (ref 19–32)
CREATININE: 0.82 mg/dL (ref 0.40–1.20)
Chloride: 105 mEq/L (ref 96–112)
GFR: 95.32 mL/min (ref >60.00)
Glucose, Bld: 96 mg/dL (ref 70–99)
Potassium: 3.4 mEq/L — ABNORMAL LOW (ref 3.5–5.1)
Sodium: 140 mEq/L (ref 135–145)

## 2016-10-12 LAB — HEPATIC FUNCTION PANEL
ALK PHOS: 71 U/L (ref 39–117)
ALT: 17 U/L (ref 0–35)
AST: 20 U/L (ref 0–37)
Albumin: 3.8 g/dL (ref 3.5–5.2)
BILIRUBIN DIRECT: 0.1 mg/dL (ref 0.0–0.3)
BILIRUBIN TOTAL: 0.4 mg/dL (ref 0.2–1.2)
TOTAL PROTEIN: 6.8 g/dL (ref 6.0–8.3)

## 2016-10-12 LAB — URINALYSIS, ROUTINE W REFLEX MICROSCOPIC
Bilirubin Urine: NEGATIVE
KETONES UR: NEGATIVE
LEUKOCYTES UA: NEGATIVE
NITRITE: NEGATIVE
PH: 6 (ref 5.0–8.0)
TOTAL PROTEIN, URINE-UPE24: NEGATIVE
URINE GLUCOSE: NEGATIVE
UROBILINOGEN UA: 0.2 (ref 0.0–1.0)

## 2016-10-12 LAB — TSH: TSH: 0.49 u[IU]/mL (ref 0.35–4.50)

## 2016-10-12 MED ORDER — ESCITALOPRAM OXALATE 10 MG PO TABS: ORAL_TABLET | ORAL | 0 refills | Status: DC

## 2016-10-12 NOTE — Assessment & Plan Note (Signed)
Deteriorated. Trial of lexapro.  I instructed pt to start 1/2 tablet once daily for 1 week and then increase to a full tablet once daily on week two as tolerated.  We discussed common side effects such as nausea, drowsiness and weight gain.    Plan follow up in 1 month to evaluate progress.

## 2016-10-12 NOTE — Progress Notes (Signed)
Pre visit review using our clinic review tool, if applicable. No additional management support is needed unless otherwise documented below in the visit note. 

## 2016-10-12 NOTE — Patient Instructions (Signed)
Please begin lexapro 10mg  (for OCD) 1/2 tab once daily for 1 week, then increase to a full tab once daily on week two.  Complete lab work prior to leaving. Restart zyrtec (cetirizine) once daily.

## 2016-10-12 NOTE — Assessment & Plan Note (Signed)
Discussed healthy diet, exercise. Obtain routine lab work.   

## 2016-10-12 NOTE — Progress Notes (Signed)
Subjective:    Patient ID: [name], female    DOB: 1966-04-20, 50 y.o.   MRN: [id number]  HPI  Patient presents today for complete physical.  Immunizations: pneumovax up to date.  Flu shot up to date. Tetanus up to date Diet: improved.  Exercise: reports that she has cut back on her salt intake.  Pap Smear: 2/16- normal Mammogram: 3/17- normal per patient  Wt Readings from Last 3 Encounters:  10/12/16 199 lb 3.2 oz (90.4 kg)  09/16/16 201 lb 9.6 oz (91.4 kg)  07/14/16 208 lb 6.4 oz (94.5 kg)   Reports that she "double checks things" all the time.  Denies excessive cleaning.      Review of Systems  Constitutional: Negative for unexpected weight change.  HENT: Positive for rhinorrhea.        + rhinorrhea  Respiratory: Negative for cough.   Cardiovascular:       Reports resolution of LE edema  Gastrointestinal: Negative for constipation and diarrhea.  Genitourinary: Negative for dysuria and frequency.  Musculoskeletal: Negative for arthralgias and myalgias.  Skin: Negative for rash.  Neurological: Negative for headaches.  Hematological: Negative for adenopathy.  Psychiatric/Behavioral:       Denies depression/anxiety       Past Medical History:  Diagnosis Date  . Allergic rhinitis, cause unspecified   . Asthma   . History of DVT (deep vein thrombosis) 2005   Pt reports blood clot below knee ?unsure which leg.  . Nasal polyps   . Unspecified essential hypertension      Social History   Social History  . Marital status: Married    Spouse name: N/A  . Number of children: N/A  . Years of education: N/A   Occupational History  . Manager [employer]    Social History Main Topics  . Smoking status: Never Smoker  . Smokeless tobacco: Never Used     Comment: Never Used Tobacco  . Alcohol use 0.0 oz/week     Comment: 1 glass of wine per year  . Drug use: No  . Sexual activity: Yes    Birth control/ protection: None   Other Topics Concern  . 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      Past Surgical History:  Procedure Laterality Date  . LASIK Bilateral 2008  . NASAL SINUS SURGERY  [id number]   Dr Erik Obey  . OVARIAN CYST REMOVAL  02/2016   pt reported-- Dr Philis Pique  . UTERINE FIBROID SURGERY  2005    Family History  Problem Relation Age of Onset  . Cancer Mother 8    ?carcinoid tumor (had surgical removal)  . Diabetes Mother     borderline  . Heart disease Father     died 72, from MI, had hx of ETOH abuse and liver disease  . Thyroid cancer Sister     (diagnosed at 50) half sister (same dad)   . Diabetes type II Brother   . Kidney disease Neg Hx   . Hypertension Neg Hx   . Hyperlipidemia Neg Hx     Allergies  Allergen Reactions  . Aspirin     wheezing  . Penicillins Hives    Current Outpatient Prescriptions on File Prior to Visit  Medication Sig Dispense Refill  .  albuterol (PROVENTIL) (2.5 MG/3ML) 0.083% nebulizer solution Use 1 vial in nebulizer as needed dx:496 150 mL PRN  . albuterol (PROVENTIL) 2 MG tablet TAKE ONE TABLET BY MOUTH TWICE DAILY (NEEDS  OFFICE  VISIT  FOR  ADDITIONAL  REFILLS) 60 tablet 0  . Albuterol Sulfate (PROAIR RESPICLICK) 123XX123 (90 Base) MCG/ACT AEPB Inhale 2 puffs into the lungs 4 (four) times daily as needed. 1 each 2  . cetirizine (ZYRTEC) 10 MG tablet Take 10 mg by mouth daily.    . fluticasone furoate-vilanterol (BREO ELLIPTA) 100-25 MCG/INH AEPB INHALE ONE PUFF BY MOUTH ONCE DAILY 60 each 2  . fluticasone furoate-vilanterol (BREO ELLIPTA) 100-25 MCG/INH AEPB Inhale 1 puff into the lungs daily. 2 each 0  . furosemide (LASIX) 20 MG tablet Take 1 tablet (20 mg total) by mouth daily. 90 tablet 0  . hydrocortisone valerate ointment (WESTCORT) 0.2 % Apply 1 application topically 2 (two) times daily. 45 g 0  . ipratropium (ATROVENT)  0.02 % nebulizer solution Use 1 vial in nebulizer as needed dx. 496 150 mL PRN  . potassium chloride 20 MEQ/15ML (10%) SOLN Take 15 mLs (20 mEq total) by mouth daily. 1350 mL 0  . potassium chloride SA (K-DUR,KLOR-CON) 20 MEQ tablet Take 1 tablet (20 mEq total) by mouth daily. 14 tablet 0  . Prenatal Vit-Fe Sulfate-FA (PRENATAL VITAMIN PO) Take 1 capsule by mouth daily.    [name] triamcinolone (NASACORT) 55 MCG/ACT nasal inhaler Place 2 sprays into the nose daily. Reported on 03/01/2016     No current facility-administered medications on file prior to visit.     BP 130/80 (BP Location: Right Arm, Cuff Size: Large)   Pulse 72   Temp 98.2 F (36.8 C) (Oral)   Resp 18   Ht 5' 6.5" (1.689 m)   Wt 199 lb 3.2 oz (90.4 kg)   LMP 10/10/2016   SpO2 98% Comment: room air  BMI 31.67 kg/m    Objective:   Physical Exam Physical Exam  Constitutional: She is oriented to person, place, and time. She appears well-developed and well-nourished. No distress.  HENT:  Head: Normocephalic and atraumatic.  Right Ear: Tympanic membrane and ear canal normal.  Left Ear: Tympanic membrane and ear canal normal.  Mouth/Throat: Oropharynx is clear and moist.  Eyes: Pupils are equal, round, and reactive to light. No scleral icterus.  Neck: Normal range of motion. No thyromegaly present.  Cardiovascular: Normal rate and regular rhythm.   No murmur heard. Pulmonary/Chest: Effort normal and breath sounds normal. No respiratory distress. He has no wheezes. She has no rales. She exhibits no tenderness.  Abdominal: Soft. Bowel sounds are normal. She exhibits no distension and no mass. There is no tenderness. There is no rebound and no guarding.  Musculoskeletal: She exhibits no edema.  Lymphadenopathy:    She has no cervical adenopathy.  Neurological: She is alert and oriented to person, place, and time. She has normal patellar reflexes. She exhibits normal muscle tone. Coordination normal.  Skin: Skin is warm and dry.   Psychiatric: She has a normal mood and affect. Her behavior is normal. Judgment and thought content normal.         Assessment & Plan:          Assessment & Plan:

## 2016-10-13 ENCOUNTER — Telehealth: Payer: Self-pay | Admitting: Family

## 2016-10-13 DIAGNOSIS — E876 Hypokalemia: Secondary | ICD-10-CM

## 2016-10-13 MED ORDER — POTASSIUM CHLORIDE 20 MEQ/15ML (10%) PO SOLN: 20.0000 meq | Freq: Every day | ORAL | 1 refills | Status: DC

## 2016-10-13 NOTE — Telephone Encounter (Signed)
+   blood in urine. Did she have her period yesterday? Thyroid, cholesterol, liver function look good.  Potassium is low. Is she taking potassium every day?  Take 2 tabs today, then one tab once daily, repeat bmet in 1 week, dx hypokalemia.

## 2016-10-13 NOTE — Telephone Encounter (Signed)
Notified pt and she was on her cycle at last visit. States she has been taking potassium every day and has not been out. Pt states she was getting potassium liquid but last Rx was tablet and she has difficulty swallowing tablets. Sent refill for liquid potassium. Pt will repeat lab on 10/21/16 and future order has been entered.

## 2016-10-21 ENCOUNTER — Other Ambulatory Visit (INDEPENDENT_AMBULATORY_CARE_PROVIDER_SITE_OTHER): Payer: BLUE CROSS/BLUE SHIELD

## 2016-10-21 DIAGNOSIS — E876 Hypokalemia: Secondary | ICD-10-CM

## 2016-10-21 LAB — BASIC METABOLIC PANEL
BUN: 12 mg/dL (ref 6–23)
CALCIUM: 9.4 mg/dL (ref 8.4–10.5)
CHLORIDE: 104 meq/L (ref 96–112)
CO2: 28 mEq/L (ref 19–32)
CREATININE: 0.81 mg/dL (ref 0.40–1.20)
GFR: 96.67 mL/min (ref >60.00)
Glucose, Bld: 91 mg/dL (ref 70–99)
Potassium: 3.7 mEq/L (ref 3.5–5.1)
Sodium: 140 mEq/L (ref 135–145)

## 2016-10-26 ENCOUNTER — Encounter: Payer: Self-pay | Admitting: Family

## 2016-10-29 NOTE — Telephone Encounter (Signed)
Samples for Breo 100 were placed up front to pick up 08/11/16. These were never picked up.  Samples have been logged back in and put back on the shelf. Nothing further needed.

## 2016-11-05 ENCOUNTER — Other Ambulatory Visit: Payer: Self-pay | Admitting: Internal Medicine

## 2016-11-13 ENCOUNTER — Ambulatory Visit (HOSPITAL_COMMUNITY)
Admission: EM | Admit: 2016-11-13 | Discharge: 2016-11-13 | Disposition: A | Discharge status: Home / Self Care | Payer: BLUE CROSS/BLUE SHIELD

## 2016-11-15 ENCOUNTER — Telehealth: Payer: Self-pay | Admitting: *Deleted

## 2016-11-15 NOTE — Telephone Encounter (Signed)
[facility] note received via fax  Call:   Date: 11/13/16 Time: 1811   Caller: Self Return number: [phone]  Nurse: Starleen Blue, RN  Chief Complaint: Eye Redness  Reason for call: Caller states her eyes are red and itchy  Related visit to physician within the last 2 weeks: No  Guideline: Eye-Red Without Pus; Eye pain present > 24 hours  Disposition: See Physician with 24 Hours  **Pt seen at [facility] same day. Has f/u w/ PCP on 11/17/16**

## 2016-11-17 ENCOUNTER — Ambulatory Visit: Payer: Self-pay | Admitting: Family

## 2016-11-29 ENCOUNTER — Telehealth: Payer: Self-pay | Admitting: Family

## 2016-11-29 NOTE — Telephone Encounter (Signed)
Noted  

## 2016-11-29 NOTE — Telephone Encounter (Signed)
Relation to [id number] Call back [place]   Reason for call:  Patient didn't want to elaborate but would like to discuss medication, patient stated its personal, please advise

## 2016-11-29 NOTE — Telephone Encounter (Signed)
Spoke with pt. She states that she took lexapro for 2 weeks then forgot to take it for 5 days and she has not restarted it. States she could not really tell any difference when she did take it and states she doesn't think she needs anything at this time. Denies mood swings, anger, SI/HI. Pt also has an enlarging tender knot on her foot that she would like assessed. Pt already scheduled for 12/07/16 and states she cannot come in earlier due to her schedule.

## 2016-12-07 ENCOUNTER — Ambulatory Visit (INDEPENDENT_AMBULATORY_CARE_PROVIDER_SITE_OTHER): Payer: BLUE CROSS/BLUE SHIELD | Admitting: Family

## 2016-12-07 ENCOUNTER — Encounter: Payer: Self-pay | Admitting: Family

## 2016-12-07 DIAGNOSIS — F429 Obsessive-compulsive disorder, unspecified: Secondary | ICD-10-CM

## 2016-12-07 NOTE — Progress Notes (Signed)
Subjective:    Patient ID: [name], female    DOB: May 30, 1966, 50 y.o.   MRN: [id number]  HPI  [name] is a 50 yr old female who presents today for follow up of her OCD.  Last visit we gave her a trial of lexapro, however she had trouble remembering and stopped after 2 weeks.  She reports feeling well off of the lexapro. Does not seem to be having many ocd symptoms recently.  Does not wish to restart lexapro.    Review of Systems See HPI  Past Medical History:  Diagnosis Date  . Allergic rhinitis, cause unspecified   . Asthma   . History of DVT (deep vein thrombosis) 2005   Pt reports blood clot below knee ?unsure which leg.  . Nasal polyps   . Unspecified essential hypertension      Social History   Social History  . Marital status: Married    Spouse name: N/A  . Number of children: N/A  . Years of education: N/A   Occupational History  . Manager [employer]    Social History Main Topics  . Smoking status: Never Smoker  . Smokeless tobacco: Never Used     Comment: Never Used Tobacco  . Alcohol use 0.0 oz/week     Comment: 1 glass of wine per year  . Drug use: No  . Sexual activity: Yes    Birth control/ protection: None   Other Topics Concern  . 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      Past Surgical History:  Procedure Laterality Date  . LASIK Bilateral 2008  . NASAL SINUS SURGERY  [id number]   Dr Erik Obey  . OVARIAN CYST REMOVAL  02/2016   pt reported-- Dr Philis Pique  . UTERINE FIBROID SURGERY  2005    Family History  Problem Relation Age of Onset  . Cancer Mother 74    ?carcinoid tumor (had surgical removal)  . Diabetes Mother     borderline  . Heart disease Father     died 66, from MI, had hx of ETOH abuse and liver disease  . Thyroid cancer Sister     (diagnosed at  16) half sister (same dad)   . Diabetes type II Brother   . Kidney disease Neg Hx   . Hypertension Neg Hx   . Hyperlipidemia Neg Hx     Allergies  Allergen Reactions  . Aspirin     wheezing  . Penicillins Hives    Current Outpatient Prescriptions on File Prior to Visit  Medication Sig Dispense Refill  . albuterol (PROVENTIL) (2.5 MG/3ML) 0.083% nebulizer solution Use 1 vial in nebulizer as needed dx:496 150 mL PRN  . albuterol (PROVENTIL) 2 MG tablet TAKE ONE TABLET BY MOUTH TWICE DAILY (NEEDS  OFFICE  VISIT  FOR  ADDITIONAL  REFILLS) 60 tablet 0  . Albuterol Sulfate (PROAIR RESPICLICK) 123XX123 (90 Base) MCG/ACT AEPB Inhale 2 puffs into the lungs 4 (four) times daily as needed. 1 each 2  . BREO ELLIPTA 100-25 MCG/INH AEPB INHALE ONE PUFF BY MOUTH ONCE DAILY 60 each 2  . cetirizine (ZYRTEC) 10 MG tablet Take 10 mg by mouth daily.    [name] escitalopram (LEXAPRO) 10 MG tablet 1/2 tab once daily for  1 week, then increase to a full tab once daily on week two 30 tablet 0  . fluticasone furoate-vilanterol (BREO ELLIPTA) 100-25 MCG/INH AEPB Inhale 1 puff into the lungs daily. 2 each 0  . furosemide (LASIX) 20 MG tablet Take 1 tablet (20 mg total) by mouth daily. 90 tablet 0  . hydrocortisone valerate ointment (WESTCORT) 0.2 % Apply 1 application topically 2 (two) times daily. 45 g 0  . ipratropium (ATROVENT) 0.02 % nebulizer solution Use 1 vial in nebulizer as needed dx. 496 150 mL PRN  . potassium chloride 20 MEQ/15ML (10%) SOLN Take 15 mLs (20 mEq total) by mouth daily. 1350 mL 1  . predniSONE (DELTASONE) 10 MG tablet Take 1 tablet by mouth every other day.    . Prenatal Vit-Fe Sulfate-FA (PRENATAL VITAMIN PO) Take 1 capsule by mouth daily.    [name] triamcinolone (NASACORT) 55 MCG/ACT nasal inhaler Place 2 sprays into the nose daily. Reported on 03/01/2016     No current facility-administered medications on file prior to visit.     BP 115/77   Pulse 94   Temp 98.1 F (36.7 C) (Oral)   Ht 5\' 7"   (1.702 m)   Wt 204 lb 12.8 oz (92.9 kg)   LMP 09/19/2016 Comment: cycles are irregular  SpO2 97%   BMI 32.08 kg/m       Objective:   Physical Exam  Constitutional: She is oriented to person, place, and time. She appears well-developed and well-nourished.  Cardiovascular: Normal rate, regular rhythm and normal heart sounds.   No murmur heard. Pulmonary/Chest: Effort normal and breath sounds normal. No respiratory distress. She has no wheezes.  Neurological: She is alert and oriented to person, place, and time.  Psychiatric: She has a normal mood and affect. Her behavior is normal. Judgment and thought content normal.          Assessment & Plan:

## 2016-12-07 NOTE — Patient Instructions (Signed)
Let me know if your symptoms worsen

## 2016-12-07 NOTE — Progress Notes (Signed)
Pre visit review using our clinic review tool, if applicable. No additional management support is needed unless otherwise documented below in the visit note. 

## 2016-12-07 NOTE — Assessment & Plan Note (Signed)
Stable off meds.  Monitor.  

## 2016-12-14 ENCOUNTER — Telehealth: Payer: Self-pay | Admitting: Internal Medicine

## 2016-12-14 NOTE — Telephone Encounter (Signed)
Called and spoke with pt and she stated that she is having a flare of her asthma and needs to be seen by CY.  Offered appt with CY on Wednesday and Thursday and pt was insistant that she see him on Friday since she is off. I advised her that he did not have anything open.  She took the appt for Thursday afternoon.  She stated that she will call back if she cannot get off work to be seen.

## 2016-12-15 ENCOUNTER — Telehealth: Payer: Self-pay | Admitting: Internal Medicine

## 2016-12-15 NOTE — Telephone Encounter (Signed)
Called and spoke with pt and she stated that she has an appt on Thursday---she is having a hard time getting off on Thursday for her appt and would like to know if CY can work her in his schedule for Friday. She is off this day and this time would work better for her.  Pt is requesting a call back early in the am.  CY please advise. Thanks  Allergies  Allergen Reactions  . Aspirin     wheezing  . Penicillins Hives

## 2016-12-16 ENCOUNTER — Ambulatory Visit: Payer: Self-pay | Admitting: Internal Medicine

## 2016-12-17 ENCOUNTER — Ambulatory Visit (INDEPENDENT_AMBULATORY_CARE_PROVIDER_SITE_OTHER): Payer: BLUE CROSS/BLUE SHIELD | Admitting: Internal Medicine

## 2016-12-17 ENCOUNTER — Other Ambulatory Visit (INDEPENDENT_AMBULATORY_CARE_PROVIDER_SITE_OTHER): Payer: BLUE CROSS/BLUE SHIELD

## 2016-12-17 ENCOUNTER — Encounter: Payer: Self-pay | Admitting: Internal Medicine

## 2016-12-17 VITALS — BP 132/88 | HR 89 | Temp 97.9°F | Ht 67.0 in | Wt 196.8 lb

## 2016-12-17 DIAGNOSIS — J4541 Moderate persistent asthma with (acute) exacerbation: Secondary | ICD-10-CM

## 2016-12-17 DIAGNOSIS — J330 Polyp of nasal cavity: Secondary | ICD-10-CM | POA: Diagnosis not present

## 2016-12-17 DIAGNOSIS — J4540 Moderate persistent asthma, uncomplicated: Secondary | ICD-10-CM | POA: Diagnosis not present

## 2016-12-17 LAB — CBC WITH DIFFERENTIAL/PLATELET
BASOS ABS: 0 10*3/uL (ref 0.0–0.1)
Basophils Relative: 0.1 % (ref 0.0–3.0)
EOS PCT: 0 % (ref 0.0–5.0)
Eosinophils Absolute: 0 10*3/uL (ref 0.0–0.7)
HEMATOCRIT: 41.5 % (ref 36.0–46.0)
Hemoglobin: 13.5 g/dL (ref 12.0–15.0)
LYMPHS PCT: 14.5 % (ref 12.0–46.0)
Lymphs Abs: 1.1 10*3/uL (ref 0.7–4.0)
MCHC: 32.5 g/dL (ref 30.0–36.0)
MCV: 76.5 fl — AB (ref 78.0–100.0)
MONOS PCT: 4.9 % (ref 3.0–12.0)
Monocytes Absolute: 0.4 10*3/uL (ref 0.1–1.0)
NEUTROS ABS: 6.1 10*3/uL (ref 1.4–7.7)
Neutrophils Relative %: 80.5 % — ABNORMAL HIGH (ref 43.0–77.0)
PLATELETS: 278 10*3/uL (ref 150.0–400.0)
RBC: 5.42 Mil/uL — AB (ref 3.87–5.11)
RDW: 15.9 % — ABNORMAL HIGH (ref 11.5–15.5)
WBC: 7.6 10*3/uL (ref 4.0–10.5)

## 2016-12-17 MED ORDER — ALBUTEROL SULFATE 108 (90 BASE) MCG/ACT IN AEPB: 2.0000 | INHALATION_SPRAY | Freq: Four times a day (QID) | RESPIRATORY_TRACT | 12 refills | Status: DC | PRN

## 2016-12-17 MED ORDER — ALBUTEROL SULFATE 2 MG PO TABS: ORAL_TABLET | ORAL | 12 refills | Status: DC

## 2016-12-17 MED ORDER — AZITHROMYCIN 250 MG PO TABS: ORAL_TABLET | ORAL | 0 refills | Status: DC

## 2016-12-17 MED ORDER — METHYLPREDNISOLONE ACETATE 80 MG/ML IJ SUSP
80.0000 mg | Freq: Once | INTRAMUSCULAR | Status: AC
  Administered 2016-12-17: 80 mg via INTRAMUSCULAR

## 2016-12-17 MED ORDER — PREDNISONE 10 MG PO TABS: ORAL_TABLET | ORAL | 0 refills | Status: DC

## 2016-12-17 MED ORDER — LEVALBUTEROL HCL 0.63 MG/3ML IN NEBU
0.6300 mg | INHALATION_SOLUTION | Freq: Once | RESPIRATORY_TRACT | Status: AC
  Administered 2016-12-17: 0.63 mg via RESPIRATORY_TRACT

## 2016-12-17 NOTE — Assessment & Plan Note (Signed)
Probably viral syndrome initially. We're heading into long holiday weekend and agreed to make Z-Pak available. Plan-Z-Pak, encouraged hydration, symptom therapies as needed, nebulizer Xopenex, Depo-Medrol today.

## 2016-12-17 NOTE — Assessment & Plan Note (Signed)
Recurrent polyps not noted at this exam, but recognize potential for recurrence especially in context of chronic asthma.

## 2016-12-17 NOTE — Progress Notes (Signed)
Patient ID: [name], female    DOB: Oct 05, 1966, 50 y.o.   MRN: [id number] F followed for moderate persistent asthma asthma (requiring frequent steroids , Failed Xolair, , hx nasal polyps, allergic rhinitis, hx DVT PFT- 09/09/11- FEV1 1.64/ 54%, FEV1/FVC 0.54, FEF25-75% 0.74/ 22%, insignif response to bronchodilator TLC 0.74, DLCO 72%. Moderate obstructive disease, mild restriction, mild reduction of DLCO. Office spirometry- mild obstructive airways disease. FEV1 1.95/73%, FVC 3.01/93%, FEV1/FVC 0.65, FEF 25-75% 1.26/38%.   ------------------------------------------------------------------  03/01/2016-50 year old female never smoker followed for moderate persistent asthma (frequent steroids, failed Xolair), history nasal polyps, allergic rhinitis, history DVT FOLLOWS FOR: Breathing is worse since last OV. Reports SOB, coughing with production of green mucus and wheezing. Denies chest tightness.  12/17/2016-50 year old female never smoker followed for moderate persistent asthma (frequent steroids, failed Xolair), history nasal polyps, allergic rhinitis, history DVT FOLLOWS FOR: Pt here for an acute visit. Pt c/o wheezing, chest tightness, inrease in SOB, prod cough with green mucus x 4-5 days. Pt denies f/c/s. Pt states she is using her albuterol hfa 3 times per day.  Recent exacerbation beginning 5 days ago with wheezing cough, green yellow sputum, sinus congestion may be low-grade fever but no headache or sore throat, rash or swollen glands. Needs meds refilled and we discussed the medical detail  Review of Systems-See HPI Constitutional:   No weight loss, night sweats,  Fevers, chills, fatigue, lassitude. HEENT:   No headaches,  Difficulty swallowing,  Tooth/dental problems,  Sore throat,                No sneezing, itching, ear ache,, post nasal drip. + nasal congestion. CV:  No chest pain,  Orthopnea, PND, swelling in lower extremities, anasarca, dizziness, palpitations GI  No  heartburn, indigestion, abdominal pain, nausea, vomiting, Resp:Usually some shortness of breath with exertion, not at rest.  No excess mucus, + productive cough,          + non-productive cough,  No coughing up of blood.  + change in color of mucus.  + wheezing.   Skin: no rash or lesions. GU: . MS:  No joint pain, + swelling.   Neuro- nothing unusual Psych:  No change in mood or affect. No depression or anxiety.  No memory loss. Objective:   Physical Exam  General- Alert, Oriented, Affect-appropriate, Distress- none acute  Relaxed and conversational   overweight Skin- rash-none, lesions- none, excoriation- none Lymphadenopathy- none Head- atraumatic            Eyes- Gross vision intact, PERRLA, conjunctivae clear secretions            Ears- Hearing, canals normal            Nose- Clear, No-Septal dev, mucus, , erosion, perforation.             Throat- Mallampati III-IV, mucosa -not red , drainage- none, tonsils- atrophic Neck- flexible , trachea midline, no stridor , thyroid nl, carotid no bruit Chest - symmetrical excursion , unlabored           Heart/CV- RRR , no murmur , no gallop  , no rub, nl s1 s2                           - JVD- none , edema 1-2+, , stasis changes- none, varices- none           Lung- + raspy without wheeze,  unlabored, cough + , dullness-none, rub- none  Chest wall-  Abd-  Br/ Gen/ Rectal- Not done, not indicated Extrem- cyanosis- none, clubbing, none, atrophy- none, strength- nl.  Neuro- grossly intact to observation

## 2016-12-17 NOTE — Assessment & Plan Note (Signed)
>>  ASSESSMENT AND PLAN FOR NASAL POLYP WRITTEN ON 12/17/2016  4:15 PM BY YOUNG, CLINTON D, MD  Recurrent polyps not noted at this exam, but recognize potential for recurrence especially in context of chronic asthma.

## 2016-12-17 NOTE — Assessment & Plan Note (Signed)
For long-term management we want to reassess allergy component for future consideration of biologic therapies other than Xolair. The current episode is infectious and this is not a significant pollen/allergy season. Plan-CBC with differential and Allergy Profile

## 2016-12-17 NOTE — Patient Instructions (Addendum)
Script printed for prednisone pills  Scripts sent for Zpak, ProAir inhaler and albuterol pills  Neb xop 0.63    Dx acute exacerbation asthma moderate persistent  Depo 80           "  Stay warm and well- hydrated  Order- lab- CBC with diff, Allergy Profile     Dx moderate persistent asthma, complicated  Call if we can help

## 2016-12-21 LAB — RESPIRATORY ALLERGY PROFILE REGION II ~~LOC~~
ALLERGEN, COTTONWOOD, T14: 0.13 kU/L — AB
Allergen, A. alternata, m6: <0.1 kU/L
Allergen, C. Herbarum, M2: <0.1 kU/L
Allergen, Comm Silver Birch, t9: <0.1 kU/L
Allergen, Mulberry, t76: <0.1 kU/L
Allergen, P. notatum, m1: <0.1 kU/L
Box Elder IgE: <0.1 kU/L
CAT DANDER: 0.53 kU/L — AB
COMMON RAGWEED: 1.66 kU/L — AB
Cockroach: <0.1 kU/L
D. farinae: <0.1 kU/L
Dog Dander: <0.1 kU/L
Elm IgE: 0.17 kU/L — ABNORMAL HIGH
IgE (Immunoglobulin E), Serum: 47 kU/L (ref <115)
Johnson Grass: <0.1 kU/L
PECAN/HICKORY TREE IGE: 0.13 kU/L — AB
Rough Pigweed  IgE: <0.1 kU/L
Sheep Sorrel IgE: <0.1 kU/L

## 2016-12-21 NOTE — Telephone Encounter (Signed)
Pt was seen on 12/29 by CY.  Nothing further is needed.

## 2016-12-30 ENCOUNTER — Other Ambulatory Visit: Payer: Self-pay | Admitting: Family

## 2016-12-30 MED ORDER — FUROSEMIDE 20 MG PO TABS: 20.0000 mg | ORAL_TABLET | Freq: Every day | ORAL | 0 refills | Status: DC

## 2016-12-30 NOTE — Telephone Encounter (Signed)
Rx sent 

## 2016-12-30 NOTE — Telephone Encounter (Signed)
Relation to [id number] Call back [place]: [place], [place]. [phone] (Phone) [phone] (Fax)     Reason for call:  Patient requesting a 3 month supply of furosemide (LASIX) 20 MG tablet, 4 pills left.

## 2017-02-17 LAB — HM MAMMOGRAPHY: HM MAMMO: NORMAL (ref 0–4)

## 2017-02-21 ENCOUNTER — Telehealth: Payer: Self-pay | Admitting: Internal Medicine

## 2017-02-21 NOTE — Telephone Encounter (Signed)
Spoke with the pt  She is c/o runny nose and has had yellow nasal d/c x 2 wks  Please advise, thanks!

## 2017-02-21 NOTE — Telephone Encounter (Signed)
LMTCB

## 2017-02-21 NOTE — Telephone Encounter (Signed)
Offer Zpak 

## 2017-02-22 MED ORDER — AZITHROMYCIN 250 MG PO TABS: 250.0000 mg | ORAL_TABLET | ORAL | 0 refills | Status: DC

## 2017-02-22 NOTE — Telephone Encounter (Signed)
Pt aware that Zpak will be sent to pharmacy. Pt requests that we send Rx to [facility] in [place] on [address]. Nothing further needed.

## 2017-03-26 ENCOUNTER — Other Ambulatory Visit: Payer: Self-pay | Admitting: Internal Medicine

## 2017-04-01 ENCOUNTER — Telehealth: Payer: Self-pay | Admitting: Internal Medicine

## 2017-04-01 NOTE — Telephone Encounter (Signed)
Called and spoke with pt and she stated that she has been having problems with her asthma.  She stated that she has been having some wheezing since Tuesday.  She is aware that appt has been scheduled with CY on Monday at 4, but pt stated that she is off on Tuesday and wanted to see if CY could work her in at that time instead.  CY please advise.  Pt is aware that CY will return to the office on Monday.  Thanks  Allergies  Allergen Reactions  . Aspirin     wheezing  . Penicillins Hives

## 2017-04-04 ENCOUNTER — Ambulatory Visit: Payer: Self-pay | Admitting: Internal Medicine

## 2017-04-04 NOTE — Telephone Encounter (Signed)
Pt is on CY's schedule for today at 4:00pm.

## 2017-04-04 NOTE — Telephone Encounter (Signed)
Veronica Jersey- can we or an NP see her on Tuesday

## 2017-04-05 ENCOUNTER — Encounter: Payer: Self-pay | Admitting: *Deleted

## 2017-04-05 ENCOUNTER — Telehealth: Payer: Self-pay | Admitting: Internal Medicine

## 2017-04-05 ENCOUNTER — Ambulatory Visit (INDEPENDENT_AMBULATORY_CARE_PROVIDER_SITE_OTHER): Payer: BLUE CROSS/BLUE SHIELD | Admitting: Internal Medicine

## 2017-04-05 ENCOUNTER — Encounter: Payer: Self-pay | Admitting: Internal Medicine

## 2017-04-05 DIAGNOSIS — J4541 Moderate persistent asthma with (acute) exacerbation: Secondary | ICD-10-CM | POA: Diagnosis not present

## 2017-04-05 MED ORDER — PREDNISONE 10 MG PO TABS: ORAL_TABLET | ORAL | 1 refills | Status: DC

## 2017-04-05 MED ORDER — AZITHROMYCIN 250 MG PO TABS: ORAL_TABLET | ORAL | 0 refills | Status: DC

## 2017-04-05 NOTE — Telephone Encounter (Signed)
Sure- perhaps an NP if available

## 2017-04-05 NOTE — Telephone Encounter (Signed)
We were not aware of patient being out of town and absolutely no charge for NS appt from yesterday. We can see patient tomorrow Wednesday 04/06/17 at 11:00 am. Thanks.

## 2017-04-05 NOTE — Progress Notes (Signed)
Patient ID: [name], female    DOB: [birth year], 51 y.o.   MRN: [id number] F followed for moderate persistent asthma asthma (requiring frequent steroids , Failed Xolair, , hx nasal polyps, allergic rhinitis, hx DVT PFT- 09/09/11- FEV1 1.64/ 54%, FEV1/FVC 0.54, FEF25-75% 0.74/ 22%, insignif response to bronchodilator TLC 0.74, DLCO 72%. Moderate obstructive disease, mild restriction, mild reduction of DLCO. Office spirometry- mild obstructive airways disease. FEV1 1.95/73%, FVC 3.01/93%, FEV1/FVC 0.65, FEF 25-75% 1.26/38%.   ------------------------------------------------------------------  03/01/2016-51 year old female never smoker followed for moderate persistent asthma (frequent steroids, failed Xolair), history nasal polyps, allergic rhinitis, history DVT FOLLOWS FOR: Breathing is worse since last OV. Reports SOB, coughing with production of green mucus and wheezing. Denies chest tightness.  12/17/2016-51 year old female never smoker followed for moderate persistent asthma (frequent steroids, failed Xolair), history nasal polyps, allergic rhinitis, history DVT FOLLOWS FOR: Pt here for an acute visit. Pt c/o wheezing, chest tightness, inrease in SOB, prod cough with green mucus x 4-5 days. Pt denies f/c/s. Pt states she is using her albuterol hfa 3 times per day.  Recent exacerbation beginning 5 days ago with wheezing cough, green yellow sputum, sinus congestion may be low-grade fever but no headache or sore throat, rash or swollen glands. Needs meds refilled and we discussed the medical detail  04/05/17- 51 year old female never smoker followed for moderate persistent asthma (frequent steroids, failed Xolair), history nasal polyps, allergic rhinitis, history DVT FOLLOWS FOR: Pt is a walk-in at 4pm. Pt c/o increase in SOB, rhinorrhea with yellow mucus, and wheezing x 3-4 days. Pt denies CP/tightness and f/c/s.  Walked in-acute visit after telephone calls trying to schedule. Mild  exacerbation with rhinorrhea and increased chest tightness. Some yellow nasal discharge. No fever. She is concerned because she is about to leave for vacation in [place].  Review of Systems-See HPI Constitutional:   No weight loss, night sweats,  Fevers, chills, fatigue, lassitude. HEENT:   No headaches,  Difficulty swallowing,  Tooth/dental problems,  Sore throat,                No sneezing, itching, ear ache,, + post nasal drip. + nasal congestion. CV:  No chest pain,  Orthopnea, PND, swelling in lower extremities, anasarca, dizziness, palpitations GI  No heartburn, indigestion, abdominal pain, nausea, vomiting, Resp:Usually some shortness of breath with exertion, not at rest.  No excess mucus, + productive cough,          + non-productive cough,  No coughing up of blood.  + change in color of mucus.  + wheezing.   Skin: no rash or lesions. GU: . MS:  No joint pain, + swelling.   Neuro- nothing unusual Psych:  No change in mood or affect. No depression or anxiety.  No memory loss. Objective:   Physical Exam  General- Alert, Oriented, Affect-appropriate, Distress- none acute  Relaxed and conversational   + overweight Skin- rash-none, lesions- none, excoriation- none Lymphadenopathy- none Head- atraumatic            Eyes- Gross vision intact, PERRLA, conjunctivae clear secretions            Ears- Hearing, canals normal            Nose- Clear, No-Septal dev, mucus, , erosion, perforation.             Throat- Mallampati III-IV, mucosa -not red , drainage- none, tonsils- atrophic Neck- flexible , trachea midline, no stridor , thyroid nl, carotid no bruit Chest - symmetrical excursion ,  unlabored           Heart/CV- RRR , no murmur , no gallop  , no rub, nl s1 s2                           - JVD- none , edema 1-2+, , stasis changes- none, varices- none           Lung-  wheeze+,  unlabored, cough + , dullness-none, rub- none           Chest wall-  Abd-  Br/ Gen/ Rectal- Not done, not  indicated Extrem- cyanosis- none, clubbing, none, atrophy- none, strength- nl.  Neuro- grossly intact to observation

## 2017-04-05 NOTE — Telephone Encounter (Signed)
Pt cannot come tomorrow. Pt can only come on days that she is off >> she is off today and wants to be seen today.  Can she see another provider besides CY? Thanks.

## 2017-04-05 NOTE — Telephone Encounter (Signed)
LM x 1 for patient to schedule appt >> 3 options Pt can be seen today with TP is able to make it by 345 Pt can be seen with CY 04/06/17 at 11:00 Pt can be seen in [facility] with TP 04/07/17

## 2017-04-05 NOTE — Telephone Encounter (Signed)
Pt arrived at office requesting to be seen. Pt c/o slight increase in SOB, rhinorrhea with yellow mucus, and wheezing x 3-4 days. Spoke with CY and was informed to add pt on to CY's schedule. Pt's appt was added onto 1130 appt slot, pt seen by CY. Nothing further needed at this time. Will sign off.

## 2017-04-05 NOTE — Assessment & Plan Note (Addendum)
Her main concern is that she is leaving for [place] and doesn't want to be sick on the trip Plan-Z-Pak, prednisone taper, Depo-Medrol

## 2017-04-05 NOTE — Telephone Encounter (Signed)
Pt No Showed her appt 04/04/17 d/t being out of town.  Pt states told the the nurse on Friday that she did not want to schedule for 04/04/17 d/t not being 100% sure if she would be able to commit to the appt. Pt states that she requested a call back for a possible appt on Tuesday and was never called back to schedule this. Pt missed appt yesterday and is wanting to make sure that she will not be charged for a NS fee. I spoke with Patrice up front and was advised that she should not get a NS fee charge. Pt aware that if she receives anything to contact our office.   Elie Confer, CMA  4:29 PM  Note    Called and spoke with pt and she stated that she has been having problems with her asthma.  She stated that she has been having some wheezing since Tuesday.  She is aware that appt has been scheduled with CY on Monday at 4, but pt stated that she is off on Tuesday and wanted to see if CY could work her in at that time instead.  CY please advise.  Pt is aware that CY will return to the office on Monday.  Thanks     Please advise Dr Annamaria Boots if the patient can be worked in this with you this morning at the end of clinic or if she can see another provider. Thanks.

## 2017-04-05 NOTE — Telephone Encounter (Signed)
Patient states did not receive a phone call about an appointment, requesting to speak to a nurse about being seen today with CY.Mearl Latin

## 2017-04-05 NOTE — Patient Instructions (Addendum)
Order- depo 80     Dx exacerbation asthma  Script sent for prednisone taper and antibiotic ( Z pak)  Please call as needed

## 2017-04-06 MED ORDER — METHYLPREDNISOLONE ACETATE 80 MG/ML IJ SUSP
80.0000 mg | Freq: Once | INTRAMUSCULAR | Status: AC
  Administered 2017-04-05: 80 mg via INTRAMUSCULAR

## 2017-04-06 NOTE — Addendum Note (Signed)
Addended by: Collier Salina on: 04/06/2017 09:19 AM   Modules accepted: Orders

## 2017-07-08 ENCOUNTER — Telehealth: Payer: Self-pay | Admitting: Family

## 2017-07-08 MED ORDER — FUROSEMIDE 20 MG PO TABS: 20.0000 mg | ORAL_TABLET | Freq: Every day | ORAL | 0 refills | Status: DC

## 2017-07-08 NOTE — Telephone Encounter (Signed)
Caller name: Vivica Dobosz Sides Relationship to patient: self Can be reached: [phone] Pharmacy: [facility], [place].  Reason for call: Pt is out of lasix. Appt scheduled for 07/13/17. Please send to pharmacy.

## 2017-07-08 NOTE — Telephone Encounter (Signed)
Rx sent to pharmacy for #30 tablets. Notified pt.

## 2017-07-13 ENCOUNTER — Ambulatory Visit (INDEPENDENT_AMBULATORY_CARE_PROVIDER_SITE_OTHER): Payer: BLUE CROSS/BLUE SHIELD | Admitting: Family

## 2017-07-13 ENCOUNTER — Telehealth: Payer: Self-pay | Admitting: Family

## 2017-07-13 VITALS — BP 134/90 | HR 88 | Temp 97.8°F | Ht 67.0 in | Wt 203.6 lb

## 2017-07-13 DIAGNOSIS — I10 Essential (primary) hypertension: Secondary | ICD-10-CM

## 2017-07-13 DIAGNOSIS — R5383 Other fatigue: Secondary | ICD-10-CM | POA: Diagnosis not present

## 2017-07-13 DIAGNOSIS — F429 Obsessive-compulsive disorder, unspecified: Secondary | ICD-10-CM

## 2017-07-13 DIAGNOSIS — R739 Hyperglycemia, unspecified: Secondary | ICD-10-CM | POA: Diagnosis not present

## 2017-07-13 DIAGNOSIS — R2241 Localized swelling, mass and lump, right lower limb: Secondary | ICD-10-CM | POA: Diagnosis not present

## 2017-07-13 LAB — BASIC METABOLIC PANEL
BUN: 12 mg/dL (ref 6–23)
CO2: 27 mEq/L (ref 19–32)
CREATININE: 0.83 mg/dL (ref 0.40–1.20)
Calcium: 9.3 mg/dL (ref 8.4–10.5)
Chloride: 105 mEq/L (ref 96–112)
GFR: 93.71 mL/min (ref >60.00)
Glucose, Bld: 85 mg/dL (ref 70–99)
Potassium: 3.2 mEq/L — ABNORMAL LOW (ref 3.5–5.1)
Sodium: 140 mEq/L (ref 135–145)

## 2017-07-13 LAB — CBC WITH DIFFERENTIAL/PLATELET
BASOS ABS: 0 10*3/uL (ref 0.0–0.1)
Basophils Relative: 0.5 % (ref 0.0–3.0)
EOS ABS: 0.7 10*3/uL (ref 0.0–0.7)
Eosinophils Relative: 7.5 % — ABNORMAL HIGH (ref 0.0–5.0)
HCT: 41.6 % (ref 36.0–46.0)
HEMOGLOBIN: 13.5 g/dL (ref 12.0–15.0)
Lymphocytes Relative: 23.2 % (ref 12.0–46.0)
Lymphs Abs: 2.2 10*3/uL (ref 0.7–4.0)
MCHC: 32.4 g/dL (ref 30.0–36.0)
MCV: 81 fl (ref 78.0–100.0)
MONO ABS: 0.8 10*3/uL (ref 0.1–1.0)
Monocytes Relative: 8.4 % (ref 3.0–12.0)
Neutro Abs: 5.8 10*3/uL (ref 1.4–7.7)
Neutrophils Relative %: 60.4 % (ref 43.0–77.0)
Platelets: 317 10*3/uL (ref 150.0–400.0)
RBC: 5.14 Mil/uL — AB (ref 3.87–5.11)
RDW: 13.4 % (ref 11.5–15.5)
WBC: 9.6 10*3/uL (ref 4.0–10.5)

## 2017-07-13 LAB — TSH: TSH: 1.82 u[IU]/mL (ref 0.35–4.50)

## 2017-07-13 LAB — HEMOGLOBIN A1C: Hgb A1c MFr Bld: 6.7 % — ABNORMAL HIGH (ref 4.6–6.5)

## 2017-07-13 MED ORDER — ESCITALOPRAM OXALATE 5 MG PO TABS: ORAL_TABLET | ORAL | 0 refills | Status: DC

## 2017-07-13 MED ORDER — FUROSEMIDE 20 MG PO TABS: 20.0000 mg | ORAL_TABLET | Freq: Every day | ORAL | 5 refills | Status: DC

## 2017-07-13 NOTE — Patient Instructions (Signed)
Please begin Lexapro 5 mg 1 tablet once daily for 1 week. On week 2 can increase to 2 tabs once daily. Please take this medication at night. Please complete blood work prior to leaving.

## 2017-07-13 NOTE — Progress Notes (Signed)
Subjective:    Patient ID: [name], female    DOB: 10-12-1966, 51 y.o.   MRN: [id number]  HPI [name] is a 51 year old female who presents today for follow-up.   OCD- we previously gave her a trial of Lexapro which she ultimately discontinued. She did not take regularly or if her very long. Note she did not see improvement during the time that she took it. Last visit she noted that her symptoms were stable off of meds. Today she notes that she continues to have issues with OCD. Reports that she is a "double checker" 2 of 3 times. It has caused her to be late for work on several occasions.    Hypertension-she has history of chronic lower extremity edema for which she uses furosemide once daily  BP Readings from Last 3 Encounters:  07/13/17 134/90  04/05/17 (!) 128/94  12/17/16 132/88    Wt Readings from Last 3 Encounters:  07/13/17 203 lb 9.6 oz (92.4 kg)  12/17/16 196 lb 12.8 oz (89.3 kg)  12/07/16 204 lb 12.8 oz (92.9 kg)   Asthma- patient continues to follow with Dr. Annamaria Boots for her asthma. She is maintained on 300 with done, and as needed albuterol. Reports that she continues prednisone 10mg  once daily,   Borderline diabetes-  Lab Results  Component Value Date   HGBA1C 6.5 04/12/2016   HGBA1C 6.0 10/20/2015   HGBA1C 6.2 01/13/2015   Lab Results  Component Value Date   LDLCALC 113 (H) 10/12/2016   CREATININE 0.81 10/21/2016   She reports positive fatigue. She continues to have menstrual cycles however they are light. She does have some nighttime hot flashes.  Review of Systems  See HPI  Past Medical History:  Diagnosis Date  . Allergic rhinitis, cause unspecified   . Asthma   . History of DVT (deep vein thrombosis) 2005   Pt reports blood clot below knee ?unsure which leg.  . Nasal polyps   . Unspecified essential hypertension      Social History   Social History  . Marital status: Married    Spouse name: N/A  . Number of children: N/A  . Years  of education: N/A   Occupational History  . Manager [employer]    Social History Main Topics  . Smoking status: Never Smoker  . Smokeless tobacco: Never Used     Comment: Never Used Tobacco  . Alcohol use 0.0 oz/week     Comment: 1 glass of wine per year  . Drug use: No  . Sexual activity: Yes    Birth control/ protection: None   Other Topics Concern  . 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      Past Surgical History:  Procedure Laterality Date  . LASIK Bilateral 2008  . NASAL SINUS SURGERY  2229   Dr Erik Obey  . OVARIAN CYST REMOVAL  02/2016   pt reported-- Dr Philis Pique  . UTERINE FIBROID SURGERY  2005    Family History  Problem Relation Age of Onset  . Cancer Mother 72       ?carcinoid tumor (had surgical removal)  . Diabetes Mother        borderline  . Heart disease Father        died 24,  from MI, had hx of ETOH abuse and liver disease  . Thyroid cancer Sister        (diagnosed at 62) half sister (same dad)   . Diabetes type II Brother   . Kidney disease Neg Hx   . Hypertension Neg Hx   . Hyperlipidemia Neg Hx     Allergies  Allergen Reactions  . Aspirin     wheezing  . Penicillins Hives  . Shellfish Allergy     Current Outpatient Prescriptions on File Prior to Visit  Medication Sig Dispense Refill  . albuterol (PROVENTIL) (2.5 MG/3ML) 0.083% nebulizer solution Use 1 vial in nebulizer as needed dx:496 150 mL PRN  . albuterol (PROVENTIL) 2 MG tablet TAKE ONE TABLET BY MOUTH TWICE DAILY 60 tablet 12  . Albuterol Sulfate (PROAIR RESPICLICK) 389 (90 Base) MCG/ACT AEPB Inhale 2 puffs into the lungs 4 (four) times daily as needed. 1 each 12  . BREO ELLIPTA 100-25 MCG/INH AEPB INHALE ONE PUFF BY MOUTH ONCE DAILY 60 each 5  . cetirizine (ZYRTEC) 10 MG tablet Take 10 mg by mouth daily.    [name]  ipratropium (ATROVENT) 0.02 % nebulizer solution Use 1 vial in nebulizer as needed dx. 496 150 mL PRN  . potassium chloride 20 MEQ/15ML (10%) SOLN Take 15 mLs (20 mEq total) by mouth daily. 1350 mL 1  . predniSONE (DELTASONE) 10 MG tablet Take 10 mg by mouth daily.     . Prenatal Vit-Fe Sulfate-FA (PRENATAL VITAMIN PO) Take 1 capsule by mouth daily.    [name] triamcinolone (NASACORT) 55 MCG/ACT nasal inhaler Place 2 sprays into the nose daily. Reported on 03/01/2016     No current facility-administered medications on file prior to visit.     BP 134/90   Pulse 88   Temp 97.8 F (36.6 C) (Oral)   Ht 5\' 7"  (1.702 m)   Wt 203 lb 9.6 oz (92.4 kg)   LMP 06/22/2017   SpO2 98%   BMI 31.89 kg/m        Objective:   Physical Exam  Constitutional: She is oriented to person, place, and time. She appears well-developed and well-nourished.  HENT:  Head: Normocephalic and atraumatic.  Cardiovascular: Normal rate, regular rhythm and normal heart sounds.   No murmur heard. Pulmonary/Chest: Effort normal and breath sounds normal. No respiratory distress. She has no wheezes.  Musculoskeletal: She exhibits no edema.  Neurological: She is alert and oriented to person, place, and time.  Psychiatric: She has a normal mood and affect. Her behavior is normal. Judgment and thought content normal.          Assessment & Plan:  Fatigue-will check CBC and TSH.  Hyperglycemia-will obtain A1c. This is likely worsened by her chronic steroid use for her asthma.  Asthma-currently stable, this is being managed by pulmonology.  Hypertension- BP is acceptable on current medications. Continue same.   Foot mass-has a soft nontender soft tissue mass noted on right heel medially. This is bothering her. She would like further evaluation. I will arrange an appointment with podiatry for further evaluation.  OCD-uncontrolled. She would like a retrial of lexapro.

## 2017-07-13 NOTE — Telephone Encounter (Signed)
Please contact patient and let her know that her potassium is low. Has she missed any doses? If she has been missing doses and want her to resume 15 mL once daily of potassium chloride solution. If she has been taking regularly then I would recommend that she take 15 mL twice daily. Either way I would like for her to repeat a basic metabolic panel in 1 week. Diagnosis is hypokalemia.  Sugar is up slightly Truman Hayward with an A1c of 6.7.

## 2017-07-14 MED ORDER — POTASSIUM CHLORIDE ER 10 MEQ PO TBCR: 20.0000 meq | EXTENDED_RELEASE_TABLET | Freq: Every day | ORAL | 5 refills | Status: DC

## 2017-07-14 NOTE — Telephone Encounter (Signed)
rx sent

## 2017-07-14 NOTE — Telephone Encounter (Signed)
Spoke to pt who states that she "probably missed a few doses of her potassium". Pt would like to know if there is a smaller potassium pill she could take instead of the larger one she was on before. Pt made aware that she will need to repeat labs in a week. Pt states that she will have to call back to schedule the lab appt because she does not have her schedule in front of her.   Please advise.

## 2017-07-14 NOTE — Telephone Encounter (Signed)
We can do kdur 38mEQ tabs 2 tabs once daily. They are a little smaller but she needs to take 2 instead of 1. Let me know if she would like to switch and I will send to her pharmacy.

## 2017-07-14 NOTE — Telephone Encounter (Signed)
Spoke with patient she has agreed to taking the smaller tablets. She also wants to new rx sent to [facility] on [address] and she also said is it possible the rx is generic.   PC

## 2017-08-02 ENCOUNTER — Ambulatory Visit (INDEPENDENT_AMBULATORY_CARE_PROVIDER_SITE_OTHER): Payer: BLUE CROSS/BLUE SHIELD | Admitting: Podiatry

## 2017-08-02 ENCOUNTER — Other Ambulatory Visit: Payer: Self-pay | Admitting: Family

## 2017-08-02 ENCOUNTER — Other Ambulatory Visit (INDEPENDENT_AMBULATORY_CARE_PROVIDER_SITE_OTHER): Payer: BLUE CROSS/BLUE SHIELD

## 2017-08-02 ENCOUNTER — Encounter: Payer: Self-pay | Admitting: Podiatry

## 2017-08-02 DIAGNOSIS — M799 Soft tissue disorder, unspecified: Secondary | ICD-10-CM

## 2017-08-02 DIAGNOSIS — M25474 Effusion, right foot: Secondary | ICD-10-CM

## 2017-08-02 DIAGNOSIS — E876 Hypokalemia: Secondary | ICD-10-CM

## 2017-08-02 DIAGNOSIS — M7989 Other specified soft tissue disorders: Secondary | ICD-10-CM

## 2017-08-02 LAB — BASIC METABOLIC PANEL
BUN: 9 mg/dL (ref 6–23)
CALCIUM: 9.1 mg/dL (ref 8.4–10.5)
CO2: 27 meq/L (ref 19–32)
CREATININE: 0.71 mg/dL (ref 0.40–1.20)
Chloride: 108 mEq/L (ref 96–112)
GFR: 112.19 mL/min (ref >60.00)
GLUCOSE: 91 mg/dL (ref 70–99)
Potassium: 3.6 mEq/L (ref 3.5–5.1)
SODIUM: 140 meq/L (ref 135–145)

## 2017-08-02 NOTE — Progress Notes (Signed)
   Subjective:    Patient ID: [name], female    DOB: [birth year], 51 y.o.   MRN: [id number]  HPI  Ms. Haily Sides Presents the office today for concerns of assistance swelling to the inside aspect of the right foot and ankle which is been ongoing for about 1 year. She denies any recent injury or trauma denies any pain that she has no severity has been getting bigger which has irritated her in shoes. She discuss this with her primary care physician and are observing the area but since any bigger she was to have the area checked. She denies any numbness or tingling she denies any redness or warmth to the area. No recent injury or trauma she's had no other treatment.  Review of Systems  All other systems reviewed and are negative.      Objective:   Physical Exam General: AAO x3, NAD  Dermatological: Skin is warm, dry and supple bilateral. Nails x 10 are well manicured; remaining integument appears unremarkable at this time. There are no open sores, no preulcerative lesions, no rash or signs of infection present.  Vascular: Dorsalis Pedis artery and Posterior Tibial artery pedal pulses are 2/4 bilateral with immedate capillary fill time. There is no pain with calf compression, swelling, warmth, erythema.   Neruologic: Grossly intact via light touch bilateral. Vibratory intact via tuning fork bilateral. Protective threshold with Semmes Wienstein monofilament intact to all pedal sites bilateral.  Musculoskeletal: Along the medial aspect of the foot just proximal to navicular tuberosity is a small area of fluid-filled cyst which is mobile. There is swelling on the series well which does limit the evaluation of the cyst. There is no tenderness palpation. There is no discoloration of the skin. Is no erythema or increase in warmth. This no other area tenderness identified. Muscular strength 5/5 in all groups tested bilateral.  Gait: Unassisted, Nonantalgic.    Assessment & Plan:    51 year old female with right foot soft tissue mass medial foot proximal to navicular tuberosity -Treatment options discussed including all alternatives, risks, and complications -Etiology of symptoms were discussed -At this point given the enlarging of the mass as well as the location I returned an MRI to further evaluate the area. This is ordered for her today. I discussed with her treatment options but we will await the results of the MRI before proceeding with further treatment. This is for possible surgical planning as well.  Celesta Gentile, DPM

## 2017-08-02 NOTE — Progress Notes (Signed)
Pt walked in to the office stating she was supposed to have her tsh rechecked. Per 07/13/17 phone note, pt needs repeat bmet as potassium was low. There was no change of thyroid dose and no mention of repeat at this time. Lab order placed for bmet.

## 2017-08-03 ENCOUNTER — Encounter: Payer: Self-pay | Admitting: Family

## 2017-08-03 ENCOUNTER — Other Ambulatory Visit: Payer: Self-pay

## 2017-08-03 DIAGNOSIS — M7989 Other specified soft tissue disorders: Secondary | ICD-10-CM

## 2017-08-03 DIAGNOSIS — M25474 Effusion, right foot: Secondary | ICD-10-CM

## 2017-08-15 ENCOUNTER — Telehealth: Payer: Self-pay | Admitting: *Deleted

## 2017-08-15 ENCOUNTER — Ambulatory Visit: Payer: Self-pay | Admitting: Family

## 2017-08-15 NOTE — Telephone Encounter (Signed)
Pt called stating she is unable to make appt scheduled today at 6:15pm due to work conflict. She has rescheduled to 08/26/17 at 9:45am. She will call if she needs enough medication called in to get her to appt. Pt wanted to confirm that she would not be charged a no show fee for today. Advised pt I did not think no show fee would be applied but would let her know if different outcome. Please advise?

## 2017-08-15 NOTE — Telephone Encounter (Signed)
No charge. tks

## 2017-08-23 ENCOUNTER — Telehealth: Payer: Self-pay | Admitting: Internal Medicine

## 2017-08-23 MED ORDER — PREDNISONE 10 MG PO TABS: ORAL_TABLET | ORAL | 0 refills | Status: DC

## 2017-08-23 NOTE — Telephone Encounter (Signed)
Pt requesting refill of prednisone 10mg  #200tabs. Refill request was originally for prednisone taper, but called pt to verify with her if she was needing the taper and she stated that she was just needing the prednisone 10mg  with a quantity of 200tabs to keep on hand just in case if she has any flare-ups with her allergies/asthma. Pt last seen at office on 04/05/17.  Dr. Annamaria Boots, please advise if it is okay for [place] to send in prednisone 10mg  for pt and if so, what quantity and how many refills?  Thanks.  Allergies  Allergen Reactions  . Aspirin     wheezing  . Penicillins Hives  . Shellfish Allergy     Current Outpatient Prescriptions:  .  albuterol (PROVENTIL) (2.5 MG/3ML) 0.083% nebulizer solution, Use 1 vial in nebulizer as needed dx:496, Disp: 150 mL, Rfl: PRN .  albuterol (PROVENTIL) 2 MG tablet, TAKE ONE TABLET BY MOUTH TWICE DAILY, Disp: 60 tablet, Rfl: 12 .  Albuterol Sulfate (PROAIR RESPICLICK) 009 (90 Base) MCG/ACT AEPB, Inhale 2 puffs into the lungs 4 (four) times daily as needed., Disp: 1 each, Rfl: 12 .  BREO ELLIPTA 100-25 MCG/INH AEPB, INHALE ONE PUFF BY MOUTH ONCE DAILY, Disp: 60 each, Rfl: 5 .  cetirizine (ZYRTEC) 10 MG tablet, Take 10 mg by mouth daily., Disp: , Rfl:  .  escitalopram (LEXAPRO) 5 MG tablet, 1 tab once daily for 1 week, then increase to 2 tabs once daily on week two, Disp: 60 tablet, Rfl: 0 .  furosemide (LASIX) 20 MG tablet, Take 1 tablet (20 mg total) by mouth daily., Disp: 30 tablet, Rfl: 5 .  ipratropium (ATROVENT) 0.02 % nebulizer solution, Use 1 vial in nebulizer as needed dx. 496, Disp: 150 mL, Rfl: PRN .  potassium chloride (K-DUR) 10 MEQ tablet, Take 2 tablets (20 mEq total) by mouth daily., Disp: 60 tablet, Rfl: 5 .  predniSONE (DELTASONE) 10 MG tablet, Take 10 mg by mouth daily. , Disp: , Rfl:  .  Prenatal Vit-Fe Sulfate-FA (PRENATAL VITAMIN PO), Take 1 capsule by mouth daily., Disp: , Rfl:  .  triamcinolone (NASACORT) 55 MCG/ACT nasal inhaler, Place 2  sprays into the nose daily. Reported on 03/01/2016, Disp: , Rfl:

## 2017-08-23 NOTE — Telephone Encounter (Signed)
rx has been sent in to the pharmacy for the pt per her request. Nothing further is needed.

## 2017-08-23 NOTE — Telephone Encounter (Signed)
Ok to refill # 200

## 2017-08-25 ENCOUNTER — Telehealth: Payer: Self-pay | Admitting: Internal Medicine

## 2017-08-25 NOTE — Telephone Encounter (Signed)
Called and spoke with pt and she stated that the  Prednisone that was sent in is normally $4 and she stated that the pharmacy told her this rx will be $10.  She stated that she will call them and see if they can call us to let us know how this rx needs to be worded so she can get this for $4.  Will hold message for call back.

## 2017-08-26 ENCOUNTER — Ambulatory Visit
Admission: RE | Admit: 2017-08-26 | Discharge: 2017-08-26 | Disposition: A | Discharge status: Home / Self Care | Payer: BLUE CROSS/BLUE SHIELD | Source: Ambulatory Visit | Attending: Podiatry | Admitting: Podiatry

## 2017-08-26 ENCOUNTER — Telehealth: Payer: Self-pay | Admitting: Family

## 2017-08-26 ENCOUNTER — Ambulatory Visit: Payer: Self-pay | Admitting: Family

## 2017-08-26 DIAGNOSIS — M7989 Other specified soft tissue disorders: Secondary | ICD-10-CM

## 2017-08-26 DIAGNOSIS — M25474 Effusion, right foot: Secondary | ICD-10-CM

## 2017-08-26 DIAGNOSIS — Z0289 Encounter for other administrative examinations: Secondary | ICD-10-CM

## 2017-08-26 MED ORDER — GADOBENATE DIMEGLUMINE 529 MG/ML IV SOLN
20.0000 mL | Freq: Once | INTRAVENOUS | Status: AC | PRN
  Administered 2017-08-26: 18 mL via INTRAVENOUS

## 2017-08-26 NOTE — Telephone Encounter (Addendum)
Notified pt of below. She is currently on her way back from vacation and will not be back in time to be seen today. Pt states her insurance is terrible and has large deductible. States she is unable to afford to come in as "often as we are requiring due to finances and still paying on medical costs from last year". Advised pt I would relay concerns to PCP but at this time office visit is still indicated.  Pt states she will have to call us back Monday when she has her schedule for the week to see when she can make an appointment. Please advise if any other recommendations?

## 2017-08-26 NOTE — Telephone Encounter (Signed)
Noted. No other recommendations at this time. I will need to see her in order to safely continue to prescribe the medication.

## 2017-08-26 NOTE — Telephone Encounter (Signed)
Pt was started on lexapro on 07/13/17 for OCD and has not had refill since that time. Is it ok to send refill until she can be seen? She has had to cancel appt on 08/15/17 and 08/26/17.  Please advise?

## 2017-08-26 NOTE — Telephone Encounter (Signed)
Needs OV prior to additional refills please.

## 2017-08-26 NOTE — Telephone Encounter (Signed)
Spoke with the pt  She states that she is going to go by the pharmacy today and will call us if there is an issue Will go ahead and close this encounter

## 2017-08-26 NOTE — Telephone Encounter (Signed)
Patient called in to cancel her appointment, states she cannot make it in today and does not have her schedule so she was unable to reschedule. She is requesting a call back to discuss having refills on her medications sent in until she is able to make another appointment.

## 2017-09-01 ENCOUNTER — Telehealth: Payer: Self-pay | Admitting: Family

## 2017-09-01 NOTE — Telephone Encounter (Signed)
°  Relation to EH:OZYY Call back Ramona: [name], [name].  Reason for call:  Patient requesting a refill furosemide (LASIX) 20 MG tablet,  Patient states she would like to discuss medication but didn't want to elaborate, please advise

## 2017-09-02 NOTE — Telephone Encounter (Signed)
Spoke with pt. Advised her that Rx should have refills on file from 07/13/17 and she will need to speak with the pharmacy to have rx released and pt voices understanding. Also reports a couple of bug bites after recent hotel stay. Pt has appt with PCP on 09/05/17 and will discuss with her at that time. Declines to come in earlier for evaluation.

## 2017-09-05 ENCOUNTER — Telehealth: Payer: Self-pay | Admitting: Family

## 2017-09-05 ENCOUNTER — Ambulatory Visit: Payer: Self-pay | Admitting: Family

## 2017-09-05 NOTE — Telephone Encounter (Signed)
Pt left and then came back and rescheduled for next Monday at 6:45p

## 2017-09-05 NOTE — Telephone Encounter (Signed)
Pt came in at 7:02 to be seen. Pt says that she was stuck in traffic. Tried rescheduling pt. She said that she is not sure of her schedule and would need to call us back to reschedule. Pt says that she would like to not be charged due to her coming and just being late.   Please advise.

## 2017-09-06 ENCOUNTER — Ambulatory Visit (INDEPENDENT_AMBULATORY_CARE_PROVIDER_SITE_OTHER): Payer: BLUE CROSS/BLUE SHIELD | Admitting: Podiatry

## 2017-09-06 ENCOUNTER — Encounter: Payer: Self-pay | Admitting: Podiatry

## 2017-09-06 DIAGNOSIS — M6740 Ganglion, unspecified site: Secondary | ICD-10-CM

## 2017-09-06 DIAGNOSIS — M779 Enthesopathy, unspecified: Secondary | ICD-10-CM | POA: Diagnosis not present

## 2017-09-06 NOTE — Patient Instructions (Signed)
Ganglion Cyst A ganglion cyst is a noncancerous, fluid-filled lump that occurs near joints or tendons. The ganglion cyst grows out of a joint or the lining of a tendon. It most often develops in the hand or wrist, but it can also develop in the shoulder, elbow, hip, knee, ankle, or foot. The round or oval ganglion cyst can be the size of a pea or larger than a grape. Increased activity may enlarge the size of the cyst because more fluid starts to build up. What are the causes? It is not known what causes a ganglion cyst to grow. However, it may be related to:  Inflammation or irritation around the joint.  An injury.  Repetitive movements or overuse.  Arthritis.  What increases the risk? Risk factors include:  Being a woman.  Being age 20-50.  What are the signs or symptoms? Symptoms may include:  A lump. This most often appears on the hand or wrist, but it can occur in other areas of the body.  Tingling.  Pain.  Numbness.  Muscle weakness.  Weak grip.  Less movement in a joint.  How is this diagnosed? Ganglion cysts are most often diagnosed based on a physical exam. Your health care provider will feel the lump and may shine a light alongside it. If it is a ganglion cyst, a light often shines through it. Your health care provider may order an X-ray, ultrasound, or MRI to rule out other conditions. How is this treated? Ganglion cysts usually go away on their own without treatment. If pain or other symptoms are involved, treatment may be needed. Treatment is also needed if the ganglion cyst limits your movement or if it gets infected. Treatment may include:  Wearing a brace or splint on your wrist or finger.  Taking anti-inflammatory medicine.  Draining fluid from the lump with a needle (aspiration).  Injecting a steroid into the joint.  Surgery to remove the ganglion cyst.  Follow these instructions at home:  Do not press on the ganglion cyst, poke it with a  needle, or hit it.  Take medicines only as directed by your health care provider.  Wear your brace or splint as directed by your health care provider.  Watch your ganglion cyst for any changes.  Keep all follow-up visits as directed by your health care provider. This is important. Contact a health care provider if:  Your ganglion cyst becomes larger or more painful.  You have increased redness, red streaks, or swelling.  You have pus coming from the lump.  You have weakness or numbness in the affected area.  You have a fever or chills. This information is not intended to replace advice given to you by your health care provider. Make sure you discuss any questions you have with your health care provider. Document Released: 12/03/2000 Document Revised: 05/13/2016 Document Reviewed: 05/21/2014 Elsevier Interactive Patient Education  2018 [employer] Inc[employer]  

## 2017-09-06 NOTE — Telephone Encounter (Signed)
No charge. 

## 2017-09-07 ENCOUNTER — Other Ambulatory Visit: Payer: Self-pay | Admitting: Podiatry

## 2017-09-08 LAB — TIQ- AMBIGUOUS ORDER

## 2017-09-08 NOTE — Progress Notes (Signed)
Subjective: 51 year old female presents the office if a full evaluation of right foot pain meniscus MRI results. She states that she is doing about the same and she's had pain for about 1 year. She denies any acute changes since last appointment she has no new concerns. Denies any systemic complaints such as fevers, chills, nausea, vomiting. No acute changes since last appointment, and no other complaints at this time.   Objective: AAO x3, NAD DP/PT pulses palpable bilaterally, CRT less than 3 seconds There is mild tenderness along the course of the posterior tibial tendon mostly distally. It doesn't be a fluid-filled cyst just proximal to the navicular tuberosity and is tenderness palpation gently on the cyst is also tenderness on the history tibial tendon but is difficult to evaluate more the majority tenderness is coming from. There is erythema or skin change. there is no other areas of tenderness identified at this time. there is a decrease in medial arch height. No open lesions or pre-ulcerative lesions.  No pain with calf compression, swelling, warmth, erythema  --------------------- MRI 08/26/2017 IMPRESSION: 1. Tenosynovitis of the distal posterior tibial tendon sheath with severe surrounding soft tissue edema and an adjacent focal multiloculated fluid collection measuring 1.4 x 1.4 x 2 cm without significant enhancement likely reflecting a small ganglion cyst. -------------------------  Assessment: Left posterior tibial tendinitis, ganglion cyst  Plan: -All treatment options discussed with the patient including all alternatives, risks, complications.  MRI results were discussed the patient. This time a discussed with conservative as well as surgical treatment. We'll start with more conservative treatment. Discussed with her aspiration of the cyst with steroid injection and she wishes to proceed with this. Under standard conditions a mixture of lidocaine and Marcaine plain was  infiltrated in a regional block fashion around the cyst. Next the area was prepped and utilizing 18-gauge needle was able to aspirate clear, jellylike fluid. Small amount of dexamethasone was infiltrated into the cyst. Compression bandage was applied. Post injection care was discussed. I did place her into a Tri-Lock ankle brace today for support. I did send the cyst fluid to pathology and this was given to Cranford Mon, CMA.  -Follow-up as scheduled or sooner if needed. -Patient encouraged to call the office with any questions, concerns, change in symptoms.   Celesta Gentile, DPM

## 2017-09-09 ENCOUNTER — Telehealth: Payer: Self-pay | Admitting: *Deleted

## 2017-09-09 NOTE — Telephone Encounter (Signed)
[facility] called questions concerning lab specimen.

## 2017-09-12 ENCOUNTER — Other Ambulatory Visit: Payer: Self-pay

## 2017-09-12 ENCOUNTER — Encounter: Payer: Self-pay | Admitting: Family

## 2017-09-12 ENCOUNTER — Other Ambulatory Visit: Payer: Self-pay | Admitting: Podiatry

## 2017-09-12 ENCOUNTER — Ambulatory Visit (INDEPENDENT_AMBULATORY_CARE_PROVIDER_SITE_OTHER): Payer: Self-pay | Admitting: Family

## 2017-09-12 VITALS — BP 149/90 | HR 80 | Temp 98.4°F | Resp 18 | Ht 67.0 in | Wt 207.0 lb

## 2017-09-12 DIAGNOSIS — M6740 Ganglion, unspecified site: Secondary | ICD-10-CM

## 2017-09-12 DIAGNOSIS — F429 Obsessive-compulsive disorder, unspecified: Secondary | ICD-10-CM

## 2017-09-12 NOTE — Progress Notes (Signed)
Order verbally corrected with [facility] to run cytology. Unable to run as pathology/dermatology because specimen was fluid

## 2017-09-12 NOTE — Progress Notes (Signed)
Subjective:    Patient ID: [name], female    DOB: 1966/07/11, 51 y.o.   MRN: [id number]  HPI  [name] is a 51 yr old female who presents today for follow up of her OCD.  Last visit we restarted her Lexapro due to her ongoing OCD symptoms. She reports that she did not note significant improvement on the Lexapro so she discontinued. She does not wish to restart the medication. Reports that she seems to be managing okay on her own. She did tolerate the medication without any adverse effects.  BP Readings from Last 3 Encounters:  09/12/17 (!) 149/90  07/13/17 134/90  04/05/17 (!) 128/94    Review of Systems Past Medical History:  Diagnosis Date  . Allergic rhinitis, cause unspecified   . Asthma   . History of DVT (deep vein thrombosis) 2005   Pt reports blood clot below knee ?unsure which leg.  . Nasal polyps   . Unspecified essential hypertension      Social History   Social History  . Marital status: Married    Spouse name: N/A  . Number of children: N/A  . Years of education: N/A   Occupational History  . Manager [employer]    Social History Main Topics  . Smoking status: Never Smoker  . Smokeless tobacco: Never Used     Comment: Never Used Tobacco  . Alcohol use 0.0 oz/week     Comment: 1 glass of wine per year  . Drug use: No  . Sexual activity: Yes    Birth control/ protection: None   Other Topics Concern  . 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      Past Surgical History:  Procedure Laterality Date  . LASIK Bilateral 2008  . NASAL SINUS SURGERY  2585   Dr Erik Obey  . OVARIAN CYST REMOVAL  02/2016   pt reported-- Dr Philis Pique  . UTERINE FIBROID SURGERY  2005    Family History  Problem Relation Age of Onset  . Cancer Mother 101       ?carcinoid tumor (had surgical removal)    . Diabetes Mother        borderline  . Heart disease Father        died 79, from MI, had hx of ETOH abuse and liver disease  . Thyroid cancer Sister        (diagnosed at 92) half sister (same dad)   . Diabetes type II Brother   . Kidney disease Neg Hx   . Hypertension Neg Hx   . Hyperlipidemia Neg Hx     Allergies  Allergen Reactions  . Aspirin     wheezing  . Penicillins Hives  . Shellfish Allergy     Current Outpatient Prescriptions on File Prior to Visit  Medication Sig Dispense Refill  . albuterol (PROVENTIL) (2.5 MG/3ML) 0.083% nebulizer solution Use 1 vial in nebulizer as needed dx:496 150 mL PRN  . albuterol (PROVENTIL) 2 MG tablet TAKE ONE TABLET BY MOUTH TWICE DAILY 60 tablet 12  . Albuterol Sulfate (PROAIR RESPICLICK) 277 (90 Base) MCG/ACT AEPB Inhale 2 puffs into the lungs 4 (four) times daily as needed. 1 each 12  . BREO ELLIPTA 100-25 MCG/INH AEPB INHALE ONE PUFF BY MOUTH ONCE  DAILY 60 each 5  . cetirizine (ZYRTEC) 10 MG tablet Take 10 mg by mouth daily.    . furosemide (LASIX) 20 MG tablet Take 1 tablet (20 mg total) by mouth daily. 30 tablet 5  . ipratropium (ATROVENT) 0.02 % nebulizer solution Use 1 vial in nebulizer as needed dx. 496 150 mL PRN  . potassium chloride (K-DUR) 10 MEQ tablet Take 2 tablets (20 mEq total) by mouth daily. 60 tablet 5  . predniSONE (DELTASONE) 10 MG tablet Take as directed 200 tablet 0  . Prenatal Vit-Fe Sulfate-FA (PRENATAL VITAMIN PO) Take 1 capsule by mouth daily.    [name] escitalopram (LEXAPRO) 5 MG tablet 1 tab once daily for 1 week, then increase to 2 tabs once daily on week two (Patient not taking: Reported on 09/12/2017) 60 tablet 0   No current facility-administered medications on file prior to visit.     BP (!) 149/90 (BP Location: Right Arm, Cuff Size: Large)   Pulse 80   Temp 98.4 F (36.9 C) (Oral)   Resp 18   Ht 5\' 7"  (1.702 m)   Wt 207 lb (93.9 kg)   LMP 08/19/2017   SpO2 100%   BMI 32.42 kg/m       Objective:    Physical Exam  Constitutional: She is oriented to person, place, and time. She appears well-developed and well-nourished. No distress.  Neurological: She is alert and oriented to person, place, and time.  Psychiatric: She has a normal mood and affect. Her behavior is normal. Judgment and thought content normal.          Assessment & Plan:

## 2017-09-13 LAB — CYTOLOGY - NON PAP

## 2017-09-13 LAB — NON-GYN, SPECIMEN A

## 2017-09-15 NOTE — Assessment & Plan Note (Signed)
Fair control off of medication. Does not desire to restart the medication. We did discuss that if she did not see a lot of improvement on the 10 mg dose at the 20 mg dose might have worked better for her. She does not wish retrial. Will monitor for now.   A total of 15 minutes were spent face-to-face with the patient during this encounter and over half of that time was spent on counseling and coordination of care. The patient was counseled on ocd tratment.

## 2017-09-30 ENCOUNTER — Other Ambulatory Visit: Payer: Self-pay | Admitting: Internal Medicine

## 2017-09-30 ENCOUNTER — Encounter: Payer: Self-pay | Admitting: *Deleted

## 2017-10-04 ENCOUNTER — Telehealth: Payer: Self-pay | Admitting: Internal Medicine

## 2017-10-04 NOTE — Telephone Encounter (Signed)
Spoke with pt, advised CY message. Appt slot for Friday at 11:30 was placed. Nothing further is needed.

## 2017-10-04 NOTE — Telephone Encounter (Signed)
Called and spoke with pt and she stated that today is her last day on the zpak---she stated that her congestion has cleared up some as far as being discolored, but she still has the congestion and has a little rattle in her chest.  She would like to be seen on Thursday or Friday by CY only.  CY please advise since you have no held spots on either day and are booked up 100%.  Thanks  Allergies  Allergen Reactions  . Aspirin     wheezing  . Penicillins Hives  . Shellfish Allergy

## 2017-10-04 NOTE — Telephone Encounter (Signed)
Ok to double at 11:30

## 2017-10-06 ENCOUNTER — Encounter: Payer: Self-pay | Admitting: Podiatry

## 2017-10-06 ENCOUNTER — Ambulatory Visit (INDEPENDENT_AMBULATORY_CARE_PROVIDER_SITE_OTHER): Payer: BLUE CROSS/BLUE SHIELD | Admitting: Podiatry

## 2017-10-06 DIAGNOSIS — M779 Enthesopathy, unspecified: Secondary | ICD-10-CM

## 2017-10-06 DIAGNOSIS — M6740 Ganglion, unspecified site: Secondary | ICD-10-CM

## 2017-10-06 NOTE — Progress Notes (Signed)
Subjective: 51 year old female presents the office they for follow-up evaluation of right foot pain on the posterior tibial tendinitis and ganglion cyst. She states that since the last appointment she's had no pain to the area and overall she is been doing much better. She still wears the ankle brace intermittently. She still can fill some fluid along the cystic area but she no longer has any pain. She denies any redness or warmth and overall she is improving and she is happy. She has no other concerns today. Denies any systemic complaints such as fevers, chills, nausea, vomiting. No acute changes since last appointment, and no other complaints at this time.   Objective: AAO x3, NAD DP/PT pulses palpable bilaterally, CRT less than 3 seconds Mild evidence of ganglion cyst along the distal portion of the posterior tibial tendon but there is no pain along the area today. There is no tenderness along the posterior tibial tendon. Tendon appears be intact. Is no swelling. Is no erythema or increase in warmth. No signs of infection. There is no area of tenderness identified bilaterally. No open lesions or pre-ulcerative lesions.  No pain with calf compression, swelling, warmth, erythema  Assessment: Posterior tibial tendinitis, ganglion cyst currently without symptoms  Plan: -All treatment options discussed with the patient including all alternatives, risks, complications.  -At this point she is doing well she's having no pain to the area. We discussed in the future Synvisc and back possible surgical intervention with the sister will likely drain the fluid again. We will hold off on this today she's having no pain. Ankle brace as needed. Also discussed shoe gear modifications and orthotics given the posterior tibial tendinitis. -RTC prn. She agrees to this. She has no further questions today.  -Patient encouraged to call the office with any questions, concerns, change in symptoms.   Celesta Gentile,  DPM

## 2017-10-07 ENCOUNTER — Encounter: Payer: Self-pay | Admitting: Internal Medicine

## 2017-10-07 ENCOUNTER — Ambulatory Visit (INDEPENDENT_AMBULATORY_CARE_PROVIDER_SITE_OTHER)
Admission: RE | Admit: 2017-10-07 | Discharge: 2017-10-07 | Disposition: A | Discharge status: Home / Self Care | Payer: Self-pay | Source: Ambulatory Visit | Attending: Internal Medicine | Admitting: Internal Medicine

## 2017-10-07 ENCOUNTER — Ambulatory Visit (INDEPENDENT_AMBULATORY_CARE_PROVIDER_SITE_OTHER): Payer: BLUE CROSS/BLUE SHIELD | Admitting: Internal Medicine

## 2017-10-07 ENCOUNTER — Ambulatory Visit: Payer: BLUE CROSS/BLUE SHIELD | Admitting: Podiatry

## 2017-10-07 VITALS — BP 124/70 | HR 84 | Ht 67.0 in | Wt 201.6 lb

## 2017-10-07 DIAGNOSIS — J4541 Moderate persistent asthma with (acute) exacerbation: Secondary | ICD-10-CM | POA: Diagnosis not present

## 2017-10-07 DIAGNOSIS — J302 Other seasonal allergic rhinitis: Secondary | ICD-10-CM

## 2017-10-07 DIAGNOSIS — J3089 Other allergic rhinitis: Secondary | ICD-10-CM | POA: Diagnosis not present

## 2017-10-07 DIAGNOSIS — J330 Polyp of nasal cavity: Secondary | ICD-10-CM

## 2017-10-07 MED ORDER — PREDNISONE 10 MG PO TABS: ORAL_TABLET | ORAL | 1 refills | Status: DC

## 2017-10-07 MED ORDER — LEVALBUTEROL HCL 0.63 MG/3ML IN NEBU
0.6300 mg | INHALATION_SOLUTION | Freq: Once | RESPIRATORY_TRACT | Status: AC
  Administered 2017-10-07: 0.63 mg via RESPIRATORY_TRACT

## 2017-10-07 MED ORDER — FLUTICASONE FUROATE-VILANTEROL 200-25 MCG/INH IN AEPB: 1.0000 | INHALATION_SPRAY | Freq: Every day | RESPIRATORY_TRACT | 12 refills | Status: DC

## 2017-10-07 MED ORDER — DOXYCYCLINE HYCLATE 100 MG PO TABS: 100.0000 mg | ORAL_TABLET | Freq: Two times a day (BID) | ORAL | 1 refills | Status: DC

## 2017-10-07 MED ORDER — METHYLPREDNISOLONE ACETATE 80 MG/ML IJ SUSP
80.0000 mg | Freq: Once | INTRAMUSCULAR | Status: AC
  Administered 2017-10-07: 80 mg via INTRAMUSCULAR

## 2017-10-07 NOTE — Assessment & Plan Note (Signed)
>>  ASSESSMENT AND PLAN FOR NASAL POLYP WRITTEN ON 10/07/2017  4:39 PM BY YOUNG, CLINTON D, MD  None seen at this visit

## 2017-10-07 NOTE — Progress Notes (Signed)
Patient ID: [name], female    DOB: Sep 07, 1966, 51 y.o.   MRN: [id number] F followed for moderate persistent asthma asthma (requiring frequent steroids , Failed Xolair, , hx nasal polyps, allergic rhinitis, hx DVT PFT- 09/09/11- FEV1 1.64/ 54%, FEV1/FVC 0.54, FEF25-75% 0.74/ 22%, insignif response to bronchodilator TLC 0.74, DLCO 72%. Moderate obstructive disease, mild restriction, mild reduction of DLCO. Office spirometry- mild obstructive airways disease. FEV1 1.95/73%, FVC 3.01/93%, FEV1/FVC 0.65, FEF 25-75% 1.26/38%.   ----------------------------------------------------------------- 04/05/17- 51 year old female never smoker followed for moderate persistent asthma (frequent steroids, failed Xolair), history nasal polyps, allergic rhinitis, history DVT FOLLOWS FOR: Pt is a walk-in at 4pm. Pt c/o increase in SOB, rhinorrhea with yellow mucus, and wheezing x 3-4 days. Pt denies CP/tightness and f/c/s.  Walked in-acute visit after telephone calls trying to schedule. Mild exacerbation with rhinorrhea and increased chest tightness. Some yellow nasal discharge. No fever. She is concerned because she is about to leave for vacation in [place].  10/07/17- 51 year old female never smoker followed for moderate persistent asthma (frequent steroids, failed Xolair), history nasal polyps, allergic rhinitis, history DVT --Asthma: Pt completed Zpak but continues to have chest congestion and wheezing.  Nebulizer machine, pro-air HFA, Proventil 2 mg tablet, Breo 100, prednisone 10 mg tablets taken if needed. Blames seasonal weather change in the last 2 weeks for triggering exacerbation.  She finished Z-Pak 2 days ago.  Green sputum cleared but she is still coughing productively with white.  Continues maintenance prednisone 10 mg every other day with last prednisone taper 1 month ago.  Steroid discussion again.  Review of Systems-See HPI Constitutional:   No weight loss, night sweats,  Fevers, chills,  fatigue, lassitude. HEENT:   No headaches,  Difficulty swallowing,  Tooth/dental problems,  Sore throat,                No sneezing, itching, ear ache,, + post nasal drip. + nasal congestion. CV:  No chest pain,  Orthopnea, PND, swelling in lower extremities, anasarca, dizziness, palpitations GI  No heartburn, indigestion, abdominal pain, nausea, vomiting, Resp:Usually some shortness of breath with exertion, not at rest.  No excess mucus, + productive cough,          + non-productive cough,  No coughing up of blood.   change in color of mucus.  + wheezing.   Skin: no rash or lesions. GU: . MS:  No joint pain, + swelling.   Neuro- nothing unusual Psych:  No change in mood or affect. No depression or anxiety.  No memory loss. Objective:   Physical Exam  General- Alert, Oriented, Affect-appropriate, Distress- none acute  Relaxed and conversational   + overweight Skin- rash-none, lesions- none, excoriation- none Lymphadenopathy- none Head- atraumatic            Eyes- Gross vision intact, PERRLA, conjunctivae clear secretions            Ears- Hearing, canals normal            Nose- Clear, No-Septal dev, mucus, , erosion, perforation.             Throat- Mallampati III-IV, mucosa -not red , drainage- none, tonsils- atrophic Neck- flexible , trachea midline, no stridor , thyroid nl, carotid no bruit Chest - symmetrical excursion , unlabored           Heart/CV- RRR , no murmur , no gallop  , no rub, nl s1 s2                           -  JVD- none , edema 1-2+, , stasis changes- none, varices- none           Lung-  wheeze+ slight,  unlabored, cough + , dullness-none, rub- none           Chest wall-  Abd-  Br/ Gen/ Rectal- Not done, not indicated Extrem- cyanosis- none, clubbing, none, atrophy- none, strength- nl.  Neuro- grossly intact to observation

## 2017-10-07 NOTE — Patient Instructions (Signed)
Scripts sent for prednisone taper and doxycycline antibiotic  Script printed to change Breo to 200 strength next time you need it filled  Order- CXR   Dx asthma moderate persistent  Neb xop 0.63  Depo 80  Please call as needed

## 2017-10-07 NOTE — Assessment & Plan Note (Signed)
Typical exacerbation for her.  We talked again about steroid use.  We need to look at whether another trial with a biologic like Nucala or Berna Bue would be appropriate. Plan-nebulizer treatment Xopenex, Depo-Medrol.  Prescription for prednisone taper and doxycycline to hold going into this weekend.

## 2017-10-07 NOTE — Assessment & Plan Note (Signed)
So far this fall she is not having significant rhinitis symptoms despite exacerbation of lower respiratory asthmatic bronchitis.

## 2017-10-07 NOTE — Assessment & Plan Note (Signed)
None seen at this visit

## 2017-10-10 ENCOUNTER — Encounter: Payer: Self-pay | Admitting: Family

## 2017-10-10 ENCOUNTER — Telehealth: Payer: Self-pay | Admitting: Internal Medicine

## 2017-10-10 NOTE — Telephone Encounter (Signed)
Notes recorded by Deneise Lever, MD on 10/09/2017 at 7:17 PM EDT CXR- heart and lungs look ok. No active process seen.       Advised pt of results. Pt understood and nothing further is needed.

## 2017-10-14 ENCOUNTER — Ambulatory Visit (INDEPENDENT_AMBULATORY_CARE_PROVIDER_SITE_OTHER): Payer: BLUE CROSS/BLUE SHIELD | Admitting: Family

## 2017-10-14 VITALS — BP 138/72 | HR 74 | Temp 97.8°F | Ht 67.0 in | Wt 202.8 lb

## 2017-10-14 DIAGNOSIS — D229 Melanocytic nevi, unspecified: Secondary | ICD-10-CM

## 2017-10-14 DIAGNOSIS — L989 Disorder of the skin and subcutaneous tissue, unspecified: Secondary | ICD-10-CM | POA: Diagnosis not present

## 2017-10-14 DIAGNOSIS — Z0000 Encounter for general adult medical examination without abnormal findings: Secondary | ICD-10-CM

## 2017-10-14 NOTE — Progress Notes (Signed)
Subjective:    Patient ID: [name], female    DOB: 14-Nov-1966, 51 y.o.   MRN: [id number]  HPI  [name] is a 51 yr old female who presents today for cpx.  Patient presents today for complete physical.  Immunizations: 02/17/17 Diet: reports that diet is healthy Exercise:  Walks 2 days a week for 1 hour Colonoscopy:  Due Pap Smear: 02/04/15 Mammogram:  02/17/17 Dental:  Up to date Vision: 2/18 Wt Readings from Last 3 Encounters:  10/14/17 202 lb 12.8 oz (92 kg)  10/07/17 201 lb 9.6 oz (91.4 kg)  09/12/17 207 lb (93.9 kg)        Review of Systems  Constitutional: Negative for unexpected weight change.  HENT: Negative for hearing loss and rhinorrhea.   Eyes: Negative for visual disturbance.  Respiratory: Negative for cough.   Cardiovascular:       Reports improvement in ankle swelling  Gastrointestinal: Negative for constipation and diarrhea.  Genitourinary: Negative for dysuria and frequency.  Musculoskeletal: Negative for arthralgias and myalgias.  Skin: Negative for rash.  Neurological: Negative for headaches.  Hematological: Negative for adenopathy.  Psychiatric/Behavioral:       Reports anxiety is improved. Denies depression.        Past Medical History:  Diagnosis Date  . Allergic rhinitis, cause unspecified   . Asthma   . History of DVT (deep vein thrombosis) 2005   Pt reports blood clot below knee ?unsure which leg.  . Nasal polyps   . Unspecified essential hypertension      Social History   Social History  . Marital status: Married    Spouse name: N/A  . Number of children: N/A  . Years of education: N/A   Occupational History  . Manager [employer]    Social History Main Topics  . Smoking status: Never Smoker  . Smokeless tobacco: Never Used     Comment: Never Used Tobacco  . Alcohol use 0.0 oz/week     Comment: 1 glass of wine per year  . Drug use: No  . Sexual activity: Yes    Birth control/ protection: None   Other Topics  Concern  . 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      Past Surgical History:  Procedure Laterality Date  . LASIK Bilateral 2008  . NASAL SINUS SURGERY  7829   Dr Erik Obey  . OVARIAN CYST REMOVAL  02/2016   pt reported-- Dr Philis Pique  . UTERINE FIBROID SURGERY  2005    Family History  Problem Relation Age of Onset  . Cancer Mother 60       ?carcinoid tumor (had surgical removal)  . Diabetes Mother        borderline  . Heart disease Father        died 22, from MI, had hx of ETOH abuse and liver disease  . Thyroid cancer Sister        (diagnosed at 45) half sister (same dad)   . Diabetes type II Brother   . Kidney disease Neg Hx   . Hypertension Neg Hx   . Hyperlipidemia Neg Hx     Allergies  Allergen Reactions  . Aspirin     wheezing  . Penicillins Hives  . Shellfish Allergy  Current Outpatient Prescriptions on File Prior to Visit  Medication Sig Dispense Refill  . albuterol (PROVENTIL) (2.5 MG/3ML) 0.083% nebulizer solution Use 1 vial in nebulizer as needed dx:496 150 mL PRN  . albuterol (PROVENTIL) 2 MG tablet TAKE ONE TABLET BY MOUTH TWICE DAILY 60 tablet 12  . Albuterol Sulfate (PROAIR RESPICLICK) 267 (90 Base) MCG/ACT AEPB Inhale 2 puffs into the lungs 4 (four) times daily as needed. 1 each 12  . cetirizine (ZYRTEC) 10 MG tablet Take 10 mg by mouth daily.    [name] doxycycline (VIBRA-TABS) 100 MG tablet Take 1 tablet (100 mg total) by mouth 2 (two) times daily. 14 tablet 1  . escitalopram (LEXAPRO) 5 MG tablet 1 tab once daily for 1 week, then increase to 2 tabs once daily on week two 60 tablet 0  . fluticasone furoate-vilanterol (BREO ELLIPTA) 200-25 MCG/INH AEPB Inhale 1 puff into the lungs daily. Rinse mouth 60 each 12  . furosemide (LASIX) 20 MG tablet Take 1 tablet (20 mg total) by mouth daily. 30  tablet 5  . ipratropium (ATROVENT) 0.02 % nebulizer solution Use 1 vial in nebulizer as needed dx. 496 150 mL PRN  . potassium chloride (K-DUR) 10 MEQ tablet Take 2 tablets (20 mEq total) by mouth daily. 60 tablet 5  . predniSONE (DELTASONE) 10 MG tablet Take as directed (Patient taking differently: Take 10 mg by mouth. Take as directed; pt stated, "Takes on and off for asthma") 200 tablet 0  . predniSONE (DELTASONE) 10 MG tablet 4 X 2 DAYS, 3 X 2 DAYS, 2 X 2 DAYS, 1 X 2 DAYS 20 tablet 1  . Prenatal Vit-Fe Sulfate-FA (PRENATAL VITAMIN PO) Take 1 capsule by mouth daily.     No current facility-administered medications on file prior to visit.     BP 138/72   Pulse 74   Temp 97.8 F (36.6 C) (Oral)   Ht 5\' 7"  (1.702 m)   Wt 202 lb 12.8 oz (92 kg)   LMP 10/06/2017   SpO2 100%   BMI 31.76 kg/m    Objective:   Physical Exam  Physical Exam  Constitutional: She is oriented to person, place, and time. She appears well-developed and well-nourished. No distress.  HENT:  Head: Normocephalic and atraumatic.  Right Ear: Tympanic membrane and ear canal normal.  Left Ear: Tympanic membrane and ear canal normal.  Mouth/Throat: Oropharynx is clear and moist.  Eyes: Pupils are equal, round, and reactive to light. No scleral icterus.  Neck: Normal range of motion. No thyromegaly present.  Cardiovascular: Normal rate and regular rhythm.   No murmur heard. Pulmonary/Chest: Effort normal and breath sounds normal. No respiratory distress. He has no wheezes. She has no rales. She exhibits no tenderness.  Abdominal: Soft. Bowel sounds are normal. She exhibits no distension and no mass. There is no tenderness. There is no rebound and no guarding.  Musculoskeletal: She exhibits no edema.  Lymphadenopathy:    She has no cervical adenopathy.  Neurological: She is alert and oriented to person, place, and time.  She exhibits normal muscle tone. Coordination normal.  Skin: Skin is warm and dry. multiple  skin tags noted on neck, firm nodular hyperpigmented lesion left lower leg medially beneath knee Psychiatric: She has a normal mood and affect. Her behavior is normal. Judgment and thought content normal.  Breast/pelvic: deferred to GYN         Assessment & Plan:         Assessment & Plan:  Preventative  care- discussed healthy diet, exercise. Pap, mammo up to date.  Flu shot and pneumovax up to date. Refer for colo, obtain routine labs. EKG tracing is personally reviewed. PR interval is mildly shortened but improved from last ekg.  EKG notes NSR.  No acute changes.    Skin lesion- referral to derm for multiple skin tags as well as an enlarging mole left lower leg.

## 2017-10-14 NOTE — Patient Instructions (Addendum)
Please complete lab work prior to leaving [facility] to schedule  Your colonoscopy. [phone]

## 2017-10-20 ENCOUNTER — Encounter: Payer: Self-pay | Admitting: Internal Medicine

## 2017-11-04 ENCOUNTER — Telehealth: Payer: Self-pay | Admitting: *Deleted

## 2017-11-04 NOTE — Telephone Encounter (Signed)
Received mail request from [facility] lab for ICD.10 code, complete insurance and pt contact information. Faxed on Request for Billing Information form, ICD.10 - M67.40, copy of insurance card, and demographics form to [employer].

## 2017-11-07 ENCOUNTER — Telehealth: Payer: Self-pay | Admitting: Internal Medicine

## 2017-11-07 NOTE — Telephone Encounter (Signed)
Spoke with pharmacist, he informed me to call this number. I did PA on covermymeds and the medication was approved.   [name] (Key: [id number])   This request has been approved. Please note any additional information provided by [facility] at the bottom of your screen.  Attempted to call pharmacy to let them know. Busy signal X 3. Will try again later.    [phone]

## 2017-11-08 NOTE — Telephone Encounter (Signed)
Left detailed message on pharmacy voicemail to make aware of PA approval.  Will close encounter.

## 2017-11-23 ENCOUNTER — Ambulatory Visit (AMBULATORY_SURGERY_CENTER): Payer: Self-pay | Admitting: *Deleted

## 2017-11-23 ENCOUNTER — Other Ambulatory Visit: Payer: Self-pay

## 2017-11-23 VITALS — Ht 67.0 in | Wt 212.2 lb

## 2017-11-23 DIAGNOSIS — Z1211 Encounter for screening for malignant neoplasm of colon: Secondary | ICD-10-CM

## 2017-11-23 MED ORDER — NA SULFATE-K SULFATE-MG SULF 17.5-3.13-1.6 GM/177ML PO SOLN: 1.0000 | Freq: Once | ORAL | 0 refills | Status: AC

## 2017-11-23 NOTE — Progress Notes (Signed)
Denies allergies to eggs or soy products. Denies complications with sedation or anesthesia. Denies O2 use. Denies use of diet or weight loss medications.  Emmi instructions given for colonoscopy.  

## 2017-11-24 ENCOUNTER — Encounter: Payer: Self-pay | Admitting: Internal Medicine

## 2017-11-25 ENCOUNTER — Telehealth: Payer: Self-pay | Admitting: Internal Medicine

## 2017-11-25 MED ORDER — ALBUTEROL SULFATE 108 (90 BASE) MCG/ACT IN AEPB: 2.0000 | INHALATION_SPRAY | Freq: Four times a day (QID) | RESPIRATORY_TRACT | 12 refills | Status: DC | PRN

## 2017-11-25 MED ORDER — ALBUTEROL SULFATE 2 MG PO TABS: ORAL_TABLET | ORAL | 12 refills | Status: DC

## 2017-11-25 NOTE — Telephone Encounter (Signed)
Called pt who stated she needed a refill of her rescue inhaler. Sent in refill of med to pt's pharmacy. Nothing further needed.

## 2017-11-30 ENCOUNTER — Other Ambulatory Visit: Payer: Self-pay

## 2017-11-30 ENCOUNTER — Encounter: Payer: Self-pay | Admitting: Internal Medicine

## 2017-11-30 ENCOUNTER — Ambulatory Visit (AMBULATORY_SURGERY_CENTER): Payer: BLUE CROSS/BLUE SHIELD | Admitting: Internal Medicine

## 2017-11-30 VITALS — BP 103/79 | HR 71 | Temp 98.4°F | Resp 15 | Ht 67.0 in | Wt 212.0 lb

## 2017-11-30 DIAGNOSIS — D122 Benign neoplasm of ascending colon: Secondary | ICD-10-CM | POA: Diagnosis not present

## 2017-11-30 DIAGNOSIS — Z1211 Encounter for screening for malignant neoplasm of colon: Secondary | ICD-10-CM

## 2017-11-30 DIAGNOSIS — D123 Benign neoplasm of transverse colon: Secondary | ICD-10-CM

## 2017-11-30 MED ORDER — SODIUM CHLORIDE 0.9 % IV SOLN: 500.0000 mL | Freq: Once | INTRAVENOUS | Status: DC

## 2017-11-30 NOTE — Op Note (Signed)
[place] [facility] Patient Name: [name] Procedure Date: 11/30/2017 1:20 PM MRN: [id number] Endoscopist: Jerene Bears , MD Age: 51 Referring MD:  Date of Birth: 1966-07-03 Gender: Female Account #: [id number] Procedure:                Colonoscopy Indications:              Screening for colorectal malignant neoplasm, This                            is the patient's first colonoscopy Medicines:                Monitored Anesthesia Care Procedure:                Pre-Anesthesia Assessment:                           - Prior to the procedure, a History and Physical                            was performed, and patient medications and                            allergies were reviewed. The patient's tolerance of                            previous anesthesia was also reviewed. The risks                            and benefits of the procedure and the sedation                            options and risks were discussed with the patient.                            All questions were answered, and informed consent                            was obtained. Prior Anticoagulants: The patient has                            taken no previous anticoagulant or antiplatelet                            agents. ASA Grade Assessment: II - A patient with                            mild systemic disease. After reviewing the risks                            and benefits, the patient was deemed in                            satisfactory condition to undergo the procedure.  After obtaining informed consent, the colonoscope                            was passed under direct vision. Throughout the                            procedure, the patient's blood pressure, pulse, and                            oxygen saturations were monitored continuously. The                            Colonoscope was introduced through the anus and                            advanced to the the  cecum, identified by                            appendiceal orifice and ileocecal valve. The                            colonoscopy was performed without difficulty. The                            patient tolerated the procedure well. The quality                            of the bowel preparation was good. The ileocecal                            valve, appendiceal orifice, and rectum were                            photographed. Scope In: 1:49:42 PM Scope Out: 2:11:17 PM Scope Withdrawal Time: 0 hours 18 minutes 5 seconds  Total Procedure Duration: 0 hours 21 minutes 35 seconds  Findings:                 The digital rectal exam was normal.                           A 12 mm polyp was found in the ascending colon. The                            polyp was sessile. The polyp was removed with a hot                            snare. Resection and retrieval were complete.                           Two sessile polyps were found in the transverse                            colon. The polyps were 4 to 6 mm in  size. These                            polyps were removed with a cold snare. Resection                            and retrieval were complete.                           Multiple small and large-mouthed diverticula were                            found from hepatic flexure to sigmoid colon.                           Internal hemorrhoids were found during                            retroflexion. The hemorrhoids were small. Complications:            No immediate complications. Estimated Blood Loss:     Estimated blood loss was minimal. Impression:               - One 12 mm polyp in the ascending colon, removed                            with a hot snare. Resected and retrieved.                           - Two 4 to 6 mm polyps in the transverse colon,                            removed with a cold snare. Resected and retrieved.                           - Moderate diverticulosis from hepatic  flexure to                            sigmoid colon.                           - Small internal hemorrhoids. Recommendation:           - Patient has a contact number available for                            emergencies. The signs and symptoms of potential                            delayed complications were discussed with the                            patient. Return to normal activities tomorrow.                            Written discharge instructions were provided to  the                            patient.                           - Resume previous diet.                           - Continue present medications.                           - Await pathology results.                           - Repeat colonoscopy is recommended for                            surveillance. The colonoscopy date will be                            determined after pathology results from today's                            exam become available for review.                           - No ibuprofen, naproxen, or other non-steroidal                            anti-inflammatory drugs for 2 weeks after polyp                            removal. Jerene Bears, MD 11/30/2017 2:16:15 PM This report has been signed electronically.

## 2017-11-30 NOTE — Progress Notes (Signed)
To recovery, report to RN, VSS. 

## 2017-11-30 NOTE — Patient Instructions (Signed)
No NSAIDS (Advil, Motrin, Ibuprofen, Aleve, Naprosyn, Etc. ) for two weeks., December 26,2018.  Handouts given : Polyps, Diverticulosis and Hemorrhoids.  YOU HAD AN ENDOSCOPIC PROCEDURE TODAY AT [facility]:   Refer to the procedure report that was given to you for any specific questions about what was found during the examination.  If the procedure report does not answer your questions, please call your gastroenterologist to clarify.  If you requested that your care partner not be given the details of your procedure findings, then the procedure report has been included in a sealed envelope for you to review at your convenience later.  YOU SHOULD EXPECT: Some feelings of bloating in the abdomen. Passage of more gas than usual.  Walking can help get rid of the air that was put into your GI tract during the procedure and reduce the bloating. If you had a lower endoscopy (such as a colonoscopy or flexible sigmoidoscopy) you may notice spotting of blood in your stool or on the toilet paper. If you underwent a bowel prep for your procedure, you may not have a normal bowel movement for a few days.  Please Note:  You might notice some irritation and congestion in your nose or some drainage.  This is from the oxygen used during your procedure.  There is no need for concern and it should clear up in a day or so.  SYMPTOMS TO REPORT IMMEDIATELY:   Following lower endoscopy (colonoscopy or flexible sigmoidoscopy):  Excessive amounts of blood in the stool  Significant tenderness or worsening of abdominal pains  Swelling of the abdomen that is new, acute  Fever of 100F or higher   For urgent or emergent issues, a gastroenterologist can be reached at any hour by calling [phone].   DIET:  We do recommend a small meal at first, but then you may proceed to your regular diet.  Drink plenty of fluids but you should avoid alcoholic beverages for 24 hours.  ACTIVITY:  You should plan to  take it easy for the rest of today and you should NOT DRIVE or use heavy machinery until tomorrow (because of the sedation medicines used during the test).    FOLLOW UP: Our staff will call the number listed on your records the next business day following your procedure to check on you and address any questions or concerns that you may have regarding the information given to you following your procedure. If we do not reach you, we will leave a message.  However, if you are feeling well and you are not experiencing any problems, there is no need to return our call.  We will assume that you have returned to your regular daily activities without incident.  If any biopsies were taken you will be contacted by phone or by letter within the next 1-3 weeks.  Please call us at [phone] if you have not heard about the biopsies in 3 weeks.    SIGNATURES/CONFIDENTIALITY: You and/or your care partner have signed paperwork which will be entered into your electronic medical record.  These signatures attest to the fact that that the information above on your After Visit Summary has been reviewed and is understood.  Full responsibility of the confidentiality of this discharge information lies with you and/or your care-partner.

## 2017-11-30 NOTE — Progress Notes (Signed)
Called to room to assist during endoscopic procedure.  Patient ID and intended procedure confirmed with present staff. Received instructions for my participation in the procedure from the performing physician.  

## 2017-12-01 ENCOUNTER — Telehealth: Payer: Self-pay | Admitting: *Deleted

## 2017-12-01 NOTE — Telephone Encounter (Signed)
  Follow up Call-  Call back number 11/30/2017  Post procedure Call Back phone  # [phone]  Permission to leave phone message Yes  Some recent data might be hidden     Patient questions:  Do you have a fever, pain , or abdominal swelling? No. Pain Score  0 *  Have you tolerated food without any problems? Yes.    Have you been able to return to your normal activities? Yes.    Do you have any questions about your discharge instructions: Diet   No. Medications  No. Follow up visit  No.  Do you have questions or concerns about your Care? No.  Actions: * If pain score is 4 or above: No action needed, pain <4.  Pt. Requested a copy of report with pictures on it. When questioned about her receiving report she stated 'I only got picture with diagram on, informed pt. That I will print out a copy of report and mail to her,she verbalize that it is fine to that.

## 2017-12-02 ENCOUNTER — Telehealth: Payer: Self-pay | Admitting: Internal Medicine

## 2017-12-05 ENCOUNTER — Encounter: Payer: Self-pay | Admitting: Internal Medicine

## 2017-12-05 NOTE — Telephone Encounter (Signed)
Colon report reviewed with pt and questions were answered.

## 2017-12-27 ENCOUNTER — Telehealth: Payer: Self-pay | Admitting: Podiatry

## 2017-12-27 NOTE — Telephone Encounter (Signed)
Pt called for results of the fluid taken in September. I told her I was not able to find the test results by normal venue, but would contact Dr. Jacqualyn Posey.

## 2017-12-27 NOTE — Telephone Encounter (Signed)
My name is [name] um my phone number is [phone] and my date of birth is [birth year]. Thank you.

## 2017-12-28 NOTE — Telephone Encounter (Addendum)
I reviewed Labs 09/12/2017 00:00 Results, Dr. Jacqualyn Posey stated the pathology was + for Ganglion. I informed pt of results.

## 2018-01-11 ENCOUNTER — Telehealth: Payer: Self-pay | Admitting: Internal Medicine

## 2018-01-11 MED ORDER — AZITHROMYCIN 250 MG PO TABS: ORAL_TABLET | ORAL | 0 refills | Status: AC

## 2018-01-11 MED ORDER — PREDNISONE 10 MG PO TABS: ORAL_TABLET | ORAL | 0 refills | Status: DC

## 2018-01-11 NOTE — Telephone Encounter (Signed)
Ok to refill her prednisone as directed  Offer Zpak 250 mg, # 6, 2 today then one daily

## 2018-01-11 NOTE — Telephone Encounter (Signed)
Called and spoke with pt. Pt feels that she has developed a cod that has flared her asthma.  Pt reports of sinus headache, sinus pressure, wheezing, nasal drainage yellow mucus & non prod cough x3d.  Denies fever, chills, sweats or body aches. Pt also request refill on Prednisone 10mg . Last refilled 08/23/17 #200  Pt has not taken any OTC medications.  Pt also requested albuterol HFA samples or coupons. Pt aware that we do not have coupons or samples.   CY please advise. Thanks.

## 2018-01-11 NOTE — Telephone Encounter (Signed)
Rx for prednisone and Zpak has been sen to preferred pharmacy.  Patient assistant forms for Proair has been placed up front for pt.  Pt is aware and voiced her understanding. Nothing further is needed.

## 2018-02-16 ENCOUNTER — Telehealth: Payer: Self-pay | Admitting: Internal Medicine

## 2018-02-16 ENCOUNTER — Ambulatory Visit: Payer: Self-pay | Admitting: Pulmonary Disease

## 2018-02-16 NOTE — Telephone Encounter (Signed)
[name]- can she see an Np if we don't have room

## 2018-02-16 NOTE — Telephone Encounter (Signed)
Spoke with patient. She is aware of CY's recs. She denied an appt with a NP. Patient stated that she will call back next week for an appt.

## 2018-02-16 NOTE — Telephone Encounter (Signed)
Spoke with patient. She wanted to cancel her appt with PM. She wants to come in and see CY. Advised patient that she already has an appt with CY on 04/10/18. She wants to be seen sooner. Advised patient that CY does not have thing available.   Has symptoms of wheezing, increased SOB, coughing up yellow phlegm for the past 4 days.   She wants to see what CY would recommend.   CY, please advise. Thanks!

## 2018-02-16 NOTE — Telephone Encounter (Signed)
Called pt who stated she has had complaints of wheezing, coughing, and postnasal drainage with yellow to green mucus that has been happening x4 days.  Pt denies any fever or body aches.  Pt stated to me she wanted to come in for a visit but Dr.  Annamaria Boots did not have any openings.  I scheduled pt for an appt with Dr. Vaughan Browner today at 3:45.  Pt stated to me if she was going to be unable to make that appt due to her being at work, she would call our office in advance if she needed to reschedule that appt.  Nothing further needed at this current time.

## 2018-02-22 ENCOUNTER — Other Ambulatory Visit: Payer: Self-pay | Admitting: Internal Medicine

## 2018-02-23 ENCOUNTER — Other Ambulatory Visit: Payer: Self-pay

## 2018-02-23 MED ORDER — ALBUTEROL SULFATE HFA 108 (90 BASE) MCG/ACT IN AERS: 2.0000 | INHALATION_SPRAY | Freq: Four times a day (QID) | RESPIRATORY_TRACT | 6 refills | Status: DC | PRN

## 2018-03-03 LAB — HM PAP SMEAR

## 2018-03-16 ENCOUNTER — Encounter: Payer: Self-pay | Admitting: Family

## 2018-03-31 ENCOUNTER — Telehealth: Payer: Self-pay | Admitting: Internal Medicine

## 2018-03-31 NOTE — Telephone Encounter (Signed)
Katie please advise 

## 2018-04-03 NOTE — Telephone Encounter (Signed)
Spoke with pt. None of the appointments I offered her worked with her schedule. States that she will call us back to make an appointment. Nothing further was needed.

## 2018-04-03 NOTE — Telephone Encounter (Signed)
Any RN slot or double book at 11:30am is fine. If patient prefers 4:30pm slot that is fine to use (no double booking in that slot). Thanks.

## 2018-04-10 ENCOUNTER — Ambulatory Visit: Payer: Self-pay | Admitting: Internal Medicine

## 2018-04-17 ENCOUNTER — Encounter: Payer: Self-pay | Admitting: Family

## 2018-04-17 ENCOUNTER — Ambulatory Visit (INDEPENDENT_AMBULATORY_CARE_PROVIDER_SITE_OTHER): Payer: BLUE CROSS/BLUE SHIELD | Admitting: Family

## 2018-04-17 ENCOUNTER — Ambulatory Visit: Payer: BLUE CROSS/BLUE SHIELD | Admitting: Family

## 2018-04-17 VITALS — BP 132/89 | HR 85 | Temp 98.4°F | Resp 16 | Ht 66.5 in | Wt 203.6 lb

## 2018-04-17 DIAGNOSIS — E119 Type 2 diabetes mellitus without complications: Secondary | ICD-10-CM | POA: Diagnosis not present

## 2018-04-17 DIAGNOSIS — R739 Hyperglycemia, unspecified: Secondary | ICD-10-CM

## 2018-04-17 DIAGNOSIS — M25519 Pain in unspecified shoulder: Secondary | ICD-10-CM | POA: Diagnosis not present

## 2018-04-17 DIAGNOSIS — L304 Erythema intertrigo: Secondary | ICD-10-CM | POA: Diagnosis not present

## 2018-04-17 MED ORDER — NYSTATIN 100000 UNIT/GM EX CREA: 1.0000 "application " | TOPICAL_CREAM | Freq: Two times a day (BID) | CUTANEOUS | 1 refills | Status: DC

## 2018-04-17 NOTE — Progress Notes (Signed)
Subjective:    Patient ID: [name], female    DOB: [birth year], 51 y.o.   MRN: [id number]  HPI  Patient is a 52 yr old female who presents today with two concerns:  1) skin rash- reports skin rash beneath breast. HAs been present for   2) Shoulder pain- reports right sided shoulder pain on/off.  Has been present x 2 months.    Review of Systems    see HPI  Past Medical History:  Diagnosis Date  . Allergic rhinitis, cause unspecified   . Asthma   . History of DVT (deep vein thrombosis) 2005   Pt reports blood clot below knee ?unsure which leg.  . Nasal polyps   . Unspecified essential hypertension      Social History   Socioeconomic History  . Marital status: Married    Spouse name: Not on file  . Number of children: Not on file  . Years of education: Not on file  . Highest education level: Not on file  Occupational History  . Occupation: Company secretary  Social Needs  . Financial resource strain: Not on file  . Food insecurity:    Worry: Not on file    Inability: Not on file  . Transportation needs:    Medical: Not on file    Non-medical: Not on file  Tobacco Use  . Smoking status: Never Smoker  . Smokeless tobacco: Never Used  . Tobacco comment: Never Used Tobacco  Substance and Sexual Activity  . Alcohol use: Yes    Alcohol/week: 0.0 oz    Comment: 1 glass of wine per year  . Drug use: No  . Sexual activity: Yes    Birth control/protection: None  Lifestyle  . Physical activity:    Days per week: Not on file    Minutes per session: Not on file  . Stress: Not on file  Relationships  . Social connections:    Talks on phone: Not on file    Gets together: Not on file    Attends religious service: Not on file    Active member of club or organization: Not on file    Attends meetings of clubs or organizations: Not on file    Relationship status: Not on file  . Intimate partner violence:    Fear of current or ex partner: Not on file   Emotionally abused: Not on file    Physically abused: Not on file    Forced sexual activity: Not on file  Other Topics Concern  . 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      Past Surgical History:  Procedure Laterality Date  . LASIK Bilateral 2008  . NASAL SINUS SURGERY  1245   Dr Erik Obey  . OVARIAN CYST REMOVAL  02/2016   pt reported-- Dr Philis Pique  . UTERINE FIBROID SURGERY  2005    Family History  Problem Relation Age of Onset  . Cancer Mother 64       ?carcinoid tumor (had surgical removal)  . Diabetes Mother        borderline  . Stomach cancer Mother   . Heart disease Father        died 62, from MI, had hx of ETOH abuse and liver disease  .  Thyroid cancer Sister        (diagnosed at 36) half sister (same dad)   . Diabetes type II Brother   . Kidney disease Neg Hx   . Hypertension Neg Hx   . Hyperlipidemia Neg Hx   . Colon cancer Neg Hx   . Esophageal cancer Neg Hx   . Rectal cancer Neg Hx     Allergies  Allergen Reactions  . Aspirin     wheezing  . Latex   . Penicillins Hives  . Shellfish Allergy     Current Outpatient Medications on File Prior to Visit  Medication Sig Dispense Refill  . albuterol (PROVENTIL HFA;VENTOLIN HFA) 108 (90 Base) MCG/ACT inhaler Inhale 2 puffs into the lungs every 6 (six) hours as needed for wheezing or shortness of breath. 1 Inhaler 6  . albuterol (PROVENTIL) (2.5 MG/3ML) 0.083% nebulizer solution Use 1 vial in nebulizer as needed dx:496 150 mL PRN  . albuterol (PROVENTIL) 2 MG tablet TAKE ONE TABLET BY MOUTH TWICE DAILY 60 tablet 12  . Albuterol Sulfate (PROAIR RESPICLICK) 295 (90 Base) MCG/ACT AEPB Inhale 2 puffs into the lungs 4 (four) times daily as needed. 1 each 12  . cetirizine (ZYRTEC) 10 MG tablet Take 10 mg by mouth daily.    [name] escitalopram (LEXAPRO) 5 MG  tablet 1 tab once daily for 1 week, then increase to 2 tabs once daily on week two 60 tablet 0  . fluticasone furoate-vilanterol (BREO ELLIPTA) 200-25 MCG/INH AEPB Inhale 1 puff into the lungs daily. Rinse mouth 60 each 12  . furosemide (LASIX) 20 MG tablet Take 1 tablet (20 mg total) by mouth daily. 30 tablet 5  . ipratropium (ATROVENT) 0.02 % nebulizer solution Use 1 vial in nebulizer as needed dx. 496 150 mL PRN  . potassium chloride (K-DUR) 10 MEQ tablet Take 2 tablets (20 mEq total) by mouth daily. 60 tablet 5  . predniSONE (DELTASONE) 10 MG tablet Take as directed. Pt takes as needed for asthma. 200 tablet 0  . Prenatal Vit-Fe Sulfate-FA (PRENATAL VITAMIN PO) Take 1 capsule by mouth daily.     No current facility-administered medications on file prior to visit.     BP 132/89 (BP Location: Right Arm, Patient Position: Sitting, Cuff Size: Large)   Pulse 85   Temp 98.4 F (36.9 C) (Oral)   Resp 16   Ht 5' 6.5" (1.689 m)   Wt 203 lb 9.6 oz (92.4 kg)   SpO2 97%   BMI 32.37 kg/m    Objective:   Physical Exam  Constitutional: She appears well-developed and well-nourished.  Cardiovascular: Normal rate, regular rhythm and normal heart sounds.  No murmur heard. Pulmonary/Chest: Effort normal and breath sounds normal. No respiratory distress. She has no wheezes.  Skin:  + erythematous rash noted beneath both breasts.   Psychiatric: She has a normal mood and affect. Her behavior is normal. Judgment and thought content normal.  MS- R shoulder, no swelling or warmth. + mild pain with abduction        Assessment & Plan:  [facility]- rx with nystatin cream.  R shoulder pain- suspect rtc strain. Discussed referral to sports med. She declines at this time. Can't take nsaids. Continue tylenol prn.

## 2018-04-17 NOTE — Patient Instructions (Signed)
Please apply nystatin cream beneath both breast twice times daily for 2 weeks. Call if rash worsens or fails to improve. Let me know if your shoulder pain worsens and we will set you up with sports medicine.

## 2018-04-18 LAB — BASIC METABOLIC PANEL
BUN: 15 mg/dL (ref 6–23)
CHLORIDE: 106 meq/L (ref 96–112)
CO2: 28 mEq/L (ref 19–32)
CREATININE: 0.74 mg/dL (ref 0.40–1.20)
Calcium: 9.3 mg/dL (ref 8.4–10.5)
GFR: 106.65 mL/min (ref >60.00)
Glucose, Bld: 94 mg/dL (ref 70–99)
POTASSIUM: 3.7 meq/L (ref 3.5–5.1)
Sodium: 142 mEq/L (ref 135–145)

## 2018-04-18 LAB — HEMOGLOBIN A1C: Hgb A1c MFr Bld: 6.9 % — ABNORMAL HIGH (ref 4.6–6.5)

## 2018-04-19 ENCOUNTER — Encounter: Payer: Self-pay | Admitting: Family

## 2018-05-11 ENCOUNTER — Telehealth: Payer: Self-pay | Admitting: Internal Medicine

## 2018-05-11 NOTE — Telephone Encounter (Signed)
Attempted to contact pt. I did not receive an answer. There was no option for me to leave a message. Will try back.  

## 2018-05-12 MED ORDER — PREDNISONE 10 MG PO TABS: ORAL_TABLET | ORAL | 0 refills | Status: DC

## 2018-05-12 NOTE — Telephone Encounter (Signed)
Spoke with the pt  She is c/o increased wheezing, chest tightness, and non prod cough x 1 wk  She states she feels more SOB than usual also  No f/c/s, CP, or other co's  She has been using her albuterol inhaler 3 x daily on average  I offered appt but she refused one for today, and wants to keep planned ov with CY for 06/19/18  She is asking for prednisone  Will forward to DOD since Dr Annamaria Boots is unavailable  Dr Halford Chessman, please advise, thanks!

## 2018-05-12 NOTE — Telephone Encounter (Signed)
Per TP: okay for pred taper (10mg  #20 4x2, 3x2, 2x2, 1x2) ov if no better ,keep appt in July w/t CY, mucinex dm twice daily as needed, fluids/rest, call for ov if not better or seek emergency help if symptoms worsen.  Called spoke with patient, advised of TP's recommendations as stated above.  Pt stated that she actually wanted the #200 tablets that CY typically fills for her.  TP not agreeable to this Rx.  Patient stated she is going out of town and needs her prednisone.  Advised pt it best to take the taper rx'd by TP and will route message to CY to address on Tuesday when he returns to the office.  Pt then stated that her insurance will not cover an early fill of prednisone.  Discussed with patient at length that this would be a NEW Rx and therefore should not be an issue with insurance.  Advised pt will call [facility] to check.  Called [facility] on [address], spoke with Santiago Glad >> pt does not have correct insurance on file but cash pay price of $9.09.  Asked Santiago Glad to please get this ready for pt.  Called spoke with patient, informed her of the price.  Pt still not satisfied that she will not be receiving her #200 tabs today.  Apologized to patient again.  Again advised pt it would be best to take this taper over the long holiday weekend and we can address her regular Rx when the office reopens on Tuesday.  Pt okay with this.  Pt would like Rx to be actually sent to the [facility] on [address].  Advised pt will do this and our office will call her Tuesday about the #200 tabs.  Pt voiced her understanding.  Called [facility] [address], spoke with pharmacist Wells Guiles >> Rx given verbally as they were unable to locate the Rx that was telephoned in earlier.  Med list updated  Dr Annamaria Boots, please advise if okay to fill patient's Rx script for prednisone 10mg  #200 tabs.  Thank you.

## 2018-05-16 MED ORDER — PREDNISONE 10 MG PO TABS: ORAL_TABLET | ORAL | 0 refills | Status: DC

## 2018-05-16 NOTE — Telephone Encounter (Signed)
rx sent to preferred pharmacy.  Pt aware.  Nothing further needed.  

## 2018-05-16 NOTE — Telephone Encounter (Signed)
Ok to give her refill # 200 as usuual

## 2018-05-23 ENCOUNTER — Telehealth: Payer: Self-pay | Admitting: Internal Medicine

## 2018-05-23 NOTE — Telephone Encounter (Signed)
Called pt but there was no answer and no VM to leave message. Will try again later.

## 2018-05-24 NOTE — Telephone Encounter (Signed)
Pt calling and requesting a Breo coupon.  One has been placed in outgoing mail to pt.  Nothing further needed.

## 2018-05-31 ENCOUNTER — Telehealth: Payer: Self-pay | Admitting: *Deleted

## 2018-05-31 NOTE — Telephone Encounter (Signed)
Copied from [place] [id number]. Topic: Quick Communication - Lab Results >> May 30, 2018  3:11 PM Scherrie Gerlach wrote: Pt would like results of labs done 4/29.  Pt states she does not get on mychart, and would like a call back

## 2018-05-31 NOTE — Telephone Encounter (Signed)
Spoke with pt and discussed 03/2018 lab results. Pt confirms she does not check mychart and I advised her I will deactivate her account and she is agreeable. After discussing results pt states she did not know she was diabetic. Advised her per verbal from PCP that she has been in the diet controlled range but if hgb a1c continues to increase, she will need to start on medication. Discussed dietary reminders with pt and she voices understanding. Pt has scheduled a follow up in November.

## 2018-06-19 ENCOUNTER — Ambulatory Visit (INDEPENDENT_AMBULATORY_CARE_PROVIDER_SITE_OTHER): Payer: BLUE CROSS/BLUE SHIELD | Admitting: Internal Medicine

## 2018-06-19 ENCOUNTER — Encounter: Payer: Self-pay | Admitting: Internal Medicine

## 2018-06-19 DIAGNOSIS — J3089 Other allergic rhinitis: Secondary | ICD-10-CM

## 2018-06-19 DIAGNOSIS — J4541 Moderate persistent asthma with (acute) exacerbation: Secondary | ICD-10-CM

## 2018-06-19 DIAGNOSIS — J302 Other seasonal allergic rhinitis: Secondary | ICD-10-CM | POA: Diagnosis not present

## 2018-06-19 NOTE — Patient Instructions (Signed)
Order- start Berna Bue application process, with patient education  Ok to continue current meds- please call for refills as needed, and please let us know if you aren't doing well, so we can try to help.

## 2018-06-19 NOTE — Progress Notes (Signed)
Patient ID: [name], female    DOB: 12-Feb-1966, 52 y.o.   MRN: [id number] F followed for moderate persistent asthma asthma (requiring frequent steroids , Failed Xolair, , hx nasal polyps, allergic rhinitis, hx DVT PFT- 09/09/11- FEV1 1.64/ 54%, FEV1/FVC 0.54, FEF25-75% 0.74/ 22%, insignif response to bronchodilator TLC 0.74, DLCO 72%. Moderate obstructive disease, mild restriction, mild reduction of DLCO. Office spirometry- mild obstructive airways disease. FEV1 1.95/73%, FVC 3.01/93%, FEV1/FVC 0.65, FEF 25-75% 1.26/38%.   ----------------------------------------------------------------- 10/07/17- 52 year old female never smoker followed for moderate persistent asthma (frequent steroids, failed Xolair), history nasal polyps, allergic rhinitis, history DVT --Asthma: Pt completed Zpak but continues to have chest congestion and wheezing.  Nebulizer machine, pro-air HFA, Proventil 2 mg tablet, Breo 100, prednisone 10 mg tablets taken if needed. Blames seasonal weather change in the last 2 weeks for triggering exacerbation.  She finished Z-Pak 2 days ago.  Green sputum cleared but she is still coughing productively with white.  Continues maintenance prednisone 10 mg every other day with last prednisone taper 1 month ago.  Steroid discussion again.  06/19/2018- 52 year old female never smoker followed for moderate persistent asthma (frequent steroids, failed Xolair), history nasal polyps, allergic rhinitis, history DVT -----Pt states she has been having a rough time with her asthma. States she has had 3 flare-ups since last visit and has to use her rescue inhaler at least 2 times a week. Pt has a mild cough, occ. SOB. Denies any complaints of CP. Eos 07/13/17- H- 7500 More frequent asthma flares-she does not recognize a reason why except may be "hot weather".  More frequent need for short doses of prednisone and increased use of rescue inhaler.  We discussed steroid use again.  She is willing to  retry a biologic and we are going to start [place]. CXR 10/07/2017 IMPRESSION: No active cardiopulmonary disease.  Review of Systems-See HPI     + = positive Constitutional:   No weight loss, night sweats,  Fevers, chills, fatigue, lassitude. HEENT:   No headaches,  Difficulty swallowing,  Tooth/dental problems,  Sore throat,                No sneezing, itching, ear ache,, + post nasal drip. + nasal congestion. CV:  No chest pain,  Orthopnea, PND, swelling in lower extremities, anasarca, dizziness, palpitations GI  No heartburn, indigestion, abdominal pain, nausea, vomiting, Resp:Usually some shortness of breath with exertion, not at rest.  No excess mucus, + productive cough,          + non-productive cough,  No coughing up of blood.   change in color of mucus.  + wheezing.   Skin: no rash or lesions. GU: . MS:  No joint pain, + swelling.   Neuro- nothing unusual Psych:  No change in mood or affect. No depression or anxiety.  No memory loss. Objective:   Physical Exam  General- Alert, Oriented, Affect-appropriate, Distress- none acute  Relaxed and conversational   + overweight Skin- rash-none, lesions- none, excoriation- none Lymphadenopathy- none Head- atraumatic            Eyes- Gross vision intact, PERRLA, conjunctivae clear secretions            Ears- Hearing, canals normal            Nose- Clear, No-Septal dev, mucus, , erosion, perforation.             Throat- Mallampati III-IV, mucosa -not red , drainage- none, tonsils- atrophic Neck- flexible , trachea midline,  no stridor , thyroid nl, carotid no bruit Chest - symmetrical excursion , unlabored           Heart/CV- RRR , no murmur , no gallop  , no rub, nl s1 s2                           - JVD- none , edema 1-2+, , stasis changes- none, varices- none           Lung-  wheeze+ trace,  unlabored, cough + , dullness-none, rub- none           Chest wall-  Abd-  Br/ Gen/ Rectal- Not done, not indicated Extrem- cyanosis- none,  clubbing, none, atrophy- none, strength- nl.  Neuro- grossly intact to observation

## 2018-06-26 ENCOUNTER — Telehealth: Payer: Self-pay | Admitting: Internal Medicine

## 2018-06-26 DIAGNOSIS — J4550 Severe persistent asthma, uncomplicated: Secondary | ICD-10-CM

## 2018-06-26 NOTE — Assessment & Plan Note (Signed)
She is not having increased rhinitis symptoms to parallel her worsening asthma control.

## 2018-06-26 NOTE — Assessment & Plan Note (Signed)
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 Plan-start Berna Bue

## 2018-06-26 NOTE — Telephone Encounter (Signed)
Enrollment form was filled out and faxed by [name] [facility] signed it). Pt's P/A was in my to do folder this morning. I filled it out , had CY sign it and fax it to [facility]. Will wait for approval or denial.

## 2018-06-27 NOTE — Telephone Encounter (Signed)
Will take up to 5 bus days.

## 2018-06-28 NOTE — Telephone Encounter (Signed)
I called [facility], Joellen Jersey said they needed more info on pt.. I faxed pt P/A 06/26/18 ES did not receive it. I called them back this morning to make sure I had the right fax #. This rep gave me the intake team' fax #: [phone]. I faxed it to them this morning as I was very late for our meeting last night.

## 2018-06-29 NOTE — Telephone Encounter (Signed)
Order- CBC w diff.  Tell her this needs to be drawn when she has been off prednisone for at least a week, and preferably 2 weeks.   Dx asthma severe persistent

## 2018-06-29 NOTE — Telephone Encounter (Signed)
Will forward to [facility] as she orders CY's labs.

## 2018-06-29 NOTE — Telephone Encounter (Signed)
Received denial today. pt's last CBC with Differential was Oct. of 2018, they need one within 6 weeks pervious to blood eosinophil count  or 6 wks. Prior to tx. Count needs to be 150 cells or greater. Will forward to [place] to place order and contact pt.

## 2018-06-29 NOTE — Telephone Encounter (Signed)
Will forward back to TS

## 2018-06-30 ENCOUNTER — Telehealth: Payer: Self-pay | Admitting: Internal Medicine

## 2018-06-30 NOTE — Telephone Encounter (Signed)
Spoke with patient. She is aware of the need to come by the office for the CBC. Patient stated she will stop the prednisone this weekend for this test. Order has been placed. Nothing else needed at time of call.

## 2018-07-03 NOTE — Telephone Encounter (Signed)
Noted-waiting for patient to complete CBC-diff prior to requesting appeal.

## 2018-07-05 ENCOUNTER — Ambulatory Visit: Payer: Self-pay | Admitting: Internal Medicine

## 2018-07-06 NOTE — Telephone Encounter (Signed)
CBC still has not been completed as of today.

## 2018-07-10 NOTE — Telephone Encounter (Signed)
Katie- We just need to be able to document her eosinophils

## 2018-07-10 NOTE — Telephone Encounter (Signed)
[name], patient needs to come in for lab work first-we are not able to complete request for Berna Bue without this. Thanks.

## 2018-07-10 NOTE — Telephone Encounter (Signed)
Attempted to call patient today regarding labs and appeal. Pt needs to have CBC with diff prior to appeal being completed. I did not receive an answer at time of call. I have left a voicemail message for pt to return call. X1

## 2018-07-10 NOTE — Telephone Encounter (Signed)
Patient returned called, CB is [phone]

## 2018-07-10 NOTE — Telephone Encounter (Signed)
I returned pt's call, her vm picked up. I couldn't leave a message b/c her mailbox was full. I'll call the girls up front to let them know to trasfer the call to me. Pt called back and I spoke with her. She was off pred. For 5 days but didn't go for blood test. Pt. Said she was told to go to another lab (didn't know where), it was my understanding she would get it drawn here. Katie and CY were both with pts. Will route to Joellen Jersey and CY for advice.

## 2018-07-10 NOTE — Telephone Encounter (Signed)
Attempted to call patient today regarding appeal, and labs need to be completed prior to appeal. CBC with diff was ordered on 06/30/18 still marked active/future I did not receive an answer at time of call. Was not able to leave a VM on machine as it was full X1 Will try again later

## 2018-07-11 NOTE — Telephone Encounter (Signed)
I spoke with pt 07/10/18, she had to take pred. Yesterday and she said she was told she had to get her bloodwork somewhere else. I told her the order had already been put in for her to come here. Katie confrimed CBC with Diff is to be done in our lab.  Called pt , told her where our lab is and that she can go anytime been 8:00 and 5:30. I advised her to call the lab and check their hours. Pt. Said she would get here this wk., "I promise". I explained to her we can't move forward until the bloodwork is completed. Pt understood. Will wait for lab report.

## 2018-07-12 ENCOUNTER — Other Ambulatory Visit (INDEPENDENT_AMBULATORY_CARE_PROVIDER_SITE_OTHER): Payer: BLUE CROSS/BLUE SHIELD

## 2018-07-12 DIAGNOSIS — J4550 Severe persistent asthma, uncomplicated: Secondary | ICD-10-CM

## 2018-07-12 LAB — CBC WITH DIFFERENTIAL/PLATELET
Basophils Absolute: 0.1 10*3/uL (ref 0.0–0.1)
Basophils Relative: 0.6 % (ref 0.0–3.0)
EOS PCT: 7 % — AB (ref 0.0–5.0)
Eosinophils Absolute: 0.8 10*3/uL — ABNORMAL HIGH (ref 0.0–0.7)
HCT: 42.3 % (ref 36.0–46.0)
HEMOGLOBIN: 13.6 g/dL (ref 12.0–15.0)
LYMPHS ABS: 2.6 10*3/uL (ref 0.7–4.0)
Lymphocytes Relative: 21.4 % (ref 12.0–46.0)
MCHC: 32.1 g/dL (ref 30.0–36.0)
MCV: 79.4 fl (ref 78.0–100.0)
MONO ABS: 0.9 10*3/uL (ref 0.1–1.0)
MONOS PCT: 7.2 % (ref 3.0–12.0)
Neutro Abs: 7.7 10*3/uL (ref 1.4–7.7)
Neutrophils Relative %: 63.8 % (ref 43.0–77.0)
Platelets: 319 10*3/uL (ref 150.0–400.0)
RBC: 5.33 Mil/uL — AB (ref 3.87–5.11)
RDW: 14.3 % (ref 11.5–15.5)
WBC: 12 10*3/uL — AB (ref 4.0–10.5)

## 2018-07-13 NOTE — Telephone Encounter (Signed)
Patient completed labs yesterday 07/12/2018 at 4:55pm Routing message to TS to advise

## 2018-07-13 NOTE — Telephone Encounter (Signed)
I filled out the appeal. I will fax it along with updated CBC with Diff., OV notes,Med list, and copy of ins. Card, as soon as CY signs the appeal. (Done) Appeal has been faxed. Will wait to hear verdict.

## 2018-07-18 NOTE — Telephone Encounter (Addendum)
Pt. Was approved! I'm calling in her Berna Bue rx to [facility] now.  To process her rx  will take up to 5 bus. Days. I asked the pharmacist if he could please expedite this. "Yes", I explained to him she has been having to use her rescue inhaler a lot more and also her nebulizer.  The pharmacist wanted to make sure she knows about the high co-pay $945.86. I gave her [employer]'s # to get co-pay assistance. Waiting to hear from [place], pt or [employer].

## 2018-07-18 NOTE — Telephone Encounter (Signed)
TS, please provide update on appeal

## 2018-07-20 ENCOUNTER — Other Ambulatory Visit: Payer: Self-pay | Admitting: Internal Medicine

## 2018-07-20 ENCOUNTER — Telehealth: Payer: Self-pay | Admitting: Internal Medicine

## 2018-07-20 MED ORDER — AZITHROMYCIN 250 MG PO TABS: ORAL_TABLET | ORAL | 0 refills | Status: AC

## 2018-07-20 NOTE — Telephone Encounter (Signed)
Spoke with the pt  She states she has noticed wheezing and cough with yellow sputum x 3 days  She denies any f/c/s, CP, chest tightness or other new co's  She refused ov  Wants something called in  Please advise thanks!  Allergies  Allergen Reactions  . Aspirin     wheezing  . Latex   . Penicillins Hives  . Shellfish Allergy    Current Outpatient Medications on File Prior to Visit  Medication Sig Dispense Refill  . albuterol (PROVENTIL HFA;VENTOLIN HFA) 108 (90 Base) MCG/ACT inhaler Inhale 2 puffs into the lungs every 6 (six) hours as needed for wheezing or shortness of breath. 1 Inhaler 6  . albuterol (PROVENTIL) (2.5 MG/3ML) 0.083% nebulizer solution Use 1 vial in nebulizer as needed dx:496 150 mL PRN  . albuterol (PROVENTIL) 2 MG tablet TAKE ONE TABLET BY MOUTH TWICE DAILY (Patient not taking: Reported on 06/19/2018) 60 tablet 12  . cetirizine (ZYRTEC) 10 MG tablet Take 10 mg by mouth daily.    [name] escitalopram (LEXAPRO) 5 MG tablet 1 tab once daily for 1 week, then increase to 2 tabs once daily on week two 60 tablet 0  . fluticasone furoate-vilanterol (BREO ELLIPTA) 200-25 MCG/INH AEPB Inhale 1 puff into the lungs daily. Rinse mouth 60 each 12  . furosemide (LASIX) 20 MG tablet Take 1 tablet (20 mg total) by mouth daily. 30 tablet 5  . ipratropium (ATROVENT) 0.02 % nebulizer solution Use 1 vial in nebulizer as needed dx. 496 150 mL PRN  . nystatin cream (MYCOSTATIN) Apply 1 application topically 2 (two) times daily. (Patient not taking: Reported on 06/19/2018) 30 g 1  . potassium chloride (K-DUR) 10 MEQ tablet Take 2 tablets (20 mEq total) by mouth daily. 60 tablet 5  . predniSONE (DELTASONE) 10 MG tablet Take 4 tabs for 2 days, then 3 tabs for 2 days, 2 tabs for 2 days, then 1 tab for 2 days, then stop. (Patient not taking: Reported on 06/19/2018) 20 tablet 0  . predniSONE (DELTASONE) 10 MG tablet Take as directed. Pt takes as needed for asthma. (Patient not taking: Reported on 06/19/2018) 200  tablet 0  . Prenatal Vit-Fe Sulfate-FA (PRENATAL VITAMIN PO) Take 1 capsule by mouth daily.     No current facility-administered medications on file prior to visit.

## 2018-07-20 NOTE — Telephone Encounter (Signed)
Spoke with pt. She is aware of CY's recommendation. Rx has been sent in. Nothing further was needed.  

## 2018-07-20 NOTE — Telephone Encounter (Signed)
Offer Zpak 250 mg, # 6, 2 today then one daily I think she has prednisone if needed

## 2018-07-21 ENCOUNTER — Telehealth: Payer: Self-pay | Admitting: Internal Medicine

## 2018-07-21 NOTE — Telephone Encounter (Signed)
This is a cont. of ph. Note 7/12 19. (I closed it too soon) Pt. Was able to get co-pay assistance from 07/19/18 to 12/19/18. Pt. Has a co-pay card thru [employer]. #: [id number] Benefit: $ 13,000 for [place], up to  $100 for admin. Fee for ea. Inj.. Rep asked me to fax them pt's ins. Info. I will do that Mon..(07/24/18)

## 2018-07-24 ENCOUNTER — Telehealth: Payer: Self-pay | Admitting: Internal Medicine

## 2018-07-24 MED ORDER — DOXYCYCLINE HYCLATE 100 MG PO TABS: 100.0000 mg | ORAL_TABLET | Freq: Two times a day (BID) | ORAL | 0 refills | Status: DC

## 2018-07-24 NOTE — Telephone Encounter (Signed)
Spoke with pt. She is aware of CY's recommendations. Rx has been sent in. Nothing further was needed. 

## 2018-07-24 NOTE — Telephone Encounter (Signed)
Called [facility] they said the pt needs to call and give Card #, Rx Group#, RxBIN, PCN. I called the pt and gave her the # and what info. They would ask her for.(SAA) Will wait for [facility] to call to set up del.[name]

## 2018-07-24 NOTE — Telephone Encounter (Signed)
Offer doxycycline 100 mg, # 14, 1 twice daily Keep well-hydrated Ok to use an otc cough and cold symptom treatment like Tylenol Cold and Sinus, or similar.

## 2018-07-24 NOTE — Telephone Encounter (Signed)
Spoke with pt. States that she still isn't feeling any better since calling in last week. Still reports cough and shortness of breath. Cough is worse at night time and is producing discolored mucus. Last week she was given a Zpack, states this normally helps but didn't this time. Denies chest tightness, wheezing or fever. Declined an appointment due to having not met her deductible yet. She is requesting something be sent in.  CY - please advise. Thanks.  Allergies  Allergen Reactions  . Aspirin     wheezing  . Latex   . Penicillins Hives  . Shellfish Allergy    Current Outpatient Medications on File Prior to Visit  Medication Sig Dispense Refill  . albuterol (PROVENTIL HFA;VENTOLIN HFA) 108 (90 Base) MCG/ACT inhaler Inhale 2 puffs into the lungs every 6 (six) hours as needed for wheezing or shortness of breath. 1 Inhaler 6  . albuterol (PROVENTIL) (2.5 MG/3ML) 0.083% nebulizer solution Use 1 vial in nebulizer as needed dx:496 150 mL PRN  . albuterol (PROVENTIL) 2 MG tablet TAKE ONE TABLET BY MOUTH TWICE DAILY (Patient not taking: Reported on 06/19/2018) 60 tablet 12  . azithromycin (ZITHROMAX Z-PAK) 250 MG tablet Take 2 tablets (500 mg) on  Day 1,  followed by 1 tablet (250 mg) once daily on Days 2 through 5. 6 each 0  . cetirizine (ZYRTEC) 10 MG tablet Take 10 mg by mouth daily.    [name] escitalopram (LEXAPRO) 5 MG tablet 1 tab once daily for 1 week, then increase to 2 tabs once daily on week two 60 tablet 0  . fluticasone furoate-vilanterol (BREO ELLIPTA) 200-25 MCG/INH AEPB Inhale 1 puff into the lungs daily. Rinse mouth 60 each 12  . furosemide (LASIX) 20 MG tablet Take 1 tablet (20 mg total) by mouth daily. 30 tablet 5  . ipratropium (ATROVENT) 0.02 % nebulizer solution Use 1 vial in nebulizer as needed dx. 496 150 mL PRN  . nystatin cream (MYCOSTATIN) Apply 1 application topically 2 (two) times daily. (Patient not taking: Reported on 06/19/2018) 30 g 1  . potassium chloride (K-DUR) 10  MEQ tablet Take 2 tablets (20 mEq total) by mouth daily. 60 tablet 5  . predniSONE (DELTASONE) 10 MG tablet Take 4 tabs for 2 days, then 3 tabs for 2 days, 2 tabs for 2 days, then 1 tab for 2 days, then stop. (Patient not taking: Reported on 06/19/2018) 20 tablet 0  . predniSONE (DELTASONE) 10 MG tablet Take as directed. Pt takes as needed for asthma. (Patient not taking: Reported on 06/19/2018) 200 tablet 0  . Prenatal Vit-Fe Sulfate-FA (PRENATAL VITAMIN PO) Take 1 capsule by mouth daily.     No current facility-administered medications on file prior to visit.

## 2018-07-25 ENCOUNTER — Telehealth: Payer: Self-pay | Admitting: Internal Medicine

## 2018-07-26 NOTE — Telephone Encounter (Signed)
Closed by mistake, just noticed pt. Had called too. I called her to see what she needed. Pt wrote down the wrong #.  She called [phone], [facility]'s # is: [phone]. Pt said she'd call that #  this time. Then [facility] should call us and set up del..  Will record the order in [place] [facility] text. Now, nothing further is needed.

## 2018-07-26 NOTE — Telephone Encounter (Signed)
Attempted to call patient today. I did not receive an answer at time of call. No VM option. X1

## 2018-07-26 NOTE — Telephone Encounter (Signed)
Pt is returning call. Cb is [phone]

## 2018-07-26 NOTE — Telephone Encounter (Signed)
I called [place] back, please refer to ph. Note 07/21/18 for updates. Nothing further needed.

## 2018-07-26 NOTE — Telephone Encounter (Signed)
Pt is calling back [phone]

## 2018-07-31 ENCOUNTER — Telehealth: Payer: Self-pay | Admitting: Internal Medicine

## 2018-07-31 NOTE — Telephone Encounter (Signed)
1 prefilled syringe Ordered date: 07/31/18 Shipping date: 07/31/18

## 2018-07-31 NOTE — Telephone Encounter (Addendum)
Pt called [facility] and gave them the necessary info.. Nothing further needed.

## 2018-08-01 NOTE — Telephone Encounter (Signed)
Arrival Date:08/01/18 Lot #: [id number] Exp date: 06/2019

## 2018-08-04 ENCOUNTER — Telehealth: Payer: Self-pay | Admitting: Internal Medicine

## 2018-08-04 NOTE — Telephone Encounter (Signed)
Called pt back, they told her that charge $115) was for pediatrics inj. Admin.Joellen Jersey spoke with pt. And told her she would have to talk to [facility]. Will route to Katie to f/u on Mon.. (I'm off)

## 2018-08-04 NOTE — Telephone Encounter (Signed)
Will route to [employer] to follow up

## 2018-08-08 NOTE — Telephone Encounter (Signed)
Routing message to Joellen Jersey for update on patient.

## 2018-08-09 ENCOUNTER — Telehealth: Payer: Self-pay | Admitting: Internal Medicine

## 2018-08-09 MED ORDER — EPINEPHRINE 0.3 MG/0.3ML IJ SOAJ: 0.3000 mg | Freq: Once | INTRAMUSCULAR | 11 refills | Status: DC

## 2018-08-09 NOTE — Telephone Encounter (Signed)
Called pt, issue was resolved. (in previous documentation)  Pt is concerned about how much she will have to pay for the administration for her injections. She can't pay $115 for ea shot. She has not met her out of pocket or deductible. Coinsurance is 20%. Pt is getting her Dupixent free thru [employer].. Pt said she'd take her 1st shot, then she's not sure what to do. I sent her Epi-Pen rx to [facility] on [address] (As requested by pt.) Made pt's appt. For tomorrow at 2:30. Nothing further needed at this time. 

## 2018-08-09 NOTE — Telephone Encounter (Signed)
Rx sent 

## 2018-08-09 NOTE — Telephone Encounter (Signed)
Spoke with Kathlee Nations, I told her that pt's ins co said the procedure code we use is  for peds. She said it is not for peds. Kathlee Nations" I'll call her ins. Co.  Will call pt and make her aware.

## 2018-08-10 ENCOUNTER — Ambulatory Visit (INDEPENDENT_AMBULATORY_CARE_PROVIDER_SITE_OTHER): Payer: BLUE CROSS/BLUE SHIELD

## 2018-08-10 DIAGNOSIS — J4551 Severe persistent asthma with (acute) exacerbation: Secondary | ICD-10-CM

## 2018-08-10 MED ORDER — BENRALIZUMAB 30 MG/ML ~~LOC~~ SOSY
30.0000 mg | PREFILLED_SYRINGE | Freq: Once | SUBCUTANEOUS | Status: AC
  Administered 2018-08-10: 30 mg via SUBCUTANEOUS

## 2018-08-10 NOTE — Progress Notes (Signed)
Documentation of medication administration and charges of Berna Bue have been completed by Desmond Dike, CMA based on the [facility] documentation sheet completed by Alroy Bailiff, who administered the medication.

## 2018-08-30 ENCOUNTER — Telehealth: Payer: Self-pay | Admitting: Internal Medicine

## 2018-08-30 NOTE — Telephone Encounter (Signed)
Arrival Date:08/30/18 Lot #: [id number] Exp date: 05/2020

## 2018-09-05 ENCOUNTER — Other Ambulatory Visit: Payer: Self-pay | Admitting: Internal Medicine

## 2018-09-12 ENCOUNTER — Telehealth: Payer: Self-pay | Admitting: Internal Medicine

## 2018-09-12 NOTE — Telephone Encounter (Signed)
Patient is calling to ask if she can change her appt time and if she has to stay this as long this time will route to [place]

## 2018-09-13 NOTE — Telephone Encounter (Signed)
Spoke with pt. She is wanting to move her injection appointment. States that she might be late her to appointment due to having another appointment. Pt wants to keep her appointment on the same day but move it back to 4:30pm. Advised her that [facility] does not have an opening at 4:30pm tomorrow. States that she will keep her appointment the way it is and will call if she can't make it. Nothing further was needed.

## 2018-09-14 ENCOUNTER — Ambulatory Visit: Payer: BLUE CROSS/BLUE SHIELD

## 2018-10-10 ENCOUNTER — Telehealth: Payer: Self-pay | Admitting: Internal Medicine

## 2018-10-10 NOTE — Telephone Encounter (Signed)
Rx for [name] had been put on hold due to pt being wrongly billed for a huge amt.. That was resolved. [facility] is reprocessing  her rx. ( Pt had refills left on this rx.) The rep I spoke to said she was sending her rx to the expediting team and they should be calling to get her permission to ship. Then they should call us to set up del.[name]

## 2018-10-11 NOTE — Telephone Encounter (Addendum)
Called [facility] this morning, rep said the expediting team should be calling pt anytime to get her permission to [place] and verify co-pay. I called pt to make her aware. She had a class at 10:00 that will go well into the afternoon. I text pt the # per her request. Hopefully they were ready to ask her permission to ship or at least to put it in a note in her file that she wants [facility] to ship her Berna Bue.

## 2018-10-12 ENCOUNTER — Telehealth: Payer: Self-pay | Admitting: Internal Medicine

## 2018-10-12 ENCOUNTER — Ambulatory Visit: Payer: Self-pay

## 2018-10-12 NOTE — Telephone Encounter (Signed)
[facility] called and got pt.'s Consent but didn't call me for confirmation. [name] is coming 10/17/18. Sch'd pt's appt. For that Fri. 10/20/18 at 4:00. I'll call [facility]  Tomorrow to make sure [name] will be here Tues.[name]

## 2018-10-12 NOTE — Telephone Encounter (Signed)
1 prefilled syringe Ordered date: 10/12/18 Shipping date: 10/16/18

## 2018-10-12 NOTE — Telephone Encounter (Signed)
I called [facility] again,  rx has been processed, [facility] just needs her permission to ship. Once they get it, they should call me to set up del.. I also called pt again to make her aware.

## 2018-10-16 NOTE — Telephone Encounter (Signed)
I called [facility] last week, med is coming in 10/17/18. I made [place] text to record when med comes in. Nothing further needed.

## 2018-10-17 NOTE — Telephone Encounter (Signed)
Arrival Date:10/17/18 Lot #: [id number] Exp date: 03/2019

## 2018-10-20 ENCOUNTER — Ambulatory Visit: Payer: BLUE CROSS/BLUE SHIELD

## 2018-10-20 ENCOUNTER — Ambulatory Visit: Payer: Self-pay

## 2018-10-23 ENCOUNTER — Encounter: Payer: Self-pay | Admitting: Family

## 2018-10-23 ENCOUNTER — Ambulatory Visit (INDEPENDENT_AMBULATORY_CARE_PROVIDER_SITE_OTHER): Payer: BLUE CROSS/BLUE SHIELD | Admitting: Family

## 2018-10-23 ENCOUNTER — Ambulatory Visit: Payer: Self-pay | Admitting: Family

## 2018-10-23 VITALS — BP 146/89 | HR 82 | Temp 98.0°F | Resp 18 | Ht 66.5 in | Wt 210.2 lb

## 2018-10-23 DIAGNOSIS — E119 Type 2 diabetes mellitus without complications: Secondary | ICD-10-CM | POA: Diagnosis not present

## 2018-10-23 DIAGNOSIS — I10 Essential (primary) hypertension: Secondary | ICD-10-CM | POA: Diagnosis not present

## 2018-10-23 MED ORDER — POTASSIUM CHLORIDE ER 10 MEQ PO TBCR: 20.0000 meq | EXTENDED_RELEASE_TABLET | Freq: Every day | ORAL | 5 refills | Status: DC

## 2018-10-23 MED ORDER — FUROSEMIDE 20 MG PO TABS: 20.0000 mg | ORAL_TABLET | Freq: Every day | ORAL | 5 refills | Status: DC

## 2018-10-23 NOTE — Patient Instructions (Addendum)
Please schedule a follow up diabetic eye exam.   Complete lab work prior to leaving.  Continue to work on [employer], exercise, weight loss.

## 2018-10-23 NOTE — Progress Notes (Signed)
Subjective:    Patient ID: [name], female    DOB: 1966-07-01, 53 y.o.   MRN: [id number]  HPI  Patient is a 52 yr old female who presents today for follow up.  DM2- patient is maintained on diet alone.  Continues to be on/off of steroids due to her asthma. Asthma is being managed by pulmonology.  Lab Results  Component Value Date   HGBA1C 6.9 (H) 04/17/2018   HGBA1C 6.7 (H) 07/13/2017   HGBA1C 6.5 04/12/2016   Lab Results  Component Value Date   LDLCALC 113 (H) 10/12/2016   CREATININE 0.74 04/17/2018   HTN-  Notes that she has not been compliant with lasix.      Review of Systems    see HPI  Past Medical History:  Diagnosis Date  . Allergic rhinitis, cause unspecified   . Asthma   . History of DVT (deep vein thrombosis) 2005   Pt reports blood clot below knee ?unsure which leg.  . Nasal polyps   . Unspecified essential hypertension      Social History   Socioeconomic History  . Marital status: Married    Spouse name: Not on file  . Number of children: Not on file  . Years of education: Not on file  . Highest education level: Not on file  Occupational History  . Occupation: Company secretary  Social Needs  . Financial resource strain: Not on file  . Food insecurity:    Worry: Not on file    Inability: Not on file  . Transportation needs:    Medical: Not on file    Non-medical: Not on file  Tobacco Use  . Smoking status: Never Smoker  . Smokeless tobacco: Never Used  . Tobacco comment: Never Used Tobacco  Substance and Sexual Activity  . Alcohol use: Yes    Alcohol/week: 0.0 standard drinks    Comment: 1 glass of wine per year  . Drug use: No  . Sexual activity: Yes    Birth control/protection: None  Lifestyle  . Physical activity:    Days per week: Not on file    Minutes per session: Not on file  . Stress: Not on file  Relationships  . Social connections:    Talks on phone: Not on file    Gets together: Not on file    Attends  religious service: Not on file    Active member of club or organization: Not on file    Attends meetings of clubs or organizations: Not on file    Relationship status: Not on file  . Intimate partner violence:    Fear of current or ex partner: Not on file    Emotionally abused: Not on file    Physically abused: Not on file    Forced sexual activity: Not on file  Other Topics Concern  . 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      Past Surgical History:  Procedure Laterality Date  . LASIK Bilateral 2008  . NASAL SINUS SURGERY  6314   Dr Erik Obey  . OVARIAN CYST REMOVAL  02/2016   pt reported-- Dr Philis Pique  . UTERINE FIBROID SURGERY  2005    Family History  Problem Relation Age of Onset  . Cancer Mother 30       ?  carcinoid tumor (had surgical removal)  . Diabetes Mother        borderline  . Stomach cancer Mother   . Heart disease Father        died 55, from MI, had hx of ETOH abuse and liver disease  . Thyroid cancer Sister        (diagnosed at 52) half sister (same dad)   . Diabetes type II Brother   . Kidney disease Neg Hx   . Hypertension Neg Hx   . Hyperlipidemia Neg Hx   . Colon cancer Neg Hx   . Esophageal cancer Neg Hx   . Rectal cancer Neg Hx     Allergies  Allergen Reactions  . Aspirin     wheezing  . Latex   . Penicillins Hives  . Shellfish Allergy     Current Outpatient Medications on File Prior to Visit  Medication Sig Dispense Refill  . albuterol (PROVENTIL HFA;VENTOLIN HFA) 108 (90 Base) MCG/ACT inhaler Inhale 2 puffs into the lungs every 6 (six) hours as needed for wheezing or shortness of breath. 1 Inhaler 6  . albuterol (PROVENTIL) (2.5 MG/3ML) 0.083% nebulizer solution Use 1 vial in nebulizer as needed dx:496 150 mL PRN  . cetirizine (ZYRTEC) 10 MG tablet Take 10 mg by mouth daily.     [name] FASENRA 30 MG/ML SOSY INJECT ONE SYRINGE (30 MG) UNDER THE SKIN AT WEEKS 0, 4, 8 THEN EVERY 8 WEEKS THEREAFTER 1 mL 13  . fluticasone furoate-vilanterol (BREO ELLIPTA) 200-25 MCG/INH AEPB Inhale 1 puff into the lungs daily. Rinse mouth 60 each 12  . furosemide (LASIX) 20 MG tablet Take 1 tablet (20 mg total) by mouth daily. 30 tablet 5  . ipratropium (ATROVENT) 0.02 % nebulizer solution Use 1 vial in nebulizer as needed dx. 496 150 mL PRN  . potassium chloride (K-DUR) 10 MEQ tablet Take 2 tablets (20 mEq total) by mouth daily. 60 tablet 5  . Prenatal Vit-Fe Sulfate-FA (PRENATAL VITAMIN PO) Take 1 capsule by mouth daily.    [name] EPINEPHrine 0.3 mg/0.3 mL IJ SOAJ injection Inject 0.3 mLs (0.3 mg total) into the muscle once for 1 dose. 1 Device 11   No current facility-administered medications on file prior to visit.     BP (!) 146/89 (BP Location: Right Arm, Cuff Size: Large)   Pulse 82   Temp 98 F (36.7 C) (Oral)   Resp 18   Ht 5' 6.5" (1.689 m)   Wt 210 lb 3.2 oz (95.3 kg)   LMP 06/22/2018   SpO2 91%   BMI 33.42 kg/m    Objective:   Physical Exam  Constitutional: She is oriented to person, place, and time. She appears well-developed and well-nourished.  Cardiovascular: Normal rate, regular rhythm and normal heart sounds.  No murmur heard. Pulmonary/Chest: Effort normal and breath sounds normal. No respiratory distress. She has no wheezes.  Musculoskeletal: She exhibits no edema.  Neurological: She is alert and oriented to person, place, and time.  Skin: Skin is warm and dry.  Psychiatric: She has a normal mood and affect. Her behavior is normal. Judgment and thought content normal.          Assessment & Plan:  HTN- uncontrolled. Restart lasix.  DM2- clinically stable, obtain follow up A1C, cmet, lipid panel. Discussed DM diet.

## 2018-10-24 ENCOUNTER — Telehealth: Payer: Self-pay | Admitting: Internal Medicine

## 2018-10-24 LAB — COMPREHENSIVE METABOLIC PANEL
ALK PHOS: 99 U/L (ref 39–117)
ALT: 15 U/L (ref 0–35)
AST: 13 U/L (ref 0–37)
Albumin: 3.7 g/dL (ref 3.5–5.2)
BUN: 10 mg/dL (ref 6–23)
CO2: 28 mEq/L (ref 19–32)
Calcium: 9 mg/dL (ref 8.4–10.5)
Chloride: 106 mEq/L (ref 96–112)
Creatinine, Ser: 0.82 mg/dL (ref 0.40–1.20)
GFR: 94.54 mL/min (ref >60.00)
GLUCOSE: 128 mg/dL — AB (ref 70–99)
POTASSIUM: 4.1 meq/L (ref 3.5–5.1)
Sodium: 141 mEq/L (ref 135–145)
Total Bilirubin: 0.3 mg/dL (ref 0.2–1.2)
Total Protein: 6.3 g/dL (ref 6.0–8.3)

## 2018-10-24 LAB — MICROALBUMIN / CREATININE URINE RATIO
CREATININE, U: 256.8 mg/dL
MICROALB UR: 1.5 mg/dL (ref 0.0–1.9)
Microalb Creat Ratio: 0.6 mg/g (ref 0.0–30.0)

## 2018-10-24 LAB — LIPID PANEL
CHOL/HDL RATIO: 3
Cholesterol: 179 mg/dL (ref 0–200)
HDL: 57.8 mg/dL (ref >39.00)
LDL Cholesterol: 103 mg/dL — ABNORMAL HIGH (ref 0–99)
NONHDL: 121.26
Triglycerides: 90 mg/dL (ref 0.0–149.0)
VLDL: 18 mg/dL (ref 0.0–40.0)

## 2018-10-24 LAB — HEMOGLOBIN A1C: Hgb A1c MFr Bld: 6.7 % — ABNORMAL HIGH (ref 4.6–6.5)

## 2018-10-24 NOTE — Telephone Encounter (Signed)
Called patient to remind her of upcoming OV with CY on 10/26/18 and to let her know we will be moving on 11/06/18. Patient stated she needs to cancel appointment because she doesn't need to come in and is out of town. Patient doesn't want to come in but is requesting more prednisone. Patient stated she has been wheezing and would like prednisone. Advised patient that she should keep her appointment for 10/26/18 or while I had her on the phone to reschedule. Patient stated unable to do that and she want's CY to know she would like more prednisone.   Let patient know that since it is after 1700 that this message will not be addressed until tomorrow 10/25/18.  CY please advise.  Allergies  Allergen Reactions  . Aspirin     wheezing  . Latex   . Penicillins Hives  . Shellfish Allergy    Current Outpatient Medications on File Prior to Visit  Medication Sig Dispense Refill  . albuterol (PROVENTIL HFA;VENTOLIN HFA) 108 (90 Base) MCG/ACT inhaler Inhale 2 puffs into the lungs every 6 (six) hours as needed for wheezing or shortness of breath. 1 Inhaler 6  . albuterol (PROVENTIL) (2.5 MG/3ML) 0.083% nebulizer solution Use 1 vial in nebulizer as needed dx:496 150 mL PRN  . cetirizine (ZYRTEC) 10 MG tablet Take 10 mg by mouth daily.    [name] EPINEPHrine 0.3 mg/0.3 mL IJ SOAJ injection Inject 0.3 mLs (0.3 mg total) into the muscle once for 1 dose. 1 Device 11  . FASENRA 30 MG/ML SOSY INJECT ONE SYRINGE (30 MG) UNDER THE SKIN AT WEEKS 0, 4, 8 THEN EVERY 8 WEEKS THEREAFTER 1 mL 13  . fluticasone furoate-vilanterol (BREO ELLIPTA) 200-25 MCG/INH AEPB Inhale 1 puff into the lungs daily. Rinse mouth 60 each 12  . furosemide (LASIX) 20 MG tablet Take 1 tablet (20 mg total) by mouth daily. 30 tablet 5  . ipratropium (ATROVENT) 0.02 % nebulizer solution Use 1 vial in nebulizer as needed dx. 496 150 mL PRN  . potassium chloride (K-DUR) 10 MEQ tablet Take 2 tablets (20 mEq total) by mouth daily. 60 tablet 5  . Prenatal  Vit-Fe Sulfate-FA (PRENATAL VITAMIN PO) Take 1 capsule by mouth daily.     No current facility-administered medications on file prior to visit.

## 2018-10-25 ENCOUNTER — Telehealth: Payer: Self-pay | Admitting: Internal Medicine

## 2018-10-25 MED ORDER — PREDNISONE 5 MG PO TABS: ORAL_TABLET | ORAL | 0 refills | Status: DC

## 2018-10-25 NOTE — Telephone Encounter (Signed)
Prednisone 5 mg, # 200     Take as directed for asthma

## 2018-10-25 NOTE — Telephone Encounter (Signed)
Called and spoke with pharmacist. They needed clarification on medication. Clarified to be taken as directed for asthma. Nothing further needed.

## 2018-10-25 NOTE — Telephone Encounter (Signed)
Pt is aware of below message and voiced her understanding. Rx for prednisone 5mg  has been sent to preferred pharmacy. Nothing further is needed.

## 2018-10-26 ENCOUNTER — Telehealth: Payer: Self-pay | Admitting: Internal Medicine

## 2018-10-26 ENCOUNTER — Other Ambulatory Visit: Payer: Self-pay | Admitting: Family

## 2018-10-26 ENCOUNTER — Ambulatory Visit: Payer: Self-pay | Admitting: Internal Medicine

## 2018-10-26 NOTE — Telephone Encounter (Addendum)
Spoke with pt, she states she always received her RX for a quantity of #200 for 4 dollars and now she is having to pay 12 dollars. I called the pharmacy and they stated it is for a 90 day supply so that is the reason it is 12 dollars. I explained to the patient that I could write it for 30 days but she wouldn't get 200 pills. Pt wants to keep the Rx as is and nothing further is needed.

## 2018-10-26 NOTE — Telephone Encounter (Signed)
Sugar is improved.  I would recommend that she start atorvastatin once daily to reduce her risk of heart attack/stroke.

## 2018-10-30 MED ORDER — ATORVASTATIN CALCIUM 10 MG PO TABS: 10.0000 mg | ORAL_TABLET | Freq: Every day | ORAL | 3 refills | Status: DC

## 2018-10-30 NOTE — Telephone Encounter (Signed)
Notified pt and she voices understanding and is agreeable to try medication. Rx sent.

## 2018-11-28 ENCOUNTER — Telehealth: Payer: Self-pay | Admitting: Internal Medicine

## 2018-11-28 MED ORDER — AZITHROMYCIN 250 MG PO TABS: ORAL_TABLET | ORAL | 0 refills | Status: DC

## 2018-11-28 NOTE — Telephone Encounter (Signed)
Offer Zpak 250 mg, # 6, 2 today then one daily  Suggest Mucinex-DM otc Tylenol if needed Stay well- hydrated

## 2018-11-28 NOTE — Telephone Encounter (Signed)
Called and spoke with pt stating to her the recs per CY. Pt expressed understanding. Verified pt's preferred pharmacy and sent zpak Rx in for pt. Nothing further needed.

## 2018-11-28 NOTE — Telephone Encounter (Signed)
Called and spoke with pt who stated symptoms began 11/27/18 of wheezing, sore throat, and cough with yellow mucus.  Pt denies any fever.  I advised pt that we should get her to come in for an appt since we have not seen her since 06/19/18 but pt is wanting to know if something could be called in to help with her symptoms instead of having to come in for an appt.  Dr. Annamaria Boots, please advise on this for pt.Thanks!  Allergies  Allergen Reactions  . Aspirin     wheezing  . Latex   . Penicillins Hives  . Shellfish Allergy      Current Outpatient Medications:  .  albuterol (PROVENTIL HFA;VENTOLIN HFA) 108 (90 Base) MCG/ACT inhaler, Inhale 2 puffs into the lungs every 6 (six) hours as needed for wheezing or shortness of breath., Disp: 1 Inhaler, Rfl: 6 .  albuterol (PROVENTIL) (2.5 MG/3ML) 0.083% nebulizer solution, Use 1 vial in nebulizer as needed dx:496, Disp: 150 mL, Rfl: PRN .  atorvastatin (LIPITOR) 10 MG tablet, Take 1 tablet (10 mg total) by mouth daily., Disp: 30 tablet, Rfl: 3 .  cetirizine (ZYRTEC) 10 MG tablet, Take 10 mg by mouth daily., Disp: , Rfl:  .  EPINEPHrine 0.3 mg/0.3 mL IJ SOAJ injection, Inject 0.3 mLs (0.3 mg total) into the muscle once for 1 dose., Disp: 1 Device, Rfl: 11 .  FASENRA 30 MG/ML SOSY, INJECT ONE SYRINGE (30 MG) UNDER THE SKIN AT WEEKS 0, 4, 8 THEN EVERY 8 WEEKS THEREAFTER, Disp: 1 mL, Rfl: 13 .  fluticasone furoate-vilanterol (BREO ELLIPTA) 200-25 MCG/INH AEPB, Inhale 1 puff into the lungs daily. Rinse mouth, Disp: 60 each, Rfl: 12 .  furosemide (LASIX) 20 MG tablet, Take 1 tablet (20 mg total) by mouth daily., Disp: 30 tablet, Rfl: 5 .  ipratropium (ATROVENT) 0.02 % nebulizer solution, Use 1 vial in nebulizer as needed dx. 496, Disp: 150 mL, Rfl: PRN .  potassium chloride (K-DUR) 10 MEQ tablet, Take 2 tablets (20 mEq total) by mouth daily., Disp: 60 tablet, Rfl: 5 .  predniSONE (DELTASONE) 5 MG tablet, Use as directed for asthma, Disp: 200 tablet, Rfl: 0 .   Prenatal Vit-Fe Sulfate-FA (PRENATAL VITAMIN PO), Take 1 capsule by mouth daily., Disp: , Rfl:

## 2018-12-19 ENCOUNTER — Telehealth: Payer: Self-pay | Admitting: Internal Medicine

## 2018-12-19 NOTE — Telephone Encounter (Signed)
Arrival Date:12/19/18 Lot #: [id number] Exp date: 07/2020  Pt ordered and had sent to [facility].

## 2018-12-22 ENCOUNTER — Telehealth: Payer: Self-pay | Admitting: Internal Medicine

## 2018-12-22 NOTE — Telephone Encounter (Signed)
Dr. Annamaria Boots has left. Routing to APP of day. Aaron Edelman, please advise recs for pt. Thanks!

## 2018-12-22 NOTE — Telephone Encounter (Signed)
Pt needs evaluation in office. Pt has had multiple telephone treatment of antibiotics without OV. I would recommend next week with an APP, Dr Annamaria Boots may not have openings at her preferred time. Pt can present to urgent care of er if worse.   Ensure she has Breo 200, Albuterol at home.   Wyn Quaker FNP

## 2018-12-22 NOTE — Telephone Encounter (Signed)
Called and spoke with patient.  She stated that she was prescribed a z pak a few weeks ago, and that worked, but it has come back.  Sh e stated that she has chest congestion and it is causing her to have asthma flare.She is requesting another Zpak, and a refill for prednisone sent to [facility] on [address]. She is also wanting to see if she can see Dr. Annamaria Boots, Thursday, 12/28/18.  She refused appointment with NP's.   Allergies  Allergen Reactions  . Aspirin     wheezing  . Latex   . Penicillins Hives  . Shellfish Allergy    Current Outpatient Medications on File Prior to Visit  Medication Sig Dispense Refill  . albuterol (PROVENTIL HFA;VENTOLIN HFA) 108 (90 Base) MCG/ACT inhaler Inhale 2 puffs into the lungs every 6 (six) hours as needed for wheezing or shortness of breath. 1 Inhaler 6  . albuterol (PROVENTIL) (2.5 MG/3ML) 0.083% nebulizer solution Use 1 vial in nebulizer as needed dx:496 150 mL PRN  . atorvastatin (LIPITOR) 10 MG tablet Take 1 tablet (10 mg total) by mouth daily. 30 tablet 3  . azithromycin (ZITHROMAX) 250 MG tablet Take two today and then one daily until finished 6 tablet 0  . cetirizine (ZYRTEC) 10 MG tablet Take 10 mg by mouth daily.    [name] EPINEPHrine 0.3 mg/0.3 mL IJ SOAJ injection Inject 0.3 mLs (0.3 mg total) into the muscle once for 1 dose. 1 Device 11  . FASENRA 30 MG/ML SOSY INJECT ONE SYRINGE (30 MG) UNDER THE SKIN AT WEEKS 0, 4, 8 THEN EVERY 8 WEEKS THEREAFTER 1 mL 13  . fluticasone furoate-vilanterol (BREO ELLIPTA) 200-25 MCG/INH AEPB Inhale 1 puff into the lungs daily. Rinse mouth 60 each 12  . furosemide (LASIX) 20 MG tablet Take 1 tablet (20 mg total) by mouth daily. 30 tablet 5  . ipratropium (ATROVENT) 0.02 % nebulizer solution Use 1 vial in nebulizer as needed dx. 496 150 mL PRN  . potassium chloride (K-DUR) 10 MEQ tablet Take 2 tablets (20 mEq total) by mouth daily. 60 tablet 5  . predniSONE (DELTASONE) 5 MG tablet Use as directed for asthma  200 tablet 0  . Prenatal Vit-Fe Sulfate-FA (PRENATAL VITAMIN PO) Take 1 capsule by mouth daily.     No current facility-administered medications on file prior to visit.

## 2018-12-22 NOTE — Telephone Encounter (Signed)
Thanks for following up.   [name]

## 2018-12-22 NOTE — Telephone Encounter (Signed)
Called and spoke with Patient.  Explained that Dr. Annamaria Boots was gone and Wyn Quaker ,NP took her message.  Brian's recommendations given.  Understanding stated. Patient stated that she would not go to urgent care or ED, because she would not want multiple bills.  Explained I would route her message to Dr. Annamaria Boots to follow up on Monday.  Allergies  Allergen Reactions  . Aspirin     wheezing  . Latex   . Penicillins Hives  . Shellfish Allergy    Current Outpatient Medications on File Prior to Visit  Medication Sig Dispense Refill  . albuterol (PROVENTIL HFA;VENTOLIN HFA) 108 (90 Base) MCG/ACT inhaler Inhale 2 puffs into the lungs every 6 (six) hours as needed for wheezing or shortness of breath. 1 Inhaler 6  . albuterol (PROVENTIL) (2.5 MG/3ML) 0.083% nebulizer solution Use 1 vial in nebulizer as needed dx:496 150 mL PRN  . atorvastatin (LIPITOR) 10 MG tablet Take 1 tablet (10 mg total) by mouth daily. 30 tablet 3  . azithromycin (ZITHROMAX) 250 MG tablet Take two today and then one daily until finished 6 tablet 0  . cetirizine (ZYRTEC) 10 MG tablet Take 10 mg by mouth daily.    [name] EPINEPHrine 0.3 mg/0.3 mL IJ SOAJ injection Inject 0.3 mLs (0.3 mg total) into the muscle once for 1 dose. 1 Device 11  . FASENRA 30 MG/ML SOSY INJECT ONE SYRINGE (30 MG) UNDER THE SKIN AT WEEKS 0, 4, 8 THEN EVERY 8 WEEKS THEREAFTER 1 mL 13  . fluticasone furoate-vilanterol (BREO ELLIPTA) 200-25 MCG/INH AEPB Inhale 1 puff into the lungs daily. Rinse mouth 60 each 12  . furosemide (LASIX) 20 MG tablet Take 1 tablet (20 mg total) by mouth daily. 30 tablet 5  . ipratropium (ATROVENT) 0.02 % nebulizer solution Use 1 vial in nebulizer as needed dx. 496 150 mL PRN  . potassium chloride (K-DUR) 10 MEQ tablet Take 2 tablets (20 mEq total) by mouth daily. 60 tablet 5  . predniSONE (DELTASONE) 5 MG tablet Use as directed for asthma 200 tablet 0  . Prenatal Vit-Fe Sulfate-FA (PRENATAL VITAMIN PO) Take 1 capsule by mouth daily.      No current facility-administered medications on file prior to visit.

## 2018-12-25 ENCOUNTER — Telehealth: Payer: Self-pay | Admitting: Internal Medicine

## 2018-12-25 MED ORDER — AZITHROMYCIN 250 MG PO TABS: ORAL_TABLET | ORAL | 0 refills | Status: DC

## 2018-12-25 MED ORDER — PREDNISONE 5 MG PO TABS: ORAL_TABLET | ORAL | 0 refills | Status: DC

## 2018-12-25 NOTE — Telephone Encounter (Signed)
Called and spoke with pt stating to her that we could get her scheduled with CY Thursday and pt expressed understanding. OV has been scheduled for pt with CY Thursday, 12/28/18 at 4:30.  Pt stated that she wanted to go ahead and have meds sent in to help her prior to the OV. I clarified pt's preferred pharmacy and sent zpak abx in. Pt also stated she wanted a refill of her prednisone Rx that was recently sent in. The Rx on file is for prednisone 5mg  tabs with a quantity of 200tabs. I have sent a refill of this to pt's pharmacy as well. Nothing further needed.

## 2018-12-25 NOTE — Telephone Encounter (Signed)
Pt can be seen in any RNA slot or held slot on schedule. Thanks.

## 2018-12-25 NOTE — Telephone Encounter (Signed)
Offer Zpak 250 mg, # 6 2 today then one daily           Prednisone-does she want Rx for taper, or refill of last stock supply of 10 mg tabs?   Either is ok  Joellen Jersey- please see if she still wants to be seen 1/9, or as able.

## 2018-12-25 NOTE — Telephone Encounter (Signed)
Spoke with pharmacy and clarified prednisone rx.  Nothing further needed at this time.

## 2018-12-28 ENCOUNTER — Encounter: Payer: Self-pay | Admitting: Internal Medicine

## 2018-12-28 ENCOUNTER — Ambulatory Visit: Payer: Self-pay

## 2018-12-28 ENCOUNTER — Ambulatory Visit (INDEPENDENT_AMBULATORY_CARE_PROVIDER_SITE_OTHER): Payer: BLUE CROSS/BLUE SHIELD | Admitting: Internal Medicine

## 2018-12-28 VITALS — BP 126/84 | HR 109 | Ht 66.5 in | Wt 207.4 lb

## 2018-12-28 DIAGNOSIS — J4541 Moderate persistent asthma with (acute) exacerbation: Secondary | ICD-10-CM | POA: Diagnosis not present

## 2018-12-28 DIAGNOSIS — J449 Chronic obstructive pulmonary disease, unspecified: Secondary | ICD-10-CM | POA: Diagnosis not present

## 2018-12-28 MED ORDER — FLUTICASONE-UMECLIDIN-VILANT 100-62.5-25 MCG/INH IN AEPB: 1.0000 | INHALATION_SPRAY | Freq: Every day | RESPIRATORY_TRACT | 0 refills | Status: DC

## 2018-12-28 MED ORDER — LEVALBUTEROL HCL 0.63 MG/3ML IN NEBU
0.6300 mg | INHALATION_SOLUTION | Freq: Once | RESPIRATORY_TRACT | Status: AC
  Administered 2018-12-28: 0.63 mg via RESPIRATORY_TRACT

## 2018-12-28 MED ORDER — METHYLPREDNISOLONE ACETATE 80 MG/ML IJ SUSP
80.0000 mg | Freq: Once | INTRAMUSCULAR | Status: AC
  Administered 2018-12-28: 80 mg via INTRAMUSCULAR

## 2018-12-28 NOTE — Assessment & Plan Note (Signed)
Discussed interaction of steroids with body weight and blood sugar.

## 2018-12-28 NOTE — Progress Notes (Signed)
Patient ID: [name], female    DOB: 1966-08-19, 53 y.o.   MRN: [id number] F followed for moderate persistent asthma with COPD (requiring frequent steroids , Failed Xolair, , hx nasal polyps, allergic rhinitis, hx DVT PFT- 09/09/11- FEV1 1.64/ 54%, FEV1/FVC 0.54, FEF25-75% 0.74/ 22%, insignif response to bronchodilator TLC 0.74, DLCO 72%. Moderate obstructive disease, mild restriction, mild reduction of DLCO. Office spirometry- mild obstructive airways disease. FEV1 1.95/73%, FVC 3.01/93%, FEV1/FVC 0.65, FEF 25-75% 1.26/38%.   ----------------------------------------------------------------- 06/19/2018- 53 year old female never smoker followed for moderate persistent asthma (frequent steroids, failed Xolair), history nasal polyps, allergic rhinitis, history DVT -----Pt states she has been having a rough time with her asthma. States she has had 3 flare-ups since last visit and has to use her rescue inhaler at least 2 times a week. Pt has a mild cough, occ. SOB. Denies any complaints of CP. Eos 07/13/17- H- 7500 More frequent asthma flares-she does not recognize a reason why except may be "hot weather".  More frequent need for short doses of prednisone and increased use of rescue inhaler.  We discussed steroid use again.  She is willing to retry a biologic and we are going to start [place]. CXR 10/07/2017 IMPRESSION: No active cardiopulmonary disease.  12/28/2018- 53 year old female never smoker followed for moderate persistent asthma with COPD (frequent steroids, failed Xolair), history nasal polyps, allergic rhinitis, history DVT -----Asthma flare up since last friday.  Albuterol HFA, neb albuterol, EpiPen, Breo 200, neb Atrovent, prednisone 5 mg, Fasenra Now on Z-Pak and prednisone taper again for current flare. Feels tight. Says when she started [place] monthly she didn't need steroids for a long time, but since going to every 2 months she has been less well controlled. Not clear how much  of this is a seasonal/ weather issue as well. Steroid side effects including impact on blood sugar discussed.   // consider updating PFT//  Review of Systems-See HPI     + = positive Constitutional:   No weight loss, night sweats,  Fevers, chills, fatigue, lassitude. HEENT:   No headaches,  Difficulty swallowing,  Tooth/dental problems,  Sore throat,                No sneezing, itching, ear ache,, + post nasal drip. + nasal congestion. CV:  No chest pain,  Orthopnea, PND, swelling in lower extremities, anasarca, dizziness, palpitations GI  No heartburn, indigestion, abdominal pain, nausea, vomiting, Resp:Usually some shortness of breath with exertion, not at rest.  No excess mucus, + productive cough,          + non-productive cough,  No coughing up of blood.   change in color of mucus.  + wheezing.   Skin: no rash or lesions. GU: . MS:  No joint pain, + swelling.   Neuro- nothing unusual Psych:  No change in mood or affect. No depression or anxiety.  No memory loss. Objective:   Physical Exam General- Alert, Oriented, Affect-appropriate, Distress- none acute  Relaxed and conversational   + overweight, + pink cheeks/ steroid facies Skin- rash-none, lesions- none, excoriation- none Lymphadenopathy- none Head- atraumatic            Eyes- Gross vision intact, PERRLA, conjunctivae clear secretions            Ears- Hearing, canals normal            Nose- Clear, No-Septal dev, mucus, , erosion, perforation.             Throat- Mallampati III-IV,  mucosa -not red , drainage- none, tonsils- atrophic Neck- flexible , trachea midline, no stridor , thyroid nl, carotid no bruit Chest - symmetrical excursion , unlabored           Heart/CV- RRR , no murmur , no gallop  , no rub, nl s1 s2                           - JVD- none , edema 1-2+ , stasis changes- none, varices- none           Lung-  wheeze+ mild I&E,  unlabored, cough- none , dullness-none, rub- none           Chest wall-  Abd-  Br/ Gen/  Rectal- Not done, not indicated Extrem- cyanosis- none, clubbing, none, atrophy- none, strength- nl.  Neuro- grossly intact to observation

## 2018-12-28 NOTE — Patient Instructions (Signed)
Order- neb xop 0.63    Dx exacerbation asthma with COPD              Depo 80  Sample x 2 Trelegy inhaler    Try this instead of Breo. When you run out of the sample,  Either call us for script or go back on Breo       Inhale 1 puff then rinse mouth, once daily  See Alroy Bailiff about [place] application

## 2018-12-28 NOTE — Assessment & Plan Note (Signed)
She feels her control was better and significantly steroid sparing when she was getting Fasenra monthly.  Control seems poor with Berna Bue given every 2 months. Plan-we will explore monthly Fasenra.  Finish current Z-Pak and current steroid taper.  Try samples of Trelegy instead of Breo.

## 2018-12-28 NOTE — Assessment & Plan Note (Signed)
Current exacerbation most likely viral.  She has responded to weather and season changes at times in the past.  I doubt bacterial infection.  She will finish her Z-Pak.

## 2019-01-04 MED ORDER — BENRALIZUMAB 30 MG/ML ~~LOC~~ SOSY
30.0000 mg | PREFILLED_SYRINGE | Freq: Once | SUBCUTANEOUS | Status: AC
  Administered 2018-12-28: 30 mg via SUBCUTANEOUS

## 2019-01-04 NOTE — Addendum Note (Signed)
Addended by: Lorane Gell on: 01/04/2019 09:41 AM   Modules accepted: Orders

## 2019-01-22 ENCOUNTER — Ambulatory Visit: Payer: Self-pay | Admitting: Family

## 2019-02-05 ENCOUNTER — Telehealth: Payer: Self-pay | Admitting: Internal Medicine

## 2019-02-05 NOTE — Telephone Encounter (Signed)
Called and spoke with patient. She states she only wants Tammy Scott to call her back. Patient has multiple questions about her injections.  Patient did not want to provide me with any details and will wait for Tammy to call her back. Patient's call back is [phone]  Will route to TS to follow up on

## 2019-02-05 NOTE — Telephone Encounter (Signed)
Pt requesting to speak to CY's nurse regarding records that needs to be sent to her other doctor's office regarding her injections.

## 2019-02-08 NOTE — Telephone Encounter (Signed)
[name], any updates? 

## 2019-02-09 NOTE — Telephone Encounter (Signed)
Called pt, had to leave a message, call went straight to vm. Waiting for pt to call me back.

## 2019-02-13 NOTE — Telephone Encounter (Signed)
I called pt again today, straight to vm again. Lm with vm, telling pt [facility] Has been trying to reach her to get her consent to ship. Gave pt their #, hopefully she will call them and give her consent soon.

## 2019-03-08 LAB — HM MAMMOGRAPHY

## 2019-03-09 NOTE — Telephone Encounter (Signed)
Pt states she only wants Tammy Scott to call her back. Patient has multiple questions about her injections.  Patient did not want to provide me with any details and will wait for Tammy to call her back. Patient's call back is [phone]  Will route to TS to follow up on

## 2019-03-13 ENCOUNTER — Telehealth: Payer: Self-pay | Admitting: *Deleted

## 2019-03-13 NOTE — Telephone Encounter (Signed)
I spoke with pt. Last Fri.. She does want to see about getting her shots q 6wks. Her sx are breaking through before 8 wks. Has a chance to get here. Pt is coming for her Fasenra at 10:30 tomorrow (03/14/2019), could CY possibily talk to pt. Then? Routing to [place].

## 2019-03-13 NOTE — Telephone Encounter (Signed)
Ok, thank you Dr. Annamaria Boots. I'll tell pt Tomorrow when she comes in for her Berna Bue shot. Routing this to you and Janeth Rase., to make you both aware, I'm going to relay your message to the pt..  I'll leave the encounter open, so the decision can be recorded.

## 2019-03-13 NOTE — Telephone Encounter (Signed)
I will try to fill out the form to get her Fasenra shots every 6 weeks, but can't promise that insurance will agree.  I think [name] has that form for me.

## 2019-03-14 ENCOUNTER — Other Ambulatory Visit: Payer: Self-pay | Admitting: Internal Medicine

## 2019-03-14 ENCOUNTER — Ambulatory Visit (INDEPENDENT_AMBULATORY_CARE_PROVIDER_SITE_OTHER): Payer: BLUE CROSS/BLUE SHIELD

## 2019-03-14 ENCOUNTER — Other Ambulatory Visit: Payer: Self-pay

## 2019-03-14 DIAGNOSIS — J4551 Severe persistent asthma with (acute) exacerbation: Secondary | ICD-10-CM | POA: Diagnosis not present

## 2019-03-14 MED ORDER — BENRALIZUMAB 30 MG/ML ~~LOC~~ SOSY
30.0000 mg | PREFILLED_SYRINGE | Freq: Once | SUBCUTANEOUS | Status: AC
  Administered 2019-03-14: 30 mg via SUBCUTANEOUS

## 2019-03-14 MED ORDER — BENRALIZUMAB 30 MG/ML ~~LOC~~ SOSY: 30.0000 mg | PREFILLED_SYRINGE | SUBCUTANEOUS | 20 refills | Status: DC

## 2019-03-14 NOTE — Telephone Encounter (Signed)
I faxed the form and script this morning. Pt is aware we sent the form in asking ins. To change the frequency to 6 wks instead of 8 wks.. Pt sx are breaking through before wk.7&8 Waiting to hear if the ins. Agrees. I'll call pt once we get a fax or ph. Call from her ins. (a decision) and let her know what they decided. Routing to [place] an FYI.

## 2019-03-14 NOTE — Telephone Encounter (Signed)
1 prefilled syringe Ordered date: 03/12/2019 Shipping date: 03/13/2019

## 2019-03-14 NOTE — Telephone Encounter (Signed)
I have signed form and rewritten script- We will bring it to you.

## 2019-03-16 NOTE — Telephone Encounter (Signed)
Arrival Date:3/35/2020 Lot #:  [id number] Exp date: 06/2020 Didn't get a chance to put arrival in.

## 2019-03-20 NOTE — Telephone Encounter (Signed)
TS, have you heard back from the insurance regarding decision on changing the injections from 8weeks to 6 weeks? Please advise, thank you.

## 2019-03-26 NOTE — Telephone Encounter (Signed)
[place] at [phone]. I was advised that they did receive the fax that Alroy Bailiff sent them on 03/14/2019. Pt's prescription was updated to reflect the change.

## 2019-04-04 ENCOUNTER — Telehealth: Payer: Self-pay | Admitting: Internal Medicine

## 2019-04-04 MED ORDER — FLUTICASONE-UMECLIDIN-VILANT 100-62.5-25 MCG/INH IN AEPB: 1.0000 | INHALATION_SPRAY | Freq: Every day | RESPIRATORY_TRACT | 5 refills | Status: DC

## 2019-04-04 MED ORDER — PREDNISONE 5 MG PO TABS: ORAL_TABLET | ORAL | 0 refills | Status: DC

## 2019-04-04 NOTE — Telephone Encounter (Signed)
Spoke with the pt  She prefers the trelegy over breo  Wants rx sent to pharm  Also needing prednisone refilled  Rxs were sent

## 2019-04-05 ENCOUNTER — Encounter: Payer: Self-pay | Admitting: Internal Medicine

## 2019-04-06 ENCOUNTER — Other Ambulatory Visit: Payer: Self-pay | Admitting: Family

## 2019-04-09 NOTE — Telephone Encounter (Signed)
30 day supply sent of med. Please schedule to arrange OV.

## 2019-04-11 NOTE — Telephone Encounter (Signed)
Patient advised rx was sent and she was due for follow up, she is going to call back to schedule.

## 2019-04-17 ENCOUNTER — Telehealth: Payer: Self-pay | Admitting: Internal Medicine

## 2019-04-17 MED ORDER — PREDNISONE 10 MG PO TABS: ORAL_TABLET | ORAL | 0 refills | Status: DC

## 2019-04-17 MED ORDER — ALBUTEROL SULFATE HFA 108 (90 BASE) MCG/ACT IN AERS: 2.0000 | INHALATION_SPRAY | Freq: Four times a day (QID) | RESPIRATORY_TRACT | 6 refills | Status: DC | PRN

## 2019-04-17 MED ORDER — FLUTICASONE FUROATE-VILANTEROL 200-25 MCG/INH IN AEPB: 1.0000 | INHALATION_SPRAY | Freq: Every day | RESPIRATORY_TRACT | 12 refills | Status: DC

## 2019-04-17 NOTE — Telephone Encounter (Signed)
Offer prednisone 10 mg, # 20, 4 X 2 DAYS, 3 X 2 DAYS, 2 X 2 DAYS, 1 X 2 DAYS  Suggest Sudafed from pharmacy counter as a decongestant

## 2019-04-17 NOTE — Telephone Encounter (Signed)
Attempted to call Tamirah with [facility] but was on hold for 10 minutes and was never able to speak with someone. Which medicine are they talking about? Is it pt's biologic injection that they want to know in regards if we are enrolled in access 360? Will try to call back later.

## 2019-04-17 NOTE — Telephone Encounter (Signed)
Called and spoke with pt letting her know the information per CY and stated to her we were going to send in another pred taper to pharmacy for her. Pt expressed understanding and stated she was needing a refill of breo and also her albuterol inhaler. I have sent refills of both of those meds to pharmacy for pt.  While speaking to pt, pt stated she was needing to speak to Tammy S in regards to her Fasenra injections. Routing to Tammy S for her to follow up on with pt.

## 2019-04-17 NOTE — Telephone Encounter (Signed)
Call returned to patient, she states her asthma has been acting up. She states its worse at night when she lays down. She has to mouth breath at night. She has a runny nose and sore throat. Reports congestions and denies cough. She states she has been having these symptoms for 3 days. She states she does have bad allergies. She does not have any Trelegy due to the cost. She is taking zyrtec daily but does not feel that it is helping. Requested a refill of Breo, will send in.   Allergies:  Aspirin   Not Specified Allergy    wheezing    Latex   Not Specified  11/23/2017   Penicillins  Hives Not Specified Allergy    Shellfish Allergy         CY please advise, requesting something be called in.

## 2019-04-18 ENCOUNTER — Telehealth: Payer: Self-pay | Admitting: Internal Medicine

## 2019-04-18 MED ORDER — ALBUTEROL SULFATE HFA 108 (90 BASE) MCG/ACT IN AERS: 2.0000 | INHALATION_SPRAY | Freq: Four times a day (QID) | RESPIRATORY_TRACT | 6 refills | Status: DC | PRN

## 2019-04-18 NOTE — Telephone Encounter (Signed)
Patient is wanting to speak to [facility]. Message will be routed back to her.

## 2019-04-18 NOTE — Telephone Encounter (Signed)
Called pt to let her know it is not time for her Berna Bue until 04/25/2019. Pt gave her consent but [facility] has not called [place] to set up ship.. I told her I would call [facility] and order her Berna Bue since she's already given her consent. Pt went to pick up Bero & Albuterol inhaler and they were not ready. Raquel Sarna called in both inhalers 04/17/2019.) Pt is requesting coupons and or discounts for Bero, or if we have any samples to give. I didn't mention samples the pt did. Pt wants a nurse to call her back about the inhalers. I transferred her up front to leave a message.

## 2019-04-18 NOTE — Telephone Encounter (Signed)
Pt has questions regarding her Fasenra injections. Routing message to injection pool for f/u with pt.

## 2019-04-18 NOTE — Telephone Encounter (Signed)
Per Alroy Bailiff, pt is wanting her medication ordered. I was given the information from Tammy to order the pt's medication. This should be ordered through [place]. Called [facility] and was told that the pt's speciality pharmacy was changed to [place].  Berna Bue Order: 30mg  #1 prefilled syringe Ordered date: 04/18/2019 Expected date of arrival: 04/20/2019 Ordered by: Desmond Dike, [place]: [place]

## 2019-04-18 NOTE — Telephone Encounter (Signed)
Called and spoke with patient regarding refill request for albuterol rescue inhaler Placed order today for refill to [place] of choice Pt is wanting to get trelegy instead of Breo; but trelegy is very expensive Offered patient assistance forms for trelegy for patient today She advised to mail this to her today Printed the forms and placed them in the mail today for patient Advised this is a lengthily process to be approved, she verbalized understanding Nothing further needed at this time.

## 2019-04-18 NOTE — Telephone Encounter (Signed)
[place] Access 360. Spoke with [employer]. She had no idea why we were calling and was less than helpful. I was placed on a hold and she came back to the line to let me know that the pt is enrolled in Access 360. Nothing further was needed.

## 2019-04-18 NOTE — Telephone Encounter (Signed)
Tamirah with [employer] at phone [phone] called regarding pt's [name] to see if she in enrolled in access 360. Routing message to injection pool for review and to advise. Thank you.

## 2019-04-19 NOTE — Telephone Encounter (Signed)
Spoke with pt. She is requesting samples for [facility] and patient assistance forms for Trelegy. At her last OV, she was told to try Trelelgy but it's going to cost her over $170 for a month's supply. Advised her that we could give her samples and these patient assistance forms when she comes in for her injection on 04/25/2019. Nothing further was needed at this time.

## 2019-04-20 NOTE — Telephone Encounter (Signed)
Fasenra Shipment Received:  30mg  #1 prefilled syringe Medication arrival Date:04/20/2019 Lot #: [id number] Medication exp date: 06/2020 Received by: Desmond Dike, CMA

## 2019-04-25 ENCOUNTER — Telehealth: Payer: Self-pay | Admitting: Internal Medicine

## 2019-04-25 ENCOUNTER — Ambulatory Visit: Payer: BLUE CROSS/BLUE SHIELD

## 2019-04-25 NOTE — Telephone Encounter (Signed)
Called pt to see if she forgot her appt was at 2:00. I was going to rsc her appt but her vm-box was full. I leaving for an appt.Meda Coffee asked Ria Comment if she would give pt her shot. "Yes." Nothing further needed.

## 2019-04-30 ENCOUNTER — Telehealth: Payer: Self-pay | Admitting: Internal Medicine

## 2019-04-30 ENCOUNTER — Ambulatory Visit (INDEPENDENT_AMBULATORY_CARE_PROVIDER_SITE_OTHER): Payer: BLUE CROSS/BLUE SHIELD

## 2019-04-30 ENCOUNTER — Other Ambulatory Visit: Payer: Self-pay

## 2019-04-30 DIAGNOSIS — J4551 Severe persistent asthma with (acute) exacerbation: Secondary | ICD-10-CM

## 2019-04-30 MED ORDER — FLUTICASONE FUROATE-VILANTEROL 200-25 MCG/INH IN AEPB: 1.0000 | INHALATION_SPRAY | Freq: Every day | RESPIRATORY_TRACT | 0 refills | Status: DC

## 2019-04-30 MED ORDER — BENRALIZUMAB 30 MG/ML ~~LOC~~ SOSY
30.0000 mg | PREFILLED_SYRINGE | Freq: Once | SUBCUTANEOUS | Status: AC
  Administered 2019-04-30: 17:00:00 30 mg via SUBCUTANEOUS

## 2019-04-30 NOTE — Telephone Encounter (Signed)
Pt requesting sample of Breo 200 1 Sample will be provided along with [place] pt assistance paperwork for trelegy.

## 2019-04-30 NOTE — Progress Notes (Signed)
Have you been hospitalized within the last 10 days?  No Do you have a fever?  No Do you have a cough?  No Do you have a headache or sore throat? No  

## 2019-04-30 NOTE — Telephone Encounter (Signed)
Returned call to patient She says [facility] says albuterol inhaler was not sent. Assured that it was and that I would call and that it would be ready for f/u this afternoon.  Called [facility] and verified that they did receive rx refill for Albuterol. It will only cost patient $4. They will have it ready for p/u this afternoon. Nothing further needed.

## 2019-04-30 NOTE — Telephone Encounter (Signed)
ATC mailbox full Albuterol was sent to [facility] 04/18/19

## 2019-05-01 MED ORDER — FLUTICASONE-UMECLIDIN-VILANT 100-62.5-25 MCG/INH IN AEPB: 1.0000 | INHALATION_SPRAY | Freq: Every day | RESPIRATORY_TRACT | 0 refills | Status: DC

## 2019-05-01 NOTE — Telephone Encounter (Signed)
Called patient as she should not be on Breo AND Trelegy. Patient states she likes Trelegy better. I told her her have coupons for her to use at the pharmacy for as little at 0$ and samples at the front for pick up. She understands she should not be using both inhalers and has not been using both. Patient also instructed if any further issues or problems she will need to come in for an office visit to go over medications.   Nothing further needed at this time.

## 2019-05-01 NOTE — Addendum Note (Signed)
Addended by: Amado Coe on: 05/01/2019 04:23 PM   Modules accepted: Orders

## 2019-05-01 NOTE — Telephone Encounter (Signed)
Samples and coupon placed up front for pick up. Patient will be in next week to pick up. She has been instructed just to use her breo inhaler until she can pick up new samples of Trelegy next week   Nothing further.

## 2019-05-10 ENCOUNTER — Telehealth: Payer: Self-pay | Admitting: Internal Medicine

## 2019-05-10 NOTE — Telephone Encounter (Signed)
Pt needs invoice so she can mail it her ins.co.. I wasn't here when her [name] came in. The invoice was thrown away. I called [facility]. They are going to mail the invoice to her home in 5 to 7 business days. Nothing further needed.

## 2019-05-11 ENCOUNTER — Telehealth: Payer: Self-pay | Admitting: Internal Medicine

## 2019-05-15 NOTE — Telephone Encounter (Signed)
Berna Bue Order: 30mg  #1 prefilled syringe Ordered date: 05/15/2019 Expected date of arrival: 05/22/2019 Ordered by: Desmond Dike, [place] Pharmacy: [facility]

## 2019-05-22 NOTE — Telephone Encounter (Signed)
Fasenra Shipment Received:  30mg  #1 prefilled syringe Medication arrival date: 05/22/2019 Lot #: [id number] Exp date: 06/2020 Received by: TBS

## 2019-06-08 ENCOUNTER — Telehealth: Payer: Self-pay

## 2019-06-08 MED ORDER — TRELEGY ELLIPTA 100-62.5-25 MCG/INH IN AEPB: 1.0000 | INHALATION_SPRAY | Freq: Every day | RESPIRATORY_TRACT | 0 refills | Status: DC

## 2019-06-08 NOTE — Telephone Encounter (Signed)
Pt walked in today to pick up sample of Trelegy from 05/01/2019 phone note.  This sample had been put back on the shelf as it has been over 30 days since pt was to be given sample.  Discussed proper technique and usage of inhaler, provided with coupon card as well.  Nothing further needed at this time- will close encounter.     479Y 08/2020 [phone]

## 2019-06-11 ENCOUNTER — Telehealth: Payer: Self-pay | Admitting: Internal Medicine

## 2019-06-11 ENCOUNTER — Ambulatory Visit: Payer: BLUE CROSS/BLUE SHIELD

## 2019-06-11 NOTE — Telephone Encounter (Signed)
Attempted to call pt but unable to reach. Left message for pt to return call.  Saw where pt has an injection appt scheduled today at 4pm and it looks like either Ria Comment or Alroy Bailiff have been trying to reach pt to let pt know this and that the Berna Bue was here but they have not been able to reach pt to tell her this.

## 2019-06-12 NOTE — Telephone Encounter (Signed)
Checked to see if pt showed up for her injection appt yesterday 6/22 and it seems like pt was a no show for this appt.  Attempted to call pt but unable to reach. Left message for pt to return call.

## 2019-06-13 ENCOUNTER — Ambulatory Visit: Payer: BC Managed Care – PPO

## 2019-06-13 ENCOUNTER — Ambulatory Visit (INDEPENDENT_AMBULATORY_CARE_PROVIDER_SITE_OTHER): Payer: BC Managed Care – PPO

## 2019-06-13 ENCOUNTER — Other Ambulatory Visit: Payer: Self-pay

## 2019-06-13 DIAGNOSIS — J4551 Severe persistent asthma with (acute) exacerbation: Secondary | ICD-10-CM | POA: Diagnosis not present

## 2019-06-13 MED ORDER — BENRALIZUMAB 30 MG/ML ~~LOC~~ SOSY
30.0000 mg | PREFILLED_SYRINGE | Freq: Once | SUBCUTANEOUS | Status: AC
  Administered 2019-06-13: 11:00:00 30 mg via SUBCUTANEOUS

## 2019-06-13 NOTE — Telephone Encounter (Signed)
Called and spoke with patient.  Patient has spoke with Tammy, S and is scheduled for injection.  Nothing further at this time.

## 2019-06-13 NOTE — Progress Notes (Signed)
Have you been hospitalized within the last 10 days?  No Do you have a fever?  No Do you have a cough?  No Do you have a headache or sore throat? No  

## 2019-07-04 ENCOUNTER — Telehealth: Payer: Self-pay | Admitting: Internal Medicine

## 2019-07-04 ENCOUNTER — Other Ambulatory Visit: Payer: Self-pay

## 2019-07-04 ENCOUNTER — Telehealth: Payer: Self-pay | Admitting: Family

## 2019-07-04 MED ORDER — POTASSIUM CHLORIDE ER 10 MEQ PO TBCR: 20.0000 meq | EXTENDED_RELEASE_TABLET | Freq: Every day | ORAL | 5 refills | Status: DC

## 2019-07-04 MED ORDER — ATORVASTATIN CALCIUM 10 MG PO TABS: 10.0000 mg | ORAL_TABLET | Freq: Every day | ORAL | 0 refills | Status: DC

## 2019-07-04 MED ORDER — FUROSEMIDE 20 MG PO TABS: 20.0000 mg | ORAL_TABLET | Freq: Every day | ORAL | 5 refills | Status: DC

## 2019-07-04 NOTE — Telephone Encounter (Signed)
Called & spoke w/ pt. Pt states she would like to schedule a regular in-office visit with Dr. Annamaria Boots sooner than November. I let her know Dr. Annamaria Boots does not have any openings until November, but that we would be happy to schedule her with an NP. Pt denied this and insisted on being seen by CY only. Pt states she's having a runny nose and itchy eyes that is affecting her asthma. Denies fever/chills/muscle aches. Reports taking her breathing medications and prednisone 10 mg as directed. I let her know I would get a message routed to CY to see if he has any recommendations and/or he would be okay with fitting her in somewhere.   CY, please advise. Thank you.

## 2019-07-04 NOTE — Telephone Encounter (Signed)
Medication refills sent to her pharmacy

## 2019-07-04 NOTE — Telephone Encounter (Signed)
Pt is scheduled for appt 7/31 which was the first that fit her schedule. Pt states she is out of atorvastatin, furosemide, and potassium chloride. She is requesting call to notify her of RX to be sent in for her.    [place], [place].

## 2019-07-05 NOTE — Telephone Encounter (Signed)
Call made to patient, offered an appt tomorrow she declined stating she has to work. Appointment made for 07/16/2019 per CY. Voiced understanding. Nothing further is needed at this time.

## 2019-07-05 NOTE — Telephone Encounter (Signed)
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

## 2019-07-16 ENCOUNTER — Other Ambulatory Visit: Payer: Self-pay

## 2019-07-16 ENCOUNTER — Encounter: Payer: Self-pay | Admitting: Internal Medicine

## 2019-07-16 ENCOUNTER — Ambulatory Visit (INDEPENDENT_AMBULATORY_CARE_PROVIDER_SITE_OTHER): Payer: BC Managed Care – PPO | Admitting: Internal Medicine

## 2019-07-16 VITALS — BP 134/86 | HR 88 | Temp 97.4°F | Ht 67.0 in | Wt 216.4 lb

## 2019-07-16 DIAGNOSIS — J3089 Other allergic rhinitis: Secondary | ICD-10-CM | POA: Diagnosis not present

## 2019-07-16 DIAGNOSIS — J4550 Severe persistent asthma, uncomplicated: Secondary | ICD-10-CM

## 2019-07-16 DIAGNOSIS — J302 Other seasonal allergic rhinitis: Secondary | ICD-10-CM

## 2019-07-16 DIAGNOSIS — J449 Chronic obstructive pulmonary disease, unspecified: Secondary | ICD-10-CM

## 2019-07-16 MED ORDER — PREDNISONE 10 MG PO TABS: ORAL_TABLET | ORAL | 0 refills | Status: DC

## 2019-07-16 MED ORDER — METHYLPREDNISOLONE ACETATE 80 MG/ML IJ SUSP
80.0000 mg | Freq: Once | INTRAMUSCULAR | Status: AC
  Administered 2019-07-16: 80 mg via INTRAMUSCULAR

## 2019-07-16 MED ORDER — TRELEGY ELLIPTA 100-62.5-25 MCG/INH IN AEPB: 1.0000 | INHALATION_SPRAY | Freq: Every day | RESPIRATORY_TRACT | 5 refills | Status: DC

## 2019-07-16 MED ORDER — TRELEGY ELLIPTA 100-62.5-25 MCG/INH IN AEPB: 1.0000 | INHALATION_SPRAY | Freq: Every day | RESPIRATORY_TRACT | 0 refills | Status: DC

## 2019-07-16 NOTE — Assessment & Plan Note (Signed)
I discussed use of Afrin, saline and nasal strips. Need to look again for polyps at future exams.

## 2019-07-16 NOTE — Assessment & Plan Note (Signed)
I am concerned that she is leaning too much on steroids. Discussed. Plan- try to reduce maintenance prednisone. We may need to get Endocrine help to assess adrenal function. Plan refill prednisone and Trelegy, with discussion

## 2019-07-16 NOTE — Progress Notes (Signed)
Patient ID: [name], female    DOB: November 21, 1966, 53 y.o.   MRN: [id number] F followed for moderate persistent asthma with COPD (requiring frequent steroids , Failed Xolair, , hx nasal polyps, allergic rhinitis, hx DVT PFT- 09/09/11- FEV1 1.64/ 54%, FEV1/FVC 0.54, FEF25-75% 0.74/ 22%, insignif response to bronchodilator TLC 0.74, DLCO 72%. Moderate obstructive disease, mild restriction, mild reduction of DLCO. Office spirometry- mild obstructive airways disease. FEV1 1.95/73%, FVC 3.01/93%, FEV1/FVC 0.65, FEF 25-75% 1.26/38%.   -----------------------------------------------------------------  12/28/2018- 53 year old female never smoker followed for moderate persistent asthma with COPD (frequent steroids, failed Xolair), history nasal polyps, allergic rhinitis, history DVT -----Asthma flare up since last friday.  Albuterol HFA, neb albuterol, EpiPen, Breo 200, neb Atrovent, prednisone 5 mg, Fasenra Now on Z-Pak and prednisone taper again for current flare. Feels tight. Says when she started [place] monthly she didn't need steroids for a long time, but since going to every 2 months she has been less well controlled. Not clear how much of this is a seasonal/ weather issue as well. Steroid side effects including impact on blood sugar discussed.   07/16/2019- 53 year old female never smoker followed for moderate persistent asthma with COPD (frequent steroids, failed Xolair), history nasal polyps, allergic rhinitis, history DVT Albuterol HFA, neb albuterol/ atrovent, EpiPen, Trelegy,  prednisone 5 mg, Fasenra -----pt states breathing varies; pt reports stuffiness at night, seasonal allergy symptoms On Fasenra x 6 months. Fewer real attacks. Hasn't tried to push down prednisone, and may be not recognizing how much Berna Bue is doing. Has been taking pred 20 mg most days- discussed steroid long term effects and adrenal impact again. Little phlegm. Occ wakes from wheeze. Using rescue most  days. Bothered by nasal conge as she lies down.  Review of Systems-See HPI     + = positive Constitutional:   No weight loss, night sweats,  Fevers, chills, fatigue, lassitude. HEENT:   No headaches,  Difficulty swallowing,  Tooth/dental problems,  Sore throat,                No sneezing, itching, ear ache,, + post nasal drip. + nasal congestion. CV:  No chest pain,  Orthopnea, PND, swelling in lower extremities, anasarca, dizziness, palpitations GI  No heartburn, indigestion, abdominal pain, nausea, vomiting, Resp:Usually some shortness of breath with exertion, not at rest.  No excess mucus, + productive cough,          + non-productive cough,  No coughing up of blood.   change in color of mucus.  + wheezing.   Skin: no rash or lesions. GU: . MS:  No joint pain, + swelling.   Neuro- nothing unusual Psych:  No change in mood or affect. No depression or anxiety.  No memory loss.  Objective:   Physical Exam General- Alert, Oriented, Affect-appropriate, Distress- none acute  Relaxed and conversational   + overweight, + pink cheeks/ steroid facies Skin- rash-none, lesions- none, excoriation- none Lymphadenopathy- none Head- atraumatic            Eyes- Gross vision intact, PERRLA, conjunctivae clear secretions            Ears- Hearing, canals normal            Nose- +Turbinate edema, No-Septal dev, mucus, , erosion, perforation. No definite polyps             Throat- Mallampati III-IV, mucosa -not red , drainage- none, tonsils- atrophic Neck- flexible , trachea midline, no stridor , thyroid nl, carotid no bruit Chest -  symmetrical excursion , unlabored           Heart/CV- RRR , no murmur , no gallop  , no rub, nl s1 s2                           - JVD- none , edema 1-2+ , stasis changes- none, varices- none           Lung-  wheeze+ mild I&E,  unlabored, cough- none , dullness-none, rub- none           Chest wall-  Abd-  Br/ Gen/ Rectal- Not done, not indicated Extrem- cyanosis- none,  clubbing, none, atrophy- none, strength- nl.  Neuro- grossly intact to observation

## 2019-07-16 NOTE — Patient Instructions (Addendum)
Order- depo 80   Dx Asthma severe persistent   Order- PFT   Ok to try using otc Breathe Right nasal strips at bedtime for stuffy nose  Ok to try nasal saline spray as often as needed  If you need to, you can use 1 spray of Afrin in each nostril at bed time. Try not to use it more than once daily to avoid dependence and rebound  Please call if we can help

## 2019-07-17 ENCOUNTER — Telehealth: Payer: Self-pay | Admitting: Internal Medicine

## 2019-07-17 NOTE — Telephone Encounter (Signed)
Called [facility] to set up shipment for pt's Fasenra. Was advised that the pt needs a PA. Will need to call pt's insurance company at [phone] to start this.

## 2019-07-18 NOTE — Telephone Encounter (Signed)
Contacted pt's insurance to start PA renewal. PA has been approved to 01/18/2020.

## 2019-07-18 NOTE — Telephone Encounter (Signed)
Berna Bue Order: 30mg  #1 prefilled syringe Ordered date: 07/18/2019  Expected date of arrival: 07/20/2019 Ordered by: Parke Poisson, [place]: [name]

## 2019-07-20 ENCOUNTER — Ambulatory Visit: Payer: BC Managed Care – PPO | Admitting: Family

## 2019-07-20 NOTE — Telephone Encounter (Signed)
Fasenra Shipment Received:  30mg  #1 prefilled syringe Medication arrival date: 07/20/19 Lot #: [id number] Exp date: 09/2020 Received by: Suzi Roots

## 2019-07-25 ENCOUNTER — Ambulatory Visit: Payer: BC Managed Care – PPO

## 2019-07-25 ENCOUNTER — Ambulatory Visit (INDEPENDENT_AMBULATORY_CARE_PROVIDER_SITE_OTHER): Payer: BC Managed Care – PPO

## 2019-07-25 ENCOUNTER — Other Ambulatory Visit: Payer: Self-pay

## 2019-07-25 DIAGNOSIS — J4550 Severe persistent asthma, uncomplicated: Secondary | ICD-10-CM

## 2019-07-25 MED ORDER — BENRALIZUMAB 30 MG/ML ~~LOC~~ SOSY
30.0000 mg | PREFILLED_SYRINGE | Freq: Once | SUBCUTANEOUS | Status: AC
  Administered 2019-07-25: 30 mg via SUBCUTANEOUS

## 2019-07-25 NOTE — Progress Notes (Signed)
Have you been hospitalized within the last 10 days?  No Do you have a fever?  No Do you have a cough?  No Do you have a headache or sore throat? No  

## 2019-08-17 ENCOUNTER — Encounter: Payer: Self-pay | Admitting: Family

## 2019-08-17 ENCOUNTER — Other Ambulatory Visit: Payer: Self-pay

## 2019-08-17 ENCOUNTER — Telehealth: Payer: Self-pay | Admitting: Family

## 2019-08-17 ENCOUNTER — Ambulatory Visit (INDEPENDENT_AMBULATORY_CARE_PROVIDER_SITE_OTHER): Payer: BC Managed Care – PPO | Admitting: Family

## 2019-08-17 VITALS — BP 125/88 | HR 96 | Temp 97.8°F | Resp 16 | Ht 66.5 in | Wt 211.0 lb

## 2019-08-17 DIAGNOSIS — J449 Chronic obstructive pulmonary disease, unspecified: Secondary | ICD-10-CM | POA: Diagnosis not present

## 2019-08-17 DIAGNOSIS — E119 Type 2 diabetes mellitus without complications: Secondary | ICD-10-CM

## 2019-08-17 DIAGNOSIS — I10 Essential (primary) hypertension: Secondary | ICD-10-CM

## 2019-08-17 DIAGNOSIS — E785 Hyperlipidemia, unspecified: Secondary | ICD-10-CM

## 2019-08-17 MED ORDER — ATORVASTATIN CALCIUM 10 MG PO TABS: 10.0000 mg | ORAL_TABLET | Freq: Every day | ORAL | 5 refills | Status: DC

## 2019-08-17 NOTE — Telephone Encounter (Signed)
Also, need mammogram report from Dr. Malachi Carl office please.

## 2019-08-17 NOTE — Telephone Encounter (Signed)
Medical records release form faxed to [facility] obgyn

## 2019-08-17 NOTE — Progress Notes (Signed)
Subjective:    Patient ID: [name], female    DOB: 1966-05-04, 53 y.o.   MRN: [id number]  HPI   Patient is a 53 yr old female who presents today for follow up.  DM2- she is maintained on DM diet alone. Has not been needing prednisone as often for her asthma since she was placed on fasenra.  Lab Results  Component Value Date   HGBA1C 6.7 (H) 10/23/2018   HGBA1C 6.9 (H) 04/17/2018   HGBA1C 6.7 (H) 07/13/2017   Lab Results  Component Value Date   MICROALBUR 1.5 10/23/2018   LDLCALC 103 (H) 10/23/2018   CREATININE 0.82 10/23/2018   Asthma- stable/improved on fasenra. She is also maintained on Telegy Ellipta.    HTN-  Maintained on lasix once daily.  Notes some improvement in her LE edema since her prednisone frequency has been less.  BP Readings from Last 3 Encounters:  08/17/19 125/88  07/16/19 134/86  12/28/18 126/84   Hyperlipidemia- maintained on atorvastatin 10mg  once daily.  Lab Results  Component Value Date   CHOL 179 10/23/2018   HDL 57.80 10/23/2018   LDLCALC 103 (H) 10/23/2018   TRIG 90.0 10/23/2018   CHOLHDL 3 10/23/2018     Review of Systems See HPI  Past Medical History:  Diagnosis Date  . Allergic rhinitis, cause unspecified   . Asthma   . Diabetes type 2, controlled ([place])   . History of DVT (deep vein thrombosis) 2005   Pt reports blood clot below knee ?unsure which leg.  . Nasal polyps   . Unspecified essential hypertension      Social History   Socioeconomic History  . Marital status: Married    Spouse name: Not on file  . Number of children: Not on file  . Years of education: Not on file  . Highest education level: Not on file  Occupational History  . Occupation: Company secretary  Social Needs  . Financial resource strain: Not on file  . Food insecurity    Worry: Not on file    Inability: Not on file  . Transportation needs    Medical: Not on file    Non-medical: Not on file  Tobacco Use  . Smoking status: Never  Smoker  . Smokeless tobacco: Never Used  . Tobacco comment: Never Used Tobacco  Substance and Sexual Activity  . Alcohol use: Yes    Alcohol/week: 0.0 standard drinks    Comment: 1 glass of wine per year  . Drug use: No  . Sexual activity: Yes    Birth control/protection: None  Lifestyle  . Physical activity    Days per week: Not on file    Minutes per session: Not on file  . Stress: Not on file  Relationships  . Social Herbalist on phone: Not on file    Gets together: Not on file    Attends religious service: Not on file    Active member of club or organization: Not on file    Attends meetings of clubs or organizations: Not on file    Relationship status: Not on file  . Intimate partner violence    Fear of current or ex partner: Not on file    Emotionally abused: Not on file    Physically abused: Not on file    Forced sexual activity: Not on file  Other Topics Concern  . 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      Past Surgical History:  Procedure Laterality Date  . LASIK Bilateral 2008  . NASAL SINUS SURGERY  [id number]   Dr Erik Obey  . OVARIAN CYST REMOVAL  02/2016   pt reported-- Dr Philis Pique  . UTERINE FIBROID SURGERY  2005    Family History  Problem Relation Age of Onset  . Cancer Mother 47       ?carcinoid tumor (had surgical removal)  . Diabetes Mother        borderline  . Stomach cancer Mother   . Heart disease Father        died 55, from MI, had hx of ETOH abuse and liver disease  . Thyroid cancer Sister        (diagnosed at 17) half sister (same dad)   . Diabetes type II Brother   . Kidney disease Neg Hx   . Hypertension Neg Hx   . Hyperlipidemia Neg Hx   . Colon cancer Neg Hx   . Esophageal cancer Neg Hx   . Rectal cancer Neg Hx     Allergies  Allergen Reactions  . Aspirin      wheezing  . Latex   . Penicillins Hives  . Shellfish Allergy     Current Outpatient Medications on File Prior to Visit  Medication Sig Dispense Refill  . albuterol (PROVENTIL) (2.5 MG/3ML) 0.083% nebulizer solution Use 1 vial in nebulizer as needed dx:496 150 mL PRN  . albuterol (VENTOLIN HFA) 108 (90 Base) MCG/ACT inhaler Inhale 2 puffs into the lungs every 6 (six) hours as needed for wheezing or shortness of breath. 1 Inhaler 6  . Benralizumab (FASENRA) 30 MG/ML SOSY Inject 30 mg as directed See admin instructions for 20 doses. Inject 30 mg under the skin every 6 weeks 1 mL 20  . cetirizine (ZYRTEC) 10 MG tablet Take 10 mg by mouth daily.    . Fluticasone-Umeclidin-Vilant (TRELEGY ELLIPTA) 100-62.5-25 MCG/INH AEPB Inhale 1 puff into the lungs daily. 1 each 0  . furosemide (LASIX) 20 MG tablet Take 1 tablet (20 mg total) by mouth daily. 30 tablet 5  . ipratropium (ATROVENT) 0.02 % nebulizer solution Use 1 vial in nebulizer as needed dx. 496 150 mL PRN  . potassium chloride (K-DUR) 10 MEQ tablet Take 2 tablets (20 mEq total) by mouth daily. 60 tablet 5  . predniSONE (DELTASONE) 10 MG tablet 1.5 tabs daily or lowest dose needed 200 tablet 0  . Prenatal Vit-Fe Sulfate-FA (PRENATAL VITAMIN PO) Take 1 capsule by mouth daily.     No current facility-administered medications on file prior to visit.     BP 125/88 (BP Location: Right Arm, Patient Position: Sitting, Cuff Size: Small)   Pulse 96   Temp 97.8 F (36.6 C) (Temporal)   Resp 16   Ht 5' 6.5" (1.689 m)   Wt 211 lb (95.7 kg)   SpO2 100%   BMI 33.55 kg/m       Objective:   Physical Exam Constitutional:      Appearance: She is well-developed.  Cardiovascular:     Rate and Rhythm: Normal rate and regular rhythm.     Heart sounds: Normal heart sounds. No murmur.  Pulmonary:     Effort: Pulmonary effort is normal. No respiratory distress.     Breath sounds: Normal breath sounds. No wheezing.  Musculoskeletal:     Right  lower leg: 2+ Edema present.     Left lower leg: 2+ Edema present.  Psychiatric:        Behavior: Behavior normal.        Thought Content: Thought content normal.        Judgment: Judgment normal.           Assessment & Plan:  HTN- bp stable, continue current dose of lasix.  Asthma- stable, management per pulmonology.  Hyperlipidemia- tolerating statin.  Obtain follow up lipid panel.  DM2- clinically stable. Obtain follow up A1C.

## 2019-08-17 NOTE — Telephone Encounter (Signed)
Can you please call the optometry office at [facility] on [address] and request a copy of her last DM eye exam? tks

## 2019-08-18 LAB — COMPREHENSIVE METABOLIC PANEL
AG Ratio: 1.6 (calc) (ref 1.0–2.5)
ALT: 18 U/L (ref 6–29)
AST: 18 U/L (ref 10–35)
Albumin: 3.9 g/dL (ref 3.6–5.1)
Alkaline phosphatase (APISO): 110 U/L (ref 37–153)
BUN: 12 mg/dL (ref 7–25)
CO2: 25 mmol/L (ref 20–32)
Calcium: 9.1 mg/dL (ref 8.6–10.4)
Chloride: 106 mmol/L (ref 98–110)
Creat: 0.79 mg/dL (ref 0.50–1.05)
Globulin: 2.5 g/dL (calc) (ref 1.9–3.7)
Glucose, Bld: 70 mg/dL (ref 65–99)
Potassium: 3.5 mmol/L (ref 3.5–5.3)
Sodium: 142 mmol/L (ref 135–146)
Total Bilirubin: 0.4 mg/dL (ref 0.2–1.2)
Total Protein: 6.4 g/dL (ref 6.1–8.1)

## 2019-08-18 LAB — LIPID PANEL
Cholesterol: 169 mg/dL (ref <200)
HDL: 60 mg/dL (ref >=50)
LDL Cholesterol (Calc): 90 mg/dL (calc)
Non-HDL Cholesterol (Calc): 109 mg/dL (calc) (ref <130)
Total CHOL/HDL Ratio: 2.8 (calc) (ref <5.0)
Triglycerides: 94 mg/dL (ref <150)

## 2019-08-18 LAB — HEMOGLOBIN A1C
Hgb A1c MFr Bld: 6.5 % of total Hgb — ABNORMAL HIGH (ref <5.7)
Mean Plasma Glucose: 140 (calc)
eAG (mmol/L): 7.7 (calc)

## 2019-08-20 ENCOUNTER — Encounter: Payer: Self-pay | Admitting: Family

## 2019-09-05 ENCOUNTER — Telehealth: Payer: Self-pay | Admitting: Internal Medicine

## 2019-09-05 NOTE — Telephone Encounter (Signed)
Called and spoke with pt. Pt states she's had a "stuffy nose" only when she lays down to rest at night for 2 days. She states her breathing is fine during the day when she is upright. She denies fever/chills/body aches. Denies chest pain/tightness. States she has mild shortness of breath/wheeze. She reports taking her breathing medications and Fasenra injections as directed and denies taking any OTC remedies.   Since CY is not in the office currently, I am routing this to the provider of the day, Beth. Beth, please advise with you recommendations.

## 2019-09-05 NOTE — Telephone Encounter (Signed)
Called and spoke with pt regarding Beth's recommendations. Pt verbalized understanding with an additional question regarding her prednisone. Pt states CY prescribed her 200 tablets; however, the pharmacy only gave her 130 tablets. She inquired if she was charged for the price of 200 tablets. I suggested she give her pharmacy a call about this, as [facility] does not dispense the medication, only places the order. Pt expressed understanding. Nothing further needed at this time.

## 2019-09-05 NOTE — Telephone Encounter (Signed)
Recommend Flonase or nasacort daily. She can also try sudafed over the counter for 3-5 days. If she has uncontrolled hypertension do not recommend decongestant

## 2019-09-12 ENCOUNTER — Telehealth: Payer: Self-pay | Admitting: Internal Medicine

## 2019-09-14 NOTE — Telephone Encounter (Signed)
Patient gets medication through specialty pharmacy, should not have been buy and bill, sent charge correction asking them to delete the medication charge for 8.10.20.  Called patient, she is not happy.

## 2019-09-17 NOTE — Telephone Encounter (Signed)
NotedKathlee Nations aware.

## 2019-11-19 ENCOUNTER — Telehealth: Payer: Self-pay | Admitting: Adult Health

## 2019-11-19 NOTE — Telephone Encounter (Signed)
See calls about [facility] billing. This needs to be addressed by Lindsay's team

## 2019-11-19 NOTE — Telephone Encounter (Signed)
Called by patient , she is very upset because she has not heard from anyone for her Fasenra injection . She is overdue for this.  Would like a call back tomorrow to discuss when she can come in for this .  Also received another bill for this and is very angry as she is upset this will affect her credit.

## 2019-11-20 ENCOUNTER — Telehealth: Payer: Self-pay

## 2019-11-20 NOTE — Telephone Encounter (Signed)
Spoke with Kathlee Nations who worked on this for the patient in September 2020 and the charge has been voided.  Routing to Kathlee Nations to see if we can include the billing documentation.

## 2019-11-20 NOTE — Telephone Encounter (Signed)
°  Please see above screen shot of her account, the charge for the [name] has been voided, she is only receiving a bill for a copay associated with the injection fee.

## 2019-11-20 NOTE — Telephone Encounter (Signed)
Received message from after hours team, she is wanting to know if her medication is here and when she can come in and get her injection. She reports it is so frustrating that it has taken this long to get her financials correct. I made her aware per the documentation the charge has been voided. Voiced understanding. She states she calls after 5 because she can never get through during the day time. I expressed our apologies that she is not happy with our services right now. I made her aware I would get our message to our injection team and we would give her a call.   Please avvise. Thanks.

## 2019-11-20 NOTE — Telephone Encounter (Signed)
Please advise 

## 2019-11-21 NOTE — Telephone Encounter (Signed)
Patient is aware per 12.1.2020 phone note Will sign off

## 2019-11-22 ENCOUNTER — Telehealth: Payer: Self-pay | Admitting: Internal Medicine

## 2019-11-22 NOTE — Telephone Encounter (Signed)
[name] Order: 30mg  #1 prefilled syringe Ordered date: 11/22/19  Expected date of arrival: 11/27/19 Ordered by: Parke Poisson, CMA  Specialty Pharmacy: [facility]

## 2019-11-26 ENCOUNTER — Telehealth: Payer: Self-pay | Admitting: Internal Medicine

## 2019-11-26 NOTE — Telephone Encounter (Signed)
Ok to refill her prednisone as before.  Please ask the injection team to contact her directly about the missed injections. She has called several times about this but I don't understand why they are having a problem.

## 2019-11-26 NOTE — Telephone Encounter (Signed)
Patient did not schedule next injection at her last injection. Medications have to be ordered prior to patient appointment to make sure medication is available at appointment.  Patient called 11/20/19 and message was routed to injection pool.  Berna Bue was ordered 11/22/19 by Janett Billow for 11/27/19 delivery.  Patient has injection appointment 11/28/19. Called and spoke with Patient.  Explained she is to call to schedule injection appointments.  Patient was told she needs to schedule next injection appointment before leaving office.  Patient stated no one has called her to schedule injection.  Explained again she needs to schedule injection appointments.  Medications are ordered by injection staff one week prior to appointment to ensure medication is here when Patient is scheduled for injections. Patient was made aware her Berna Bue was ordered 12/3 for 12/9 injection appointment.  Patient kept  saying no one called her to schedule her injection appointments.   Patient requested to speak with someone in charge of injections. Patient requested manager to call her to discuss her appointments.  Message routed to Rica Mote, Surveyor, quantity

## 2019-11-26 NOTE — Telephone Encounter (Signed)
Spoke with patient. She wants to know why has she missed the injections for Sept, Oct, and Nov.  I will route to injection pool to address this issue. Patient does have appt on Wednesday 11/28/19.  Dr. Annamaria Boots patient would also like a refill on her prednisone and wanted you to know she hasn't had her injection in a few months. Patient is scheduled for Wednesday   Dr. Annamaria Boots please advise if patient's prednisone can be refilled  Allergies  Allergen Reactions  . Aspirin     wheezing  . Latex   . Penicillins Hives  . Shellfish Allergy    Current Outpatient Medications on File Prior to Visit  Medication Sig Dispense Refill  . albuterol (PROVENTIL) (2.5 MG/3ML) 0.083% nebulizer solution Use 1 vial in nebulizer as needed dx:496 150 mL PRN  . albuterol (VENTOLIN HFA) 108 (90 Base) MCG/ACT inhaler Inhale 2 puffs into the lungs every 6 (six) hours as needed for wheezing or shortness of breath. 1 Inhaler 6  . atorvastatin (LIPITOR) 10 MG tablet Take 1 tablet (10 mg total) by mouth daily. 30 tablet 5  . Benralizumab (FASENRA) 30 MG/ML SOSY Inject 30 mg as directed See admin instructions for 20 doses. Inject 30 mg under the skin every 6 weeks 1 mL 20  . cetirizine (ZYRTEC) 10 MG tablet Take 10 mg by mouth daily.    . Fluticasone-Umeclidin-Vilant (TRELEGY ELLIPTA) 100-62.5-25 MCG/INH AEPB Inhale 1 puff into the lungs daily. 1 each 0  . furosemide (LASIX) 20 MG tablet Take 1 tablet (20 mg total) by mouth daily. 30 tablet 5  . ipratropium (ATROVENT) 0.02 % nebulizer solution Use 1 vial in nebulizer as needed dx. 496 150 mL PRN  . potassium chloride (K-DUR) 10 MEQ tablet Take 2 tablets (20 mEq total) by mouth daily. 60 tablet 5  . predniSONE (DELTASONE) 10 MG tablet 1.5 tabs daily or lowest dose needed 200 tablet 0  . Prenatal Vit-Fe Sulfate-FA (PRENATAL VITAMIN PO) Take 1 capsule by mouth daily.     No current facility-administered medications on file prior to visit.

## 2019-11-27 NOTE — Telephone Encounter (Deleted)
Message routed to [employer]. TD aware.

## 2019-11-27 NOTE — Telephone Encounter (Signed)
Fasenra Shipment Received:  30mg  #1 prefilled syringe Medication arrival date: 11/27/19 Lot #: [id number] Exp date: 12/20/2020 Received by: Elliot Dally

## 2019-11-27 NOTE — Telephone Encounter (Signed)
Message routed to [employer]. TD aware.

## 2019-11-28 ENCOUNTER — Other Ambulatory Visit: Payer: Self-pay

## 2019-11-28 ENCOUNTER — Ambulatory Visit (INDEPENDENT_AMBULATORY_CARE_PROVIDER_SITE_OTHER): Payer: BC Managed Care – PPO

## 2019-11-28 DIAGNOSIS — J4550 Severe persistent asthma, uncomplicated: Secondary | ICD-10-CM

## 2019-11-28 MED ORDER — PREDNISONE 10 MG PO TABS: ORAL_TABLET | ORAL | 0 refills | Status: DC

## 2019-11-28 MED ORDER — BENRALIZUMAB 30 MG/ML ~~LOC~~ SOSY
30.0000 mg | PREFILLED_SYRINGE | Freq: Once | SUBCUTANEOUS | Status: AC
  Administered 2019-11-28: 17:00:00 30 mg via SUBCUTANEOUS

## 2019-11-28 NOTE — Telephone Encounter (Signed)
Called and spoke to patient. Patient stated she has not gotten Rx for prednisone.  Verified pharmacy of choice and Rx sent. Advised patient of the process moving forward for scheduling injections and that changes may still occur in the future.  Patient verbalized understanding. Nothing further needed at this time.

## 2019-11-28 NOTE — Progress Notes (Signed)
Have you been hospitalized within the last 10 days?  No Do you have a fever?  No Do you have a cough?  No Do you have a headache or sore throat? No  

## 2019-12-18 ENCOUNTER — Ambulatory Visit: Payer: BC Managed Care – PPO | Admitting: Family

## 2019-12-26 ENCOUNTER — Ambulatory Visit: Payer: BC Managed Care – PPO | Admitting: Family

## 2020-01-02 ENCOUNTER — Other Ambulatory Visit: Payer: Self-pay

## 2020-01-02 ENCOUNTER — Ambulatory Visit (INDEPENDENT_AMBULATORY_CARE_PROVIDER_SITE_OTHER): Payer: BC Managed Care – PPO | Admitting: Family

## 2020-01-02 ENCOUNTER — Encounter: Payer: Self-pay | Admitting: Family

## 2020-01-02 VITALS — BP 128/86 | HR 88 | Temp 96.7°F | Resp 16 | Ht 67.0 in | Wt 195.0 lb

## 2020-01-02 DIAGNOSIS — E119 Type 2 diabetes mellitus without complications: Secondary | ICD-10-CM

## 2020-01-02 DIAGNOSIS — I10 Essential (primary) hypertension: Secondary | ICD-10-CM | POA: Diagnosis not present

## 2020-01-02 DIAGNOSIS — J4489 Other specified chronic obstructive pulmonary disease: Secondary | ICD-10-CM

## 2020-01-02 DIAGNOSIS — E785 Hyperlipidemia, unspecified: Secondary | ICD-10-CM

## 2020-01-02 DIAGNOSIS — J449 Chronic obstructive pulmonary disease, unspecified: Secondary | ICD-10-CM | POA: Diagnosis not present

## 2020-01-02 LAB — BASIC METABOLIC PANEL
BUN: 11 mg/dL (ref 6–23)
CO2: 29 mEq/L (ref 19–32)
Calcium: 9.1 mg/dL (ref 8.4–10.5)
Chloride: 104 mEq/L (ref 96–112)
Creatinine, Ser: 0.78 mg/dL (ref 0.40–1.20)
GFR: 93.79 mL/min (ref >60.00)
Glucose, Bld: 82 mg/dL (ref 70–99)
Potassium: 3.2 mEq/L — ABNORMAL LOW (ref 3.5–5.1)
Sodium: 140 mEq/L (ref 135–145)

## 2020-01-02 LAB — MICROALBUMIN / CREATININE URINE RATIO
Creatinine,U: 374.3 mg/dL
Microalb Creat Ratio: 0.8 mg/g (ref 0.0–30.0)
Microalb, Ur: 3.1 mg/dL — ABNORMAL HIGH (ref 0.0–1.9)

## 2020-01-02 NOTE — Progress Notes (Signed)
Subjective:    Patient ID: [name], female    DOB: Apr 16, 1966, 54 y.o.   MRN: [id number]  HPI   Patient presents today for follow up.  Asthma-  Reports asthma has been well controlled. Reports that she has been on Fasenra which has been helping to keep her off of prednisone.   Hyperlipidemia- continues lipitor.  Lab Results  Component Value Date   CHOL 169 08/17/2019   HDL 60 08/17/2019   LDLCALC 90 08/17/2019   TRIG 94 08/17/2019   CHOLHDL 2.8 08/17/2019   She continues lasix daily.  She reports good compliance with potassium supplement.  HTN- on furosemide. BP Readings from Last 3 Encounters:  01/02/20 128/86  08/17/19 125/88  07/16/19 134/86   DM2- diet controlled.  Lab Results  Component Value Date   HGBA1C 6.5 (H) 08/17/2019   HGBA1C 6.7 (H) 10/23/2018   HGBA1C 6.9 (H) 04/17/2018   Lab Results  Component Value Date   MICROALBUR 1.5 10/23/2018   [place] 90 08/17/2019   CREATININE 0.79 08/17/2019      Review of Systems See HPI  Past Medical History:  Diagnosis Date  . Allergic rhinitis, cause unspecified   . Asthma   . Diabetes type 2, controlled ([facility])   . History of DVT (deep vein thrombosis) 2005   Pt reports blood clot below knee ?unsure which leg.  . Nasal polyps   . Unspecified essential hypertension      Social History   Socioeconomic History  . Marital status: Married    Spouse name: Not on file  . Number of children: Not on file  . Years of education: Not on file  . Highest education level: Not on file  Occupational History  . Occupation: Therapist, art club  Tobacco Use  . Smoking status: Never Smoker  . Smokeless tobacco: Never Used  . Tobacco comment: Never Used Tobacco  Substance and Sexual Activity  . Alcohol use: Yes    Alcohol/week: 0.0 standard drinks    Comment: 1 glass of wine per year  . Drug use: No  . Sexual activity: Yes    Birth control/protection: None  Other Topics Concern  . 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     Social Determinants of Health   Financial Resource Strain:   . Difficulty of Paying Living Expenses: Not on file  Food Insecurity:   . Worried About Charity fundraiser in the Last Year: Not on file  . Ran Out of Food in the Last Year: Not on file  Transportation Needs:   . Lack of Transportation (Medical): Not on file  . Lack of Transportation (Non-Medical): Not on file  Physical Activity:   . Days of Exercise per Week: Not on file  . Minutes of Exercise per Session: Not on file  Stress:   . Feeling of Stress : Not on file  Social Connections:   . Frequency of Communication with Friends and Family: Not on file  . Frequency of Social Gatherings with Friends and Family: Not on file  . Attends Religious Services: Not on file  . Active Member of Clubs or Organizations: Not on file  . Attends Archivist Meetings: Not on file  . Marital Status: Not on file  Intimate Partner Violence:   . Fear of Current or Ex-Partner: Not on file  . Emotionally Abused: Not on file  . Physically Abused: Not on file  . Sexually Abused: Not on file    Past Surgical History:  Procedure Laterality Date  . LASIK Bilateral 2008  . NASAL SINUS SURGERY  [id number]   Dr Erik Obey  . OVARIAN CYST REMOVAL  02/2016   pt reported-- Dr Philis Pique  . UTERINE FIBROID SURGERY  2005    Family History  Problem Relation Age of Onset  . Cancer Mother 102       ?carcinoid tumor (had surgical removal)  . Diabetes Mother        borderline  . Stomach cancer Mother   . Heart disease Father        died 20, from MI, had hx of ETOH abuse and liver disease  . Thyroid cancer Sister        (diagnosed at 44) half sister (same dad)   . Diabetes type II Brother   . Kidney disease Neg Hx   . Hypertension Neg Hx   . Hyperlipidemia Neg  Hx   . Colon cancer Neg Hx   . Esophageal cancer Neg Hx   . Rectal cancer Neg Hx     Allergies  Allergen Reactions  . Aspirin     wheezing  . Latex   . Penicillins Hives  . Shellfish Allergy     Current Outpatient Medications on File Prior to Visit  Medication Sig Dispense Refill  . albuterol (PROVENTIL) (2.5 MG/3ML) 0.083% nebulizer solution Use 1 vial in nebulizer as needed dx:496 150 mL PRN  . albuterol (VENTOLIN HFA) 108 (90 Base) MCG/ACT inhaler Inhale 2 puffs into the lungs every 6 (six) hours as needed for wheezing or shortness of breath. 1 Inhaler 6  . atorvastatin (LIPITOR) 10 MG tablet Take 1 tablet (10 mg total) by mouth daily. 30 tablet 5  . Benralizumab (FASENRA) 30 MG/ML SOSY Inject 30 mg as directed See admin instructions for 20 doses. Inject 30 mg under the skin every 6 weeks 1 mL 20  . cetirizine (ZYRTEC) 10 MG tablet Take 10 mg by mouth daily.    . Fluticasone-Umeclidin-Vilant (TRELEGY ELLIPTA) 100-62.5-25 MCG/INH AEPB Inhale 1 puff into the lungs daily. 1 each 0  . furosemide (LASIX) 20 MG tablet Take 1 tablet (20 mg total) by mouth daily. 30 tablet 5  . ipratropium (ATROVENT) 0.02 % nebulizer solution Use 1 vial in nebulizer as needed dx. 496 150 mL PRN  . potassium chloride (K-DUR) 10 MEQ tablet Take 2 tablets (20 mEq total) by mouth daily. 60 tablet 5  . predniSONE (DELTASONE) 10 MG tablet 1.5 tabs daily or lowest dose needed 200 tablet 0  . Prenatal Vit-Fe Sulfate-FA (PRENATAL VITAMIN PO) Take 1 capsule by mouth daily.     No current facility-administered medications on file prior to visit.    BP 128/86 (BP Location: Right Arm, Patient Position: Sitting, Cuff Size: Large)   Pulse 88   Temp (!) 96.7 F (35.9 C) (Temporal)   Resp 16   Ht 5\' 7"  (1.702 m)   Wt 195 lb (88.5 kg)   SpO2 98%   BMI 30.54 kg/m       Objective:   Physical Exam Constitutional:      Appearance: She is well-developed.  Neck:     Thyroid: No thyromegaly.  Cardiovascular:      Rate and Rhythm: Normal rate and regular rhythm.  Heart sounds: Normal heart sounds. No murmur.  Pulmonary:     Effort: Pulmonary effort is normal. No respiratory distress.     Breath sounds: Normal breath sounds. No wheezing.  Musculoskeletal:     Cervical back: Neck supple.  Skin:    General: Skin is warm and dry.  Neurological:     Mental Status: She is alert and oriented to person, place, and time.  Psychiatric:        Behavior: Behavior normal.        Thought Content: Thought content normal.        Judgment: Judgment normal.           Assessment & Plan:  Asthma- stable- management per pulmonary.  Hyperlipidemia- ldl at goal on statin, continue same.  DM2- will check A1C should be better now that she has tapered off of prednisone (reports last dose 2 weeks ago). Discussed diet/exercise/weight loss.   HTN- bp stable- only on furosemide currently. Continue same. Check bmet.   This visit occurred during the SARS-CoV-2 public health emergency.  Safety protocols were in place, including screening questions prior to the visit, additional usage of staff PPE, and extensive cleaning of exam room while observing appropriate contact time as indicated for disinfecting solutions.

## 2020-01-02 NOTE — Patient Instructions (Addendum)
Please complete lab work prior to leaving.  Continue to work on [employer], exercise and weight loss. Schedule a diabetic eye exam.   Below are two ways to schedule a Covid-19 Vaccine:  Please visit [email or web address] to register or call [phone]  Or call:  [facility] Covid-19 vaccine scheduling at [phone]

## 2020-01-03 ENCOUNTER — Telehealth: Payer: Self-pay | Admitting: Family

## 2020-01-03 LAB — HEMOGLOBIN A1C: Hgb A1c MFr Bld: 6.8 % — ABNORMAL HIGH (ref 4.6–6.5)

## 2020-01-03 NOTE — Telephone Encounter (Signed)
Potassium a little low, has she missed any doses?   If not, I would like her to take 2 tabs of potassium twice daily x 2 days, then return to 2 tabs once daily.   Repeat bmet in 1 week, dx hypokalemia.

## 2020-01-03 NOTE — Telephone Encounter (Signed)
Attempted to contact patient with results and instructions, VM full.

## 2020-01-04 ENCOUNTER — Other Ambulatory Visit: Payer: Self-pay

## 2020-01-04 DIAGNOSIS — E876 Hypokalemia: Secondary | ICD-10-CM

## 2020-01-04 NOTE — Telephone Encounter (Signed)
Patient reported she has missed some doses of her potassium. Patient advised to take 2 tabs bid today and tomorrow and resume 2 tabs daily after that. Appointment scheduled for repeat bmet on 01-16-2020 at her request (will be out of town next week and is off on wednesdays)

## 2020-01-14 ENCOUNTER — Telehealth: Payer: Self-pay | Admitting: Internal Medicine

## 2020-01-14 NOTE — Telephone Encounter (Signed)
Berna Bue Order: 30mg  #1 prefilled syringe Ordered date: 01/14/2020 Expected date of arrival: 01/16/2020 Ordered by: Desmond Dike, [place]  Specialty Pharmacy: [facility]

## 2020-01-16 ENCOUNTER — Other Ambulatory Visit: Payer: BC Managed Care – PPO

## 2020-01-16 NOTE — Telephone Encounter (Signed)
Fasenra Shipment Received:  30mg  #1 prefilled syringe Medication arrival date: 01/16/2020 Lot #: [id number] Exp date: 02/2021 Received by: Desmond Dike, [place]

## 2020-01-23 ENCOUNTER — Ambulatory Visit: Payer: BC Managed Care – PPO

## 2020-01-23 ENCOUNTER — Telehealth: Payer: Self-pay | Admitting: Internal Medicine

## 2020-01-24 ENCOUNTER — Ambulatory Visit (INDEPENDENT_AMBULATORY_CARE_PROVIDER_SITE_OTHER): Payer: BC Managed Care – PPO

## 2020-01-24 ENCOUNTER — Ambulatory Visit: Payer: BC Managed Care – PPO

## 2020-01-24 ENCOUNTER — Ambulatory Visit: Payer: BC Managed Care – PPO | Admitting: Internal Medicine

## 2020-01-24 ENCOUNTER — Other Ambulatory Visit: Payer: Self-pay

## 2020-01-24 DIAGNOSIS — J4550 Severe persistent asthma, uncomplicated: Secondary | ICD-10-CM | POA: Diagnosis not present

## 2020-01-24 MED ORDER — BENRALIZUMAB 30 MG/ML ~~LOC~~ SOSY
30.0000 mg | PREFILLED_SYRINGE | Freq: Once | SUBCUTANEOUS | Status: AC
  Administered 2020-01-24: 30 mg via SUBCUTANEOUS

## 2020-01-24 NOTE — Progress Notes (Signed)
Have you been hospitalized within the last 10 days?  No Do you have a fever?  No Do you have a cough?  No Do you have a headache or sore throat? No  

## 2020-01-28 NOTE — Telephone Encounter (Signed)
Error

## 2020-02-13 ENCOUNTER — Telehealth: Payer: Self-pay | Admitting: Internal Medicine

## 2020-02-13 NOTE — Telephone Encounter (Signed)
Received a fax that the pt's PA for Berna Bue has expired. A renewal PA has been started on Cover My Meds. [id number]. Will await PA decision.

## 2020-02-15 ENCOUNTER — Encounter: Payer: BC Managed Care – PPO | Admitting: Family

## 2020-02-15 ENCOUNTER — Other Ambulatory Visit: Payer: BC Managed Care – PPO

## 2020-02-18 NOTE — Telephone Encounter (Signed)
Checked Cover My Meds. PA has been approved through 02/12/2021. Nothing further needed.

## 2020-02-27 ENCOUNTER — Encounter: Payer: BC Managed Care – PPO | Admitting: Family

## 2020-03-06 ENCOUNTER — Ambulatory Visit: Payer: BC Managed Care – PPO | Admitting: Internal Medicine

## 2020-03-07 LAB — HM MAMMOGRAPHY

## 2020-03-10 ENCOUNTER — Other Ambulatory Visit: Payer: Self-pay

## 2020-03-10 ENCOUNTER — Ambulatory Visit (INDEPENDENT_AMBULATORY_CARE_PROVIDER_SITE_OTHER): Payer: BC Managed Care – PPO

## 2020-03-10 ENCOUNTER — Ambulatory Visit (INDEPENDENT_AMBULATORY_CARE_PROVIDER_SITE_OTHER): Payer: BC Managed Care – PPO | Admitting: Internal Medicine

## 2020-03-10 ENCOUNTER — Encounter: Payer: Self-pay | Admitting: Internal Medicine

## 2020-03-10 VITALS — BP 106/68 | HR 96 | Temp 97.3°F | Ht 67.0 in | Wt 192.0 lb

## 2020-03-10 DIAGNOSIS — F1920 Other psychoactive substance dependence, uncomplicated: Secondary | ICD-10-CM

## 2020-03-10 DIAGNOSIS — J4521 Mild intermittent asthma with (acute) exacerbation: Secondary | ICD-10-CM | POA: Diagnosis not present

## 2020-03-10 DIAGNOSIS — J4489 Other specified chronic obstructive pulmonary disease: Secondary | ICD-10-CM

## 2020-03-10 DIAGNOSIS — J449 Chronic obstructive pulmonary disease, unspecified: Secondary | ICD-10-CM

## 2020-03-10 DIAGNOSIS — J4551 Severe persistent asthma with (acute) exacerbation: Secondary | ICD-10-CM | POA: Diagnosis not present

## 2020-03-10 MED ORDER — PREDNISONE 10 MG PO TABS: ORAL_TABLET | ORAL | 0 refills | Status: DC

## 2020-03-10 MED ORDER — METHYLPREDNISOLONE ACETATE 80 MG/ML IJ SUSP
80.0000 mg | Freq: Once | INTRAMUSCULAR | Status: AC
  Administered 2020-03-10: 80 mg via INTRAMUSCULAR

## 2020-03-10 MED ORDER — IPRATROPIUM BROMIDE 0.02 % IN SOLN: RESPIRATORY_TRACT | 99 refills | Status: DC

## 2020-03-10 MED ORDER — ALBUTEROL SULFATE (2.5 MG/3ML) 0.083% IN NEBU: INHALATION_SOLUTION | RESPIRATORY_TRACT | 99 refills | Status: DC

## 2020-03-10 MED ORDER — TRELEGY ELLIPTA 100-62.5-25 MCG/INH IN AEPB: 1.0000 | INHALATION_SPRAY | Freq: Every day | RESPIRATORY_TRACT | 12 refills | Status: DC

## 2020-03-10 NOTE — Progress Notes (Signed)
Patient ID: [name], female    DOB: 05/20/1966, 54 y.o.   MRN: [id number] F followed for moderate persistent asthma with COPD (requiring frequent steroids , Failed Xolair, , hx nasal polyps, allergic rhinitis, hx DVT PFT- 09/09/11- FEV1 1.64/ 54%, FEV1/FVC 0.54, FEF25-75% 0.74/ 22%, insignif response to bronchodilator TLC 0.74, DLCO 72%. Moderate obstructive disease, mild restriction, mild reduction of DLCO. Office spirometry- mild obstructive airways disease. FEV1 1.95/73%, FVC 3.01/93%, FEV1/FVC 0.65, FEF 25-75% 1.26/38%.   -----------------------------------------------------------------  07/16/2019- 54 year old female never smoker followed for moderate persistent asthma with COPD (frequent steroids, failed Xolair), history nasal polyps, allergic rhinitis, history DVT Albuterol HFA, neb albuterol/ atrovent, EpiPen, Trelegy,  prednisone 5 mg, Fasenra -----pt states breathing varies; pt reports stuffiness at night, seasonal allergy symptoms On Fasenra x 6 months. Fewer real attacks. Hasn't tried to push down prednisone, and may be not recognizing how much Berna Bue is doing. Has been taking pred 20 mg most days- discussed steroid long term effects and adrenal impact again. Little phlegm. Occ wakes from wheeze. Using rescue most days. Bothered by nasal conge as she lies down.  03/10/20- 54 year old female never smoker followed for moderate persistent asthma with COPD (frequent steroids, failed Xolair), history nasal polyps, allergic rhinitis, history DVT Albuterol HFA, neb albuterol/ atrovent, EpiPen, Trelegy,  prednisone 10 mg, [place] renewal PA approved in Feb. -----f/u Severe persistent asthma. Patient stated some wheezing today.  Taking prednisone 2o qod. Berna Bue helps but doesn't keep her from needing some prednisone.  Has had 1 Covx.  Mild increase in wheezing blamed on Spring weather- doesn't feel sick.  Review of Systems-See HPI     + = positive Constitutional:   No  weight loss, night sweats,  Fevers, chills, fatigue, lassitude. HEENT:   No headaches,  Difficulty swallowing,  Tooth/dental problems,  Sore throat,                No sneezing, itching, ear ache,, + post nasal drip. + nasal congestion. CV:  No chest pain,  Orthopnea, PND, swelling in lower extremities, anasarca, dizziness, palpitations GI  No heartburn, indigestion, abdominal pain, nausea, vomiting, Resp:Usually some shortness of breath with exertion, not at rest.  No excess mucus, + productive cough,          + non-productive cough,  No coughing up of blood.   change in color of mucus.  + wheezing.   Skin: no rash or lesions. GU: . MS:  No joint pain, + swelling.   Neuro- nothing unusual Psych:  No change in mood or affect. No depression or anxiety.  No memory loss.  Objective:   Physical Exam General- Alert, Oriented, Affect-appropriate, Distress- none acute  Relaxed and conversational   + overweight, + pink cheeks/ steroid facies Skin- rash-none, lesions- none, excoriation- none Lymphadenopathy- none Head- atraumatic            Eyes- Gross vision intact, PERRLA, conjunctivae clear secretions            Ears- Hearing, canals normal            Nose- +Turbinate edema, No-Septal dev, mucus, , erosion, perforation. No definite polyps             Throat- Mallampati III-IV, mucosa -not red , drainage- none, tonsils- atrophic Neck- flexible , trachea midline, no stridor , thyroid nl, carotid no bruit Chest - symmetrical excursion , unlabored           Heart/CV- RRR , no murmur , no gallop  ,  no rub, nl s1 s2                           - JVD- none , edema 1+ , stasis changes- none, varices- none           Lung-  wheeze+ mild I&E,  unlabored, cough- none , dullness-none, rub- none           Chest wall-  Abd-  Br/ Gen/ Rectal- Not done, not indicated Extrem- cyanosis- none, clubbing, none, atrophy- none, strength- nl.  Neuro- grossly intact to observation

## 2020-03-10 NOTE — Patient Instructions (Signed)
Med refills e-sent  Order- Depo 80 dx asthma exacerbation  Order- CXR   Dx Asthma exacerbation  Please call if we can help

## 2020-03-17 ENCOUNTER — Telehealth: Payer: Self-pay | Admitting: Internal Medicine

## 2020-03-17 MED ORDER — FASENRA 30 MG/ML ~~LOC~~ SOSY: 30.0000 mg | PREFILLED_SYRINGE | SUBCUTANEOUS | 8 refills | Status: DC

## 2020-03-17 NOTE — Telephone Encounter (Signed)
Called [facility] to set up shipment. Was advised that the prescription they have on file has expired. A new rx has been sent in. Will call back later this week to set up shipment.

## 2020-03-25 NOTE — Telephone Encounter (Signed)
I did not get a chance to call the pharmacy back to set up shipment. We will use a sample from the office so the pt stays on schedule.

## 2020-03-26 ENCOUNTER — Ambulatory Visit: Payer: BC Managed Care – PPO

## 2020-03-26 ENCOUNTER — Telehealth: Payer: Self-pay | Admitting: Internal Medicine

## 2020-03-27 NOTE — Telephone Encounter (Signed)
Called and spoke with Patient. Patient is receiving her second covid vaccine 03/28/20.  Patient is scheduled for Fasenr shot 04/04/20.  Patient wanted to make sure she did not need to reschedule her fasenra.  Advised Patient to get Berna Bue shot in the opposite arm of Covid vaccine. Understanding stated.  Nothing further at this time.

## 2020-04-01 ENCOUNTER — Other Ambulatory Visit: Payer: Self-pay

## 2020-04-01 DIAGNOSIS — F1920 Other psychoactive substance dependence, uncomplicated: Secondary | ICD-10-CM | POA: Insufficient documentation

## 2020-04-01 NOTE — Assessment & Plan Note (Signed)
Usually has some wheeze. Only recent trigger seems to be season change. Plan- continue Fasenra, refill Trelegy and neb solution, prednisone. Depo 80 today.

## 2020-04-01 NOTE — Assessment & Plan Note (Signed)
Again discussed steroid dependence, long-term steroid use, adrenal insufficiency. Recognize impact on bones, glucose, immunosuppression. She will also discuss with PCP. Consider Endocrine eval, but may not have much to say unless we can control her asthma better.

## 2020-04-02 ENCOUNTER — Telehealth: Payer: Self-pay | Admitting: Internal Medicine

## 2020-04-02 NOTE — Telephone Encounter (Signed)
Pt is currently receiving Fasenra 30mg q8w at our office. We are starting to transition our patients to self administer at home. Please advise if you believe pt would be a good candidate for this. Thanks.  

## 2020-04-02 NOTE — Telephone Encounter (Signed)
Please disregard message

## 2020-04-04 ENCOUNTER — Other Ambulatory Visit: Payer: BC Managed Care – PPO

## 2020-04-04 ENCOUNTER — Ambulatory Visit (INDEPENDENT_AMBULATORY_CARE_PROVIDER_SITE_OTHER): Payer: BC Managed Care – PPO

## 2020-04-04 ENCOUNTER — Encounter: Payer: BC Managed Care – PPO | Admitting: Family

## 2020-04-04 ENCOUNTER — Other Ambulatory Visit: Payer: Self-pay

## 2020-04-04 DIAGNOSIS — J4551 Severe persistent asthma with (acute) exacerbation: Secondary | ICD-10-CM

## 2020-04-04 MED ORDER — BENRALIZUMAB 30 MG/ML ~~LOC~~ SOSY
30.0000 mg | PREFILLED_SYRINGE | Freq: Once | SUBCUTANEOUS | Status: AC
  Administered 2020-04-04: 30 mg via SUBCUTANEOUS

## 2020-04-04 NOTE — Progress Notes (Signed)
All questions were answered by the patient before medication was administered. Have you been hospitalized in the last 10 days? No Do you have a fever? No Do you have a cough? No Do you have a headache or sore throat? No  

## 2020-04-07 ENCOUNTER — Other Ambulatory Visit: Payer: Self-pay | Admitting: Obstetrics and Gynecology

## 2020-04-07 ENCOUNTER — Other Ambulatory Visit: Payer: Self-pay

## 2020-04-07 ENCOUNTER — Ambulatory Visit (INDEPENDENT_AMBULATORY_CARE_PROVIDER_SITE_OTHER): Payer: BC Managed Care – PPO | Admitting: Family

## 2020-04-07 ENCOUNTER — Encounter: Payer: Self-pay | Admitting: Family

## 2020-04-07 ENCOUNTER — Telehealth: Payer: Self-pay | Admitting: Family

## 2020-04-07 VITALS — BP 139/99 | HR 84 | Temp 96.7°F | Resp 19 | Ht 67.0 in | Wt 191.0 lb

## 2020-04-07 DIAGNOSIS — Z0000 Encounter for general adult medical examination without abnormal findings: Secondary | ICD-10-CM | POA: Diagnosis not present

## 2020-04-07 DIAGNOSIS — Z87898 Personal history of other specified conditions: Secondary | ICD-10-CM | POA: Diagnosis not present

## 2020-04-07 DIAGNOSIS — E785 Hyperlipidemia, unspecified: Secondary | ICD-10-CM | POA: Diagnosis not present

## 2020-04-07 DIAGNOSIS — E119 Type 2 diabetes mellitus without complications: Secondary | ICD-10-CM | POA: Diagnosis not present

## 2020-04-07 DIAGNOSIS — I10 Essential (primary) hypertension: Secondary | ICD-10-CM

## 2020-04-07 DIAGNOSIS — R928 Other abnormal and inconclusive findings on diagnostic imaging of breast: Secondary | ICD-10-CM

## 2020-04-07 LAB — CBC WITH DIFFERENTIAL/PLATELET
Basophils Absolute: 0 10*3/uL (ref 0.0–0.1)
Basophils Relative: 0.2 % (ref 0.0–3.0)
Eosinophils Absolute: 0 10*3/uL (ref 0.0–0.7)
Eosinophils Relative: 0 % (ref 0.0–5.0)
HCT: 41.2 % (ref 36.0–46.0)
Hemoglobin: 13.4 g/dL (ref 12.0–15.0)
Lymphocytes Relative: 31.3 % (ref 12.0–46.0)
Lymphs Abs: 1.9 10*3/uL (ref 0.7–4.0)
MCHC: 32.6 g/dL (ref 30.0–36.0)
MCV: 80.1 fl (ref 78.0–100.0)
Monocytes Absolute: 0.6 10*3/uL (ref 0.1–1.0)
Monocytes Relative: 8.9 % (ref 3.0–12.0)
Neutro Abs: 3.7 10*3/uL (ref 1.4–7.7)
Neutrophils Relative %: 59.6 % (ref 43.0–77.0)
Platelets: 270 10*3/uL (ref 150.0–400.0)
RBC: 5.14 Mil/uL — ABNORMAL HIGH (ref 3.87–5.11)
RDW: 14.3 % (ref 11.5–15.5)
WBC: 6.2 10*3/uL (ref 4.0–10.5)

## 2020-04-07 LAB — LIPID PANEL
Cholesterol: 190 mg/dL (ref 0–200)
HDL: 53.7 mg/dL (ref >39.00)
LDL Cholesterol: 113 mg/dL — ABNORMAL HIGH (ref 0–99)
NonHDL: 135.88
Total CHOL/HDL Ratio: 4
Triglycerides: 116 mg/dL (ref 0.0–149.0)
VLDL: 23.2 mg/dL (ref 0.0–40.0)

## 2020-04-07 LAB — BASIC METABOLIC PANEL
BUN: 13 mg/dL (ref 6–23)
CO2: 28 mEq/L (ref 19–32)
Calcium: 9 mg/dL (ref 8.4–10.5)
Chloride: 105 mEq/L (ref 96–112)
Creatinine, Ser: 0.76 mg/dL (ref 0.40–1.20)
GFR: 96.55 mL/min (ref >60.00)
Glucose, Bld: 98 mg/dL (ref 70–99)
Potassium: 3.9 mEq/L (ref 3.5–5.1)
Sodium: 141 mEq/L (ref 135–145)

## 2020-04-07 LAB — HEMOGLOBIN A1C: Hgb A1c MFr Bld: 6.5 % (ref 4.6–6.5)

## 2020-04-07 LAB — HEPATIC FUNCTION PANEL
ALT: 18 U/L (ref 0–35)
AST: 16 U/L (ref 0–37)
Albumin: 3.7 g/dL (ref 3.5–5.2)
Alkaline Phosphatase: 119 U/L — ABNORMAL HIGH (ref 39–117)
Bilirubin, Direct: 0.1 mg/dL (ref 0.0–0.3)
Total Bilirubin: 0.4 mg/dL (ref 0.2–1.2)
Total Protein: 6.3 g/dL (ref 6.0–8.3)

## 2020-04-07 LAB — TSH: TSH: 1.65 u[IU]/mL (ref 0.35–4.50)

## 2020-04-07 MED ORDER — SPIRONOLACTONE 25 MG PO TABS: 25.0000 mg | ORAL_TABLET | Freq: Every day | ORAL | 5 refills | Status: DC

## 2020-04-07 NOTE — Patient Instructions (Addendum)
Please remember to get your second shingles vaccine (Shingrix) at work and schedule a routine eye exam.  Stop lasix, stop Kdur (potassium). Start aldactone once daily in the AM. Please complete lab work prior to leaving.   Preventive Care 80-53 Years Old, Female Preventive care refers to visits with your health care provider and lifestyle choices that can promote health and wellness. This includes:  A yearly physical exam. This may also be called an annual well check.  Regular dental visits and eye exams.  Immunizations.  Screening for certain conditions.  Healthy lifestyle choices, such as eating a healthy diet, getting regular exercise, not using drugs or products that contain nicotine and tobacco, and limiting alcohol use. What can I expect for my preventive care visit? Physical exam Your health care provider will check your:  Height and weight. This may be used to calculate body mass index (BMI), which tells if you are at a healthy weight.  Heart rate and blood pressure.  Skin for abnormal spots. Counseling Your health care provider may ask you questions about your:  Alcohol, tobacco, and drug use.  Emotional well-being.  Home and relationship well-being.  Sexual activity.  Eating habits.  Work and work Statistician.  Method of birth control.  Menstrual cycle.  Pregnancy history. What immunizations do I need?  Influenza (flu) vaccine  This is recommended every year. Tetanus, diphtheria, and pertussis (Tdap) vaccine  You may need a Td booster every 10 years. Varicella (chickenpox) vaccine  You may need this if you have not been vaccinated. Zoster (shingles) vaccine  You may need this after age 24. Measles, mumps, and rubella (MMR) vaccine  You may need at least one dose of MMR if you were born in 1957 or later. You may also need a second dose. Pneumococcal conjugate (PCV13) vaccine  You may need this if you have certain conditions and were not  previously vaccinated. Pneumococcal polysaccharide (PPSV23) vaccine  You may need one or two doses if you smoke cigarettes or if you have certain conditions. Meningococcal conjugate (MenACWY) vaccine  You may need this if you have certain conditions. Hepatitis A vaccine  You may need this if you have certain conditions or if you travel or work in places where you may be exposed to hepatitis A. Hepatitis B vaccine  You may need this if you have certain conditions or if you travel or work in places where you may be exposed to hepatitis B. Haemophilus influenzae type b (Hib) vaccine  You may need this if you have certain conditions. Human papillomavirus (HPV) vaccine  If recommended by your health care provider, you may need three doses over 6 months. You may receive vaccines as individual doses or as more than one vaccine together in one shot (combination vaccines). Talk with your health care provider about the risks and benefits of combination vaccines. What tests do I need? Blood tests  Lipid and cholesterol levels. These may be checked every 5 years, or more frequently if you are over 53 years old.  Hepatitis C test.  Hepatitis B test. Screening  Lung cancer screening. You may have this screening every year starting at age 53 if you have a 30-pack-year history of smoking and currently smoke or have quit within the past 15 years.  Colorectal cancer screening. All adults should have this screening starting at age 53 and continuing until age 79. Your health care provider may recommend screening at age 53 if you are at increased risk. You will have  tests every 1-10 years, depending on your results and the type of screening test.  Diabetes screening. This is done by checking your blood sugar (glucose) after you have not eaten for a while (fasting). You may have this done every 1-3 years.  Mammogram. This may be done every 1-2 years. Talk with your health care provider about when you  should start having regular mammograms. This may depend on whether you have a family history of breast cancer.  BRCA-related cancer screening. This may be done if you have a family history of breast, ovarian, tubal, or peritoneal cancers.  Pelvic exam and Pap test. This may be done every 3 years starting at age 53. Starting at age 18, this may be done every 5 years if you have a Pap test in combination with an HPV test. Other tests  Sexually transmitted disease (STD) testing.  Bone density scan. This is done to screen for osteoporosis. You may have this scan if you are at high risk for osteoporosis. Follow these instructions at home: Eating and drinking  Eat a diet that includes fresh fruits and vegetables, whole grains, lean protein, and low-fat dairy.  Take vitamin and mineral supplements as recommended by your health care provider.  Do not drink alcohol if: ? Your health care provider tells you not to drink. ? You are pregnant, may be pregnant, or are planning to become pregnant.  If you drink alcohol: ? Limit how much you have to 0-1 drink a day. ? Be aware of how much alcohol is in your drink. In the [place] one drink equals one 12 oz bottle of beer (355 mL), one 5 oz glass of wine (148 mL), or one 1 oz glass of hard liquor (44 mL). Lifestyle  Take daily care of your teeth and gums.  Stay active. Exercise for at least 30 minutes on 5 or more days each week.  Do not use any products that contain nicotine or tobacco, such as cigarettes, e-cigarettes, and chewing tobacco. If you need help quitting, ask your health care provider.  If you are sexually active, practice safe sex. Use a condom or other form of birth control (contraception) in order to prevent pregnancy and STIs (sexually transmitted infections).  If told by your health care provider, take low-dose aspirin daily starting at age 53. What's next?  Visit your health care provider once a year for a well check  visit.  Ask your health care provider how often you should have your eyes and teeth checked.  Stay up to date on all vaccines. This information is not intended to replace advice given to you by your health care provider. Make sure you discuss any questions you have with your health care provider. Document Revised: 08/17/2018 Document Reviewed: 08/17/2018 Elsevier Patient Education  2020 [employer].

## 2020-04-07 NOTE — Telephone Encounter (Signed)
Please call Dr. Malachi Carl office to request a copy of her mammogram report.

## 2020-04-07 NOTE — Progress Notes (Signed)
Subjective:    Patient ID: [name], female    DOB: 1966-09-13, 54 y.o.   MRN: [id number]  HPI   Patient is a 54 yr old female who presents today for cpx.  Immunizations: tdap 2016, had shingrix #1 Diet: healthy Wt Readings from Last 3 Encounters:  04/07/20 191 lb (86.6 kg)  03/10/20 192 lb (87.1 kg)  01/02/20 195 lb (88.5 kg)  Exercise:  Walks in the evenings and at work.  Colonoscopy: 11/30/17 (due 12/05/20)  Pap Smear:  2019 Mammogram: 03/08/19 Vision: due Dental:  Up to date  DM2-  Lab Results  Component Value Date   HGBA1C 6.8 (H) 01/02/2020   HGBA1C 6.5 (H) 08/17/2019   HGBA1C 6.7 (H) 10/23/2018   Lab Results  Component Value Date   MICROALBUR 3.1 (H) 01/02/2020   LDLCALC 90 08/17/2019   CREATININE 0.78 01/02/2020   Elevated blood pressure reading-  BP Readings from Last 3 Encounters:  04/07/20 (!) 139/99  03/10/20 106/68  01/02/20 128/86       Review of Systems  Constitutional: Negative for unexpected weight change.  HENT: Negative for hearing loss and rhinorrhea.   Eyes: Negative for visual disturbance.  Respiratory: Negative for cough and shortness of breath.   Cardiovascular: Negative for chest pain (reports one episode of brief chest pain 1 month ago.  Was laying down.  Resolved with rolaids. ).  Gastrointestinal: Negative for blood in stool, constipation, diarrhea and nausea.  Genitourinary: Negative for dysuria and frequency.  Musculoskeletal: Negative for arthralgias and myalgias.  Skin: Negative for rash.  Hematological: Negative for adenopathy.  Psychiatric/Behavioral:       Denies depression/anxiety   Past Medical History:  Diagnosis Date  . Allergic rhinitis, cause unspecified   . Asthma   . Diabetes type 2, controlled ([place])   . History of DVT (deep vein thrombosis) 2005   Pt reports blood clot below knee ?unsure which leg.  . Nasal polyps   . Unspecified essential hypertension      Social History   Socioeconomic  History  . Marital status: Married    Spouse name: Not on file  . Number of children: Not on file  . Years of education: Not on file  . Highest education level: Not on file  Occupational History  . Occupation: Therapist, art club  Tobacco Use  . Smoking status: Never Smoker  . Smokeless tobacco: Never Used  . Tobacco comment: Never Used Tobacco  Substance and Sexual Activity  . Alcohol use: Yes    Alcohol/week: 0.0 standard drinks    Comment: 1 glass of wine per year  . Drug use: No  . Sexual activity: Yes    Birth control/protection: None  Other Topics Concern  . 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     Social Determinants of Health   Financial Resource Strain:   . Difficulty of Paying Living Expenses:   Food Insecurity:   . Worried About Charity fundraiser in the Last Year:   . Arboriculturist in the Last Year:   Transportation Needs:   . Film/video editor (Medical):   [name] Lack of Transportation (Non-Medical):   Physical Activity:   . Days of Exercise per Week:   . Minutes of Exercise  per Session:   Stress:   . Feeling of Stress :   Social Connections:   . Frequency of Communication with Friends and Family:   . Frequency of Social Gatherings with Friends and Family:   . Attends Religious Services:   . Active Member of Clubs or Organizations:   . Attends Archivist Meetings:   [name] Marital Status:   Intimate Partner Violence:   . Fear of Current or Ex-Partner:   . Emotionally Abused:   [name] Physically Abused:   . Sexually Abused:     Past Surgical History:  Procedure Laterality Date  . LASIK Bilateral 2008  . NASAL SINUS SURGERY  8841   Dr Erik Obey  . OVARIAN CYST REMOVAL  02/2016   pt reported-- Dr Philis Pique  . UTERINE FIBROID SURGERY  2005    Family History  Problem Relation Age of  Onset  . Cancer Mother 1       ?carcinoid tumor (had surgical removal)  . Diabetes Mother        borderline  . Stomach cancer Mother   . Heart disease Father        died 60, from MI, had hx of ETOH abuse and liver disease  . Thyroid cancer Sister        (diagnosed at 87) half sister (same dad)   . Diabetes type II Brother   . Kidney disease Neg Hx   . Hypertension Neg Hx   . Hyperlipidemia Neg Hx   . Colon cancer Neg Hx   . Esophageal cancer Neg Hx   . Rectal cancer Neg Hx     Allergies  Allergen Reactions  . Aspirin     wheezing  . Latex   . Penicillins Hives  . Shellfish Allergy     Current Outpatient Medications on File Prior to Visit  Medication Sig Dispense Refill  . albuterol (PROVENTIL) (2.5 MG/3ML) 0.083% nebulizer solution Use 1 vial in nebulizer as needed dx:496 150 mL PRN  . albuterol (VENTOLIN HFA) 108 (90 Base) MCG/ACT inhaler Inhale 2 puffs into the lungs every 6 (six) hours as needed for wheezing or shortness of breath. 1 Inhaler 6  . atorvastatin (LIPITOR) 10 MG tablet Take 1 tablet (10 mg total) by mouth daily. 30 tablet 5  . Benralizumab (FASENRA) 30 MG/ML SOSY Inject 1 mL (30 mg total) as directed every 6 (six) weeks. 1 mL 8  . cetirizine (ZYRTEC) 10 MG tablet Take 10 mg by mouth daily.    . Fluticasone-Umeclidin-Vilant (TRELEGY ELLIPTA) 100-62.5-25 MCG/INH AEPB Inhale 1 puff into the lungs daily. Rinse mouth 60 each 12  . furosemide (LASIX) 20 MG tablet Take 1 tablet (20 mg total) by mouth daily. 30 tablet 5  . ipratropium (ATROVENT) 0.02 % nebulizer solution Use 1 vial in nebulizer as needed dx. 496 150 mL PRN  . potassium chloride (K-DUR) 10 MEQ tablet Take 2 tablets (20 mEq total) by mouth daily. 60 tablet 5  . predniSONE (DELTASONE) 10 MG tablet 1.5 tabs daily or lowest dose needed 200 tablet 0  . Prenatal Vit-Fe Sulfate-FA (PRENATAL VITAMIN PO) Take 1 capsule by mouth daily.     No current facility-administered medications on file prior to visit.     BP (!) 139/99 (BP Location: Right Arm, Patient Position: Sitting, Cuff Size: Large)   Pulse 84   Temp (!) 96.7 F (35.9 C) (Temporal)   Resp 19   Ht '5\' 7"'  (1.702 m)   Wt 191 lb (86.6 kg)  LMP 06/22/2018   SpO2 97%   BMI 29.91 kg/m       Objective:   Physical Exam  Physical Exam  Constitutional: She is oriented to person, place, and time. She appears well-developed and well-nourished. No distress.  HENT:  Head: Normocephalic and atraumatic.  Right Ear: Tympanic membrane and ear canal normal.  Left Ear: Tympanic membrane and ear canal normal.  Mouth/Throat: Oropharynx is clear and moist.  Eyes: Pupils are equal, round, and reactive to light. No scleral icterus.  Neck: Normal range of motion. No thyromegaly present.  Cardiovascular: Normal rate and regular rhythm.   No murmur heard. Pulmonary/Chest: Effort normal and breath sounds normal. No respiratory distress. He has no wheezes. She has no rales. She exhibits no tenderness.  Abdominal: Soft. Bowel sounds are normal. She exhibits no distension and no mass. There is no tenderness. There is no rebound and no guarding.  Musculoskeletal: She exhibits no edema.  Lymphadenopathy:    She has no cervical adenopathy.  Neurological: She is alert and oriented to person, place, and time. She has normal patellar reflexes. She exhibits normal muscle tone. Coordination normal.  Skin: Skin is warm and dry.  Psychiatric: She has a normal mood and affect. Her behavior is normal. Judgment and thought content normal.  Breast/Pelvic: deferred         Assessment & Plan:    Preventative care- encouraged pt to continue to work on healthy diet and regular exercise. Obtain routine lab work.  Will request copy of pap from her GYN. She knows to call GI in November to schedule a December colonoscopy. Pap up to date.  She will receive Shingrix #2 from her employer. Covid vaccine, Tetanus up to date.  History of chest pain- atypical. EKG  performed. EKG tracing is personally reviewed.  EKG notes NSR.  No acute changes.  She is advised to go to the ED if recurrent chest pain.    HTN- bp is elevated.  She states that she takes lasix sporadically because it causes increased urination which is difficult with her work schedule.  Last dose of lasix 3 days ago. Notes that she is currently not taking prednisone. Peripheral edema is better off of prednisone. Pt is advised to d/c lasix, Kdur.  Start Aldactone. Follow up in 1-2 weeks for repeat bp check and met.   DM2- obtain follow up A1C.   This visit occurred during the SARS-CoV-2 public health emergency.  Safety protocols were in place, including screening questions prior to the visit, additional usage of staff PPE, and extensive cleaning of exam room while observing appropriate contact time as indicated for disinfecting solutions.        Assessment & Plan:

## 2020-04-07 NOTE — Telephone Encounter (Signed)
Medical records release request faxed

## 2020-04-17 ENCOUNTER — Other Ambulatory Visit: Payer: BC Managed Care – PPO

## 2020-04-18 ENCOUNTER — Ambulatory Visit: Payer: BC Managed Care – PPO

## 2020-04-18 ENCOUNTER — Other Ambulatory Visit: Payer: Self-pay

## 2020-04-18 ENCOUNTER — Ambulatory Visit
Admission: RE | Admit: 2020-04-18 | Discharge: 2020-04-18 | Disposition: A | Discharge status: Home / Self Care | Payer: BC Managed Care – PPO | Source: Ambulatory Visit | Attending: Obstetrics and Gynecology | Admitting: Obstetrics and Gynecology

## 2020-04-18 DIAGNOSIS — R928 Other abnormal and inconclusive findings on diagnostic imaging of breast: Secondary | ICD-10-CM

## 2020-04-23 ENCOUNTER — Ambulatory Visit: Payer: BC Managed Care – PPO | Admitting: Family

## 2020-04-23 DIAGNOSIS — Z0289 Encounter for other administrative examinations: Secondary | ICD-10-CM

## 2020-05-12 ENCOUNTER — Ambulatory Visit (INDEPENDENT_AMBULATORY_CARE_PROVIDER_SITE_OTHER): Payer: BC Managed Care – PPO | Admitting: Family

## 2020-05-12 ENCOUNTER — Encounter: Payer: Self-pay | Admitting: Family

## 2020-05-12 ENCOUNTER — Other Ambulatory Visit: Payer: Self-pay

## 2020-05-12 VITALS — BP 131/74 | HR 94 | Temp 97.8°F | Resp 16 | Ht 67.0 in | Wt 186.0 lb

## 2020-05-12 DIAGNOSIS — M79605 Pain in left leg: Secondary | ICD-10-CM

## 2020-05-12 DIAGNOSIS — I10 Essential (primary) hypertension: Secondary | ICD-10-CM | POA: Diagnosis not present

## 2020-05-12 DIAGNOSIS — W19XXXA Unspecified fall, initial encounter: Secondary | ICD-10-CM

## 2020-05-12 LAB — BASIC METABOLIC PANEL
BUN: 10 mg/dL (ref 6–23)
CO2: 27 mEq/L (ref 19–32)
Calcium: 9.1 mg/dL (ref 8.4–10.5)
Chloride: 104 mEq/L (ref 96–112)
Creatinine, Ser: 0.72 mg/dL (ref 0.40–1.20)
GFR: 102.72 mL/min (ref >60.00)
Glucose, Bld: 109 mg/dL — ABNORMAL HIGH (ref 70–99)
Potassium: 3.9 mEq/L (ref 3.5–5.1)
Sodium: 139 mEq/L (ref 135–145)

## 2020-05-12 NOTE — Patient Instructions (Signed)
Please complete lab work prior to leaving.   

## 2020-05-12 NOTE — Progress Notes (Signed)
Subjective:    Patient ID: [name], female    DOB: 12-04-1966, 54 y.o.   MRN: [id number]  HPI  Patient is a 54 yr old female who presents today to discuss left leg pain.  She slipped on a wet floor at [employer] and landed on her left knee. The fall occurred 1 week ago. She reports that her left knee was originally swollen, but the swelling has gone down. She denies current knee pain or pain with walking. She is most concerned about some bruising on her left shin.  She reports some tenderness to palpation in this area.  HTN- last visit we d/c'd lasix/kdur and started aldactone.  BP Readings from Last 3 Encounters:  05/12/20 131/74  04/07/20 (!) 139/99  03/10/20 106/68     Review of Systems See HPI  Past Medical History:  Diagnosis Date  . Allergic rhinitis, cause unspecified   . Asthma   . Diabetes type 2, controlled ([place])   . History of DVT (deep vein thrombosis) 2005   Pt reports blood clot below knee ?unsure which leg.  . Nasal polyps   . Unspecified essential hypertension      Social History   Socioeconomic History  . Marital status: Married    Spouse name: Not on file  . Number of children: Not on file  . Years of education: Not on file  . Highest education level: Not on file  Occupational History  . Occupation: Therapist, art club  Tobacco Use  . Smoking status: Never Smoker  . Smokeless tobacco: Never Used  . Tobacco comment: Never Used Tobacco  Substance and Sexual Activity  . Alcohol use: Yes    Alcohol/week: 0.0 standard drinks    Comment: 1 glass of wine per year  . Drug use: No  . Sexual activity: Yes    Birth control/protection: None  Other Topics Concern  . 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     Social Determinants of Health   Financial Resource Strain:     . Difficulty of Paying Living Expenses:   Food Insecurity:   . Worried About Charity fundraiser in the Last Year:   . Arboriculturist in the Last Year:   Transportation Needs:   . Film/video editor (Medical):   [name] Lack of Transportation (Non-Medical):   Physical Activity:   . Days of Exercise per Week:   . Minutes of Exercise per Session:   Stress:   . Feeling of Stress :   Social Connections:   . Frequency of Communication with Friends and Family:   . Frequency of Social Gatherings with Friends and Family:   . Attends Religious Services:   . Active Member of Clubs or Organizations:   . Attends Archivist Meetings:   [name] Marital Status:   Intimate Partner Violence:   . Fear of Current or Ex-Partner:   . Emotionally Abused:   [name] Physically Abused:   . Sexually Abused:     Past Surgical History:  Procedure Laterality Date  . LASIK Bilateral 2008  . NASAL SINUS SURGERY  [id number]   Dr Erik Obey  . OVARIAN CYST REMOVAL  02/2016   pt reported-- Dr Philis Pique  . UTERINE FIBROID SURGERY  2005  Family History  Problem Relation Age of Onset  . Cancer Mother 80       ?carcinoid tumor (had surgical removal)  . Diabetes Mother        borderline  . Stomach cancer Mother   . Heart disease Father        died 69, from MI, had hx of ETOH abuse and liver disease  . Thyroid cancer Sister        (diagnosed at 92) half sister (same dad)   . Diabetes type II Brother   . Kidney disease Neg Hx   . Hypertension Neg Hx   . Hyperlipidemia Neg Hx   . Colon cancer Neg Hx   . Esophageal cancer Neg Hx   . Rectal cancer Neg Hx     Allergies  Allergen Reactions  . Aspirin     wheezing  . Latex   . Penicillins Hives  . Shellfish Allergy     Current Outpatient Medications on File Prior to Visit  Medication Sig Dispense Refill  . albuterol (PROVENTIL) (2.5 MG/3ML) 0.083% nebulizer solution Use 1 vial in nebulizer as needed dx:496 150 mL PRN  . albuterol (VENTOLIN HFA) 108 (90  Base) MCG/ACT inhaler Inhale 2 puffs into the lungs every 6 (six) hours as needed for wheezing or shortness of breath. 1 Inhaler 6  . atorvastatin (LIPITOR) 10 MG tablet Take 1 tablet (10 mg total) by mouth daily. 30 tablet 5  . Benralizumab (FASENRA) 30 MG/ML SOSY Inject 1 mL (30 mg total) as directed every 6 (six) weeks. 1 mL 8  . cetirizine (ZYRTEC) 10 MG tablet Take 10 mg by mouth daily.    . Fluticasone-Umeclidin-Vilant (TRELEGY ELLIPTA) 100-62.5-25 MCG/INH AEPB Inhale 1 puff into the lungs daily. Rinse mouth 60 each 12  . ipratropium (ATROVENT) 0.02 % nebulizer solution Use 1 vial in nebulizer as needed dx. 496 150 mL PRN  . predniSONE (DELTASONE) 10 MG tablet 1.5 tabs daily or lowest dose needed 200 tablet 0  . Prenatal Vit-Fe Sulfate-FA (PRENATAL VITAMIN PO) Take 1 capsule by mouth daily.    [name] spironolactone (ALDACTONE) 25 MG tablet Take 1 tablet (25 mg total) by mouth daily. 30 tablet 5   No current facility-administered medications on file prior to visit.    BP 131/74 (BP Location: Right Arm, Patient Position: Sitting, Cuff Size: Small)   Pulse 94   Temp 97.8 F (36.6 C) (Temporal)   Resp 16   Ht 5\' 7"  (1.702 m)   Wt 186 lb (84.4 kg)   LMP 06/22/2018   SpO2 99%   BMI 29.13 kg/m       Objective:   Physical Exam Constitutional:      Appearance: She is well-developed.  Pulmonary:     Effort: Pulmonary effort is normal.  Musculoskeletal:     Comments: Full range of motion of left knee, no swelling  Skin:         Comments: ecchymosis noted left shin  Psychiatric:        Behavior: Behavior normal.        Thought Content: Thought content normal.        Judgment: Judgment normal.           Assessment & Plan:  S/p Mechanical fall- new. No obvious injury other than bruising. Reassurance provided.   HTN- bp stable/improved on the aldactone- will obtain follow up bmet to assess K+.  This visit occurred during the SARS-CoV-2 public health emergency.  Safety  protocols were  in place, including screening questions prior to the visit, additional usage of staff PPE, and extensive cleaning of exam room while observing appropriate contact time as indicated for disinfecting solutions.

## 2020-05-13 ENCOUNTER — Encounter: Payer: Self-pay | Admitting: Family

## 2020-05-14 NOTE — Progress Notes (Signed)
Mailed out to patient 

## 2020-05-20 ENCOUNTER — Telehealth: Payer: Self-pay | Admitting: Family

## 2020-05-20 NOTE — Telephone Encounter (Signed)
Patient had questions about her mammogram results from 04-18-20. Patient advised per report no malignancy was identified.

## 2020-05-20 NOTE — Telephone Encounter (Signed)
Caller: Rabiah   Call Back # [phone]  Pt requesting a call back in regards to lab results.

## 2020-05-26 ENCOUNTER — Telehealth: Payer: Self-pay | Admitting: Internal Medicine

## 2020-05-26 NOTE — Telephone Encounter (Signed)
Berna Bue Order: 30mg  #1 prefilled syringe Ordered date: 05/26/2020 Expected date of arrival: 05/28/2020 Ordered by: Desmond Dike, [place]  Specialty Pharmacy: [facility]

## 2020-05-28 ENCOUNTER — Telehealth: Payer: Self-pay | Admitting: Internal Medicine

## 2020-05-28 NOTE — Telephone Encounter (Signed)
Fasenra Shipment Received:  30mg  #1 prefilled syringe Medication arrival date: 05/28/20 Lot #: [id number] Exp date: 09/18/21 Received by: Elliot Dally

## 2020-05-30 NOTE — Telephone Encounter (Signed)
Medication has been received but was not logged when it arrived.

## 2020-06-04 ENCOUNTER — Ambulatory Visit: Payer: BC Managed Care – PPO

## 2020-06-18 ENCOUNTER — Telehealth: Payer: Self-pay | Admitting: Internal Medicine

## 2020-06-18 ENCOUNTER — Ambulatory Visit: Payer: BC Managed Care – PPO

## 2020-06-19 NOTE — Telephone Encounter (Signed)
Pt calling back to reschedule injection for 7/2 at 4:30.  Please advise.  [phone]

## 2020-06-19 NOTE — Telephone Encounter (Signed)
ATC pt, there was no answer and her VM was full. Will try back. 

## 2020-06-20 NOTE — Telephone Encounter (Signed)
Called pt, appt made on 7/7 at 4:30

## 2020-06-25 ENCOUNTER — Ambulatory Visit: Payer: BC Managed Care – PPO

## 2020-07-08 ENCOUNTER — Telehealth: Payer: Self-pay | Admitting: Internal Medicine

## 2020-07-09 NOTE — Telephone Encounter (Signed)
Called and spoke with Patient. Patient scheduled for Fasenra injection 07/16/20, at 1630.

## 2020-07-09 NOTE — Telephone Encounter (Signed)
Patient would like injection 07/09/2020 afternoon. Needs before 4:00 pm. Patient phone number is [phone]

## 2020-07-16 ENCOUNTER — Ambulatory Visit: Payer: BC Managed Care – PPO

## 2020-07-16 ENCOUNTER — Telehealth: Payer: Self-pay | Admitting: Internal Medicine

## 2020-07-17 NOTE — Telephone Encounter (Signed)
Spoke with pt. She can't schedule anything at this time due to her work schedule. Pt will call when she knows for sure that she will be able to keep her appointment. Nothing further was needed at this time.

## 2020-07-24 ENCOUNTER — Telehealth: Payer: Self-pay | Admitting: Internal Medicine

## 2020-07-24 NOTE — Telephone Encounter (Signed)
Spoke with pt. She has been scheduled for 07/25/2020 at 1600. Nothing further was needed.

## 2020-07-25 ENCOUNTER — Ambulatory Visit: Payer: BC Managed Care – PPO

## 2020-08-15 ENCOUNTER — Telehealth: Payer: Self-pay | Admitting: Internal Medicine

## 2020-08-15 ENCOUNTER — Ambulatory Visit: Payer: BC Managed Care – PPO | Admitting: Family

## 2020-08-15 NOTE — Telephone Encounter (Signed)
Called and spoke with pt. Pt is needing her prednisone refilled sent to [place] off of [address]. Dr. Annamaria Boots, please advise  Allergies  Allergen Reactions  . Aspirin     wheezing  . Latex   . Penicillins Hives  . Shellfish Allergy      Current Outpatient Medications:  .  albuterol (PROVENTIL) (2.5 MG/3ML) 0.083% nebulizer solution, Use 1 vial in nebulizer as needed dx:496, Disp: 150 mL, Rfl: PRN .  albuterol (VENTOLIN HFA) 108 (90 Base) MCG/ACT inhaler, Inhale 2 puffs into the lungs every 6 (six) hours as needed for wheezing or shortness of breath., Disp: 1 Inhaler, Rfl: 6 .  atorvastatin (LIPITOR) 10 MG tablet, Take 1 tablet (10 mg total) by mouth daily., Disp: 30 tablet, Rfl: 5 .  Benralizumab (FASENRA) 30 MG/ML SOSY, Inject 1 mL (30 mg total) as directed every 6 (six) weeks., Disp: 1 mL, Rfl: 8 .  cetirizine (ZYRTEC) 10 MG tablet, Take 10 mg by mouth daily., Disp: , Rfl:  .  Fluticasone-Umeclidin-Vilant (TRELEGY ELLIPTA) 100-62.5-25 MCG/INH AEPB, Inhale 1 puff into the lungs daily. Rinse mouth, Disp: 60 each, Rfl: 12 .  ipratropium (ATROVENT) 0.02 % nebulizer solution, Use 1 vial in nebulizer as needed dx. 496, Disp: 150 mL, Rfl: PRN .  predniSONE (DELTASONE) 10 MG tablet, 1.5 tabs daily or lowest dose needed, Disp: 200 tablet, Rfl: 0 .  Prenatal Vit-Fe Sulfate-FA (PRENATAL VITAMIN PO), Take 1 capsule by mouth daily., Disp: , Rfl:  .  spironolactone (ALDACTONE) 25 MG tablet, Take 1 tablet (25 mg total) by mouth daily., Disp: 30 tablet, Rfl: 5

## 2020-08-16 ENCOUNTER — Other Ambulatory Visit: Payer: Self-pay | Admitting: Internal Medicine

## 2020-08-16 MED ORDER — PREDNISONE 10 MG PO TABS: ORAL_TABLET | ORAL | 0 refills | Status: DC

## 2020-08-16 NOTE — Telephone Encounter (Signed)
Prednisone refill e-sent 

## 2020-08-18 ENCOUNTER — Other Ambulatory Visit: Payer: Self-pay | Admitting: Internal Medicine

## 2020-08-18 MED ORDER — ALBUTEROL SULFATE HFA 108 (90 BASE) MCG/ACT IN AERS: 2.0000 | INHALATION_SPRAY | Freq: Four times a day (QID) | RESPIRATORY_TRACT | 6 refills | Status: DC | PRN

## 2020-08-18 NOTE — Telephone Encounter (Signed)
Called and spoke with pt letting her know that [facility] refilled her prednisone and she verbalized understanding. Pt also stated that she was needing her albuterol inhaler refilled. This has also been sent to pharmacy for pt. Nothing further needed.

## 2020-08-19 ENCOUNTER — Telehealth: Payer: Self-pay | Admitting: Internal Medicine

## 2020-08-19 ENCOUNTER — Ambulatory Visit: Payer: BC Managed Care – PPO

## 2020-08-19 NOTE — Telephone Encounter (Signed)
Called and spoke with Patient.  Patient offered several appointment times.  Patient scheduled 1645, today, and requested 1600 injection appointment 08/20/20, in case she does not make it to appointment today. Patient scheduled for both times.  Will cancel today's if she does not show, and will cancel 08/20/20, if she does show for appointment today.

## 2020-08-19 NOTE — Telephone Encounter (Signed)
ATC Patient to reschedule injection for 08/20/20.  No answer, and VM is full. Will call back later today.

## 2020-08-20 ENCOUNTER — Telehealth: Payer: Self-pay | Admitting: Internal Medicine

## 2020-08-20 ENCOUNTER — Ambulatory Visit: Payer: BC Managed Care – PPO

## 2020-08-20 NOTE — Telephone Encounter (Signed)
ATC patient.  No answer, unable to leave VM, because her mailbox is full. I have available 0900 and 1030 openings for 08/21/20.

## 2020-08-21 ENCOUNTER — Ambulatory Visit (INDEPENDENT_AMBULATORY_CARE_PROVIDER_SITE_OTHER): Payer: BC Managed Care – PPO

## 2020-08-21 ENCOUNTER — Other Ambulatory Visit: Payer: Self-pay

## 2020-08-21 DIAGNOSIS — J4551 Severe persistent asthma with (acute) exacerbation: Secondary | ICD-10-CM | POA: Diagnosis not present

## 2020-08-21 MED ORDER — BENRALIZUMAB 30 MG/ML ~~LOC~~ SOSY
30.0000 mg | PREFILLED_SYRINGE | Freq: Once | SUBCUTANEOUS | Status: AC
  Administered 2020-08-21: 30 mg via SUBCUTANEOUS

## 2020-08-21 MED ORDER — ALBUTEROL SULFATE HFA 108 (90 BASE) MCG/ACT IN AERS: 2.0000 | INHALATION_SPRAY | Freq: Four times a day (QID) | RESPIRATORY_TRACT | 6 refills | Status: DC | PRN

## 2020-08-21 NOTE — Telephone Encounter (Signed)
Called and spoke with Patient.  Patient requested injection appointment.  Patient is to arrive today for fasenra injection, between 2508-7199.

## 2020-08-21 NOTE — Progress Notes (Signed)
Have you been hospitalized within the last 10 days?  No Do you have a fever?  No Do you have a cough?  No Do you have a headache or sore throat? No Do you have your Epi Pen visible and is it within date?  Yes    Patient requested a refill for Albuterol, 8oz be sent to [facility]. Prescription sent. Nothing further at this time.

## 2020-08-28 ENCOUNTER — Telehealth: Payer: Self-pay | Admitting: Internal Medicine

## 2020-08-28 NOTE — Telephone Encounter (Signed)
Spoke with the pt  She is c/o "feeling a little stopped up" She has yellow nasal congestion x 2 days  No sore throat, f/c/s, cough, SOB, wheezing  She has had her covid vaccine  She has tried otc nasal spray and it did not help  Wants abx  Dr Annamaria Boots left already forwarding to APP of day

## 2020-08-28 NOTE — Telephone Encounter (Signed)
Spoke with patient regarding prior message . Advised patient per Tammy Recommend Covid testing   Can add Saline nasal rinses  Add Flonase 2 puffs daily   Cont aggressive maintenance regimen Trelegy, Zyrtec, Fasenra, , albuterol As needed    Hold on ABX for now with only 2 days of sx  Can send to Dr. Annamaria Boots  For review in am to consider his recs   Please contact office for sooner follow up if symptoms do not improve or worsen or seek emergency care   Patient stated is unsure if she want's to get COVID testing . Patient is leaving tomorrow evening for vacation .

## 2020-08-28 NOTE — Telephone Encounter (Signed)
Recommend Covid testing   Can add Saline nasal rinses  Add Flonase 2 puffs daily   Cont aggressive maintenance regimen Trelegy, Zyrtec, Fasenra, , albuterol As needed    Hold on ABX for now with only 2 days of sx  Can send to Dr. Annamaria Boots  For review in am to consider his recs   Please contact office for sooner follow up if symptoms do not improve or worsen or seek emergency care

## 2020-08-29 MED ORDER — AZITHROMYCIN 250 MG PO TABS: ORAL_TABLET | ORAL | 0 refills | Status: DC

## 2020-08-29 NOTE — Telephone Encounter (Signed)
Ok to send her a Zpak to hold and carry with her on vacation in case needed 250 mg, # 6, 2 today then one daily

## 2020-08-29 NOTE — Telephone Encounter (Signed)
Called and spoke with pt letting her know that [facility] said it was okay for [place] to send Zpak abx to pharmacy for her to have in case she needed it while on vacation and she verbalized understanding. verified preferred pharmacy and sent Rx in for pt. Nothing further needed.

## 2020-08-29 NOTE — Telephone Encounter (Signed)
Dr. Annamaria Boots, Please see notes and advise.  Thanks.

## 2020-09-11 ENCOUNTER — Ambulatory Visit: Payer: BC Managed Care – PPO | Admitting: Internal Medicine

## 2020-09-11 NOTE — Progress Notes (Deleted)
Patient ID: [name], female    DOB: 1966/11/05, 54 y.o.   MRN: [id number] F followed for moderate persistent asthma with COPD (requiring frequent steroids , Failed Xolair, , hx nasal polyps, allergic rhinitis, hx DVT PFT- 09/09/11- FEV1 1.64/ 54%, FEV1/FVC 0.54, FEF25-75% 0.74/ 22%, insignif response to bronchodilator TLC 0.74, DLCO 72%. Moderate obstructive disease, mild restriction, mild reduction of DLCO. Office spirometry- mild obstructive airways disease. FEV1 1.95/73%, FVC 3.01/93%, FEV1/FVC 0.65, FEF 25-75% 1.26/38%.   ----------------------------------------------------------------- .  03/10/20- 54 year old female never smoker followed for moderate persistent asthma with COPD (frequent steroids, failed Xolair), history nasal polyps, allergic rhinitis, history DVT Albuterol HFA, neb albuterol/ atrovent, EpiPen, Trelegy,  prednisone 10 mg, Fenton Foy renewal PA approved in Feb. -----f/u Severe persistent asthma. Patient stated some wheezing today.  Taking prednisone 2o qod. Berna Bue helps but doesn't keep her from needing some prednisone.  Has had 1 Covx.  Mild increase in wheezing blamed on Spring weather- doesn't feel sick.  09/11/20- 54 year old female never smoker followed for moderate persistent asthma with COPD (frequent steroids, failed Xolair), history nasal polyps, allergic rhinitis, history DVT Albuterol HFA, neb albuterol/ atrovent, EpiPen, Trelegy,  prednisone 10 mg, Fasenra Covid vax- Flu vax-  CXR 03/10/20- IMPRESSION: No active cardiopulmonary disease.  Review of Systems-See HPI     + = positive Constitutional:   No weight loss, night sweats,  Fevers, chills, fatigue, lassitude. HEENT:   No headaches,  Difficulty swallowing,  Tooth/dental problems,  Sore throat,                No sneezing, itching, ear ache,, + post nasal drip. + nasal congestion. CV:  No chest pain,  Orthopnea, PND, swelling in lower extremities, anasarca, dizziness, palpitations GI   No heartburn, indigestion, abdominal pain, nausea, vomiting, Resp:Usually some shortness of breath with exertion, not at rest.  No excess mucus, + productive cough,          + non-productive cough,  No coughing up of blood.   change in color of mucus.  + wheezing.   Skin: no rash or lesions. GU: . MS:  No joint pain, + swelling.   Neuro- nothing unusual Psych:  No change in mood or affect. No depression or anxiety.  No memory loss.  Objective:   Physical Exam General- Alert, Oriented, Affect-appropriate, Distress- none acute  Relaxed and conversational   + overweight, + pink cheeks/ steroid facies Skin- rash-none, lesions- none, excoriation- none Lymphadenopathy- none Head- atraumatic            Eyes- Gross vision intact, PERRLA, conjunctivae clear secretions            Ears- Hearing, canals normal            Nose- +Turbinate edema, No-Septal dev, mucus, , erosion, perforation. No definite polyps             Throat- Mallampati III-IV, mucosa -not red , drainage- none, tonsils- atrophic Neck- flexible , trachea midline, no stridor , thyroid nl, carotid no bruit Chest - symmetrical excursion , unlabored           Heart/CV- RRR , no murmur , no gallop  , no rub, nl s1 s2                           - JVD- none , edema 1+ , stasis changes- none, varices- none           Lung-  wheeze+  mild I&E,  unlabored, cough- none , dullness-none, rub- none           Chest wall-  Abd-  Br/ Gen/ Rectal- Not done, not indicated Extrem- cyanosis- none, clubbing, none, atrophy- none, strength- nl.  Neuro- grossly intact to observation

## 2020-10-06 ENCOUNTER — Telehealth: Payer: Self-pay | Admitting: Internal Medicine

## 2020-10-06 NOTE — Telephone Encounter (Signed)
[name] Order: 30mg  #1 prefilled syringe Ordered date: 10/06/20 Expected date of arrival: 10/08/20 Ordered by: [place]: Claria Dice

## 2020-10-08 NOTE — Telephone Encounter (Signed)
Fasenra Shipment Received:  30mg  #1 prefilled syringe Medication arrival date: 10/08/20 Lot #: [id number] Exp date: 11/18/2021 Received by: Elliot Dally

## 2020-10-16 ENCOUNTER — Ambulatory Visit: Payer: BC Managed Care – PPO

## 2020-11-04 ENCOUNTER — Telehealth: Payer: Self-pay | Admitting: Internal Medicine

## 2020-11-05 NOTE — Progress Notes (Signed)
Patient ID: [name], female    DOB: 1966-03-16, 54 y.o.   MRN: [id number] F followed for moderate persistent asthma with COPD (requiring frequent steroids , Failed Xolair, , hx nasal polyps, allergic rhinitis, hx DVT PFT- 09/09/11- FEV1 1.64/ 54%, FEV1/FVC 0.54, FEF25-75% 0.74/ 22%, insignif response to bronchodilator TLC 0.74, DLCO 72%. Moderate obstructive disease, mild restriction, mild reduction of DLCO. Office spirometry- mild obstructive airways disease. FEV1 1.95/73%, FVC 3.01/93%, FEV1/FVC 0.65, FEF 25-75% 1.26/38%.   -----------------------------------------------------------------  03/10/20- 54 year old female never smoker followed for moderate persistent asthma with COPD (frequent steroids, failed Xolair), history nasal polyps, allergic rhinitis, history DVT Albuterol HFA, neb albuterol/ atrovent, EpiPen, Trelegy,  prednisone 10 mg, Fenton Foy renewal PA approved in Feb. -----f/u Severe persistent asthma. Patient stated some wheezing today.  Taking prednisone 2o qod. Berna Bue helps but doesn't keep her from needing some prednisone.  Has had 1 Covx.  Mild increase in wheezing blamed on Spring weather- doesn't feel sick.  11/06/20- 54 year old female never smoker followed for moderate persistent asthma with COPD (frequent steroids, failed Xolair), history nasal polyps, allergic rhinitis, history DVT Albuterol HFA, neb albuterol/ atrovent, EpiPen, Trelegy,  prednisone 10 mg, Fasenra, Zyrtec,  Covid vax- 3 Moderna Flu vax- had pt states allergies are bothering her runny nose Some post-nasal drip with little sneeze. Asthma well-controlled on Fasenra- does help. No prednisone now in 2-3 months.   Review of Systems-See HPI     + = positive Constitutional:   No weight loss, night sweats,  Fevers, chills, fatigue, lassitude. HEENT:   No headaches,  Difficulty swallowing,  Tooth/dental problems,  Sore throat,                No sneezing, itching, ear ache,, + post nasal drip.  + nasal congestion. CV:  No chest pain,  Orthopnea, PND, swelling in lower extremities, anasarca, dizziness, palpitations GI  No heartburn, indigestion, abdominal pain, nausea, vomiting, Resp:Usually some shortness of breath with exertion, not at rest.  No excess mucus,  productive cough,          + non-productive cough,  No coughing up of blood.   change in color of mucus.   wheezing.   Skin: no rash or lesions. GU: . MS:  No joint pain, + swelling.   Neuro- nothing unusual Psych:  No change in mood or affect. No depression or anxiety.  No memory loss.  Objective:   Physical Exam General- Alert, Oriented, Affect-appropriate, Distress- none acute  Relaxed and conversational   + overweight, + pink cheeks/ steroid facies Skin- rash-none, lesions- none, excoriation- none Lymphadenopathy- none Head- atraumatic            Eyes- Gross vision intact, PERRLA, conjunctivae clear secretions            Ears- Hearing, canals normal            Nose- +Turbinate edema, No-Septal dev, mucus, , erosion, perforation. No definite polyps             Throat- Mallampati III-IV, mucosa -not red , drainage- none, tonsils- atrophic Neck- flexible , trachea midline, no stridor , thyroid nl, carotid no bruit Chest - symmetrical excursion , unlabored           Heart/CV- RRR , no murmur , no gallop  , no rub, nl s1 s2                           - JVD-  none , edema- none , stasis changes- none, varices- none           Lung-  Clear, wheeze- none,  unlabored, cough- none , dullness-none, rub- none           Chest wall-  Abd-  Br/ Gen/ Rectal- Not done, not indicated Extrem- cyanosis- none, clubbing, none, atrophy- none, strength- nl.  Neuro- grossly intact to observation

## 2020-11-05 NOTE — Telephone Encounter (Signed)
Called and spoke with Patient.  Patient has OV with Dr. Annamaria Boots at [facility] 11/06/20.  Patient can receive Fasenra injection during OV.

## 2020-11-06 ENCOUNTER — Ambulatory Visit (INDEPENDENT_AMBULATORY_CARE_PROVIDER_SITE_OTHER): Payer: BC Managed Care – PPO | Admitting: Internal Medicine

## 2020-11-06 ENCOUNTER — Encounter: Payer: Self-pay | Admitting: Internal Medicine

## 2020-11-06 ENCOUNTER — Other Ambulatory Visit: Payer: Self-pay

## 2020-11-06 ENCOUNTER — Ambulatory Visit: Payer: BC Managed Care – PPO

## 2020-11-06 VITALS — BP 124/76 | HR 83 | Temp 98.3°F | Ht 67.0 in | Wt 190.6 lb

## 2020-11-06 DIAGNOSIS — J4551 Severe persistent asthma with (acute) exacerbation: Secondary | ICD-10-CM | POA: Diagnosis not present

## 2020-11-06 DIAGNOSIS — J3089 Other allergic rhinitis: Secondary | ICD-10-CM | POA: Diagnosis not present

## 2020-11-06 DIAGNOSIS — J4489 Other specified chronic obstructive pulmonary disease: Secondary | ICD-10-CM

## 2020-11-06 DIAGNOSIS — J302 Other seasonal allergic rhinitis: Secondary | ICD-10-CM | POA: Diagnosis not present

## 2020-11-06 DIAGNOSIS — J449 Chronic obstructive pulmonary disease, unspecified: Secondary | ICD-10-CM | POA: Diagnosis not present

## 2020-11-06 LAB — HM DIABETES EYE EXAM

## 2020-11-06 MED ORDER — PREDNISONE 10 MG PO TABS: ORAL_TABLET | ORAL | 0 refills | Status: DC

## 2020-11-06 MED ORDER — ALBUTEROL SULFATE (2.5 MG/3ML) 0.083% IN NEBU
INHALATION_SOLUTION | RESPIRATORY_TRACT | 99 refills | Status: DC
Start: 2020-11-06 — End: 2020-12-03

## 2020-11-06 MED ORDER — BENRALIZUMAB 30 MG/ML ~~LOC~~ SOSY
30.0000 mg | PREFILLED_SYRINGE | Freq: Once | SUBCUTANEOUS | Status: AC
  Administered 2020-11-06: 30 mg via SUBCUTANEOUS

## 2020-11-06 NOTE — Patient Instructions (Signed)
Refills sent for prednisone and nebulizer solution  I suggest you try otc chlortrimeton antihistmine. This is better at drying than the newer ones and if it makes you a little drowsy at bedtime that's ok.  We can continue Berna Bue- glad it helps  Please call if you have problems.

## 2020-11-07 ENCOUNTER — Ambulatory Visit (INDEPENDENT_AMBULATORY_CARE_PROVIDER_SITE_OTHER): Payer: BC Managed Care – PPO | Admitting: Family

## 2020-11-07 ENCOUNTER — Encounter: Payer: Self-pay | Admitting: Family

## 2020-11-07 VITALS — BP 129/73 | HR 70 | Temp 98.2°F | Resp 16 | Ht 67.0 in | Wt 188.0 lb

## 2020-11-07 DIAGNOSIS — J4541 Moderate persistent asthma with (acute) exacerbation: Secondary | ICD-10-CM

## 2020-11-07 DIAGNOSIS — E785 Hyperlipidemia, unspecified: Secondary | ICD-10-CM

## 2020-11-07 DIAGNOSIS — E119 Type 2 diabetes mellitus without complications: Secondary | ICD-10-CM | POA: Diagnosis not present

## 2020-11-07 DIAGNOSIS — I10 Essential (primary) hypertension: Secondary | ICD-10-CM | POA: Diagnosis not present

## 2020-11-07 DIAGNOSIS — R809 Proteinuria, unspecified: Secondary | ICD-10-CM

## 2020-11-07 MED ORDER — LISINOPRIL 2.5 MG PO TABS: 2.5000 mg | ORAL_TABLET | Freq: Every day | ORAL | 1 refills | Status: DC

## 2020-11-07 MED ORDER — ATORVASTATIN CALCIUM 10 MG PO TABS: 10.0000 mg | ORAL_TABLET | Freq: Every day | ORAL | 1 refills | Status: DC

## 2020-11-07 NOTE — Progress Notes (Signed)
Subjective:    Patient ID: [name], female    DOB: 1966-05-21, 54 y.o.   MRN: [id number]  HPI  Patient is a 54 yr old female who presents today for follow up. She is working in [employer] for [employer].  She is working 78 hrs a week.  Not sleeping/eating right. She is moving to [place] which is going to be less stressful.    HTN- not taking aldactone.  BP Readings from Last 3 Encounters:  11/07/20 129/73  11/06/20 124/76  05/12/20 131/74   Wt Readings from Last 3 Encounters:  11/07/20 188 lb (85.3 kg)  11/06/20 190 lb 9.6 oz (86.5 kg)  05/12/20 186 lb (84.4 kg)   DM2- Diet controlled.    Lab Results  Component Value Date   HGBA1C 6.5 04/07/2020   HGBA1C 6.8 (H) 01/02/2020   HGBA1C 6.5 (H) 08/17/2019   Lab Results  Component Value Date   MICROALBUR 3.1 (H) 01/02/2020   LDLCALC 113 (H) 04/07/2020   CREATININE 0.72 05/12/2020   Asthma- maintained on Fasenra, Trelegy, albuterol and  Prednisone prn. These medications are being managed by Pulmonology.    Hyperlipidemia- maintained on lipitor 10mg .  Lab Results  Component Value Date   CHOL 190 04/07/2020   HDL 53.70 04/07/2020   LDLCALC 113 (H) 04/07/2020   TRIG 116.0 04/07/2020   CHOLHDL 4 04/07/2020     Review of Systems See HPI  Past Medical History:  Diagnosis Date   Allergic rhinitis, cause unspecified    Asthma    Diabetes type 2, controlled ([place])    History of DVT (deep vein thrombosis) 2005   Pt reports blood clot below knee ?unsure which leg.   Nasal polyps    Unspecified essential hypertension      Social History   Socioeconomic History   Marital status: Married    Spouse name: Not on file   Number of children: Not on file   Years of education: Not on file   Highest education level: Not on file  Occupational History   Occupation: Freight forwarder [employer]  Tobacco Use   Smoking status: Never Smoker   Smokeless tobacco: Never Used   Tobacco comment: Never Used Tobacco  Vaping Use     Vaping Use: Never used  Substance and Sexual Activity   Alcohol use: Yes    Alcohol/week: 0.0 standard drinks    Comment: 1 glass of wine per year   Drug use: No   Sexual activity: Yes    Birth control/protection: None  Other Topics Concern   Not on file  Social History Narrative   Married   No children   No pets   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     Social Determinants of Health   Financial Resource Strain:    Difficulty of Paying Living Expenses: Not on file  Food Insecurity:    Worried About Charity fundraiser in the Last Year: Not on file   [employer] of Food in the Last Year: Not on file  Transportation Needs:    Lack of Transportation (Medical): Not on file   Lack of Transportation (Non-Medical): Not on file  Physical Activity:    Days of Exercise per Week: Not on file   Minutes of Exercise per Session: Not on file  Stress:    Feeling of Stress : Not on file  Social  Connections:    Frequency of Communication with Friends and Family: Not on file   Frequency of Social Gatherings with Friends and Family: Not on file   Attends Religious Services: Not on file   Active Member of Clubs or Organizations: Not on file   Attends Archivist Meetings: Not on file   Marital Status: Not on file  Intimate Partner Violence:    Fear of Current or Ex-Partner: Not on file   Emotionally Abused: Not on file   Physically Abused: Not on file   Sexually Abused: Not on file    Past Surgical History:  Procedure Laterality Date   LASIK Bilateral 2008   NASAL SINUS SURGERY  1610   Dr Erik Obey   OVARIAN CYST REMOVAL  02/2016   pt reported-- Dr Philis Pique   UTERINE FIBROID SURGERY  2005    Family History  Problem Relation Age of Onset   Cancer Mother 48       ?carcinoid tumor (had surgical removal)   Diabetes Mother        borderline   Stomach cancer  Mother    Heart disease Father        died 43, from MI, had hx of ETOH abuse and liver disease   Thyroid cancer Sister        (diagnosed at 34) half sister (same dad)    Diabetes type II Brother    Kidney disease Neg Hx    Hypertension Neg Hx    Hyperlipidemia Neg Hx    Colon cancer Neg Hx    Esophageal cancer Neg Hx    Rectal cancer Neg Hx     Allergies  Allergen Reactions   Aspirin     wheezing   Latex    Penicillins Hives   Shellfish Allergy     Current Outpatient Medications on File Prior to Visit  Medication Sig Dispense Refill   albuterol (PROVENTIL) (2.5 MG/3ML) 0.083% nebulizer solution Use 1 vial in nebulizer as needed dx:496 150 mL PRN   albuterol (VENTOLIN HFA) 108 (90 Base) MCG/ACT inhaler Inhale 2 puffs into the lungs every 6 (six) hours as needed for wheezing or shortness of breath. 8 g 6   atorvastatin (LIPITOR) 10 MG tablet Take 1 tablet (10 mg total) by mouth daily. 30 tablet 5   Benralizumab (FASENRA) 30 MG/ML SOSY Inject 1 mL (30 mg total) as directed every 6 (six) weeks. 1 mL 8   cetirizine (ZYRTEC) 10 MG tablet Take 10 mg by mouth daily.     Fluticasone-Umeclidin-Vilant (TRELEGY ELLIPTA) 100-62.5-25 MCG/INH AEPB Inhale 1 puff into the lungs daily. Rinse mouth 60 each 12   predniSONE (DELTASONE) 10 MG tablet 1.5 tabs daily or lowest dose needed 200 tablet 0   Prenatal Vit-Fe Sulfate-FA (PRENATAL VITAMIN PO) Take 1 capsule by mouth daily.     spironolactone (ALDACTONE) 25 MG tablet Take 1 tablet (25 mg total) by mouth daily. 30 tablet 5   No current facility-administered medications on file prior to visit.    BP 129/73 (BP Location: Right Arm, Patient Position: Sitting, Cuff Size: Large)    Pulse 70    Temp 98.2 F (36.8 C) (Temporal)    Resp 16    Ht 5\' 7"  (1.702 m)    Wt 188 lb (85.3 kg)    LMP 06/22/2018    SpO2 100%    BMI 29.44 kg/m       Objective:   Physical Exam Constitutional:      Appearance:  She is well-developed.    Cardiovascular:     Rate and Rhythm: Normal rate and regular rhythm.     Heart sounds: Normal heart sounds. No murmur heard.   Pulmonary:     Effort: Pulmonary effort is normal. No respiratory distress.     Breath sounds: Normal breath sounds. No wheezing.  Psychiatric:        Behavior: Behavior normal.        Thought Content: Thought content normal.        Judgment: Judgment normal.           Assessment & Plan:  HTN- bp is stable without aldactone. Advised pt ok to remain off of aldactone.  Microalbuminuria- will rx with lisinopril 2.5mg  once daily.  DM2- diet controlled. Check a follow up A1C.  Hyperlipidemia- lipids stable. Continue atorvastatin 10mg  once daily.   Asthma- stable- management per pulmonology.   This visit occurred during the SARS-CoV-2 public health emergency.  Safety protocols were in place, including screening questions prior to the visit, additional usage of staff PPE, and extensive cleaning of exam room while observing appropriate contact time as indicated for disinfecting solutions.

## 2020-11-07 NOTE — Patient Instructions (Signed)
Please complete lab work prior to leaving.   

## 2020-11-08 LAB — HEMOGLOBIN A1C
Hgb A1c MFr Bld: 6.3 % of total Hgb — ABNORMAL HIGH (ref <5.7)
Mean Plasma Glucose: 134 (calc)
eAG (mmol/L): 7.4 (calc)

## 2020-11-08 LAB — BASIC METABOLIC PANEL
BUN: 14 mg/dL (ref 7–25)
CO2: 30 mmol/L (ref 20–32)
Calcium: 9.3 mg/dL (ref 8.6–10.4)
Chloride: 104 mmol/L (ref 98–110)
Creat: 0.82 mg/dL (ref 0.50–1.05)
Glucose, Bld: 69 mg/dL (ref 65–99)
Potassium: 3.7 mmol/L (ref 3.5–5.3)
Sodium: 140 mmol/L (ref 135–146)

## 2020-11-10 ENCOUNTER — Encounter: Payer: Self-pay | Admitting: Family

## 2020-11-11 NOTE — Progress Notes (Signed)
Mailed out to pt 

## 2020-11-21 ENCOUNTER — Other Ambulatory Visit: Payer: BC Managed Care – PPO

## 2020-11-28 ENCOUNTER — Telehealth: Payer: Self-pay | Admitting: *Deleted

## 2020-11-28 ENCOUNTER — Other Ambulatory Visit (INDEPENDENT_AMBULATORY_CARE_PROVIDER_SITE_OTHER): Payer: BC Managed Care – PPO

## 2020-11-28 ENCOUNTER — Other Ambulatory Visit: Payer: Self-pay

## 2020-11-28 ENCOUNTER — Encounter: Payer: Self-pay | Admitting: Family

## 2020-11-28 DIAGNOSIS — R809 Proteinuria, unspecified: Secondary | ICD-10-CM | POA: Diagnosis not present

## 2020-11-28 LAB — BASIC METABOLIC PANEL
BUN: 13 mg/dL (ref 6–23)
CO2: 30 mEq/L (ref 19–32)
Calcium: 9 mg/dL (ref 8.4–10.5)
Chloride: 105 mEq/L (ref 96–112)
Creatinine, Ser: 0.84 mg/dL (ref 0.40–1.20)
GFR: 79.54 mL/min (ref >60.00)
Glucose, Bld: 63 mg/dL — ABNORMAL LOW (ref 70–99)
Potassium: 4 mEq/L (ref 3.5–5.1)
Sodium: 143 mEq/L (ref 135–145)

## 2020-11-28 NOTE — Telephone Encounter (Signed)
Patient Name [name] Patient DOB 27-Feb-1966 Call Type Message Only Information Provided Reason for Call Request to Schedule Office Appointment Initial Comment Caller states she was was needing to schedule lab work

## 2020-11-28 NOTE — Telephone Encounter (Signed)
Patient scheduled for today

## 2020-12-01 NOTE — Progress Notes (Signed)
Mailed out to patient 

## 2020-12-02 ENCOUNTER — Other Ambulatory Visit: Payer: Self-pay | Admitting: Internal Medicine

## 2020-12-06 ENCOUNTER — Encounter: Payer: Self-pay | Admitting: Internal Medicine

## 2020-12-06 NOTE — Assessment & Plan Note (Signed)
I don't see obvious polyps anteriorly now. Discussed symptomatic options. Plan- try chlortrimeton and nasalcrom

## 2020-12-06 NOTE — Assessment & Plan Note (Signed)
Chronic asthma. [name] has reduced need for prednisone, but we will let her keep some prednisone on hand as discussed, with holidays coming.

## 2020-12-24 ENCOUNTER — Telehealth: Payer: Self-pay | Admitting: Internal Medicine

## 2020-12-24 DIAGNOSIS — J4541 Moderate persistent asthma with (acute) exacerbation: Secondary | ICD-10-CM

## 2020-12-24 MED ORDER — FASENRA 30 MG/ML ~~LOC~~ SOSY: 30.0000 mg | PREFILLED_SYRINGE | SUBCUTANEOUS | 8 refills | Status: DC

## 2020-12-24 NOTE — Telephone Encounter (Signed)
Called [facility] pharmacy regarding Fasenra rejection. Patient must fill through [id number] located in [place], [place]).  New prescription sent. Routing back to [employer], LPN.  Phone: [phone]

## 2020-12-24 NOTE — Telephone Encounter (Signed)
Called [facility] to schedule office delivery of Fasenra. I was told Harrington Challenger is being rejected through insurance, prescription exceeded limits. Patient is scheduled next injection 01/01/21.  Message routed to Pharmacy Team to assist.

## 2020-12-26 MED ORDER — FASENRA 30 MG/ML ~~LOC~~ SOSY: 30.0000 mg | PREFILLED_SYRINGE | SUBCUTANEOUS | 8 refills | Status: DC

## 2020-12-26 NOTE — Telephone Encounter (Signed)
[place].  Fasenra prescription has not received electronically and representative requested paper fax prescription be sent to [phone]. Signed Berna Bue prescription faxed to [place].

## 2020-12-29 NOTE — Telephone Encounter (Signed)
[place] to schedule [place] shipment.  Per Pharmacy representative, Fasenra prescription was transferred to [place], [phone], and [facility] will not be handling [place] shipment. [place] spoke with [place]. Shirleen stated Patient consent is needed for 2022, before Berna Bue can be shipped to office. Shirleen called Patient and 2022 consent received.  Berna Bue Order: 30mg  #1 prefilled syringe Ordered date: 12/29/20 Expected date of arrival: 12/31/20 Ordered by: [place]: Lennette Bihari

## 2020-12-31 NOTE — Telephone Encounter (Signed)
Fasenra Shipment Received:  30mg  #1 prefilled syringe Medication arrival date: 12/31/20 Lot #: [id number] Exp date: 01/19/2022 Received by: Elliot Dally

## 2021-01-01 ENCOUNTER — Ambulatory Visit: Payer: BC Managed Care – PPO

## 2021-01-07 ENCOUNTER — Other Ambulatory Visit: Payer: Self-pay | Admitting: *Deleted

## 2021-01-07 DIAGNOSIS — J4541 Moderate persistent asthma with (acute) exacerbation: Secondary | ICD-10-CM

## 2021-01-07 MED ORDER — FASENRA 30 MG/ML ~~LOC~~ SOSY: 30.0000 mg | PREFILLED_SYRINGE | SUBCUTANEOUS | 5 refills | Status: DC

## 2021-01-22 ENCOUNTER — Ambulatory Visit (AMBULATORY_SURGERY_CENTER): Payer: Self-pay | Admitting: *Deleted

## 2021-01-22 ENCOUNTER — Other Ambulatory Visit: Payer: Self-pay

## 2021-01-22 VITALS — Ht 67.0 in | Wt 203.0 lb

## 2021-01-22 DIAGNOSIS — Z86010 Personal history of colonic polyps: Secondary | ICD-10-CM

## 2021-01-22 MED ORDER — PLENVU 140 G PO SOLR: 1.0000 | ORAL | 0 refills | Status: DC

## 2021-01-22 NOTE — Progress Notes (Signed)
No egg or soy allergy known to patient  No issues with past sedation with any surgeries or procedures No intubation problems in the past  No FH of Malignant Hyperthermia No diet pills per patient No home 02 use per patient  No blood thinners per patient  Pt denies issues with constipation  No A fib or A flutter  EMMI video to pt or via [place] 19 guidelines implemented in PV today with Pt and RN  Pt is fully vaccinated  for Covid   Plenvu  Coupon given to pt in PV today , Code to Pharmacy and  NO PA's for preps discussed with pt  In PV today   Due to the COVID-19 pandemic we are asking patients to follow certain guidelines.  Pt aware of COVID protocols and [facility] guidelines

## 2021-01-27 ENCOUNTER — Telehealth: Payer: Self-pay | Admitting: Internal Medicine

## 2021-01-27 ENCOUNTER — Other Ambulatory Visit: Payer: Self-pay

## 2021-01-27 ENCOUNTER — Ambulatory Visit (INDEPENDENT_AMBULATORY_CARE_PROVIDER_SITE_OTHER): Payer: BC Managed Care – PPO

## 2021-01-27 DIAGNOSIS — J4541 Moderate persistent asthma with (acute) exacerbation: Secondary | ICD-10-CM | POA: Diagnosis not present

## 2021-01-27 MED ORDER — BENRALIZUMAB 30 MG/ML ~~LOC~~ SOSY
30.0000 mg | PREFILLED_SYRINGE | Freq: Once | SUBCUTANEOUS | Status: AC
  Administered 2021-01-27: 30 mg via SUBCUTANEOUS

## 2021-01-27 NOTE — Progress Notes (Signed)
Have you been hospitalized within the last 10 days?  No Do you have a fever?  No Do you have a cough?  No Do you have a headache or sore throat? No  

## 2021-01-28 ENCOUNTER — Telehealth: Payer: Self-pay | Admitting: Internal Medicine

## 2021-01-28 MED ORDER — PREDNISONE 10 MG PO TABS: ORAL_TABLET | ORAL | 0 refills | Status: DC

## 2021-01-28 MED ORDER — AZITHROMYCIN 250 MG PO TABS: ORAL_TABLET | ORAL | 0 refills | Status: DC

## 2021-01-28 NOTE — Telephone Encounter (Signed)
Error

## 2021-01-28 NOTE — Telephone Encounter (Signed)
Spoke with patient. She is aware of JD's recs. Will send medication to [facility] on [address].   Nothing further needed at time of call.

## 2021-01-28 NOTE — Telephone Encounter (Signed)
Primary Pulmonologist: CY Last office visit and with whom: 11/06/20 with CY What do we see them for (pulmonary problems): severe asthma Last OV assessment/plan: Assessment & Plan Note by Deneise Lever, MD at 12/06/2020 7:52 PM  Author: Deneise Lever, MD Author Type: Physician Filed: 12/06/2020 7:53 PM  Note Status: Written Cosign: Cosign Not Required Encounter Date: 11/06/2020  Problem: Seasonal and perennial allergic rhinitis  Editor: Deneise Lever, MD (Physician)             I don't see obvious polyps anteriorly now. Discussed symptomatic options. 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 Berna Bue has reduced need for prednisone, but we will let her keep some prednisone on hand as discussed, with holidays coming.        Patient Instructions by Deneise Lever, MD at 11/06/2020 3:00 PM  Author: Deneise Lever, MD Author Type: Physician Filed: 11/06/2020 3:24 PM  Note Status: Signed Cosign: Cosign Not Required Encounter Date: 11/06/2020  Editor: Deneise Lever, MD (Physician)             Refills sent for prednisone and nebulizer solution  I suggest you try otc chlortrimeton antihistmine. This is better at drying than the newer ones and if it makes you a little drowsy at bedtime that's ok.  We can continue Berna Bue- glad it helps  Please call if you have problems.         Was appointment offered to patient (explain)? Patient wanted recommendations first.   Reason for call: Spoke with patient. She stated that she developed a runny nose and sinus congestion 3 days ago. She has some increased sinus pressure in  between her eyes. She is still using her Trelegy inhaler and received her Fasenra injection yesterday. Still using her albuterol inhaler as needed.   She has received 2 Moderna vaccines as well as the booster vaccine.   She has not been tested for COVID. She stated that she normally gets sick this time of year and Dr. Annamaria Boots usually sends in a zpak and prednisone taper. Did advise her that she may need to get tested to be on the safe side.   (examples of things to ask: : When did symptoms start? Fever? Cough? Productive? Color to sputum? More sputum than usual? Wheezing? Have you needed increased oxygen? Are you taking your respiratory medications? What over the counter measures have you tried?)  Allergies  Allergen Reactions  . Aspirin     wheezing  . Latex   . Penicillins Hives  . Shellfish Allergy     Immunization History  Administered Date(s) Administered  . Hepatitis A, Adult 09/11/2019  . Hepatitis B 07/17/2015, 08/21/2015, 03/04/2016  . Influenza Split 08/20/2011, 09/18/2012, 08/29/2014, 08/30/2015  . Influenza Whole 08/20/2010  . Influenza, Quadrivalent, Recombinant, Inj, Pf 08/30/2019  . Influenza,inj,Quad PF,6+ Mos 09/26/2013, 09/14/2016, 10/10/2017, 09/07/2018  . Influenza-Unspecified 08/21/2015, 09/19/2016, 09/02/2020  . Moderna Sars-Covid-2 Vaccination 02/26/2020, 03/29/2020, 10/31/2020  . Pneumococcal Polysaccharide-23 09/19/2009, 08/20/2011, 07/17/2015  . Tdap 02/17/2015  . Zoster Recombinat (Shingrix) 09/11/2019   Dr. Erin Fulling, can you please advise since Dr. Annamaria Boots  is not available? Thanks!

## 2021-01-28 NOTE — Telephone Encounter (Signed)
Patient called stating that prep for procedure is still above $50 even with coupon. Please call patient.

## 2021-01-28 NOTE — Telephone Encounter (Signed)
Spoke with the patient. Then Plenvu is $67 with coupon. I offered the [place] but the patient is not sure she can drink that amount. She will pick up the Plenvu.

## 2021-01-28 NOTE — Telephone Encounter (Signed)
Please send in steroid taper and azithromycin.   Prednisone taper instructions: 40mg  x 3 days 30mg  x 3 days 20mg  x 3 days 10mg  x 3 days (30 x 10mg  tablets total)  Thanks, Wille Glaser

## 2021-02-03 ENCOUNTER — Encounter: Payer: Self-pay | Admitting: Internal Medicine

## 2021-02-05 ENCOUNTER — Ambulatory Visit (AMBULATORY_SURGERY_CENTER): Payer: BC Managed Care – PPO | Admitting: Internal Medicine

## 2021-02-05 ENCOUNTER — Encounter: Payer: Self-pay | Admitting: Internal Medicine

## 2021-02-05 ENCOUNTER — Other Ambulatory Visit: Payer: Self-pay

## 2021-02-05 VITALS — BP 134/98 | HR 66 | Temp 98.4°F | Resp 17 | Ht 67.0 in | Wt 203.0 lb

## 2021-02-05 DIAGNOSIS — Z86010 Personal history of colonic polyps: Secondary | ICD-10-CM

## 2021-02-05 DIAGNOSIS — Z1211 Encounter for screening for malignant neoplasm of colon: Secondary | ICD-10-CM

## 2021-02-05 MED ORDER — SODIUM CHLORIDE 0.9 % IV SOLN: 500.0000 mL | Freq: Once | INTRAVENOUS | Status: DC

## 2021-02-05 NOTE — Op Note (Signed)
[place] Patient Name: [name] Sides Procedure Date: 02/05/2021 11:59 AM MRN: [id number] Endoscopist: Jerene Bears , MD Age: 55 Referring MD:  Date of Birth: April 17, 1966 Gender: Female Account #: [id number] Procedure:                Colonoscopy Indications:              High risk colon cancer surveillance: Personal                            history of sessile serrated colon polyp (10 mm or                            greater in size), Last colonoscopy: December 2018 Medicines:                Monitored Anesthesia Care Procedure:                Pre-Anesthesia Assessment:                           - Prior to the procedure, a History and Physical                            was performed, and patient medications and                            allergies were reviewed. The patient's tolerance of                            previous anesthesia was also reviewed. The risks                            and benefits of the procedure and the sedation                            options and risks were discussed with the patient.                            All questions were answered, and informed consent                            was obtained. Prior Anticoagulants: The patient has                            taken no previous anticoagulant or antiplatelet                            agents. ASA Grade Assessment: III - A patient with                            severe systemic disease. After reviewing the risks                            and benefits, the patient was deemed in  satisfactory condition to undergo the procedure.                           After obtaining informed consent, the colonoscope                            was passed under direct vision. Throughout the                            procedure, the patient's blood pressure, pulse, and                            oxygen saturations were monitored continuously. The                            Olympus  [id number]-[id number]) Colonoscope was                            introduced through the anus and advanced to the                            cecum, identified by appendiceal orifice and                            ileocecal valve. The colonoscopy was performed                            without difficulty. The patient tolerated the                            procedure well. The quality of the bowel                            preparation was good. The ileocecal valve,                            appendiceal orifice, and rectum were photographed. Scope In: 12:08:53 PM Scope Out: 12:24:08 PM Scope Withdrawal Time: 0 hours 11 minutes 26 seconds  Total Procedure Duration: 0 hours 15 minutes 15 seconds  Findings:                 The digital rectal exam was normal.                           Multiple small and large-mouthed diverticula were                            found in the sigmoid colon, descending colon,                            transverse colon and ascending colon.                           Internal hemorrhoids were found during  retroflexion. The hemorrhoids were medium-sized.                           The exam was otherwise without abnormality. Complications:            No immediate complications. Estimated Blood Loss:     Estimated blood loss: none. Impression:               - Diverticulosis in the sigmoid colon, in the                            descending colon, in the transverse colon and in                            the ascending colon.                           - Internal hemorrhoids.                           - The examination was otherwise normal.                           - No specimens collected. Recommendation:           - Patient has a contact number available for                            emergencies. The signs and symptoms of potential                            delayed complications were discussed with the                            patient.  Return to normal activities tomorrow.                            Written discharge instructions were provided to the                            patient.                           - Resume previous diet.                           - Continue present medications.                           - Repeat colonoscopy in 5 years for surveillance. Jerene Bears, MD 02/05/2021 12:29:17 PM This report has been signed electronically.

## 2021-02-05 NOTE — Progress Notes (Signed)
Pt's states no medical or surgical changes since previsit or office visit. 

## 2021-02-05 NOTE — Progress Notes (Signed)
PT taken to PACU. Monitors in place. VSS. Report given to RN. 

## 2021-02-05 NOTE — Patient Instructions (Signed)
Handouts given for diverticulosis and hemorrhoids.  YOU HAD AN ENDOSCOPIC PROCEDURE TODAY AT [facility]:   Refer to the procedure report that was given to you for any specific questions about what was found during the examination.  If the procedure report does not answer your questions, please call your gastroenterologist to clarify.  If you requested that your care partner not be given the details of your procedure findings, then the procedure report has been included in a sealed envelope for you to review at your convenience later.  YOU SHOULD EXPECT: Some feelings of bloating in the abdomen. Passage of more gas than usual.  Walking can help get rid of the air that was put into your GI tract during the procedure and reduce the bloating. If you had a lower endoscopy (such as a colonoscopy or flexible sigmoidoscopy) you may notice spotting of blood in your stool or on the toilet paper. If you underwent a bowel prep for your procedure, you may not have a normal bowel movement for a few days.  Please Note:  You might notice some irritation and congestion in your nose or some drainage.  This is from the oxygen used during your procedure.  There is no need for concern and it should clear up in a day or so.  SYMPTOMS TO REPORT IMMEDIATELY:   Following lower endoscopy (colonoscopy or flexible sigmoidoscopy):  Excessive amounts of blood in the stool  Significant tenderness or worsening of abdominal pains  Swelling of the abdomen that is new, acute  Fever of 100F or higher  For urgent or emergent issues, a gastroenterologist can be reached at any hour by calling [phone]. Do not use MyChart messaging for urgent concerns.    DIET:  We do recommend a small meal at first, but then you may proceed to your regular diet.  Drink plenty of fluids but you should avoid alcoholic beverages for 24 hours.  ACTIVITY:  You should plan to take it easy for the rest of today and you should NOT  DRIVE or use heavy machinery until tomorrow (because of the sedation medicines used during the test).    FOLLOW UP: Our staff will call the number listed on your records 48-72 hours following your procedure to check on you and address any questions or concerns that you may have regarding the information given to you following your procedure. If we do not reach you, we will leave a message.  We will attempt to reach you two times.  During this call, we will ask if you have developed any symptoms of COVID 19. If you develop any symptoms (ie: fever, flu-like symptoms, shortness of breath, cough etc.) before then, please call [phone].  If you test positive for Covid 19 in the 2 weeks post procedure, please call and report this information to [place].    If any biopsies were taken you will be contacted by phone or by letter within the next 1-3 weeks.  Please call us at [phone] if you have not heard about the biopsies in 3 weeks.    SIGNATURES/CONFIDENTIALITY: You and/or your care partner have signed paperwork which will be entered into your electronic medical record.  These signatures attest to the fact that that the information above on your After Visit Summary has been reviewed and is understood.  Full responsibility of the confidentiality of this discharge information lies with you and/or your care-partner.

## 2021-02-09 ENCOUNTER — Telehealth: Payer: Self-pay | Admitting: *Deleted

## 2021-02-09 NOTE — Telephone Encounter (Signed)
  Follow up Call-  Call back number 02/05/2021  Post procedure Call Back phone  # [phone]  Permission to leave phone message Yes  Some recent data might be hidden     Patient questions:  Do you have a fever, pain , or abdominal swelling? No. Pain Score  0 *  Have you tolerated food without any problems? Yes.    Have you been able to return to your normal activities? Yes.    Do you have any questions about your discharge instructions: Diet   No. Medications  No. Follow up visit  No.  Do you have questions or concerns about your Care? No.  Actions: * If pain score is 4 or above: No action needed, pain <4.  1. Have you developed a fever since your procedure? no  2.   Have you had an respiratory symptoms (SOB or cough) since your procedure? no  3.   Have you tested positive for COVID 19 since your procedure no  4.   Have you had any family members/close contacts diagnosed with the COVID 19 since your procedure?  no   If yes to any of these questions please route to Joylene John, RN and Joella Prince, RN

## 2021-02-10 ENCOUNTER — Telehealth: Payer: Self-pay | Admitting: Internal Medicine

## 2021-02-10 NOTE — Telephone Encounter (Signed)
Fasenra Shipment Received:  30mg  #1 prefilled syringe Medication arrival date: 02/10/21 Lot #: [id number] Exp date: 07/19/2022 Received by: Elliot Dally

## 2021-02-20 ENCOUNTER — Other Ambulatory Visit (HOSPITAL_COMMUNITY): Payer: Self-pay | Admitting: Pharmacy Technician

## 2021-02-23 ENCOUNTER — Telehealth: Payer: Self-pay | Admitting: Family

## 2021-02-23 DIAGNOSIS — R609 Edema, unspecified: Secondary | ICD-10-CM

## 2021-02-23 NOTE — Telephone Encounter (Signed)
Caller : Kyiah  Call Back @ [phone]  Patient states she is retaining fluid and would like a call back in reference to being put back on medication to reduced fluid.  Please advise

## 2021-02-23 NOTE — Telephone Encounter (Signed)
Patient will like to go back on Lasix due to "retainging fluids secondary to taking prednisone" She will schedule appointment if requested by pcp

## 2021-02-24 MED ORDER — FUROSEMIDE 20 MG PO TABS: 20.0000 mg | ORAL_TABLET | Freq: Every day | ORAL | 3 refills | Status: DC | PRN

## 2021-02-24 NOTE — Telephone Encounter (Signed)
Patient advised of rx, she has appointment scheduled for 03-06-21, she will get labs that same day

## 2021-02-24 NOTE — Telephone Encounter (Signed)
Rx has been sent. I would like her to return for lab work 1 week after she begins lasix please.

## 2021-03-06 ENCOUNTER — Ambulatory Visit: Payer: BC Managed Care – PPO | Admitting: Family

## 2021-03-06 ENCOUNTER — Telehealth: Payer: Self-pay | Admitting: Family

## 2021-03-06 NOTE — Telephone Encounter (Signed)
Patient called to cancel her 4:20pm after coming later to her original appointment.pt ask if she was going to be charge$ 50.00 if she cancel appt, I said yes most likely its up to the doctor discretion.  She want the policy mailed or laoded to [employer]. She continue to say she has never been told this, however she did not keep her appt she cancel she would like for Melissa to give her a call. Policy was loaded to mychart for her to review.

## 2021-03-10 ENCOUNTER — Telehealth: Payer: Self-pay

## 2021-03-10 NOTE — Telephone Encounter (Signed)
Message forwarded to [employer]

## 2021-03-13 ENCOUNTER — Telehealth: Payer: Self-pay | Admitting: Pharmacy Technician

## 2021-03-13 NOTE — Telephone Encounter (Addendum)
Submitted a Prior Authorization request to [employer] for [facility] via Cover My Meds. Will update once we receive a response.   UGA:[id number]

## 2021-03-24 ENCOUNTER — Other Ambulatory Visit: Payer: Self-pay

## 2021-03-24 ENCOUNTER — Telehealth: Payer: Self-pay | Admitting: Family

## 2021-03-24 ENCOUNTER — Other Ambulatory Visit: Payer: Self-pay | Admitting: Pharmacy Technician

## 2021-03-24 ENCOUNTER — Ambulatory Visit (INDEPENDENT_AMBULATORY_CARE_PROVIDER_SITE_OTHER): Payer: BC Managed Care – PPO | Admitting: Family

## 2021-03-24 ENCOUNTER — Ambulatory Visit (INDEPENDENT_AMBULATORY_CARE_PROVIDER_SITE_OTHER): Payer: BC Managed Care – PPO

## 2021-03-24 ENCOUNTER — Telehealth: Payer: Self-pay | Admitting: Internal Medicine

## 2021-03-24 VITALS — BP 127/90 | HR 96 | Temp 98.2°F | Resp 16 | Ht 67.0 in | Wt 210.0 lb

## 2021-03-24 VITALS — BP 130/82 | HR 82 | Temp 98.4°F | Resp 20

## 2021-03-24 DIAGNOSIS — J4550 Severe persistent asthma, uncomplicated: Secondary | ICD-10-CM | POA: Diagnosis not present

## 2021-03-24 DIAGNOSIS — J4541 Moderate persistent asthma with (acute) exacerbation: Secondary | ICD-10-CM

## 2021-03-24 DIAGNOSIS — E785 Hyperlipidemia, unspecified: Secondary | ICD-10-CM | POA: Diagnosis not present

## 2021-03-24 DIAGNOSIS — J302 Other seasonal allergic rhinitis: Secondary | ICD-10-CM

## 2021-03-24 DIAGNOSIS — E119 Type 2 diabetes mellitus without complications: Secondary | ICD-10-CM

## 2021-03-24 DIAGNOSIS — R0789 Other chest pain: Secondary | ICD-10-CM

## 2021-03-24 DIAGNOSIS — R7303 Prediabetes: Secondary | ICD-10-CM

## 2021-03-24 LAB — LIPID PANEL
Cholesterol: 164 mg/dL (ref 0–200)
HDL: 66.5 mg/dL (ref >39.00)
LDL Cholesterol: 77 mg/dL (ref 0–99)
NonHDL: 97.62
Total CHOL/HDL Ratio: 2
Triglycerides: 105 mg/dL (ref 0.0–149.0)
VLDL: 21 mg/dL (ref 0.0–40.0)

## 2021-03-24 LAB — HEMOGLOBIN A1C: Hgb A1c MFr Bld: 6.8 % — ABNORMAL HIGH (ref 4.6–6.5)

## 2021-03-24 MED ORDER — FAMOTIDINE IN NACL 20-0.9 MG/50ML-% IV SOLN
20.0000 mg | Freq: Once | INTRAVENOUS | Status: DC | PRN
Start: 2021-03-24 — End: 2021-03-24

## 2021-03-24 MED ORDER — EPINEPHRINE 0.3 MG/0.3ML IJ SOAJ: 0.3000 mg | Freq: Once | INTRAMUSCULAR | Status: DC | PRN

## 2021-03-24 MED ORDER — ALBUTEROL SULFATE HFA 108 (90 BASE) MCG/ACT IN AERS
2.0000 | INHALATION_SPRAY | Freq: Once | RESPIRATORY_TRACT | Status: DC | PRN
Start: 2021-03-24 — End: 2021-03-24

## 2021-03-24 MED ORDER — DIPHENHYDRAMINE HCL 50 MG/ML IJ SOLN: 50.0000 mg | Freq: Once | INTRAMUSCULAR | Status: DC | PRN

## 2021-03-24 MED ORDER — METHYLPREDNISOLONE SODIUM SUCC 125 MG IJ SOLR: 125.0000 mg | Freq: Once | INTRAMUSCULAR | Status: DC | PRN

## 2021-03-24 MED ORDER — BENRALIZUMAB 30 MG/ML ~~LOC~~ SOSY
30.0000 mg | PREFILLED_SYRINGE | Freq: Once | SUBCUTANEOUS | Status: AC
  Administered 2021-03-24: 30 mg via SUBCUTANEOUS
  Filled 2021-03-24: qty 1

## 2021-03-24 MED ORDER — AZITHROMYCIN 250 MG PO TABS: ORAL_TABLET | ORAL | 0 refills | Status: DC

## 2021-03-24 MED ORDER — SODIUM CHLORIDE 0.9 % IV SOLN: Freq: Once | INTRAVENOUS | Status: DC | PRN

## 2021-03-24 NOTE — Patient Instructions (Signed)
Add flonase 2 sprays each nostril once daily.  Continue ceterizine once daily. Continue mucinex 600mg  twice daily as needed. Add nasal saline spray twice daily as needed.  Call if you develop fever, sinus/pain or pressure.  You should be contacted about scheduling your appointment with cardiology.

## 2021-03-24 NOTE — Telephone Encounter (Signed)
Please advise pt that the name of the nasal device I spoke to her about is the Air Max Tourist information centre manager- it is available at [employer], [employer] it looks like.

## 2021-03-24 NOTE — Progress Notes (Signed)
Subjective:    Patient ID: [name], female    DOB: 1966/11/02, 55 y.o.   MRN: [id number]  HPI  Patient is a 55 yr old female who presents today for follow up.  DM2- diet controlled.   Lab Results  Component Value Date   HGBA1C 6.3 (H) 11/07/2020   HGBA1C 6.5 04/07/2020   HGBA1C 6.8 (H) 01/02/2020   Lab Results  Component Value Date   MICROALBUR 3.1 (H) 01/02/2020   LDLCALC 113 (H) 04/07/2020   CREATININE 0.84 11/28/2020   DM2- diet controlled.  Lab Results  Component Value Date   HGBA1C 6.3 (H) 11/07/2020   HGBA1C 6.5 04/07/2020   HGBA1C 6.8 (H) 01/02/2020   Lab Results  Component Value Date   MICROALBUR 3.1 (H) 01/02/2020   LDLCALC 113 (H) 04/07/2020   CREATININE 0.84 11/28/2020  She is maintained on lisinopril 2.5mg  for renal protection.   Notes that she hasn't been sleeping well recently- she wakes up a lot at night.  She is having trouble breathing through her nose due to nasal congestion.   She does report intermittent chest pains.  Last one she had was when she was at work.  Felt "a little bit of pressure in her chest." She continued at work, did not have to stop working. Discomfort was non-radiating and resolved on its own.    She has been coughing up some phelgm.  Feels like her asthma has been stable.   Nasal drainage is yellow no pain.  She used mucinex which has helped some.  Asthma- on fasenra, albuterol prn. And Trellegy Ellipta.   Hyperlipidemia-  Lab Results  Component Value Date   CHOL 190 04/07/2020   HDL 53.70 04/07/2020   LDLCALC 113 (H) 04/07/2020   TRIG 116.0 04/07/2020   CHOLHDL 4 04/07/2020     Review of Systems    see HPI  Past Medical History:  Diagnosis Date  . Allergic rhinitis, cause unspecified   . Allergy   . Asthma   . Clotting disorder ([place])    DVT 2005  . Diabetes type 2, controlled ([place])    pt denies- no meds- Pre DM-   . History of DVT (deep vein thrombosis) 2005   Pt reports blood clot below knee  ?unsure which leg.  [name] Hyperlipidemia   . Nasal polyps   . Unspecified essential hypertension      Social History   Socioeconomic History  . Marital status: Married    Spouse name: Not on file  . Number of children: Not on file  . Years of education: Not on file  . Highest education level: Not on file  Occupational History  . Occupation: Therapist, [employer]  Tobacco Use  . Smoking status: Never Smoker  . Smokeless tobacco: Never Used  . Tobacco comment: Never Used Tobacco  Vaping Use  . Vaping Use: Never used  Substance and Sexual Activity  . Alcohol use: Yes    Alcohol/week: 0.0 standard drinks    Comment: 1 glass of wine per year  . Drug use: No  . Sexual activity: Yes    Birth control/protection: None  Other Topics Concern  . 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     Social Determinants of Health   Financial  Resource Strain: Not on file  Food Insecurity: Not on file  Transportation Needs: Not on file  Physical Activity: Not on file  Stress: Not on file  Social Connections: Not on file  Intimate Partner Violence: Not on file    Past Surgical History:  Procedure Laterality Date  . COLONOSCOPY    . LASIK Bilateral 2008  . NASAL SINUS SURGERY  6734   Dr Erik Obey  . OVARIAN CYST REMOVAL  02/2016   pt reported-- Dr Philis Pique  . POLYPECTOMY    . UTERINE FIBROID SURGERY  2005    Family History  Problem Relation Age of Onset  . Cancer Mother 33       ?carcinoid tumor (had surgical removal)  . Diabetes Mother        borderline  . Stomach cancer Mother   . Heart disease Father        died 65, from MI, had hx of ETOH abuse and liver disease  . Thyroid cancer Sister        (diagnosed at 24) half sister (same dad)   . Diabetes type II Brother   . Kidney disease Neg Hx   . Hypertension Neg Hx   .  Hyperlipidemia Neg Hx   . Colon cancer Neg Hx   . Esophageal cancer Neg Hx   . Rectal cancer Neg Hx   . Colon polyps Neg Hx     Allergies  Allergen Reactions  . Aspirin     wheezing  . Latex   . Penicillins Hives  . Shellfish Allergy     Current Outpatient Medications on File Prior to Visit  Medication Sig Dispense Refill  . albuterol (PROVENTIL) (2.5 MG/3ML) 0.083% nebulizer solution USE 1 VIAL IN NEBULIZER EVERY 4 HOURS AS NEEDED 150 mL PRN  . albuterol (VENTOLIN HFA) 108 (90 Base) MCG/ACT inhaler Inhale 2 puffs into the lungs every 6 (six) hours as needed for wheezing or shortness of breath. 8 g 6  . atorvastatin (LIPITOR) 10 MG tablet Take 1 tablet (10 mg total) by mouth daily. 90 tablet 1  . Benralizumab (FASENRA) 30 MG/ML SOSY Inject 1 mL (30 mg total) as directed every 8 (eight) weeks. 1 mL 5  . cetirizine (ZYRTEC) 10 MG tablet Take 10 mg by mouth daily.    . Fluticasone-Umeclidin-Vilant (TRELEGY ELLIPTA) 100-62.5-25 MCG/INH AEPB Inhale 1 puff into the lungs daily. Rinse mouth 60 each 12  . furosemide (LASIX) 20 MG tablet Take 1 tablet (20 mg total) by mouth daily as needed. 30 tablet 3  . lisinopril (ZESTRIL) 2.5 MG tablet Take 1 tablet (2.5 mg total) by mouth daily. 90 tablet 1  . OVER THE COUNTER MEDICATION [place] chews- appetite suppre, green veggie, fruit, relaxes x 4 total    . predniSONE (DELTASONE) 10 MG tablet Take 4 tabs by mouth for 3 days, then 3 for 3 days, 2 for 3 days, 1 for 3 days and stop 30 tablet 0  . Prenatal Vit-Fe Sulfate-FA (PRENATAL VITAMIN PO) Take 1 capsule by mouth daily.     No current facility-administered medications on file prior to visit.    BP 127/90 (BP Location: Right Arm, Patient Position: Sitting, Cuff Size: Large)   Pulse 96   Temp 98.2 F (36.8 C) (Oral)   Resp 16   Ht 5\' 7"  (1.702 m)   Wt 210 lb (95.3 kg)   LMP 06/22/2018   SpO2 98%   BMI 32.89 kg/m    Objective:   Physical Exam Constitutional:  Appearance: She is  well-developed.  Neck:     Thyroid: No thyromegaly.  Cardiovascular:     Rate and Rhythm: Normal rate and regular rhythm.     Heart sounds: Normal heart sounds. No murmur heard.   Pulmonary:     Effort: Pulmonary effort is normal. No respiratory distress.     Breath sounds: Wheezing (expiratory, bilateral) present.  Musculoskeletal:     Cervical back: Neck supple.  Skin:    General: Skin is warm and dry.  Neurological:     Mental Status: She is alert and oriented to person, place, and time.  Psychiatric:        Behavior: Behavior normal.        Thought Content: Thought content normal.        Judgment: Judgment normal.           Assessment & Plan:  DM2- continue lisinopril for renal protection. Discussed importance of diabetic diet.  Atypical chest pain- EKG tracing is personally reviewed.  EKG notes NSR.  No acute changes.  Given her risk factors, I will refer her to cardiology for further evaluation. She is advised to go to the ER if she develops severe/worsening chest pain.  Allergic rhinitis- Recommended trial of Airmax Nasal dilator at night for sleep.  Pt is advised as follows:  Add flonase  2 sprays each nostril once daily.  Continue ceterizine once daily.  Continue mucinex 600mg  twice daily as needed. Add nasal saline spray twice daily as needed.  Call if you develop fever, sinus/pain or pressure.   Asthma- she does have some wheezing but feels like her asthma control is stable. Defer management to pulmonary.   42 minutes spent on today's visit. Time was spent reviewing medical records, examining/counseling patient, reviewing EKG and formulating medical plan.   This visit occurred during the SARS-CoV-2 public health emergency.  Safety protocols were in place, including screening questions prior to the visit, additional usage of staff PPE, and extensive cleaning of exam room while observing appropriate contact time as indicated for disinfecting solutions.

## 2021-03-24 NOTE — Telephone Encounter (Signed)
Called and spoke with patient to let her know of recs from [place] and asked her which pharmacy for the zpak. She verified pharmacy in her chart, RX has been sent in. Nothing further needed at this time.

## 2021-03-24 NOTE — Progress Notes (Signed)
Diagnosis: Asthma  Provider:  Marshell Garfinkel, MD  Procedure: Injection  Fasenra (Benralizumab), Dose: 30 mg, Site: subcutaneous  Discharge: Condition: Good, Destination: Home . AVS provided to patient.   Performed by:  Koren Shiver, RN

## 2021-03-24 NOTE — Telephone Encounter (Signed)
ATC pt. VM box is full. WCB.  

## 2021-03-24 NOTE — Telephone Encounter (Signed)
Pt is here in the lobby for her injection and would like to know if CY could call her in something for a her cough and congestion to [facility] on [address].

## 2021-03-24 NOTE — Telephone Encounter (Signed)
Opened in error

## 2021-03-24 NOTE — Telephone Encounter (Signed)
We mean fasenra injection correct?  We can send in zpack and advise she take mucinex and otc chlortrimeton antihistmine.

## 2021-03-24 NOTE — Telephone Encounter (Signed)
Patient is here in the lobby after getting her injection, Patient states she is having symptoms of runny nose, productive cough with yellow sputum, and chest congestion that started 3 days ago. Denies fever or being tested for Covid in the last 5 days. Pharmacy is [facility].    Beth please advise

## 2021-03-25 ENCOUNTER — Encounter: Payer: Self-pay | Admitting: Family

## 2021-03-25 ENCOUNTER — Other Ambulatory Visit: Payer: Self-pay | Admitting: Internal Medicine

## 2021-03-25 DIAGNOSIS — T7840XA Allergy, unspecified, initial encounter: Secondary | ICD-10-CM | POA: Insufficient documentation

## 2021-03-25 DIAGNOSIS — E785 Hyperlipidemia, unspecified: Secondary | ICD-10-CM | POA: Insufficient documentation

## 2021-03-25 DIAGNOSIS — J339 Nasal polyp, unspecified: Secondary | ICD-10-CM | POA: Insufficient documentation

## 2021-03-25 DIAGNOSIS — D689 Coagulation defect, unspecified: Secondary | ICD-10-CM | POA: Insufficient documentation

## 2021-03-25 NOTE — Telephone Encounter (Signed)
Patient was already approved for [id number] [facility]) through [place] 01/13/21 - 01/13/22.  Auth# [id number].

## 2021-03-25 NOTE — Progress Notes (Signed)
Mailed out to patient 

## 2021-03-27 NOTE — Telephone Encounter (Signed)
Patient advised, she wrote information down

## 2021-03-31 ENCOUNTER — Other Ambulatory Visit: Payer: Self-pay | Admitting: *Deleted

## 2021-03-31 MED ORDER — TRELEGY ELLIPTA 100-62.5-25 MCG/INH IN AEPB: INHALATION_SPRAY | RESPIRATORY_TRACT | 1 refills | Status: DC

## 2021-03-31 NOTE — Progress Notes (Signed)
Cardiology Office Note:    Date:  04/02/2021   ID:  [name], DOB [birth year], MRN [id number]  PCP:  [name], NP  Cardiologist:  Shirlee More, MD   Referring MD: [name], NP  ASSESSMENT:    1. Chest pain of uncertain etiology   2. Mixed hyperlipidemia   3. Controlled type 2 diabetes mellitus without complication, without long-term current use of insulin (HCC)    PLAN:    In order of problems listed above:  1. Clinically her chest pain is typical costochondral pain syndrome with repetitive lifting.  She is intolerant of nonsteroidal anti-inflammatory drugs with bronchospasm symptoms are minor and frequently takes Tylenol with relief continue the same.  If symptoms worsen should be appropriate physical therapy modalities including iontophoresis with a high potency steroid to the chest wall.  Unfortunately she is at increased cardiovascular risk of prediabetes hyperlipidemia hypertension and I have asked her to order to better define her risk plan coronary calcium score performed.  If her score is greater than 100 or greater than 75th percentile I favor an ischemia evaluation despite the paucity of symptoms.  If her symptoms worsened and became more typical exertional limiting relieved with rest I would also favor cardiac CTA.  I told her we would contact her after calcium score with a recommendation.  In the interim I will continue her current statin. 2. Stable continue statin increased cardiovascular risk prediabetes and hypertension 3. Stable I think she is best categorized as prediabetes  Next appointment as needed the results cardiac calcium score   Medication Adjustments/Labs and Tests Ordered: Current medicines are reviewed at length with the patient today.  Concerns regarding medicines are outlined above.  Orders Placed This Encounter  Procedures  . CT CARDIAC SCORING (SELF PAY ONLY)   No orders of the defined types were placed in this  encounter.    Chief Complaint  Patient presents with  . Chest Pain    On and off    History of Present Illness:    [name] is a 55 y.o. female who is being seen today for the evaluation of chest pain at the request of [name], NP. His background history of prediabetes hyperlipidemia and previous deep vein thrombosis 2005 and asthma.  She was anticoagulated for about 6 months with seen by hematology and not felt to have hypercoagulable state he has had no recurrent venous thrombosis or pulmonary embolus.  EKG 03/24/2021 independently reviewed shows sinus rhythm and is normal  She was seen by my partner Dr. Golden Hurter 2016 for an abnormal EKG with short PR interval without evidence of preexcitation.  Few times a month she has chest discomfort is very mild localized left sternal border momentary not severe sustained not short of breath or pleuritic in nature.  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  No exertional chest pain shortness of breath palpitation or syncope.  She has no history of congenital rheumatic heart disease or heart murmur. Past Medical History:  Diagnosis Date  . Allergic rhinitis, cause unspecified   . Allergy   . Asthma   . Clotting disorder ([place])    DVT 2005  . Diabetes type 2, controlled ([place])    pt denies- no meds- Pre DM-   . History of DVT (deep vein thrombosis) 2005   Pt reports blood clot below knee ?unsure which leg.  [name] Hyperlipidemia   .  Nasal polyps   . Unspecified essential hypertension     Past Surgical History:  Procedure Laterality Date  . COLONOSCOPY    . LASIK Bilateral 2008  . NASAL SINUS SURGERY  1497   Dr Erik Obey  . OVARIAN CYST REMOVAL  02/2016   pt reported-- Dr Philis Pique  . POLYPECTOMY    . UTERINE FIBROID SURGERY  2005    Current Medications: Current Meds  Medication Sig  . albuterol (PROVENTIL) (2.5 MG/3ML) 0.083% nebulizer solution  Take 2.5 mg by nebulization every 4 (four) hours as needed for wheezing or shortness of breath.  [name] albuterol (VENTOLIN HFA) 108 (90 Base) MCG/ACT inhaler Inhale 2 puffs into the lungs every 6 (six) hours as needed for wheezing or shortness of breath.  [name] atorvastatin (LIPITOR) 10 MG tablet Take 1 tablet (10 mg total) by mouth daily.  . Benralizumab (FASENRA) 30 MG/ML SOSY Inject 1 mL (30 mg total) as directed every 8 (eight) weeks.  . cetirizine (ZYRTEC) 10 MG tablet Take 10 mg by mouth daily.  . Fluticasone-Umeclidin-Vilant (TRELEGY ELLIPTA) 100-62.5-25 MCG/INH AEPB Inhale 1 puff into the lungs daily as needed.  . furosemide (LASIX) 20 MG tablet Take 20 mg by mouth daily as needed for edema.  [name] lisinopril (ZESTRIL) 2.5 MG tablet Take 1 tablet (2.5 mg total) by mouth daily.  [name] OVER THE COUNTER MEDICATION Take 1 Dose by mouth daily. Goli chews- appetite suppre, green veggie, fruit, relaxes x 4 total  . Prenatal Vit-Fe Sulfate-FA (PRENATAL VITAMIN PO) Take 1 capsule by mouth daily.     Allergies:   Aspirin, Latex, Penicillins, and Shellfish allergy   Social History   Socioeconomic History  . Marital status: Married    Spouse name: Not on file  . Number of children: Not on file  . Years of education: Not on file  . Highest education level: Not on file  Occupational History  . Occupation: Therapist, art club  Tobacco Use  . Smoking status: Never Smoker  . Smokeless tobacco: Never Used  . Tobacco comment: Never Used Tobacco  Vaping Use  . Vaping Use: Never used  Substance and Sexual Activity  . Alcohol use: Yes    Alcohol/week: 0.0 standard drinks    Comment: 1 glass of wine per year  . Drug use: No  . Sexual activity: Yes    Birth control/protection: None  Other Topics Concern  . 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      Social Determinants of Health   Financial Resource Strain: Not on file  Food Insecurity: Not on file  Transportation Needs: Not on file  Physical Activity: Not on file  Stress: Not on file  Social Connections: Not on file     Family History: The patient's family history includes Cancer (age of onset: 15) in her mother; Diabetes in her mother; Diabetes type II in her brother; Heart disease in her father; Stomach cancer in her mother; Thyroid cancer in her sister. There is no history of Kidney disease, Hypertension, Hyperlipidemia, Colon cancer, Esophageal cancer, Rectal cancer, or Colon polyps.  ROS:   ROS Please see the history of present illness.     All other systems reviewed and are negative.  EKGs/Labs/Other Studies Reviewed:    The following studies were reviewed today:  Recent Labs: 04/07/2020: ALT 18; Hemoglobin 13.4; Platelets 270.0; TSH 1.65 11/28/2020: BUN 13; Creatinine, Ser 0.84; Potassium 4.0; Sodium 143  Recent Lipid Panel    Component Value Date/Time   CHOL 164 03/24/2021 1203   TRIG 105.0 03/24/2021 1203   HDL 66.50 03/24/2021 1203   CHOLHDL 2 03/24/2021 1203   VLDL 21.0 03/24/2021 1203   LDLCALC 77 03/24/2021 1203   LDLCALC 90 08/17/2019 1442    Physical Exam:    VS:  BP 110/84 (BP Location: Right Arm, Patient Position: Sitting, Cuff Size: Normal)   Pulse 96   Ht 5\' 7"  (1.702 m)   Wt 212 lb (96.2 kg)   LMP 06/22/2018   SpO2 95%   BMI 33.20 kg/m     Wt Readings from Last 3 Encounters:  04/02/21 212 lb (96.2 kg)  03/24/21 210 lb (95.3 kg)  02/05/21 203 lb (92.1 kg)     GEN: Overweight well nourished, well developed in no acute distress HEENT: Normal NECK: No JVD; No carotid bruits LYMPHATICS: No lymphadenopathy CARDIAC: She has tenderness to the left costochondral junction reproducing her clinical complaint RRR, no murmurs, rubs, gallops RESPIRATORY:  Clear to auscultation without rales, wheezing or rhonchi  ABDOMEN: Soft, non-tender,  non-distended MUSCULOSKELETAL:  No edema; No deformity  SKIN: Warm and dry NEUROLOGIC:  Alert and oriented x 3 PSYCHIATRIC:  Normal affect     Signed, Shirlee More, MD  04/02/2021 4:53 PM    [facility]

## 2021-04-02 ENCOUNTER — Ambulatory Visit (INDEPENDENT_AMBULATORY_CARE_PROVIDER_SITE_OTHER): Payer: BC Managed Care – PPO | Admitting: Cardiology

## 2021-04-02 ENCOUNTER — Encounter: Payer: Self-pay | Admitting: Cardiology

## 2021-04-02 ENCOUNTER — Other Ambulatory Visit: Payer: Self-pay

## 2021-04-02 VITALS — BP 110/84 | HR 96 | Ht 67.0 in | Wt 212.0 lb

## 2021-04-02 DIAGNOSIS — E119 Type 2 diabetes mellitus without complications: Secondary | ICD-10-CM

## 2021-04-02 DIAGNOSIS — R079 Chest pain, unspecified: Secondary | ICD-10-CM | POA: Diagnosis not present

## 2021-04-02 DIAGNOSIS — E782 Mixed hyperlipidemia: Secondary | ICD-10-CM | POA: Diagnosis not present

## 2021-04-02 NOTE — Patient Instructions (Signed)
Medication Instructions:  Your physician recommends that you continue on your current medications as directed. Please refer to the Current Medication list given to you today.  *If you need a refill on your cardiac medications before your next appointment, please call your pharmacy*   Lab Work: None If you have labs (blood work) drawn today and your tests are completely normal, you will receive your results only by: [name] MyChart Message (if you have MyChart) OR . A paper copy in the mail If you have any lab test that is abnormal or we need to change your treatment, we will call you to review the results.   Testing/Procedures: We have put in the order for you to have a CT calcium score completed. They will call you to schedule this.    Follow-Up: At [facility], you and your health needs are our priority.  As part of our continuing mission to provide you with exceptional heart care, we have created designated Provider Care Teams.  These Care Teams include your primary Cardiologist (physician) and Advanced Practice Providers (APPs -  Physician Assistants and Nurse Practitioners) who all work together to provide you with the care you need, when you need it.  We recommend signing up for the patient portal called "MyChart".  Sign up information is provided on this After Visit Summary.  MyChart is used to connect with patients for Virtual Visits (Telemedicine).  Patients are able to view lab/test results, encounter notes, upcoming appointments, etc.  Non-urgent messages can be sent to your provider as well.   To learn more about what you can do with MyChart, go to [email or web address].    Your next appointment:   As needed  The format for your next appointment:   In Person  Provider:   Shirlee More, MD   Other Instructions

## 2021-04-19 ENCOUNTER — Other Ambulatory Visit: Payer: Self-pay | Admitting: Internal Medicine

## 2021-05-08 ENCOUNTER — Ambulatory Visit (INDEPENDENT_AMBULATORY_CARE_PROVIDER_SITE_OTHER)
Admission: RE | Admit: 2021-05-08 | Discharge: 2021-05-08 | Disposition: A | Discharge status: Home / Self Care | Payer: Self-pay | Source: Ambulatory Visit | Attending: Cardiology | Admitting: Cardiology

## 2021-05-08 ENCOUNTER — Other Ambulatory Visit: Payer: Self-pay

## 2021-05-08 DIAGNOSIS — R079 Chest pain, unspecified: Secondary | ICD-10-CM

## 2021-05-11 ENCOUNTER — Telehealth: Payer: Self-pay

## 2021-05-11 DIAGNOSIS — R931 Abnormal findings on diagnostic imaging of heart and coronary circulation: Secondary | ICD-10-CM

## 2021-05-11 NOTE — Telephone Encounter (Signed)
Spoke with patient regarding results and recommendation.  Patient verbalizes understanding and is agreeable to plan of care. Advised patient to call back with any issues or concerns.     [facility] at [facility] [address] [place], [place] [address] Phone: [phone]    Please arrive 15 minutes prior to your appointment time for registration and insurance purposes.  The test will take approximately 3 to 4 hours to complete; you may bring reading material.  If someone comes with you to your appointment, they will need to remain in the main lobby due to limited space in the testing area. **If you are pregnant or breastfeeding, please notify the nuclear lab prior to your appointment**  How to prepare for your Myocardial Perfusion Test: . Do not eat or drink 3 hours prior to your test, except you may have water. . Do not consume products containing caffeine (regular or decaffeinated) 12 hours prior to your test. (ex: coffee, chocolate, sodas, tea). . Do bring a list of your current medications with you.  If not listed below, you may take your medications as normal. . Do wear comfortable clothes (no dresses or overalls) and walking shoes, tennis shoes preferred (No heels or open toe shoes are allowed). . Do NOT wear cologne, perfume, aftershave, or lotions (deodorant is allowed). . If these instructions are not followed, your test will have to be rescheduled.  Please report to [address] for your test.  If you have questions or concerns about your appointment, you can call the Nuclear Lab at [phone].  If you cannot keep your appointment, please provide 24 hours notification to the Nuclear Lab, to avoid a possible $50 charge to your account.

## 2021-05-11 NOTE — Telephone Encounter (Signed)
-----   Message from Richardo Priest, MD sent at 05/11/2021  7:45 AM EDT ----- Her calcium score is elevated for age 15th percentile would like her to undergo myocardial perfusion test treadmill [employer].

## 2021-05-12 NOTE — Addendum Note (Signed)
Addended by: Shirlee More on: 05/12/2021 08:02 AM   Modules accepted: Orders

## 2021-05-15 ENCOUNTER — Telehealth: Payer: Self-pay | Admitting: Internal Medicine

## 2021-05-15 MED ORDER — DOXYCYCLINE HYCLATE 100 MG PO TABS: 100.0000 mg | ORAL_TABLET | Freq: Two times a day (BID) | ORAL | 0 refills | Status: DC

## 2021-05-15 NOTE — Telephone Encounter (Signed)
Doxycycline 100 mg, # 14, 1 twice daily 

## 2021-05-15 NOTE — Telephone Encounter (Signed)
Rx for doxycycline has been sent to preferred pharmacy.  Patient is aware and voiced his understanding.  Nothing further needed at this time.

## 2021-05-15 NOTE — Telephone Encounter (Signed)
Called and spoke patient.  Patient is requesting an Rx for abx.  She reports of prod cough with yellow sputum, wheezing, increased sob with exertion and chest/nasal x2d. Denied fever, chills or sweats.  Not using OTC meds to help with sx.  Using albuterol HFA BID and trelegy daily with temporary relief in sx. Fully vaccinated against covid and flu.  Dr. Annamaria Boots, please advise.  Current Outpatient Medications on File Prior to Visit  Medication Sig Dispense Refill  . albuterol (PROVENTIL) (2.5 MG/3ML) 0.083% nebulizer solution Take 2.5 mg by nebulization every 4 (four) hours as needed for wheezing or shortness of breath.    [name] albuterol (VENTOLIN HFA) 108 (90 Base) MCG/ACT inhaler Inhale 2 puffs into the lungs every 6 (six) hours as needed for wheezing or shortness of breath. 8 g 6  . atorvastatin (LIPITOR) 10 MG tablet Take 1 tablet (10 mg total) by mouth daily. 90 tablet 1  . Benralizumab (FASENRA) 30 MG/ML SOSY Inject 1 mL (30 mg total) as directed every 8 (eight) weeks. 1 mL 5  . cetirizine (ZYRTEC) 10 MG tablet Take 10 mg by mouth daily.    . Fluticasone-Umeclidin-Vilant (TRELEGY ELLIPTA) 100-62.5-25 MCG/INH AEPB Inhale 1 puff into the lungs daily as needed.    . furosemide (LASIX) 20 MG tablet Take 20 mg by mouth daily as needed for edema.    [name] lisinopril (ZESTRIL) 2.5 MG tablet Take 1 tablet (2.5 mg total) by mouth daily. 90 tablet 1  . OVER THE COUNTER MEDICATION Take 1 Dose by mouth daily. Goli chews- appetite suppre, green veggie, fruit, relaxes x 4 total    . Prenatal Vit-Fe Sulfate-FA (PRENATAL VITAMIN PO) Take 1 capsule by mouth daily.     No current facility-administered medications on file prior to visit.    Allergies  Allergen Reactions  . Aspirin     wheezing  . Latex   . Penicillins Hives  . Shellfish Allergy

## 2021-05-19 ENCOUNTER — Ambulatory Visit: Payer: BC Managed Care – PPO

## 2021-05-20 ENCOUNTER — Other Ambulatory Visit: Payer: Self-pay | Admitting: *Deleted

## 2021-05-20 ENCOUNTER — Telehealth (HOSPITAL_COMMUNITY): Payer: Self-pay | Admitting: *Deleted

## 2021-05-20 MED ORDER — LISINOPRIL 2.5 MG PO TABS: 2.5000 mg | ORAL_TABLET | Freq: Every day | ORAL | 1 refills | Status: DC

## 2021-05-20 MED ORDER — ATORVASTATIN CALCIUM 10 MG PO TABS: 10.0000 mg | ORAL_TABLET | Freq: Every day | ORAL | 1 refills | Status: DC

## 2021-05-20 NOTE — Telephone Encounter (Signed)
Patient given detailed instructions per Myocardial Perfusion Study Information Sheet for the test on 05/25/2021 at 10:30. Patient notified to arrive 15 minutes early and that it is imperative to arrive on time for appointment to keep from having the test rescheduled.  If you need to cancel or reschedule your appointment, please call the office within 24 hours of your appointment. . Patient verbalized understanding.[name]

## 2021-05-25 ENCOUNTER — Telehealth: Payer: Self-pay

## 2021-05-25 ENCOUNTER — Ambulatory Visit (HOSPITAL_COMMUNITY): Payer: BC Managed Care – PPO | Attending: Cardiology

## 2021-05-25 ENCOUNTER — Ambulatory Visit (INDEPENDENT_AMBULATORY_CARE_PROVIDER_SITE_OTHER): Payer: BC Managed Care – PPO

## 2021-05-25 ENCOUNTER — Other Ambulatory Visit: Payer: Self-pay

## 2021-05-25 VITALS — BP 121/84 | HR 67 | Temp 98.0°F | Resp 20

## 2021-05-25 VITALS — Ht 67.0 in | Wt 212.0 lb

## 2021-05-25 DIAGNOSIS — J4550 Severe persistent asthma, uncomplicated: Secondary | ICD-10-CM

## 2021-05-25 DIAGNOSIS — R931 Abnormal findings on diagnostic imaging of heart and coronary circulation: Secondary | ICD-10-CM | POA: Diagnosis not present

## 2021-05-25 DIAGNOSIS — R9431 Abnormal electrocardiogram [ECG] [EKG]: Secondary | ICD-10-CM | POA: Insufficient documentation

## 2021-05-25 DIAGNOSIS — J4541 Moderate persistent asthma with (acute) exacerbation: Secondary | ICD-10-CM

## 2021-05-25 LAB — MYOCARDIAL PERFUSION IMAGING
Estimated workload: 7 METS
Exercise duration (min): 6 min
Exercise duration (sec): 0 s
LV dias vol: 70 mL (ref 46–106)
LV sys vol: 28 mL
MPHR: 167 {beats}/min
Peak HR: 157 {beats}/min
Percent HR: 94 %
Rest HR: 93 {beats}/min
SDS: 1
SRS: 0
SSS: 1
TID: 1.06

## 2021-05-25 MED ORDER — DIPHENHYDRAMINE HCL 50 MG/ML IJ SOLN: 50.0000 mg | Freq: Once | INTRAMUSCULAR | Status: DC | PRN

## 2021-05-25 MED ORDER — EPINEPHRINE 0.3 MG/0.3ML IJ SOAJ: 0.3000 mg | Freq: Once | INTRAMUSCULAR | Status: DC | PRN

## 2021-05-25 MED ORDER — FAMOTIDINE IN NACL 20-0.9 MG/50ML-% IV SOLN: 20.0000 mg | Freq: Once | INTRAVENOUS | Status: DC | PRN

## 2021-05-25 MED ORDER — TECHNETIUM TC 99M TETROFOSMIN IV KIT
32.5000 | PACK | Freq: Once | INTRAVENOUS | Status: AC | PRN
Start: 2021-05-25 — End: 2021-05-25
  Administered 2021-05-25: 32.5 via INTRAVENOUS
  Filled 2021-05-25: qty 33

## 2021-05-25 MED ORDER — TECHNETIUM TC 99M TETROFOSMIN IV KIT
10.8000 | PACK | Freq: Once | INTRAVENOUS | Status: AC | PRN
  Administered 2021-05-25: 10.8 via INTRAVENOUS
  Filled 2021-05-25: qty 11

## 2021-05-25 MED ORDER — ALBUTEROL SULFATE HFA 108 (90 BASE) MCG/ACT IN AERS: 2.0000 | INHALATION_SPRAY | Freq: Once | RESPIRATORY_TRACT | Status: DC | PRN

## 2021-05-25 MED ORDER — METHYLPREDNISOLONE SODIUM SUCC 125 MG IJ SOLR: 125.0000 mg | Freq: Once | INTRAMUSCULAR | Status: DC | PRN

## 2021-05-25 MED ORDER — SODIUM CHLORIDE 0.9 % IV SOLN: Freq: Once | INTRAVENOUS | Status: DC | PRN

## 2021-05-25 MED ORDER — BENRALIZUMAB 30 MG/ML ~~LOC~~ SOSY
30.0000 mg | PREFILLED_SYRINGE | Freq: Once | SUBCUTANEOUS | Status: AC
  Administered 2021-05-25: 30 mg via SUBCUTANEOUS
  Filled 2021-05-25: qty 1

## 2021-05-25 NOTE — Progress Notes (Signed)
Diagnosis: Asthma  Provider:  Marshell Garfinkel, MD  Procedure: Injection  Fasenra (Benralizumab), Dose: 30 mg, Site: subcutaneous  Discharge: Condition: Good, Destination: Home . AVS provided to patient.   Performed by:  Koren Shiver, RN

## 2021-05-25 NOTE — Telephone Encounter (Signed)
-----   Message from Richardo Priest, MD sent at 05/25/2021  4:25 PM EDT ----- Good result images are normal no findings of blocked arteries.

## 2021-05-25 NOTE — Telephone Encounter (Signed)
Spoke with patient regarding results and recommendation.  Patient verbalizes understanding and is agreeable to plan of care. Advised patient to call back with any issues or concerns.  

## 2021-06-02 ENCOUNTER — Telehealth: Payer: Self-pay | Admitting: Internal Medicine

## 2021-06-03 NOTE — Telephone Encounter (Signed)
I have messaged the scheduling team to get her scheduled and will call [facility] to arrange for med delivery. Ty Kim.

## 2021-06-09 ENCOUNTER — Telehealth: Payer: Self-pay | Admitting: Family

## 2021-06-09 NOTE — Telephone Encounter (Signed)
Patient states she has a billing question. Patient refuse to give me any information.

## 2021-06-10 ENCOUNTER — Telehealth: Payer: Self-pay | Admitting: Internal Medicine

## 2021-06-10 NOTE — Telephone Encounter (Signed)
Staff messaged received from [facility].  Patient requested Trelegy coupons and refills. Called and spoke with Patient.  Patient requested Trelegy coupons. Explained to Patient that coupons and samples are not delivered to office like they were at one time.  Patient stated Trelegy is expensive and wondered  if there was a cheaper inhaler Patient could try. Patient also requested Prednisone 200 tabs to be sent to [facility].  Message routed to Dr. Annamaria Boots to advise  Allergies  Allergen Reactions   Aspirin     wheezing   Latex    Penicillins Hives   Shellfish Allergy    Current Outpatient Medications on File Prior to Visit  Medication Sig Dispense Refill   albuterol (PROVENTIL) (2.5 MG/3ML) 0.083% nebulizer solution Take 2.5 mg by nebulization every 4 (four) hours as needed for wheezing or shortness of breath.     albuterol (VENTOLIN HFA) 108 (90 Base) MCG/ACT inhaler Inhale 2 puffs into the lungs every 6 (six) hours as needed for wheezing or shortness of breath. 8 g 6   atorvastatin (LIPITOR) 10 MG tablet Take 1 tablet (10 mg total) by mouth daily. 90 tablet 1   Benralizumab (FASENRA) 30 MG/ML SOSY Inject 1 mL (30 mg total) as directed every 8 (eight) weeks. 1 mL 5   cetirizine (ZYRTEC) 10 MG tablet Take 10 mg by mouth daily.     doxycycline (VIBRA-TABS) 100 MG tablet Take 1 tablet (100 mg total) by mouth 2 (two) times daily. 14 tablet 0   Fluticasone-Umeclidin-Vilant (TRELEGY ELLIPTA) 100-62.5-25 MCG/INH AEPB Inhale 1 puff into the lungs daily as needed.     furosemide (LASIX) 20 MG tablet Take 20 mg by mouth daily as needed for edema.     lisinopril (ZESTRIL) 2.5 MG tablet Take 1 tablet (2.5 mg total) by mouth daily. 90 tablet 1   OVER THE COUNTER MEDICATION Take 1 Dose by mouth daily. Goli chews- appetite suppre, green veggie, fruit, relaxes x 4 total     Prenatal Vit-Fe Sulfate-FA (PRENATAL VITAMIN PO) Take 1 capsule by mouth daily.     No current  facility-administered medications on file prior to visit.

## 2021-06-10 NOTE — Telephone Encounter (Signed)
I spoke with patient.  She had a $50 no show fee from 03/06/21 where she had an emergency and had to cancel her appointment with PCP at the last minute.  I advised patient I would notify the [employer] department and ask for it to be removed from her account.  I am sending email to them today.

## 2021-06-11 MED ORDER — ALBUTEROL SULFATE HFA 108 (90 BASE) MCG/ACT IN AERS: 2.0000 | INHALATION_SPRAY | Freq: Four times a day (QID) | RESPIRATORY_TRACT | 6 refills | Status: DC | PRN

## 2021-06-11 MED ORDER — PREDNISONE 10 MG PO TABS: 10.0000 mg | ORAL_TABLET | Freq: Every day | ORAL | 0 refills | Status: DC

## 2021-06-11 NOTE — Telephone Encounter (Signed)
Ok to refill prednisone as requested  Suggest we script Bevespi # 1, ref x 12   inhale 2 puffs then rinse mouth, twice daily       She can check to see if this would be cheaper for her than Trelegy.

## 2021-06-11 NOTE — Telephone Encounter (Signed)
Spoke to patient and relayed below message. Prednisone and ventolin sent to pharmacy as requested by patient. Patient will contact insurance regarding cost of Bevespi and call back with update.

## 2021-06-11 NOTE — Telephone Encounter (Signed)
Spoke to [place] with [employer] and verified quantity of 200 tabs for prednisone. Janett Billow stated that patient will only pay for 90 days, however they will hold remaining on patient's profile.   Lm for patient.

## 2021-06-11 NOTE — Telephone Encounter (Signed)
[name] from [facility] checking on Media for Prednisone. [name] phone number is [phone].

## 2021-06-24 LAB — HM MAMMOGRAPHY

## 2021-06-26 ENCOUNTER — Ambulatory Visit: Payer: BC Managed Care – PPO

## 2021-07-23 ENCOUNTER — Ambulatory Visit (INDEPENDENT_AMBULATORY_CARE_PROVIDER_SITE_OTHER): Payer: BC Managed Care – PPO

## 2021-07-23 ENCOUNTER — Other Ambulatory Visit: Payer: Self-pay

## 2021-07-23 VITALS — BP 130/67 | HR 107 | Temp 98.5°F | Resp 20

## 2021-07-23 DIAGNOSIS — J4541 Moderate persistent asthma with (acute) exacerbation: Secondary | ICD-10-CM | POA: Diagnosis not present

## 2021-07-23 DIAGNOSIS — J4550 Severe persistent asthma, uncomplicated: Secondary | ICD-10-CM | POA: Diagnosis not present

## 2021-07-23 MED ORDER — ALBUTEROL SULFATE HFA 108 (90 BASE) MCG/ACT IN AERS: 2.0000 | INHALATION_SPRAY | Freq: Once | RESPIRATORY_TRACT | Status: DC | PRN

## 2021-07-23 MED ORDER — FAMOTIDINE IN NACL 20-0.9 MG/50ML-% IV SOLN: 20.0000 mg | Freq: Once | INTRAVENOUS | Status: DC | PRN

## 2021-07-23 MED ORDER — BENRALIZUMAB 30 MG/ML ~~LOC~~ SOSY
30.0000 mg | PREFILLED_SYRINGE | Freq: Once | SUBCUTANEOUS | Status: AC
  Administered 2021-07-23: 30 mg via SUBCUTANEOUS

## 2021-07-23 MED ORDER — METHYLPREDNISOLONE SODIUM SUCC 125 MG IJ SOLR: 125.0000 mg | Freq: Once | INTRAMUSCULAR | Status: DC | PRN

## 2021-07-23 MED ORDER — EPINEPHRINE 0.3 MG/0.3ML IJ SOAJ: 0.3000 mg | Freq: Once | INTRAMUSCULAR | Status: DC | PRN

## 2021-07-23 MED ORDER — DIPHENHYDRAMINE HCL 50 MG/ML IJ SOLN: 50.0000 mg | Freq: Once | INTRAMUSCULAR | Status: DC | PRN

## 2021-07-23 MED ORDER — SODIUM CHLORIDE 0.9 % IV SOLN: Freq: Once | INTRAVENOUS | Status: DC | PRN

## 2021-07-23 NOTE — Progress Notes (Signed)
Diagnosis: Asthma  Provider:  Marshell Garfinkel, MD  Procedure: Injection  Fasenra (Benralizumab), Dose: 30 mg, Site: subcutaneous right arm   Discharge: Condition: Good, Destination: Home . AVS provided to patient.   Performed by:  Avana Kreiser, Sherlon Handing, LPN

## 2021-07-28 NOTE — Telephone Encounter (Signed)
error 

## 2021-07-31 DIAGNOSIS — Z9889 Other specified postprocedural states: Secondary | ICD-10-CM | POA: Insufficient documentation

## 2021-08-12 ENCOUNTER — Telehealth: Payer: Self-pay | Admitting: Internal Medicine

## 2021-08-12 DIAGNOSIS — J4541 Moderate persistent asthma with (acute) exacerbation: Secondary | ICD-10-CM

## 2021-08-12 MED ORDER — FASENRA 30 MG/ML ~~LOC~~ SOSY: 30.0000 mg | PREFILLED_SYRINGE | SUBCUTANEOUS | 2 refills | Status: DC

## 2021-08-12 NOTE — Telephone Encounter (Signed)
Refill for [facility] sent to [facility]. Patient has f/u visit with Dr. Annamaria Boots on 11/05/21.   Patient receives at infusion center.  Knox Saliva, PharmD, MPH, BCPS Clinical Pharmacist (Rheumatology and Pulmonology)

## 2021-08-12 NOTE — Telephone Encounter (Addendum)
Returned call to [facility]. Patient just needs to have a new Fasenra prescription sent in. Routing to [facility] to assist.

## 2021-08-14 ENCOUNTER — Telehealth: Payer: Self-pay | Admitting: Internal Medicine

## 2021-08-14 NOTE — Telephone Encounter (Signed)
[facility] not able to process request for [name], notified p 530 pm 08/14/2021 when chart not available to me and called pt to be sure she was not in immediate need for medication  Will need to call [facility] on 8/29 to see what the problem is.  Nothing needed over weekend

## 2021-08-26 DIAGNOSIS — J45909 Unspecified asthma, uncomplicated: Secondary | ICD-10-CM | POA: Diagnosis not present

## 2021-08-26 DIAGNOSIS — J324 Chronic pansinusitis: Secondary | ICD-10-CM | POA: Diagnosis not present

## 2021-08-26 DIAGNOSIS — J339 Nasal polyp, unspecified: Secondary | ICD-10-CM | POA: Diagnosis not present

## 2021-08-26 DIAGNOSIS — J342 Deviated nasal septum: Secondary | ICD-10-CM | POA: Diagnosis not present

## 2021-08-26 DIAGNOSIS — J3489 Other specified disorders of nose and nasal sinuses: Secondary | ICD-10-CM | POA: Diagnosis not present

## 2021-08-26 DIAGNOSIS — J330 Polyp of nasal cavity: Secondary | ICD-10-CM | POA: Diagnosis not present

## 2021-08-26 DIAGNOSIS — Z886 Allergy status to analgesic agent status: Secondary | ICD-10-CM | POA: Diagnosis not present

## 2021-09-17 ENCOUNTER — Ambulatory Visit: Payer: BC Managed Care – PPO

## 2021-09-18 ENCOUNTER — Ambulatory Visit (INDEPENDENT_AMBULATORY_CARE_PROVIDER_SITE_OTHER): Payer: BC Managed Care – PPO

## 2021-09-18 ENCOUNTER — Other Ambulatory Visit: Payer: Self-pay

## 2021-09-18 VITALS — BP 138/86 | HR 87 | Temp 97.7°F | Resp 18

## 2021-09-18 DIAGNOSIS — J4550 Severe persistent asthma, uncomplicated: Secondary | ICD-10-CM | POA: Diagnosis not present

## 2021-09-18 DIAGNOSIS — J4541 Moderate persistent asthma with (acute) exacerbation: Secondary | ICD-10-CM | POA: Diagnosis not present

## 2021-09-18 MED ORDER — METHYLPREDNISOLONE SODIUM SUCC 125 MG IJ SOLR: 125.0000 mg | Freq: Once | INTRAMUSCULAR | Status: DC | PRN

## 2021-09-18 MED ORDER — BENRALIZUMAB 30 MG/ML ~~LOC~~ SOSY
30.0000 mg | PREFILLED_SYRINGE | Freq: Once | SUBCUTANEOUS | Status: AC
  Administered 2021-09-18: 30 mg via SUBCUTANEOUS
  Filled 2021-09-18: qty 1

## 2021-09-18 MED ORDER — DIPHENHYDRAMINE HCL 50 MG/ML IJ SOLN: 50.0000 mg | Freq: Once | INTRAMUSCULAR | Status: DC | PRN

## 2021-09-18 MED ORDER — ALBUTEROL SULFATE HFA 108 (90 BASE) MCG/ACT IN AERS: 2.0000 | INHALATION_SPRAY | Freq: Once | RESPIRATORY_TRACT | Status: DC | PRN

## 2021-09-18 MED ORDER — FAMOTIDINE IN NACL 20-0.9 MG/50ML-% IV SOLN: 20.0000 mg | Freq: Once | INTRAVENOUS | Status: DC | PRN

## 2021-09-18 MED ORDER — EPINEPHRINE 0.3 MG/0.3ML IJ SOAJ: 0.3000 mg | Freq: Once | INTRAMUSCULAR | Status: DC | PRN

## 2021-09-18 MED ORDER — SODIUM CHLORIDE 0.9 % IV SOLN: Freq: Once | INTRAVENOUS | Status: DC | PRN

## 2021-09-18 NOTE — Progress Notes (Signed)
Diagnosis: Asthma  Provider:  Marshell Garfinkel, MD  Procedure: Injection  Fasenra (Benralizumab), Dose: 30 mg, Site: subcutaneous  Discharge: Condition: Good, Destination: Home . AVS provided to patient.   Performed by:  Arnoldo Morale, RN

## 2021-09-28 ENCOUNTER — Telehealth: Payer: Self-pay | Admitting: Internal Medicine

## 2021-09-28 MED ORDER — DOXYCYCLINE HYCLATE 100 MG PO TABS: ORAL_TABLET | ORAL | 0 refills | Status: DC

## 2021-09-28 NOTE — Telephone Encounter (Signed)
I have attempted to call the pt but the VM is full and I could not leave a message.  Will need to call back.

## 2021-09-28 NOTE — Telephone Encounter (Signed)
Doxycycline 100 mg, # 8, 2 today then one daily

## 2021-09-28 NOTE — Telephone Encounter (Signed)
I have called the pt and she is aware of CY recs.  Meds have been sent to the pharmacy.  Nothing further is needed.

## 2021-09-28 NOTE — Telephone Encounter (Signed)
I have called and spoke with pt and she stated that on Friday she started with a dry cough.  She stated that this happens every year when the weather starts to change.  She stated that then she started with a cough with some yellow phlegm at times and a runny nose.  She has not used any OTC meds.  She has a pending appt with CY on 11/17 but did not want to be coughing for the next month.  CY please advise on any recs until her next OV.  Thanks  Allergies  Allergen Reactions   Aspirin     wheezing   Latex    Penicillins Hives   Shellfish Allergy

## 2021-10-08 ENCOUNTER — Telehealth: Payer: Self-pay

## 2021-10-08 NOTE — Telephone Encounter (Signed)
Received voicemail from patient asking for her "results from the spring".  Returned pt call and informed patient that no lab orders have been placed or drawn by this office in over 5 years and unable to give results. Pt advised to call her PCP for results. Pt then asked advice for her mom who would like a 2nd opinion from an oncologist. Advised pt to have her mom's oncologist place a referral to this office if asking for a 2nd opinion. Pt appreciative of return phone call and verbalized understanding.

## 2021-10-12 ENCOUNTER — Other Ambulatory Visit: Payer: Self-pay

## 2021-10-12 ENCOUNTER — Ambulatory Visit: Payer: BC Managed Care – PPO | Attending: Internal Medicine

## 2021-10-12 ENCOUNTER — Ambulatory Visit (INDEPENDENT_AMBULATORY_CARE_PROVIDER_SITE_OTHER): Payer: BC Managed Care – PPO | Admitting: Family

## 2021-10-12 VITALS — BP 125/85 | HR 90 | Temp 98.6°F | Resp 16 | Ht 67.0 in | Wt 202.0 lb

## 2021-10-12 DIAGNOSIS — M791 Myalgia, unspecified site: Secondary | ICD-10-CM

## 2021-10-12 DIAGNOSIS — E782 Mixed hyperlipidemia: Secondary | ICD-10-CM

## 2021-10-12 DIAGNOSIS — E119 Type 2 diabetes mellitus without complications: Secondary | ICD-10-CM

## 2021-10-12 DIAGNOSIS — Z23 Encounter for immunization: Secondary | ICD-10-CM

## 2021-10-12 DIAGNOSIS — I10 Essential (primary) hypertension: Secondary | ICD-10-CM | POA: Diagnosis not present

## 2021-10-12 LAB — BASIC METABOLIC PANEL
BUN: 11 mg/dL (ref 6–23)
CO2: 27 mEq/L (ref 19–32)
Calcium: 9.4 mg/dL (ref 8.4–10.5)
Chloride: 106 mEq/L (ref 96–112)
Creatinine, Ser: 0.97 mg/dL (ref 0.40–1.20)
GFR: 66.52 mL/min (ref >60.00)
Glucose, Bld: 85 mg/dL (ref 70–99)
Potassium: 4.1 mEq/L (ref 3.5–5.1)
Sodium: 141 mEq/L (ref 135–145)

## 2021-10-12 LAB — HEMOGLOBIN A1C: Hgb A1c MFr Bld: 6.9 % — ABNORMAL HIGH (ref 4.6–6.5)

## 2021-10-12 NOTE — Progress Notes (Signed)
   Covid-19 Vaccination Clinic  Name:  [name]    MRN: [id number] DOB: April 14, 1966  10/12/2021  Ms. [name] was observed post Covid-19 immunization for 15 minutes without incident. She was provided with Vaccine Information Sheet and instruction to access the V-Safe system.   Ms. [name] was instructed to call 911 with any severe reactions post vaccine: Difficulty breathing  Swelling of face and throat  A fast heartbeat  A bad rash all over body  Dizziness and weakness   Immunizations Administered     Name Date Dose VIS Date Route   Moderna Covid-19 vaccine Bivalent Booster 10/12/2021 12:26 PM 0.5 mL 08/01/2021 Intramuscular   Manufacturer: Moderna   Lot: [id number]   [place]: 17921-783-75

## 2021-10-12 NOTE — Assessment & Plan Note (Signed)
Lab Results  Component Value Date   CHOL 164 03/24/2021   HDL 66.50 03/24/2021   LDLCALC 77 03/24/2021   TRIG 105.0 03/24/2021   CHOLHDL 2 03/24/2021   Stable on atorvastatin.  Continue same.

## 2021-10-12 NOTE — Assessment & Plan Note (Addendum)
Lab Results  Component Value Date   HGBA1C 6.8 (H) 03/24/2021   HGBA1C 6.3 (H) 11/07/2020   HGBA1C 6.5 04/07/2020   Lab Results  Component Value Date   MICROALBUR 3.1 (H) 01/02/2020   LDLCALC 77 03/24/2021   CREATININE 0.84 11/28/2020   Stable on diabetic diet. Continue same.

## 2021-10-12 NOTE — Patient Instructions (Signed)
Please complete lab work prior to leaving.   

## 2021-10-12 NOTE — Progress Notes (Addendum)
Subjective:   By signing my name below, I, [name], attest that this documentation has been prepared under the direction and in the presence of [name]. 10/12/2021    Patient ID: [name], female    DOB: 01-16-1966, 55 y.o.   MRN: [id number]  Chief Complaint  Patient presents with   Hypertension    Here for follow up   Diabetes    Here for follow up    Hypertension  Diabetes  Patient is in today for a office visit.   Muscle pain- She reports her muscle hurts when she is not taking prednisone.  Chest pain- She reports having sporadic chest pain 1-2x a month. She thinks it may be lung pain or gas. Her last cardiac CT scans were normal.  Cholesterol- She continues taking 10 mg Lipitor daily PO and reports no new issues while taking it.   Lab Results  Component Value Date   CHOL 164 03/24/2021   HDL 66.50 03/24/2021   LDLCALC 77 03/24/2021   TRIG 105.0 03/24/2021   CHOLHDL 2 03/24/2021   Blood pressure- She reports she stopped taking 20 mg lasix daily PO PRN since losing weight and reports no new issues. She is taking it when her legs swell.   BP Readings from Last 3 Encounters:  10/12/21 125/85  09/18/21 138/86  07/23/21 130/67   Pulse Readings from Last 3 Encounters:  10/12/21 90  09/18/21 87  07/23/21 (!) 107   GYN- She has a GYN specialist that she regularly sees.  Family medical history- She reports her mother is diagnosed with liver cancer.  Immunizations- She is UTD on flu vaccines. She was informed of the new bivalent Covid-19 vaccine and is interested in getting it. She is UTD on shingrix vaccines.    Health Maintenance Due  Topic Date Due   Hepatitis C Screening  Never done   Pneumococcal Vaccine 94-59 Years old (2 - PCV) 07/16/2016   Zoster Vaccines- Shingrix (2 of 2) 11/06/2019   PAP SMEAR-Modifier  03/03/2021   MAMMOGRAM  04/04/2021   INFLUENZA VACCINE  07/20/2021    Past Medical History:  Diagnosis Date   Allergic  rhinitis, cause unspecified    Allergy    Asthma    Clotting disorder ([place])    DVT 2005   Diabetes type 2, controlled ([name])    pt denies- no meds- Pre DM-    History of DVT (deep vein thrombosis) 2005   Pt reports blood clot below knee ?unsure which leg.   Hyperlipidemia    Nasal polyps    Unspecified essential hypertension     Past Surgical History:  Procedure Laterality Date   COLONOSCOPY     LASIK Bilateral 2008   NASAL SINUS SURGERY  6226   Dr Erik Obey   OVARIAN CYST REMOVAL  02/2016   pt reported-- Dr Philis Pique   POLYPECTOMY     UTERINE FIBROID SURGERY  2005    Family History  Problem Relation Age of Onset   Cancer Mother 53       ?carcinoid tumor (had surgical removal)   Diabetes Mother        borderline   Stomach cancer Mother    Heart disease Father        died 46, from MI, had hx of ETOH abuse and liver disease   Thyroid cancer Sister        (diagnosed at 33) half sister (same dad)    Diabetes type II Brother  Kidney disease Neg Hx    Hypertension Neg Hx    Hyperlipidemia Neg Hx    Colon cancer Neg Hx    Esophageal cancer Neg Hx    Rectal cancer Neg Hx    Colon polyps Neg Hx     Social History   Socioeconomic History   Marital status: Married    Spouse name: Not on file   Number of children: Not on file   Years of education: Not on file   Highest education level: Not on file  Occupational History   Occupation: Freight forwarder [employer]  Tobacco Use   Smoking status: Never   Smokeless tobacco: Never   Tobacco comments:    Never Used Tobacco  Vaping Use   Vaping Use: Never used  Substance and Sexual Activity   Alcohol use: Yes    Alcohol/week: 0.0 standard drinks    Comment: 1 glass of wine per year   Drug use: No   Sexual activity: Yes    Birth control/protection: None  Other Topics Concern   Not on file  Social History Narrative   Married   No children   No pets   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     Social Determinants of Health   Financial Resource Strain: Not on file  Food Insecurity: Not on file  Transportation Needs: Not on file  Physical Activity: Not on file  Stress: Not on file  Social Connections: Not on file  Intimate Partner Violence: Not on file    Outpatient Medications Prior to Visit  Medication Sig Dispense Refill   albuterol (PROVENTIL) (2.5 MG/3ML) 0.083% nebulizer solution Take 2.5 mg by nebulization every 4 (four) hours as needed for wheezing or shortness of breath.     albuterol (VENTOLIN HFA) 108 (90 Base) MCG/ACT inhaler Inhale 2 puffs into the lungs every 6 (six) hours as needed for wheezing or shortness of breath. 8 g 6   atorvastatin (LIPITOR) 10 MG tablet Take 1 tablet (10 mg total) by mouth daily. 90 tablet 1   Benralizumab (FASENRA) 30 MG/ML SOSY Inject 1 mL (30 mg total) as directed every 8 (eight) weeks. 1 mL 2   cetirizine (ZYRTEC) 10 MG tablet Take 10 mg by mouth daily.     Fluticasone-Umeclidin-Vilant (TRELEGY ELLIPTA) 100-62.5-25 MCG/INH AEPB Inhale 1 puff into the lungs daily as needed.     furosemide (LASIX) 20 MG tablet Take 20 mg by mouth daily as needed for edema.     lisinopril (ZESTRIL) 2.5 MG tablet Take 1 tablet (2.5 mg total) by mouth daily. 90 tablet 1   OVER THE COUNTER MEDICATION Take 1 Dose by mouth daily. [place]oli chews- appetite suppre, green veggie, fruit, relaxes x 4 total     Prenatal Vit-Fe Sulfate-FA (PRENATAL VITAMIN PO) Take 1 capsule by mouth daily.     doxycycline (VIBRA-TABS) 100 MG tablet Take 1 tablet (100 mg total) by mouth 2 (two) times daily. 14 tablet 0   doxycycline (VIBRA-TABS) 100 MG tablet Take 2 tablets today then 1 daily until gone 8 tablet 0   predniSONE (DELTASONE) 10 MG tablet Take 1 tablet (10 mg total) by mouth daily with breakfast. (Patient not taking: Reported on 10/12/2021) 200 tablet 0   No facility-administered medications prior to visit.    Allergies   Allergen Reactions   Aspirin     wheezing   Latex  Penicillins Hives   Shellfish Allergy     Review of Systems  Musculoskeletal:  Positive for myalgias.      Objective:    Physical Exam Constitutional:      General: She is not in acute distress.    Appearance: Normal appearance. She is not ill-appearing.  HENT:     Head: Normocephalic and atraumatic.     Right Ear: External ear normal.     Left Ear: External ear normal.  Eyes:     Extraocular Movements: Extraocular movements intact.     Pupils: Pupils are equal, round, and reactive to light.  Cardiovascular:     Rate and Rhythm: Normal rate and regular rhythm.     Heart sounds: Normal heart sounds. No murmur heard.   No gallop.  Pulmonary:     Effort: Pulmonary effort is normal. No respiratory distress.     Breath sounds: Normal breath sounds. No wheezing or rales.  Musculoskeletal:     Right lower leg: 1+ Edema present.     Left lower leg: 1+ Edema present.  Skin:    General: Skin is warm and dry.  Neurological:     Mental Status: She is alert and oriented to person, place, and time.  Psychiatric:        Behavior: Behavior normal.    BP 125/85 (BP Location: Right Arm, Patient Position: Sitting, Cuff Size: Large)   Pulse 90   Temp 98.6 F (37 C) (Oral)   Resp 16   Ht 5\' 7"  (1.702 m)   Wt 202 lb (91.6 kg)   LMP 06/22/2018   SpO2 100%   BMI 31.64 kg/m  Wt Readings from Last 3 Encounters:  10/12/21 202 lb (91.6 kg)  05/25/21 212 lb (96.2 kg)  04/02/21 212 lb (96.2 kg)       Assessment & Plan:   Problem List Items Addressed This Visit       Unprioritized   Myalgia - Primary    New. Will obtain labs as ordered.       Relevant Orders   Rheumatoid Factor (Completed)   ANA   Hyperlipidemia    Lab Results  Component Value Date   CHOL 164 03/24/2021   HDL 66.50 03/24/2021   LDLCALC 77 03/24/2021   TRIG 105.0 03/24/2021   CHOLHDL 2 03/24/2021  Stable on atorvastatin.  Continue same.        Essential hypertension    BP Readings from Last 3 Encounters:  10/12/21 125/85  09/18/21 138/86  07/23/21 130/67  BP stable. Continue lisinopril 2.5mg  once daily.       Diabetes type 2, controlled ([place])    Lab Results  Component Value Date   HGBA1C 6.8 (H) 03/24/2021   HGBA1C 6.3 (H) 11/07/2020   HGBA1C 6.5 04/07/2020   Lab Results  Component Value Date   MICROALBUR 3.1 (H) 01/02/2020   LDLCALC 77 03/24/2021   CREATININE 0.84 11/28/2020  Stable on diabetic diet. Continue same.       Relevant Orders   Hemoglobin A1c (Completed)   Basic metabolic panel (Completed)     No orders of the defined types were placed in this encounter.   I, [name], personally preformed the services described in this documentation.  All medical record entries made by the scribe were at my direction and in my presence.  I have reviewed the chart and discharge instructions (if applicable) and agree that the record reflects my personal performance and is accurate and complete. 10/12/2021  I,Veronica Multimedia programmer as a Education administrator for [employer], [name].,have documented all relevant documentation on the behalf of Nance Pear, [name],as directed by  Nance Pear, [name] while in the presence of Nance Pear, [name].   Nance Pear, [name]

## 2021-10-14 DIAGNOSIS — M791 Myalgia, unspecified site: Secondary | ICD-10-CM | POA: Insufficient documentation

## 2021-10-14 LAB — ANTI-NUCLEAR AB-TITER (ANA TITER)
ANA TITER: 1:160 {titer} — ABNORMAL HIGH
ANA Titer 1: 1:40 {titer} — ABNORMAL HIGH

## 2021-10-14 LAB — ANA: Anti Nuclear Antibody (ANA): POSITIVE — AB

## 2021-10-14 LAB — RHEUMATOID FACTOR: Rheumatoid fact SerPl-aCnc: <14 IU/mL (ref <14)

## 2021-10-14 NOTE — Assessment & Plan Note (Signed)
BP Readings from Last 3 Encounters:  10/12/21 125/85  09/18/21 138/86  07/23/21 130/67   BP stable. Continue lisinopril 2.5mg  once daily.

## 2021-10-14 NOTE — Assessment & Plan Note (Signed)
New. Will obtain labs as ordered.

## 2021-10-15 ENCOUNTER — Telehealth: Payer: Self-pay | Admitting: Family

## 2021-10-15 DIAGNOSIS — R768 Other specified abnormal immunological findings in serum: Secondary | ICD-10-CM

## 2021-10-15 NOTE — Telephone Encounter (Signed)
[name] is at goal but ANA one of her autoimmune tests is elevated. Since she has been having some muscle pain, I would recommend that we refer her to rheumatology (referral pended).

## 2021-10-16 NOTE — Telephone Encounter (Signed)
Patient advised of results, provider's comments and referral. She verbalized understanding and agrees with referral.

## 2021-11-05 ENCOUNTER — Ambulatory Visit: Payer: BC Managed Care – PPO | Admitting: Internal Medicine

## 2021-11-06 ENCOUNTER — Other Ambulatory Visit (HOSPITAL_BASED_OUTPATIENT_CLINIC_OR_DEPARTMENT_OTHER): Payer: Self-pay

## 2021-11-06 ENCOUNTER — Encounter: Payer: Self-pay | Admitting: Internal Medicine

## 2021-11-06 MED ORDER — MODERNA COVID-19 BIVAL BOOSTER 50 MCG/0.5ML IM SUSP
INTRAMUSCULAR | 0 refills | Status: DC
  Filled 2021-11-06: qty 0.5, 1d supply, fill #0

## 2021-11-10 ENCOUNTER — Telehealth: Payer: Self-pay | Admitting: Family

## 2021-11-10 NOTE — Telephone Encounter (Signed)
See mychart.  

## 2021-11-10 NOTE — Telephone Encounter (Signed)
Patient called stating her mom passed away and would like to talk to [facility] to see if there's any type of medication to help her. She did not want to go into more detail and only wished to speak to [facility].

## 2021-11-16 ENCOUNTER — Telehealth: Payer: Self-pay | Admitting: Family

## 2021-11-16 ENCOUNTER — Other Ambulatory Visit: Payer: Self-pay

## 2021-11-16 ENCOUNTER — Ambulatory Visit: Payer: BC Managed Care – PPO | Admitting: Family

## 2021-11-16 VITALS — BP 143/86 | HR 81 | Temp 98.2°F | Resp 16 | Wt 191.0 lb

## 2021-11-16 DIAGNOSIS — F4321 Adjustment disorder with depressed mood: Secondary | ICD-10-CM | POA: Diagnosis not present

## 2021-11-16 DIAGNOSIS — F4320 Adjustment disorder, unspecified: Secondary | ICD-10-CM | POA: Insufficient documentation

## 2021-11-16 MED ORDER — ALPRAZOLAM 0.25 MG PO TABS: 0.2500 mg | ORAL_TABLET | Freq: Two times a day (BID) | ORAL | 0 refills | Status: DC | PRN

## 2021-11-16 NOTE — Progress Notes (Signed)
Patient ID: [name], female    DOB: [birth year], 55 y.o.   MRN: [id number] F followed for moderate persistent asthma with COPD (requiring frequent steroids , Failed Xolair, , hx nasal polyps, allergic rhinitis, hx DVT PFT- 09/09/11- FEV1 1.64/ 54%, FEV1/FVC 0.54, FEF25-75% 0.74/ 22%, insignif response to bronchodilator TLC 0.74, DLCO 72%. Moderate obstructive disease, mild restriction, mild reduction of DLCO. Office spirometry- mild obstructive airways disease. FEV1 1.95/73%, FVC 3.01/93%, FEV1/FVC 0.65, FEF 25-75% 1.26/38%.   -----------------------------------------------------------------   11/06/20- 55 year old female never smoker followed for moderate persistent asthma with COPD (frequent steroids, failed Xolair), history nasal polyps, allergic rhinitis, history DVT Albuterol HFA, neb albuterol/ atrovent, EpiPen, Trelegy,  prednisone 10 mg, Fasenra, Zyrtec,  Covid vax- 3 Moderna Flu vax- had pt states allergies are bothering her runny nose Some post-nasal drip with little sneeze. Asthma well-controlled on Fasenra- does help. No prednisone now in 2-3 months.   11/17/21- 55 year old female never smoker followed for severe persistent Asthma with COPD (frequent steroids, failed Xolair), history nasal Polyps/ [place]/Aspirin Allergy, allergic Rhinitis, history DVT -Albuterol HFA, neb albuterol/ atrovent, EpiPen, Trelegy100,  prednisone 10 mg, Fasenra/6 weeks, Zyrtec, albuterol 2mg  tab BID, Flonase, nasal saline,  Covid vax-  Moderna Flu vax-had Grieving- mother died Saw Dr Redmond Baseman ENT for chronic nasal polyposis- she wtw on repeat surgery.Prednisone and abx in August. He noted she snores and doesn't sleep well. Got Fasenra inj today. We focused on control of her asthma and rhinitis today but will need to look at the issue of snoring and sleep problems at a subsequent visit. Significant wheezing last night and asks for Depo-Medrol as well as a prednisone supply bottle to use  sparingly as she has in the past.  Trelegy was too expensive and she is only using a rescue inhaler.  She does have a nebulizer. CXR 03/10/20- No active cardiopulmonary disease.   Review of Systems-See HPI     + = positive Constitutional:   No weight loss, night sweats,  Fevers, chills, fatigue, lassitude. HEENT:   No headaches,  Difficulty swallowing,  Tooth/dental problems,  Sore throat,                No sneezing, itching, ear ache,, + post nasal drip. + nasal congestion. CV:  No chest pain,  Orthopnea, PND, swelling in lower extremities, anasarca, dizziness, palpitations GI  No heartburn, indigestion, abdominal pain, nausea, vomiting, Resp:Usually some shortness of breath with exertion, not at rest.  No excess mucus,  productive cough,          + non-productive cough,  No coughing up of blood.   change in color of mucus.   +wheezing.   Skin: no rash or lesions. GU: . MS:  No joint pain, + swelling.   Neuro- nothing unusual Psych:  No change in mood or affect. No depression or anxiety.  No memory loss.  Objective:   Physical Exam General- Alert, Oriented, Affect-appropriate, Distress- none acute  Relaxed and conversational   + overweight, + pink cheeks/ steroid facies Skin- rash-none, lesions- none, excoriation- none Lymphadenopathy- none Head- atraumatic            Eyes- Gross vision intact, PERRLA, conjunctivae clear secretions            Ears- Hearing, canals normal            Nose- +Turbinate edema, No-Septal dev, mucus, , erosion, perforation. No definite polyps             Throat-  Mallampati III-IV, mucosa -not red , drainage- none, tonsils- atrophic Neck- flexible , trachea midline, no stridor , thyroid nl, carotid no bruit Chest - symmetrical excursion , unlabored           Heart/CV- RRR , no murmur , no gallop  , no rub, nl s1 s2                           - JVD- none , edema- none , stasis changes- none, varices- none           Lung-  Clear/ diminished, wheeze- none,   unlabored, cough- none , dullness-none, rub- none           Chest wall-  Abd-  Br/ Gen/ Rectal- Not done, not indicated Extrem- cyanosis- none, clubbing, none, atrophy- none, strength- nl.  Neuro- grossly intact to observation

## 2021-11-16 NOTE — Telephone Encounter (Signed)
Records release faxed 

## 2021-11-16 NOTE — Progress Notes (Signed)
Subjective:     Patient ID: Classie Weng, female    DOB: 1966/07/23, 55 y.o.   MRN: [id number]  Chief Complaint  Patient presents with   Rosamaria Lints reaction    Mother passed away 11/18/21    HPI Patient is in today to discuss loss of her mother on 11-18-21. Her mother died from liver cancer. Reports that she is not sleeping well, constantly thinking about her. She plans to start grief counseling on Friday with hospice.  Her last day of work was 11/11.    Health Maintenance Due  Topic Date Due   Hepatitis C Screening  Never done   Pneumococcal Vaccine 60-32 Years old (3 - PCV) 07/16/2016   Zoster Vaccines- Shingrix (2 of 2) 11/06/2019   PAP SMEAR-Modifier  03/03/2021   INFLUENZA VACCINE  07/20/2021   OPHTHALMOLOGY EXAM  11/06/2021    Past Medical History:  Diagnosis Date   Allergic rhinitis, cause unspecified    Allergy    Asthma    Clotting disorder ([place])    DVT 2005   Diabetes type 2, controlled ([place])    pt denies- no meds- Pre DM-    History of DVT (deep vein thrombosis) 2005   Pt reports blood clot below knee ?unsure which leg.   Hyperlipidemia    Nasal polyps    Unspecified essential hypertension     Past Surgical History:  Procedure Laterality Date   COLONOSCOPY     LASIK Bilateral 2008   NASAL SINUS SURGERY  5427   Dr Erik Obey   OVARIAN CYST REMOVAL  02/2016   pt reported-- Dr Philis Pique   POLYPECTOMY     UTERINE FIBROID SURGERY  2005    Family History  Problem Relation Age of Onset   Cancer Mother 26       ?carcinoid tumor (had surgical removal)   Diabetes Mother        borderline   Stomach cancer Mother    Heart disease Father        died 52, from MI, had hx of ETOH abuse and liver disease   Thyroid cancer Sister        (diagnosed at 56) half sister (same dad)    Diabetes type II Brother    Kidney disease Neg Hx    Hypertension Neg Hx    Hyperlipidemia Neg Hx    Colon cancer Neg Hx    Esophageal cancer Neg Hx    Rectal cancer Neg Hx     Colon polyps Neg Hx     Social History   Socioeconomic History   Marital status: Married    Spouse name: Not on file   Number of children: Not on file   Years of education: Not on file   Highest education level: Not on file  Occupational History   Occupation: Freight forwarder [employer]  Tobacco Use   Smoking status: Never   Smokeless tobacco: Never   Tobacco comments:    Never Used Tobacco  Vaping Use   Vaping Use: Never used  Substance and Sexual Activity   Alcohol use: Yes    Alcohol/week: 0.0 standard drinks    Comment: 1 glass of wine per year   Drug use: No   Sexual activity: Yes    Birth control/protection: None  Other Topics Concern   Not on file  Social History Narrative   Married   No children   No pets   Works at [employer] as a Freight forwarder    Enjoys  movies, shopping, 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     Social Determinants of Health   Financial Resource Strain: Not on file  Food Insecurity: Not on file  Transportation Needs: Not on file  Physical Activity: Not on file  Stress: Not on file  Social Connections: Not on file  Intimate Partner Violence: Not on file    Outpatient Medications Prior to Visit  Medication Sig Dispense Refill   albuterol (PROVENTIL) (2.5 MG/3ML) 0.083% nebulizer solution Take 2.5 mg by nebulization every 4 (four) hours as needed for wheezing or shortness of breath.     albuterol (VENTOLIN HFA) 108 (90 Base) MCG/ACT inhaler Inhale 2 puffs into the lungs every 6 (six) hours as needed for wheezing or shortness of breath. 8 g 6   atorvastatin (LIPITOR) 10 MG tablet Take 1 tablet (10 mg total) by mouth daily. 90 tablet 1   Benralizumab (FASENRA) 30 MG/ML SOSY Inject 1 mL (30 mg total) as directed every 8 (eight) weeks. 1 mL 2   cetirizine (ZYRTEC) 10 MG tablet Take 10 mg by mouth daily.     COVID-19 mRNA bivalent vaccine, Moderna, (MODERNA COVID-19 BIVAL BOOSTER) 50 MCG/0.5ML injection Inject into the muscle. 0.5  mL 0   Fluticasone-Umeclidin-Vilant (TRELEGY ELLIPTA) 100-62.5-25 MCG/INH AEPB Inhale 1 puff into the lungs daily as needed.     furosemide (LASIX) 20 MG tablet Take 20 mg by mouth daily as needed for edema.     lisinopril (ZESTRIL) 2.5 MG tablet Take 1 tablet (2.5 mg total) by mouth daily. 90 tablet 1   OVER THE COUNTER MEDICATION Take 1 Dose by mouth daily. [place]oli chews- appetite suppre, green veggie, fruit, relaxes x 4 total     Prenatal Vit-Fe Sulfate-FA (PRENATAL VITAMIN PO) Take 1 capsule by mouth daily.     No facility-administered medications prior to visit.    Allergies  Allergen Reactions   Aspirin     wheezing   Latex    Penicillins Hives   Shellfish Allergy     ROS    See HPI Objective:    Physical Exam Constitutional:      Appearance: Normal appearance.  Neurological:     Mental Status: She is alert.  Psychiatric:        Mood and Affect: Affect is tearful.    BP (!) 143/86 (BP Location: Right Arm, Patient Position: Sitting, Cuff Size: Small)   Pulse 81   Temp 98.2 F (36.8 C) (Oral)   Resp 16   Wt 191 lb (86.6 kg)   LMP 06/22/2018   SpO2 99%   BMI 29.91 kg/m  Wt Readings from Last 3 Encounters:  11/16/21 191 lb (86.6 kg)  10/12/21 202 lb (91.6 kg)  05/25/21 212 lb (96.2 kg)       Assessment & Plan:   Problem List Items Addressed This Visit       Unprioritized   Grief reaction - Primary    Patient appears to be grieving appropriately. She is having some episodes of tearfulness during the day and insomnia. I have given her an rx for xanax 0.25mg  bid prn to use for sleep/anxiety symptoms as needed short term. I encouraged her to follow through with planned grief counseling. 26 minutes spent on today's visit. The majority of this time was spent on counseling.        I am having Floye [name] Sides start on ALPRAZolam. I am also having her maintain her Prenatal Vit-Fe Sulfate-FA (PRENATAL  VITAMIN PO), cetirizine, albuterol, Trelegy Ellipta,  furosemide, OVER THE COUNTER MEDICATION, lisinopril, atorvastatin, albuterol, Fasenra, and Moderna COVID-19 Bival Booster.  Meds ordered this encounter  Medications   ALPRAZolam (XANAX) 0.25 MG tablet    Sig: Take 1 tablet (0.25 mg total) by mouth 2 (two) times daily as needed for anxiety.    Dispense:  30 tablet    Refill:  0    Order Specific Question:   Supervising Provider    Answer:   Penni Homans A [[id number]]

## 2021-11-16 NOTE — Telephone Encounter (Signed)
Please call [facility] for pap results and mammo results.

## 2021-11-17 ENCOUNTER — Ambulatory Visit (INDEPENDENT_AMBULATORY_CARE_PROVIDER_SITE_OTHER): Payer: BC Managed Care – PPO | Admitting: *Deleted

## 2021-11-17 ENCOUNTER — Ambulatory Visit: Payer: BC Managed Care – PPO | Admitting: Family

## 2021-11-17 ENCOUNTER — Encounter: Payer: Self-pay | Admitting: Internal Medicine

## 2021-11-17 ENCOUNTER — Ambulatory Visit (INDEPENDENT_AMBULATORY_CARE_PROVIDER_SITE_OTHER): Payer: BC Managed Care – PPO | Admitting: Internal Medicine

## 2021-11-17 VITALS — BP 117/83 | HR 87 | Temp 97.4°F | Ht 67.0 in | Wt 191.0 lb

## 2021-11-17 VITALS — BP 117/83 | HR 87 | Temp 97.4°F | Resp 20 | Ht 67.0 in | Wt 191.8 lb

## 2021-11-17 DIAGNOSIS — J4550 Severe persistent asthma, uncomplicated: Secondary | ICD-10-CM

## 2021-11-17 DIAGNOSIS — J339 Nasal polyp, unspecified: Secondary | ICD-10-CM | POA: Diagnosis not present

## 2021-11-17 DIAGNOSIS — J4541 Moderate persistent asthma with (acute) exacerbation: Secondary | ICD-10-CM

## 2021-11-17 MED ORDER — METHYLPREDNISOLONE ACETATE 80 MG/ML IJ SUSP
80.0000 mg | Freq: Once | INTRAMUSCULAR | Status: AC
  Administered 2021-11-17: 80 mg via INTRAMUSCULAR

## 2021-11-17 MED ORDER — FAMOTIDINE IN NACL 20-0.9 MG/50ML-% IV SOLN: 20.0000 mg | Freq: Once | INTRAVENOUS | Status: DC | PRN

## 2021-11-17 MED ORDER — METHYLPREDNISOLONE SODIUM SUCC 125 MG IJ SOLR: 125.0000 mg | Freq: Once | INTRAMUSCULAR | Status: DC | PRN

## 2021-11-17 MED ORDER — PREDNISONE 10 MG PO TABS: ORAL_TABLET | ORAL | 0 refills | Status: DC

## 2021-11-17 MED ORDER — BENRALIZUMAB 30 MG/ML ~~LOC~~ SOSY
30.0000 mg | PREFILLED_SYRINGE | Freq: Once | SUBCUTANEOUS | Status: AC
  Administered 2021-11-17: 30 mg via SUBCUTANEOUS
  Filled 2021-11-17: qty 1

## 2021-11-17 MED ORDER — EPINEPHRINE 0.3 MG/0.3ML IJ SOAJ: 0.3000 mg | Freq: Once | INTRAMUSCULAR | Status: DC | PRN

## 2021-11-17 MED ORDER — BUDESONIDE-FORMOTEROL FUMARATE 160-4.5 MCG/ACT IN AERO: INHALATION_SPRAY | RESPIRATORY_TRACT | 6 refills | Status: DC

## 2021-11-17 MED ORDER — SODIUM CHLORIDE 0.9 % IV SOLN: Freq: Once | INTRAVENOUS | Status: DC | PRN

## 2021-11-17 MED ORDER — FLUTICASONE PROPIONATE 50 MCG/ACT NA SUSP: NASAL | 12 refills | Status: DC

## 2021-11-17 MED ORDER — DIPHENHYDRAMINE HCL 50 MG/ML IJ SOLN: 50.0000 mg | Freq: Once | INTRAMUSCULAR | Status: DC | PRN

## 2021-11-17 MED ORDER — ALBUTEROL SULFATE HFA 108 (90 BASE) MCG/ACT IN AERS: 2.0000 | INHALATION_SPRAY | Freq: Once | RESPIRATORY_TRACT | Status: DC | PRN

## 2021-11-17 NOTE — Assessment & Plan Note (Signed)
Patient appears to be grieving appropriately. She is having some episodes of tearfulness during the day and insomnia. I have given her an rx for xanax 0.25mg  bid prn to use for sleep/anxiety symptoms as needed short term. I encouraged her to follow through with planned grief counseling. 26 minutes spent on today's visit. The majority of this time was spent on counseling.

## 2021-11-17 NOTE — Assessment & Plan Note (Signed)
She needs to be on a maintenance control medication and Trelegy was too expensive. Plan-generic Symbicort, prednisone tabs to hold as she has done in the past for very sparing intermittent use.  Change Fasenra  to [place] for better control of asthma and nasal polyps.

## 2021-11-17 NOTE — Assessment & Plan Note (Signed)
Chronic rhinitis with nasal polyps as noted at recent ENT visit with Dr. Redmond Baseman.  She is using Flonase. Plan-change from [place] to [place] seeking better polyp control

## 2021-11-17 NOTE — Patient Instructions (Addendum)
Order-d/c [name]- start Dupixent for sever allergic asthma, nasal polyps  Order- depo 80     severe asthma  Script sent for Symbicort 160   inhale 2 puffs then rinse mouth, twice daily  Script sent for prednisone tablets to hold- use sparingly as needed, as discussed.  Script sent refilling Flonase

## 2021-11-17 NOTE — Progress Notes (Signed)
Diagnosis: Asthma  Provider:  Praveen Mannam, MD  Procedure: Injection  Fasenra (Benralizumab), Dose: 30 mg, Site: subcutaneous  Discharge: Condition: Good, Destination: Home . AVS provided to patient.   Performed by:  Adlean Hardeman A, RN       

## 2021-11-19 ENCOUNTER — Telehealth: Payer: Self-pay

## 2021-11-19 ENCOUNTER — Other Ambulatory Visit (HOSPITAL_COMMUNITY): Payer: Self-pay

## 2021-11-19 NOTE — Telephone Encounter (Signed)
Spoke with patient about Dupixent approval. She has received Fasenra at infusion center but states she thinks that self-administration will be the most cost-effective option for her moving forward.  She received Fasenra on 11/17/21. Generally, we start biologics when the next dose is dose however since Berna Bue is dosed every 8 weeks, her next Berna Bue dose/Dupixent start date would be 01/12/22.  Routing to Dr. Annamaria Boots for advisement if he feels comfortable with patient starting [place] sooner than the end of January 2023  Knox Saliva, PharmD, MPH, BCPS Clinical Pharmacist (Rheumatology and Pulmonology)

## 2021-11-19 NOTE — Telephone Encounter (Signed)
Received New start paperwork for [place]. Will update as we work through the benefits process.  Submitted a Prior Authorization request to [facility] for [place] via CoverMyMeds. Will update once we receive a response.   Key: [id number]

## 2021-11-19 NOTE — Telephone Encounter (Signed)
Received notification from [facility] regarding a prior authorization for [place]. Authorization has been APPROVED from 11/19/2021 to 05/20/2022.   Per test claim, copay for 28 days supply is $4,816.28 when filled at a pharmacy other than [facility]  Patient must fill through either [facility] or [facility].  Authorization # [id number]  Copay card activated for patient: RxBIN: [id number] RxPCN: Loyalty RxGRP: [id number] ID: [id number]  Approval letter and copay card sent to scan center.   Routing to [facility] for f/u.

## 2021-11-20 NOTE — Telephone Encounter (Signed)
Ok to start sooner if we can- arbitrarily maybe a month after last [place]. Holidays may complicate timing for her.

## 2021-11-25 NOTE — Progress Notes (Signed)
Office Visit Note  Patient: [name]             Date of Birth: 01-17-1966           MRN: [id number]             PCP: [name], NP Referring: [name], NP Visit Date: 11/26/2021 Occupation: [employer] manager  Subjective:  New Patient (Initial Visit) (Muscle pain and aching10)   History of Present Illness: [name] is a 55 y.o. female here for evaluation of body and muscle pains with positive ANA. She has  a history of adult onset moderate to severe persistent asthma on long-term treatment also requiring chronic, intermittent steroid exposure. Currently at day 2 of another treatment course. Muscle pains are new problem during approximately the past 1 year.  She notices pain in the upper and lower portions of the legs and feels like it is in the muscle often provoked after prolonged walking and standing all day for work.  Symptoms typically resolve after resting and sleeping improved by the next day.  She does not take any specific medications for this.  She feels there is an improvement with taking a hot shower.  She has morning stiffness lasting a few minutes each day.  She has some chronic swelling in the left leg distally longstanding for years and unremarkable on previous vascular evaluation.  Otherwise does not notice localized swelling in the affected areas.  She denies any problems in her torso and upper extremities.  Symptoms are only occurring once she is off steroids for at least a month or 2 for her asthma and completely improved when she is taking steroids. Besides muscle pains she also notices dry eyes mouth without any specific injury or ulceration.  She has hot flashes and intermittent heat intolerance and is about 3 years into menopausal symptoms.  She has a history of unprovoked blood clot in the right leg in 2010 workup for antiphospholipid antibodies was negative. She had 2 early trimester miscarriages and no completed pregnancies. She denies  any other problems with clots, bleeding, or bruising. She has generalized alopecia without any rashes or itching. She denies lymphadenopathy or raynaud's symptoms.  Labs reviewed 09/2021 ANA 1:160 speckled 1:40 homogenous RF neg BMP wnl  Activities of Daily Living:  Patient reports morning stiffness for 10 minutes.   Patient Denies nocturnal pain.  Difficulty dressing/grooming: Denies Difficulty climbing stairs: Denies Difficulty getting out of chair: Denies Difficulty using hands for taps, buttons, cutlery, and/or writing: Denies  Review of Systems  Constitutional:  Negative for fatigue.  HENT:  Positive for mouth dryness.   Eyes:  Positive for dryness.  Respiratory:  Negative for shortness of breath.   Cardiovascular:  Positive for swelling in legs/feet.  Gastrointestinal:  Negative for constipation.  Endocrine: Positive for heat intolerance.  Genitourinary:  Negative for difficulty urinating.  Musculoskeletal:  Positive for joint pain, joint pain, morning stiffness and muscle tenderness.  Skin:  Negative for rash.  Allergic/Immunologic: Negative for susceptible to infections.  Neurological:  Negative for numbness.  Hematological:  Negative for bruising/bleeding tendency.  Psychiatric/Behavioral:  Negative for sleep disturbance.    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   Grief reaction 11/16/2021   Positive ANA (antinuclear antibody) 10/15/2021   Myalgia 10/14/2021   Chronic pansinusitis 08/26/2021   History of sinus surgery 07/31/2021   Nasal polyps    Hyperlipidemia  Clotting disorder ([place])    Allergy    Severe persistent asthma 03/24/2021   Steroid dependence ([place]) 04/01/2020   Diabetes type 2, controlled ([place])    Chronic venous insufficiency 09/16/2016   Varicose veins of both lower extremities with complications 16/09/9603   Uterine leiomyoma 05/25/2016   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   Rectal bleeding 10/20/2015   Obesity 04/27/2015   Abnormal EKG 03/19/2015   Preventative health care 02/17/2015   OCD (obsessive compulsive disorder) 01/14/2015   Essential hypertension 01/13/2015   Borderline diabetes 01/13/2015   Peripheral edema 06/17/2012   NASAL POLYP 01/01/2008   Seasonal and perennial allergic rhinitis 01/01/2008   Moderate persistent asthma with acute exacerbation in adult 01/01/2008   DEEP VENOUS THROMBOPHLEBITIS, HX OF 01/01/2008   History of DVT (deep vein thrombosis) 2005    Past Medical History:  Diagnosis Date   Allergic rhinitis, cause unspecified    Allergy    Asthma    Clotting disorder ([place])    DVT 2005   Diabetes type 2, controlled ([place])    pt denies- no meds- Pre DM-    History of DVT (deep vein thrombosis) 2005   Pt reports blood clot below knee ?unsure which leg.   Hyperlipidemia    Nasal polyps    Unspecified essential hypertension     Family History  Problem Relation Age of Onset   Cancer Mother 55       ?carcinoid tumor (had surgical removal)   Diabetes Mother        borderline   Stomach cancer Mother    Liver cancer Mother    Heart disease Father        died 76, from MI, had hx of ETOH abuse and liver disease   Thyroid cancer Sister        (diagnosed at 75) half sister (same dad)    Diabetes type II Brother    Kidney disease Neg Hx    Hypertension Neg Hx    Hyperlipidemia Neg Hx    Colon cancer Neg Hx    Esophageal cancer Neg Hx    Rectal cancer Neg Hx    Colon polyps Neg Hx    Past Surgical History:  Procedure Laterality Date   COLONOSCOPY     LASIK Bilateral 2008   NASAL SINUS SURGERY  5409   Dr Erik Obey   OVARIAN CYST REMOVAL  02/2016   pt reported-- Dr Philis Pique   POLYPECTOMY     UTERINE FIBROID SURGERY  2005   Social History   Social History Narrative   Married   No children   No pets   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     Immunization History  Administered Date(s) Administered   Hepatitis A, Adult 09/11/2019   Hepatitis B 07/17/2015, 08/21/2015, 03/04/2016   Influenza Split 08/20/2011, 09/18/2012, 08/29/2014, 08/30/2015   Influenza Whole 08/20/2010   Influenza, Quadrivalent, Recombinant, Inj, Pf 08/30/2019   Influenza,inj,Quad PF,6+ Mos 09/26/2013, 09/14/2016, 10/10/2017, 09/07/2018, 09/02/2021   Influenza-Unspecified 08/21/2015, 09/19/2016, 09/02/2020   Moderna Covid-19 Vaccine Bivalent Booster 71yrs & up 10/12/2021   Moderna Sars-Covid-2 Vaccination 02/26/2020, 03/29/2020, 10/31/2020   Pneumococcal Polysaccharide-23 09/19/2009, 08/20/2011, 07/17/2015   Tdap 02/17/2015   Zoster Recombinat (Shingrix) 09/11/2019     Objective: Vital Signs: BP 113/76 (BP Location: Right Arm, Patient Position: Sitting, Cuff Size: Normal)   Pulse 62   Resp  16   Ht 5\' 6"  (1.676 m)   Wt 192 lb (87.1 kg)   LMP 06/22/2018   BMI 30.99 kg/m    Physical Exam HENT:     Right Ear: External ear normal.     Left Ear: External ear normal.     Mouth/Throat:     Mouth: Mucous membranes are moist.     Pharynx: Oropharynx is clear.  Eyes:     Conjunctiva/sclera: Conjunctivae normal.  Cardiovascular:     Rate and Rhythm: Normal rate and regular rhythm.  Pulmonary:     Effort: Pulmonary effort is normal.     Breath sounds: Normal breath sounds.  Musculoskeletal:     Comments: Left ankle swelling compared to right, nonpitting  Skin:    General: Skin is warm and dry.     Findings: No rash.     Comments: Normal nailfold capillaroscopy appearance  Neurological:     Mental Status: She is alert.     Deep Tendon Reflexes: Reflexes normal.  Psychiatric:        Mood and Affect: Mood normal.     Musculoskeletal Exam:  Neck full ROM no tenderness Shoulders full ROM no tenderness or swelling Elbows full ROM no tenderness or swelling Wrists full ROM no tenderness or swelling Fingers full ROM no  tenderness or swelling Knees full ROM no tenderness or swelling Ankles full ROM no tenderness or swelling   No additional findings.  Imaging: No results found.  Recent Labs: Lab Results  Component Value Date   WBC 6.2 04/07/2020   HGB 13.4 04/07/2020   PLT 270.0 04/07/2020   NA 141 10/12/2021   K 4.1 10/12/2021   CL 106 10/12/2021   CO2 27 10/12/2021   GLUCOSE 85 10/12/2021   BUN 11 10/12/2021   CREATININE 0.97 10/12/2021   BILITOT 0.4 04/07/2020   ALKPHOS 119 (H) 04/07/2020   AST 16 04/07/2020   ALT 18 04/07/2020   PROT 6.3 04/07/2020   ALBUMIN 3.7 04/07/2020   CALCIUM 9.4 10/12/2021    Speciality Comments: No specialty comments available.  Procedures:  No procedures performed Allergies: Aspirin, Latex, Penicillins, Shellfish allergy, and Shellfish-derived products   Assessment / Plan:     Visit Diagnoses: Positive ANA (antinuclear antibody) - Plan: RNP Antibody, Anti-Smith antibody, Sjogrens syndrome-A extractable nuclear antibody, Anti-DNA antibody, double-stranded, C3 and C4, Anti-scleroderma antibody  Positive ANA several systemic symptoms but relatively nonspecific.  Nothing on exam today although she is on current prednisone treatment for asthma may be masking findings.  We will check more specific antibody markers and serum complement. Would definitely follow up if abnormal although may not add additional medications currently. If work-up today is negative I do not think we would need additional testing scheduled but can follow-up as needed to look if there is any return of symptoms when off treatment again.  Myalgia  Bilateral leg pains more associated with prolonged use there is no weakness loss of sensation or abnormal reflexes on exam.  No current indication for myositis or dystrophy. Getting laboratory work-up as above for today.  Peripheral edema  Chronic mild nonpitting edema in the left lower extremity apparently benign past vascular evaluation I do  not see any significant inflammatory changes at this time.  Steroid dependence (H[facility])  Chronic intermittent steroid exposure could be associated with some secondary adrenal insufficiency which can cause myalgia but not currently showing other signs or complications of this.  Orders: Orders Placed This Encounter  Procedures   RNP  Antibody   Anti-Smith antibody   Sjogrens syndrome-A extractable nuclear antibody   Anti-DNA antibody, double-stranded   C3 and C4   Anti-scleroderma antibody    No orders of the defined types were placed in this encounter.    Follow-Up Instructions: No follow-ups on file.   Collier Salina, MD  Note - This record has been created using [employer].  Chart creation errors have been sought, but may not always  have been located. Such creation errors do not reflect on  the standard of medical care.

## 2021-11-26 ENCOUNTER — Encounter: Payer: Self-pay | Admitting: Internal Medicine

## 2021-11-26 ENCOUNTER — Other Ambulatory Visit: Payer: Self-pay

## 2021-11-26 ENCOUNTER — Ambulatory Visit (INDEPENDENT_AMBULATORY_CARE_PROVIDER_SITE_OTHER): Payer: BC Managed Care – PPO | Admitting: Internal Medicine

## 2021-11-26 VITALS — BP 113/76 | HR 62 | Resp 16 | Ht 66.0 in | Wt 192.0 lb

## 2021-11-26 DIAGNOSIS — R768 Other specified abnormal immunological findings in serum: Secondary | ICD-10-CM

## 2021-11-26 DIAGNOSIS — F1920 Other psychoactive substance dependence, uncomplicated: Secondary | ICD-10-CM | POA: Diagnosis not present

## 2021-11-26 DIAGNOSIS — M791 Myalgia, unspecified site: Secondary | ICD-10-CM

## 2021-11-26 DIAGNOSIS — R609 Edema, unspecified: Secondary | ICD-10-CM

## 2021-11-26 DIAGNOSIS — L821 Other seborrheic keratosis: Secondary | ICD-10-CM | POA: Insufficient documentation

## 2021-11-27 ENCOUNTER — Encounter: Payer: Self-pay | Admitting: Internal Medicine

## 2021-11-27 LAB — SJOGRENS SYNDROME-A EXTRACTABLE NUCLEAR ANTIBODY: SSA (Ro) (ENA) Antibody, IgG: <1 AI

## 2021-11-27 LAB — C3 AND C4
C3 Complement: 127 mg/dL (ref 83–193)
C4 Complement: 35 mg/dL (ref 15–57)

## 2021-11-27 LAB — ANTI-DNA ANTIBODY, DOUBLE-STRANDED: ds DNA Ab: <1 IU/mL

## 2021-11-27 LAB — ANTI-SCLERODERMA ANTIBODY: Scleroderma (Scl-70) (ENA) Antibody, IgG: <1 AI

## 2021-11-27 LAB — ANTI-SMITH ANTIBODY: ENA SM Ab Ser-aCnc: <1 AI

## 2021-11-27 LAB — RNP ANTIBODY: Ribonucleic Protein(ENA) Antibody, IgG: <1 AI

## 2021-11-30 NOTE — Progress Notes (Signed)
Lab results are negative for test to confirming any disease like lupus so I am not very worried about her positive ANA test. I don't recommend any new medications at this time and no scheduled f/u needed. I think we can follow up as needed when she is off steroids if symptoms return or get worse to take a look at that time she can call or message.

## 2021-11-30 NOTE — Telephone Encounter (Signed)
Addressed results in separate result note today copied here for reference:  Lab results are negative to confirm any disease like lupus so I am not very worried about her positive ANA test. I don't recommend any new medications at this time and no scheduled f/u needed. I think we can follow up as needed when she is off steroids if symptoms return or get worse to take a look-at that time she can call or message.

## 2021-12-04 NOTE — Telephone Encounter (Signed)
Left VM for patient requesting return call to review starting [place]. Will continue to f/u  Knox Saliva, PharmD, MPH, BCPS Clinical Pharmacist (Rheumatology and Pulmonology)

## 2021-12-08 DIAGNOSIS — D259 Leiomyoma of uterus, unspecified: Secondary | ICD-10-CM | POA: Diagnosis not present

## 2021-12-09 ENCOUNTER — Other Ambulatory Visit: Payer: Self-pay | Admitting: Family

## 2021-12-22 ENCOUNTER — Other Ambulatory Visit: Payer: Self-pay | Admitting: Pharmacy Technician

## 2021-12-25 ENCOUNTER — Other Ambulatory Visit (HOSPITAL_COMMUNITY): Payer: Self-pay

## 2021-12-25 ENCOUNTER — Telehealth: Payer: Self-pay | Admitting: Pharmacist

## 2021-12-25 NOTE — Telephone Encounter (Signed)
Patient states copay for Symbicort is too expensive.  Could you please run test claims for: Symbicort Advair Diskus Advair HFA Airduo [place]  She is commercially insured so would be eligible for copay card, as long as it's available (since Symbicort doesn't have copay card any longer, she would have to pay whatever insurance offers her. She is interested in other options that may have copay assistance).  Knox Saliva, PharmD, MPH, BCPS Clinical Pharmacist (Rheumatology and Pulmonology)

## 2021-12-25 NOTE — Telephone Encounter (Signed)
Called patient to schedule Dupixent new start. We were unable to coordinate a time d/t her work schedule. She is aware of dosing schedule of every 14 days.  She will return call to me next week with availability. She has my direct number to reach out  Knox Saliva, PharmD, MPH, BCPS Clinical Pharmacist (Rheumatology and Pulmonology)

## 2021-12-28 NOTE — Telephone Encounter (Signed)
[employer] covers ma $90 per fill - final copay would be $210  Breo Ellipta has no copay card Advair HFA has no copay card  There is asthma grant currently open but will need patient's household size and income to enroll. ATC patient but unable to reach - VM is full  Sent My Chart message to advise  Knox Saliva, PharmD, MPH, BCPS Clinical Pharmacist (Rheumatology and Pulmonology)

## 2022-01-01 ENCOUNTER — Telehealth: Payer: Self-pay | Admitting: Family

## 2022-01-01 ENCOUNTER — Telehealth: Payer: Self-pay | Admitting: Pharmacy Technician

## 2022-01-01 ENCOUNTER — Ambulatory Visit (INDEPENDENT_AMBULATORY_CARE_PROVIDER_SITE_OTHER): Payer: BC Managed Care – PPO | Admitting: Family

## 2022-01-01 VITALS — BP 131/77 | HR 98 | Temp 98.1°F | Resp 16 | Wt 195.0 lb

## 2022-01-01 DIAGNOSIS — E782 Mixed hyperlipidemia: Secondary | ICD-10-CM

## 2022-01-01 DIAGNOSIS — F4321 Adjustment disorder with depressed mood: Secondary | ICD-10-CM

## 2022-01-01 DIAGNOSIS — E119 Type 2 diabetes mellitus without complications: Secondary | ICD-10-CM | POA: Diagnosis not present

## 2022-01-01 DIAGNOSIS — I10 Essential (primary) hypertension: Secondary | ICD-10-CM | POA: Diagnosis not present

## 2022-01-01 NOTE — Progress Notes (Signed)
Subjective:     Patient ID: [name], female    DOB: August 22, 1966, 56 y.o.   MRN: [id number]  Chief Complaint  Patient presents with   Grief reaction    Here for follow up, ran out of Xanax 2 weeks ago   Diabetes    Here for follow up   Hypertension    Here for follow up    HPI Patient is in today for follow up.  Last visit she was actively grieving the loss of her mother.  We gave her a short term rx of xanax 0.69m prn. She states   Reports some abdominal pressure/discomfort. Reports that her BM's are daily. Soft.      DM2- maintained on DM diet. Lab Results  Component Value Date   HGBA1C 6.9 (H) 10/12/2021   HGBA1C 6.8 (H) 03/24/2021   HGBA1C 6.3 (H) 11/07/2020   Lab Results  Component Value Date   MICROALBUR 3.1 (H) 01/02/2020   LDLCALC 77 03/24/2021   CREATININE 0.97 10/12/2021   HTN- maintained on lisinopril 2.59m   BP Readings from Last 3 Encounters:  01/01/22 131/77  11/26/21 113/76  11/17/21 117/83   Grief reaction- she is doing better overall.  Still tearful at times but feels that counseling is helping her a lot. No longer needing xanax. Sleeping better.    Health Maintenance Due  Topic Date Due   Hepatitis C Screening  Never done   Pneumococcal Vaccine 1918456ears old (3 - PCV) 07/16/2016   Zoster Vaccines- Shingrix (2 of 2) 11/06/2019   PAP SMEAR-Modifier  03/03/2021   OPHTHALMOLOGY EXAM  11/06/2021    Past Medical History:  Diagnosis Date   Allergic rhinitis, cause unspecified    Allergy    Asthma    Clotting disorder ([place]   DVT 2005   Diabetes type 2, controlled ([place]   pt denies- no meds- Pre DM-    History of DVT (deep vein thrombosis) 2005   Pt reports blood clot below knee ?unsure which leg.   Hyperlipidemia    Nasal polyps    Unspecified essential hypertension     Past Surgical History:  Procedure Laterality Date   COLONOSCOPY     LASIK Bilateral 2008   NASAL SINUS SURGERY  202263 Dr WoErik Obey OVARIAN CYST  REMOVAL  02/2016   pt reported-- Dr HoPhilis Pique POLYPECTOMY     UTERINE FIBROID SURGERY  2005    Family History  Problem Relation Age of Onset   Cancer Mother 6730     ?carcinoid tumor (had surgical removal)   Diabetes Mother        borderline   Stomach cancer Mother    Liver cancer Mother    Heart disease Father        died 4176from MI, had hx of ETOH abuse and liver disease   Thyroid cancer Sister        (diagnosed at 4864half sister (same dad)    Diabetes type II Brother    Kidney disease Neg Hx    Hypertension Neg Hx    Hyperlipidemia Neg Hx    Colon cancer Neg Hx    Esophageal cancer Neg Hx    Rectal cancer Neg Hx    Colon polyps Neg Hx     Social History   Socioeconomic History   Marital status: Married    Spouse name: Not on file   Number of children: Not on  file   Years of education: Not on file   Highest education level: Not on file  Occupational History   Occupation: Freight forwarder [employer]  Tobacco Use   Smoking status: Never   Smokeless tobacco: Never  Vaping Use   Vaping Use: Never used  Substance and Sexual Activity   Alcohol use: Yes    Alcohol/week: 0.0 standard drinks    Comment: 1 glass of wine per year   Drug use: No   Sexual activity: Yes    Birth control/protection: None  Other Topics Concern   Not on file  Social History Narrative   Married   No children   No pets   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     Social Determinants of Health   Financial Resource Strain: Not on file  Food Insecurity: Not on file  Transportation Needs: Not on file  Physical Activity: Not on file  Stress: Not on file  Social Connections: Not on file  Intimate Partner Violence: Not on file    Outpatient Medications Prior to Visit  Medication Sig Dispense Refill   albuterol (PROVENTIL) (2.5 MG/3ML) 0.083% nebulizer solution Take 2.5 mg by nebulization every 4 (four)  hours as needed for wheezing or shortness of breath.     albuterol (VENTOLIN HFA) 108 (90 Base) MCG/ACT inhaler Inhale 2 puffs into the lungs every 6 (six) hours as needed for wheezing or shortness of breath. 8 g 6   atorvastatin (LIPITOR) 10 MG tablet Take 1 tablet by mouth once daily 90 tablet 0   Benralizumab (FASENRA) 30 MG/ML SOSY Inject 1 mL (30 mg total) as directed every 8 (eight) weeks. 1 mL 2   cetirizine (ZYRTEC) 10 MG tablet Take 10 mg by mouth daily.     COVID-19 mRNA bivalent vaccine, Moderna, (MODERNA COVID-19 BIVAL BOOSTER) 50 MCG/0.5ML injection Inject into the muscle. 0.5 mL 0   fluticasone (FLONASE) 50 MCG/ACT nasal spray 2 sprays each nostril daily 16 g 12   furosemide (LASIX) 20 MG tablet Take 20 mg by mouth daily as needed for edema.     lisinopril (ZESTRIL) 2.5 MG tablet Take 1 tablet by mouth once daily 90 tablet 0   OVER THE COUNTER MEDICATION Take 1 Dose by mouth daily. [place] chews- appetite suppre, green veggie, fruit, relaxes x 4 total     predniSONE (DELTASONE) 10 MG tablet Use sparingly as directed 200 tablet 0   Prenatal Vit-Fe Sulfate-FA (PRENATAL VITAMIN PO) Take 1 capsule by mouth daily.     ALPRAZolam (XANAX) 0.25 MG tablet Take 1 tablet (0.25 mg total) by mouth 2 (two) times daily as needed for anxiety. (Patient not taking: Reported on 01/01/2022) 30 tablet 0   budesonide-formoterol (SYMBICORT) 160-4.5 MCG/ACT inhaler Inhale 2 puffs then rinse mouth, twice daily (Patient not taking: Reported on 11/26/2021) 1 each 6   No facility-administered medications prior to visit.    Allergies  Allergen Reactions   Aspirin     wheezing   Latex Hives   Penicillins Hives   Shellfish Allergy    Shellfish-Derived Products Other (See Comments)    ROS     Objective:    Physical Exam Constitutional:      General: She is not in acute distress.    Appearance: Normal appearance. She is well-developed.  HENT:     Head: Normocephalic and atraumatic.     Right Ear:  External ear normal.     Left Ear: External ear normal.  Eyes:     General: No scleral icterus. Neck:     Thyroid: No thyromegaly.  Cardiovascular:     Rate and Rhythm: Normal rate and regular rhythm.     Heart sounds: Normal heart sounds. No murmur heard. Pulmonary:     Effort: Pulmonary effort is normal. No respiratory distress.     Breath sounds: Normal breath sounds. No wheezing.  Musculoskeletal:     Cervical back: Neck supple.  Skin:    General: Skin is warm and dry.  Neurological:     Mental Status: She is alert and oriented to person, place, and time.  Psychiatric:        Mood and Affect: Mood normal.        Behavior: Behavior normal.        Thought Content: Thought content normal.        Judgment: Judgment normal.    BP 131/77 (BP Location: Right Arm, Patient Position: Sitting, Cuff Size: Small)    Pulse 98    Temp 98.1 F (36.7 C) (Oral)    Resp 16    Wt 195 lb (88.5 kg)    LMP 06/22/2018    SpO2 100%    BMI 31.47 kg/m  Wt Readings from Last 3 Encounters:  01/01/22 195 lb (88.5 kg)  11/26/21 192 lb (87.1 kg)  11/17/21 191 lb (86.6 kg)       Assessment & Plan:   Problem List Items Addressed This Visit       Unprioritized   Hyperlipidemia - Primary   Relevant Orders   Comp Met (CMET)   Lipid panel   Grief reaction    Stable, improving. Continue counseling.  Recommended melatonin prn sleep.       Essential hypertension    BP stable. Continue lisinopril for bp and renal protection.      Diabetes type 2, controlled ([place])    Stable she will return for A1C when due.       Relevant Orders   Comp Met (CMET)   Hemoglobin A1c    I have discontinued Miko [name] Sides's ALPRAZolam and budesonide-formoterol. I am also having her maintain her Prenatal Vit-Fe Sulfate-FA (PRENATAL VITAMIN PO), cetirizine, albuterol, furosemide, OVER THE COUNTER MEDICATION, albuterol, Fasenra, Moderna COVID-19 Bival Booster, predniSONE, fluticasone, atorvastatin, and  lisinopril.  No orders of the defined types were placed in this encounter.

## 2022-01-01 NOTE — Telephone Encounter (Signed)
Please request copy of last pap from Dr. Malachi Carl office.

## 2022-01-01 NOTE — Telephone Encounter (Signed)
Devki,  Patient called [facility] in regards to training for self administration of dupixent. Patient states due to work schedule she would like to been seen Friday 01/08/22 if possible.  Kim.

## 2022-01-04 NOTE — Assessment & Plan Note (Signed)
Stable she will return for A1C when due.

## 2022-01-04 NOTE — Telephone Encounter (Signed)
Records release faxed to central [facility] obgyn

## 2022-01-04 NOTE — Assessment & Plan Note (Signed)
BP stable. Continue lisinopril for bp and renal protection.

## 2022-01-04 NOTE — Assessment & Plan Note (Signed)
Stable, improving. Continue counseling.  Recommended melatonin prn sleep.

## 2022-01-04 NOTE — Telephone Encounter (Signed)
Patient scheduled for Dupixent new start on 01/08/21 with pharmacy clinic. Cancelled appt at infusion center  Knox Saliva, PharmD, MPH, BCPS Clinical Pharmacist (Rheumatology and Pulmonology)

## 2022-01-05 NOTE — Telephone Encounter (Signed)
Fyi note: Patient has transitioned to self admin. (Dupixent). All FYI flags and excell sheet has been updated.

## 2022-01-08 ENCOUNTER — Other Ambulatory Visit (HOSPITAL_COMMUNITY): Payer: Self-pay

## 2022-01-08 ENCOUNTER — Ambulatory Visit (INDEPENDENT_AMBULATORY_CARE_PROVIDER_SITE_OTHER): Payer: BC Managed Care – PPO | Admitting: Pharmacist

## 2022-01-08 ENCOUNTER — Other Ambulatory Visit: Payer: Self-pay

## 2022-01-08 ENCOUNTER — Encounter: Payer: Self-pay | Admitting: Internal Medicine

## 2022-01-08 DIAGNOSIS — J4541 Moderate persistent asthma with (acute) exacerbation: Secondary | ICD-10-CM | POA: Diagnosis not present

## 2022-01-08 DIAGNOSIS — Z7189 Other specified counseling: Secondary | ICD-10-CM

## 2022-01-08 DIAGNOSIS — J339 Nasal polyp, unspecified: Secondary | ICD-10-CM

## 2022-01-08 DIAGNOSIS — Z79899 Other long term (current) drug therapy: Secondary | ICD-10-CM

## 2022-01-08 DIAGNOSIS — J4550 Severe persistent asthma, uncomplicated: Secondary | ICD-10-CM | POA: Diagnosis not present

## 2022-01-08 MED ORDER — TRELEGY ELLIPTA 100-62.5-25 MCG/ACT IN AEPB: 1.0000 | INHALATION_SPRAY | Freq: Every day | RESPIRATORY_TRACT | 5 refills | Status: DC

## 2022-01-08 MED ORDER — DUPIXENT 300 MG/2ML ~~LOC~~ SOAJ: 300.0000 mg | SUBCUTANEOUS | 5 refills | Status: DC

## 2022-01-08 NOTE — Patient Instructions (Signed)
Your next [place] dose is due on 01/22/22, 02/05/22, and every 14 days thereafter. Prescription sent to [facility].  START [facility]LE[facility]. Prescripiton sent to [place] card: BIN: [id number] PCN: [id number] Group: [id number] ID: [id number]  Your copay should be affordable. Please double-check that they are billing through your copay card as secondary coverage. That [place] copay card information is: RxBIN: [id number] RxPCN: [place] RxGRP: [id number] ID: [id number]  You will need to be seen by your provider in 3 to 4 months to assess how [place] is working for you. Please ensure you have a follow-up appointment scheduled in April or May 2023. Call our clinic if you need to make this appointment.  How to manage an injection site reaction: Remember the 5 C's: COUNTER - leave on the counter at least 30 minutes but up to overnight to bring medication to room temperature. This may help prevent stinging COLD - place something cold (like an ice gel pack or cold water bottle) on the injection site just before cleansing with alcohol. This may help reduce pain CLARITIN - use Claritin (generic name is loratadine) for the first two weeks of treatment or the day of, the day before, and the day after injecting. This will help to minimize injection site reactions CORTISONE CREAM - apply if injection site is irritated and itching CALL ME - if injection site reaction is bigger than the size of your fist, looks infected, blisters, or if you develop hives

## 2022-01-08 NOTE — Progress Notes (Signed)
HPI Patient presents today to [facility] to see pharmacy team for [place] new start.  Past medical history includes severe persistent asthma, seasonal and perennial allergic rhinitis, nasal polyps, chronic pansinusitis (history of sinus surgery), HTN, chronic venous insufficiency, T2DM, OCD, history of DVT.   She states she knows that she needs to take care of herself more - she is grieving her mother's recent passing from two months ago. She states she was close to her mother and her passing has been emotional.  She has PRN prednisone rx that she takes with asthma flare-ups. At this time, she is not taking maintenance inhaler due to cost concerns.  Respiratory Medications Current regimen: albuterol PRN treatments Patient reports adherence challenges - cost  OBJECTIVE Allergies  Allergen Reactions   Aspirin     wheezing   Latex Hives   Penicillins Hives   Shellfish Allergy    Shellfish-Derived Products Other (See Comments)    Outpatient Encounter Medications as of 01/08/2022  Medication Sig   Dupilumab (DUPIXENT) 300 MG/2ML SOPN Inject 300 mg into the skin every 14 (fourteen) days.   Fluticasone-Umeclidin-Vilant (TRELEGY ELLIPTA) 100-62.5-25 MCG/ACT AEPB Inhale 1 puff into the lungs daily.   albuterol (PROVENTIL) (2.5 MG/3ML) 0.083% nebulizer solution Take 2.5 mg by nebulization every 4 (four) hours as needed for wheezing or shortness of breath.   albuterol (VENTOLIN HFA) 108 (90 Base) MCG/ACT inhaler Inhale 2 puffs into the lungs every 6 (six) hours as needed for wheezing or shortness of breath.   atorvastatin (LIPITOR) 10 MG tablet Take 1 tablet by mouth once daily   cetirizine (ZYRTEC) 10 MG tablet Take 10 mg by mouth daily.   COVID-19 mRNA bivalent vaccine, Moderna, (MODERNA COVID-19 BIVAL BOOSTER) 50 MCG/0.5ML injection Inject into the muscle.   fluticasone (FLONASE) 50 MCG/ACT nasal spray 2 sprays each nostril daily   furosemide (LASIX) 20 MG tablet Take 20 mg by  mouth daily as needed for edema.   lisinopril (ZESTRIL) 2.5 MG tablet Take 1 tablet by mouth once daily   OVER THE COUNTER MEDICATION Take 1 Dose by mouth daily. Goli chews- appetite suppre, green veggie, fruit, relaxes x 4 total   predniSONE (DELTASONE) 10 MG tablet Use sparingly as directed   Prenatal Vit-Fe Sulfate-FA (PRENATAL VITAMIN PO) Take 1 capsule by mouth daily.   [DISCONTINUED] Benralizumab (FASENRA) 30 MG/ML SOSY Inject 1 mL (30 mg total) as directed every 8 (eight) weeks.   No facility-administered encounter medications on file as of 01/08/2022.     Immunization History  Administered Date(s) Administered   Hepatitis A, Adult 09/11/2019   Hepatitis B 07/17/2015, 08/21/2015, 03/04/2016   Influenza Split 08/20/2011, 09/18/2012, 08/29/2014, 08/30/2015   Influenza Whole 08/20/2010   Influenza, Quadrivalent, Recombinant, Inj, Pf 08/30/2019   Influenza,inj,Quad PF,6+ Mos 09/26/2013, 09/14/2016, 10/10/2017, 09/07/2018, 09/02/2021   Influenza-Unspecified 08/21/2015, 09/19/2016, 09/02/2020   Moderna Covid-19 Vaccine Bivalent Booster 51yrs & up 10/12/2021   Moderna Sars-Covid-2 Vaccination 02/26/2020, 03/29/2020, 10/31/2020   Pneumococcal Polysaccharide-23 09/19/2009, 08/20/2011, 07/17/2015   Tdap 02/17/2015   Zoster Recombinat (Shingrix) 09/11/2019     PFTs No flowsheet data found.   Eosinophils Most recent blood eosinophil count was 0 cells/microL taken on 04/07/20, 800 on 07/12/18,  700 on 07/13/17.   IgE: 34.6 on 10/07/2010   Assessment   Biologics training for dupilumab (Dupixent)  Goals of therapy: Mechanism: human monoclonal IgG4 antibody that inhibits interleukin-4 and interleukin-13 cytokine-induced responses, including release of proinflammatory cytokines, chemokines, and IgE Reviewed that Dupixent is add-on medication and  patient must continue maintenance inhaler regimen. Response to therapy: may take 4 months to determine efficacy. Discussed that patients  generally feel improvement sooner than 4 months.  Side effects: injection site reaction (6-18%), antibody development (5-16%), ophthalmic conjunctivitis (2-16%), transient blood eosinophilia (1-2%)  Dose: 600mg  at Week 0 (administered today in clinic) followed by 300mg  every 14 days thereafter  Administration/Storage:  Reviewed administration sites of thigh or abdomen (at least 2-3 inches away from abdomen). Reviewed the upper arm is only appropriate if caregiver is administering injection  Do not shake pen/syringe as this could lead to product foaming or precipitation. Do not use if solution is discolored or contains particulate matter or if window on prefilled pen is yellow (indicates pen has been used).  Reviewed storage of medication in refrigerator. Reviewed that Daisetta can be stored at room temperature in unopened carton for up to 14 days.  Access: Approval of Dupixent through: insurance Patient enrolled into copay card program to help with copay assistance.   Patient self administered Dupixent in right thigh and right lower abdomen using sample. Dupixent 300mg /45mL autoinjector pens x 2 pens NDC: [phone] Lot:  [id number] Exp: 10/19/2022  Medication Reconciliation  A drug regimen assessment was performed, including review of allergies, interactions, disease-state management, dosing and immunization history. Medications were reviewed with the patient, including name, instructions, indication, goals of therapy, potential side effects, importance of adherence, and safe use.  Drug interaction(s): none noted  Removed Fasenra from med list today  Immunizations  Patient has received 4 COVID19 vaccines. Patient is UTD on influenza, pneumonia vaccine. She has received on zoster dose. Should receive second dose  PLAN Continue Dupixent 300mg  every 14 days.  Next dose is due 01/22/22 and every 14 days thereafter. Rx sent to:  [facility] .  Patient provided with pharmacy phone  number and advised to call later this week to schedule shipment to home. Patient provided with copay card information to provide to pharmacy if quoted copay exceeds $5 per month. START maintenance asthma regimen of: Trelegy 100/62.5/25 mcg (1 puff once daily). Patient enrolled into copay card and provided with copay card information to communicate to pharmacy  All questions encouraged and answered.  Instructed patient to reach out with any further questions or concerns.  Thank you for allowing pharmacy to participate in this patient's care.  This appointment required 60 minutes of patient care (this includes precharting, chart review, review of results, face-to-face care, etc.).   Knox Saliva, PharmD, MPH, BCPS Clinical Pharmacist (Rheumatology and Pulmonology)

## 2022-01-12 ENCOUNTER — Ambulatory Visit: Payer: BC Managed Care – PPO

## 2022-01-15 ENCOUNTER — Ambulatory Visit: Payer: BC Managed Care – PPO | Admitting: Family

## 2022-01-17 ENCOUNTER — Other Ambulatory Visit: Payer: Self-pay | Admitting: Pharmacy Technician

## 2022-01-18 ENCOUNTER — Telehealth: Payer: Self-pay | Admitting: Internal Medicine

## 2022-01-18 NOTE — Telephone Encounter (Signed)
Despite her vaccination history, she should get Covid tested and let us know result. That will guide what we do next.

## 2022-01-18 NOTE — Telephone Encounter (Signed)
Spoke to patient.  Patient reports of nasal congestion, prod cough with yellow sputum and wheezing x2d.  Sob is baseline.  Denied f/c/s or additional sx. She is taking Nyquil once daily, trelegy once daily and albuterol HFA BID. Fully vaccinated against covid and flu.    Dr. Annamaria Boots, please advise. Thanks

## 2022-01-18 NOTE — Telephone Encounter (Signed)
Patient is aware of recommendations and voiced her understanding. Nothing further needed.   

## 2022-01-19 ENCOUNTER — Telehealth: Payer: Self-pay | Admitting: Internal Medicine

## 2022-01-19 DIAGNOSIS — J4550 Severe persistent asthma, uncomplicated: Secondary | ICD-10-CM

## 2022-01-20 ENCOUNTER — Telehealth: Payer: Self-pay | Admitting: Pharmacist

## 2022-01-20 MED ORDER — AZITHROMYCIN 250 MG PO TABS: 250.0000 mg | ORAL_TABLET | ORAL | 0 refills | Status: DC

## 2022-01-20 MED ORDER — TRELEGY ELLIPTA 100-62.5-25 MCG/ACT IN AEPB: 1.0000 | INHALATION_SPRAY | Freq: Every day | RESPIRATORY_TRACT | 11 refills | Status: DC

## 2022-01-20 NOTE — Telephone Encounter (Signed)
Called patient - Dupixent issue was resolved. Copay card information was incorrect so she had to re-enroll. States she will be picking up Sw[place] from [employer] later this week. Prescription was triaged to [place]  Knox Saliva, PharmD, MPH, BCPS Clinical Pharmacist (Rheumatology and Pulmonology)

## 2022-01-20 NOTE — Telephone Encounter (Signed)
With covid test negative, this is likely an ordinary viral respiratory infection. If she is coughing up infected looking stuff, we can call in a zpak just in case there is a bacterial infection- bronchitis or sinusitis. Otherwise just ride it out.

## 2022-01-20 NOTE — Telephone Encounter (Signed)
I spoke with the pt and verbalized understanding  Rx was sent  Nothing further needed

## 2022-01-20 NOTE — Telephone Encounter (Signed)
Patient called and stated that she got the patient assistance that you helped her with but states that it was only for 5 months and that she thought it was for a year.   Please advise

## 2022-01-20 NOTE — Telephone Encounter (Signed)
Called and spoke with patient who states that her covid test was negative. She states that she started taking Mucinex yesterday and it has helped some with her symptoms. She also has prednisone on hand but has not taken any.   Dr. Annamaria Boots please advise  - If you respond today 01/20/22 please send message back to triage as I am not in the office this afternoon

## 2022-01-21 ENCOUNTER — Other Ambulatory Visit (INDEPENDENT_AMBULATORY_CARE_PROVIDER_SITE_OTHER): Payer: BC Managed Care – PPO

## 2022-01-21 ENCOUNTER — Other Ambulatory Visit: Payer: BC Managed Care – PPO

## 2022-01-21 DIAGNOSIS — E119 Type 2 diabetes mellitus without complications: Secondary | ICD-10-CM

## 2022-01-21 DIAGNOSIS — E782 Mixed hyperlipidemia: Secondary | ICD-10-CM | POA: Diagnosis not present

## 2022-01-22 LAB — LIPID PANEL
Cholesterol: 152 mg/dL (ref 0–200)
HDL: 51.5 mg/dL (ref >39.00)
LDL Cholesterol: 79 mg/dL (ref 0–99)
NonHDL: 100.67
Total CHOL/HDL Ratio: 3
Triglycerides: 109 mg/dL (ref 0.0–149.0)
VLDL: 21.8 mg/dL (ref 0.0–40.0)

## 2022-01-22 LAB — COMPREHENSIVE METABOLIC PANEL
ALT: 15 U/L (ref 0–35)
AST: 18 U/L (ref 0–37)
Albumin: 3.7 g/dL (ref 3.5–5.2)
Alkaline Phosphatase: 96 U/L (ref 39–117)
BUN: 9 mg/dL (ref 6–23)
CO2: 29 mEq/L (ref 19–32)
Calcium: 9.3 mg/dL (ref 8.4–10.5)
Chloride: 105 mEq/L (ref 96–112)
Creatinine, Ser: 0.89 mg/dL (ref 0.40–1.20)
GFR: 73.62 mL/min (ref >60.00)
Glucose, Bld: 131 mg/dL — ABNORMAL HIGH (ref 70–99)
Potassium: 3.7 mEq/L (ref 3.5–5.1)
Sodium: 141 mEq/L (ref 135–145)
Total Bilirubin: 0.6 mg/dL (ref 0.2–1.2)
Total Protein: 6.2 g/dL (ref 6.0–8.3)

## 2022-01-22 LAB — HEMOGLOBIN A1C: Hgb A1c MFr Bld: 6.7 % — ABNORMAL HIGH (ref 4.6–6.5)

## 2022-03-02 ENCOUNTER — Other Ambulatory Visit: Payer: Self-pay | Admitting: Internal Medicine

## 2022-03-09 ENCOUNTER — Other Ambulatory Visit: Payer: Self-pay | Admitting: Family

## 2022-03-22 ENCOUNTER — Telehealth: Payer: Self-pay | Admitting: Family

## 2022-03-22 ENCOUNTER — Telehealth: Payer: Self-pay | Admitting: Pharmacist

## 2022-03-22 NOTE — Telephone Encounter (Signed)
Patient returned call.  She states she started atorvastatin (looks like she has been on this per dispense report). She is planning on calling her PCP to determine if she can be switched to another statin. ? ?She states that she wants to stay on [place] that she feels a difference. She is able to smell better, is no longer snoring, and is breathing much better. ? ?Will place provider form in Dr. Annamaria Boots for signature and can be faxed back to pharmacy thereafter ? ?Knox Saliva, PharmD, MPH, BCPS ?Clinical Pharmacist (Rheumatology and Pulmonology) ?

## 2022-03-22 NOTE — Telephone Encounter (Signed)
Pt thinks atorvastatin is hurting her muscles. She is feeling muscle aches and would like to talk about trying something else. Please advise.  ?

## 2022-03-22 NOTE — Telephone Encounter (Signed)
Received fax from [facility] stating that patient called reporting muscle soreness not in the injection site area. AtC patient to review . Unable to reach - VM box is full ? ?Will ATC later ? ?Knox Saliva, PharmD, MPH, BCPS ?Clinical Pharmacist (Rheumatology and Pulmonology) ?

## 2022-03-23 NOTE — Telephone Encounter (Signed)
Patient advised ok to stay off medication and follow up on 5-12 as scheduled ?

## 2022-04-30 ENCOUNTER — Ambulatory Visit (INDEPENDENT_AMBULATORY_CARE_PROVIDER_SITE_OTHER): Payer: BC Managed Care – PPO | Admitting: Family

## 2022-04-30 VITALS — BP 149/77 | HR 87 | Temp 98.0°F | Resp 16 | Ht 67.0 in | Wt 197.0 lb

## 2022-04-30 DIAGNOSIS — I10 Essential (primary) hypertension: Secondary | ICD-10-CM

## 2022-04-30 DIAGNOSIS — J4540 Moderate persistent asthma, uncomplicated: Secondary | ICD-10-CM

## 2022-04-30 DIAGNOSIS — M722 Plantar fascial fibromatosis: Secondary | ICD-10-CM | POA: Diagnosis not present

## 2022-04-30 DIAGNOSIS — E119 Type 2 diabetes mellitus without complications: Secondary | ICD-10-CM | POA: Diagnosis not present

## 2022-04-30 DIAGNOSIS — F4321 Adjustment disorder with depressed mood: Secondary | ICD-10-CM

## 2022-04-30 DIAGNOSIS — I872 Venous insufficiency (chronic) (peripheral): Secondary | ICD-10-CM

## 2022-04-30 NOTE — Progress Notes (Signed)
? ?Subjective:  ? ?By signing my name below, I, Carylon Perches, attest that this documentation has been prepared under the direction and in the presence of St[place], NP 04/30/2022   ? ? Patient ID: [name], female    DOB: 09-Jan-1966, 56 y.o.   MRN: [id number] ? ?Chief Complaint  ?Patient presents with  ? Hypertension  ?  Here for follow up  ? Diabetes  ?  Here for follow up  ? Foot Pain  ?  Complains of pain on both feet and ankles.   ? ? ?HPI ?Patient is in today for an office visit. ? ?Joint/Feet Pain- She complains of persistent joint and feet pain. She states that her pain is on the bottom of her feet and has occasional swelling in her ankles. She was informed that she might have signs of Plantar fasciitis.  She denies of any numbness but does get a burning sensation at the bottom of her feet. The burning sensation is the strongest when she wakes up in the morning.  She takes 10 MG of Prednisone when she has flare ups. She notices swelling is better when on Prednisone. She is interested in seeing a podiatrist.  ?Lasix/Aldactone - She takes 20 Mg of Lasix. She states that she does not have frequent urination on Lasix. She has previously taken Aldactone but discontinued because her blood pressure at the time was normal.  ?Asthma - She is taking 300 MG/2ML of Dupixent. She states that her symptoms are well while taking the medication.  ? ?Health Maintenance Due  ?Topic Date Due  ? Hepatitis C Screening  Never done  ? Zoster Vaccines- Shingrix (2 of 2) 11/06/2019  ? PAP SMEAR-Modifier  03/03/2021  ? OPHTHALMOLOGY EXAM  11/06/2021  ? FOOT EXAM  03/24/2022  ? ? ?Past Medical History:  ?Diagnosis Date  ? Allergic rhinitis, cause unspecified   ? Allergy   ? Asthma   ? Clotting disorder ([place])   ? DVT 2005  ? Diabetes type 2, controlled ([place])   ? pt denies- no meds- Pre DM-   ? History of DVT (deep vein thrombosis) 2005  ? Pt reports blood clot below knee ?unsure which leg.  ? Hyperlipidemia   ?  Nasal polyps   ? Unspecified essential hypertension   ? ? ?Past Surgical History:  ?Procedure Laterality Date  ? COLONOSCOPY    ? LASIK Bilateral 2008  ? NASAL SINUS SURGERY  2008  ? Dr Erik Obey  ? OVARIAN CYST REMOVAL  02/2016  ? pt reported-- Dr Philis Pique  ? POLYPECTOMY    ? UTERINE FIBROID SURGERY  2005  ? ? ?Family History  ?Problem Relation Age of Onset  ? Cancer Mother 54  ?     ?carcinoid tumor (had surgical removal)  ? Diabetes Mother   ?     borderline  ? Stomach cancer Mother   ? Liver cancer Mother   ? Heart disease Father   ?     died 65, from MI, had hx of ETOH abuse and liver disease  ? Thyroid cancer Sister   ?     (diagnosed at 69) half sister (same dad)   ? Diabetes type II Brother   ? Kidney disease Neg Hx   ? Hypertension Neg Hx   ? Hyperlipidemia Neg Hx   ? Colon cancer Neg Hx   ? Esophageal cancer Neg Hx   ? Rectal cancer Neg Hx   ? Colon polyps Neg Hx   ? ? ?  Social History  ? ?Socioeconomic History  ? Marital status: Married  ?  Spouse name: Not on file  ? Number of children: Not on file  ? Years of education: Not on file  ? Highest education level: Not on file  ?Occupational History  ? Occupation: Therapist, [employer]  ?Tobacco Use  ? Smoking status: Never  ? Smokeless tobacco: Never  ?Vaping Use  ? Vaping Use: Never used  ?Substance and Sexual Activity  ? Alcohol use: Yes  ?  Alcohol/week: 0.0 standard drinks  ?  Comment: 1 glass of wine per year  ? Drug use: No  ? Sexual activity: Yes  ?  Birth control/protection: None  ?Other Topics Concern  ? Not on file  ?Social History Narrative  ? Married  ? No children  ? No pets  ? Works at [employer] as a Freight forwarder   ? Enjoys movies, shopping, spending time with friends/family  ? Completed HS, 6 mos of college.    ? ?Social Determinants of Health  ? ?Financial Resource Strain: Not on file  ?Food Insecurity: Not on file  ?Transportation Needs: Not on file  ?Physical Activity: Not on file  ?Stress: Not on file  ?Social Connections: Not on file  ?Intimate Partner  Violence: Not on file  ? ? ?Outpatient Medications Prior to Visit  ?Medication Sig Dispense Refill  ? albuterol (PROVENTIL) (2.5 MG/3ML) 0.083% nebulizer solution Take 2.5 mg by nebulization every 4 (four) hours as needed for wheezing or shortness of breath.    ? albuterol (VENTOLIN HFA) 108 (90 Base) MCG/ACT inhaler INHALE 2 PUFFS BY MOUTH EVERY 6 HOURS AS NEEDED FOR WHEEZING FOR SHORTNESS OF BREATH 27 g 0  ? cetirizine (ZYRTEC) 10 MG tablet Take 10 mg by mouth daily.    ? COVID-19 mRNA bivalent vaccine, Moderna, (MODERNA COVID-19 BIVAL BOOSTER) 50 MCG/0.5ML injection Inject into the muscle. 0.5 mL 0  ? Dupilumab (DUPIXENT) 300 MG/2ML SOPN Inject 300 mg into the skin every 14 (fourteen) days. 4 mL 5  ? fluticasone (FLONASE) 50 MCG/ACT nasal spray 2 sprays each nostril daily 16 g 12  ? Fluticasone-Umeclidin-Vilant (TRELEGY ELLIPTA) 100-62.5-25 MCG/ACT AEPB Inhale 1 puff into the lungs daily. 60 each 11  ? furosemide (LASIX) 20 MG tablet Take 20 mg by mouth daily as needed for edema.    ? lisinopril (ZESTRIL) 2.5 MG tablet Take 1 tablet by mouth once daily 90 tablet 0  ? predniSONE (DELTASONE) 10 MG tablet Use sparingly as directed 200 tablet 0  ? Prenatal Vit-Fe Sulfate-FA (PRENATAL VITAMIN PO) Take 1 capsule by mouth daily.    ? atorvastatin (LIPITOR) 10 MG tablet Take 1 tablet by mouth once daily (Patient not taking: Reported on 04/30/2022) 90 tablet 0  ? azithromycin (ZITHROMAX) 250 MG tablet Take 1 tablet (250 mg total) by mouth as directed. 6 tablet 0  ? OVER THE COUNTER MEDICATION Take 1 Dose by mouth daily. Goli chews- appetite suppre, green veggie, fruit, relaxes x 4 total    ? ?No facility-administered medications prior to visit.  ? ? ?Allergies  ?Allergen Reactions  ? Aspirin   ?  wheezing  ? Latex Hives  ? Penicillins Hives  ? Shellfish Allergy   ? Shellfish-Derived Products Other (See Comments)  ? ? ?Review of Systems  ?Musculoskeletal:  Positive for joint pain and myalgias (Feet Pain).  ? ?   ?Objective:   ?  ?Physical Exam ?Constitutional:   ?   General: She is not in acute distress. ?  Appearance: Normal appearance. She is not ill-appearing.  ?HENT:  ?   Head: Normocephalic and atraumatic.  ?   Right Ear: External ear normal.  ?   Left Ear: External ear normal.  ?Eyes:  ?   Extraocular Movements: Extraocular movements intact.  ?   Pupils: Pupils are equal, round, and reactive to light.  ?Cardiovascular:  ?   Rate and Rhythm: Normal rate and regular rhythm.  ?   Pulses:     ?     Dorsalis pedis pulses are 2+ on the right side and 2+ on the left side.  ?     Posterior tibial pulses are 2+ on the right side and 2+ on the left side.  ?   Heart sounds: Normal heart sounds. No murmur heard. ?  No gallop.  ?Pulmonary:  ?   Effort: Pulmonary effort is normal. No respiratory distress.  ?   Breath sounds: Normal breath sounds. No wheezing or rales.  ?Musculoskeletal:  ?   Right lower leg: 2+ Edema present.  ?   Left lower leg: 2+ Edema present.  ?Skin: ?   General: Skin is warm and dry.  ?Neurological:  ?   Mental Status: She is alert and oriented to person, place, and time.  ?Psychiatric:     ?   Mood and Affect: Mood normal.     ?   Behavior: Behavior normal.     ?   Judgment: Judgment normal.  ? ? ?BP (!) 149/77 (BP Location: Right Arm, Patient Position: Sitting, Cuff Size: Small)   Pulse 87   Temp 98 ?F (36.7 ?C) (Oral)   Resp 16   Ht '5\' 7"'$  (1.702 m)   Wt 197 lb (89.4 kg)   LMP 06/22/2018   SpO2 100%   BMI 30.85 kg/m?  ?Wt Readings from Last 3 Encounters:  ?04/30/22 197 lb (89.4 kg)  ?01/01/22 195 lb (88.5 kg)  ?11/26/21 192 lb (87.1 kg)  ? ? ?   ?Assessment & Plan:  ? ?Problem List Items Addressed This Visit   ? ?  ? Unprioritized  ? Plantar fasciitis - Primary  ?  New. Pt is advised as follows: ? ?Please ice your feet on a frozen water bottle twice daily. ?You can rub voltaren gel to the bottoms of your feet twice daily. ?Purchase knee high compression stockings to wear to work. ?Wear good supportive tennis  shoes with a good arch. ?  ?  ? Relevant Orders  ? Ambulatory referral to Podiatry  ? Moderate persistent asthma  ?  Stable/improved on Dupixant.  ? ?  ?  ? Grief reaction  ?  She continues to grieve the loss of h

## 2022-04-30 NOTE — Assessment & Plan Note (Addendum)
BP Readings from Last 3 Encounters:  ?01/01/22 131/77  ?11/26/21 113/76  ?11/17/21 117/83  ? ?BP at goal. Continue lisinopril.  ?

## 2022-04-30 NOTE — Assessment & Plan Note (Addendum)
Lab Results  ?Component Value Date  ? HGBA1C 6.7 (H) 01/21/2022  ? HGBA1C 6.9 (H) 10/12/2021  ? HGBA1C 6.8 (H) 03/24/2021  ? ?Lab Results  ?Component Value Date  ? MICROALBUR 3.1 (H) 01/02/2020  ? Lindsay 79 01/21/2022  ? CREATININE 0.89 01/21/2022  ? ?Diabetes is well controlled. Continue diabetic diet.  ?

## 2022-04-30 NOTE — Patient Instructions (Signed)
Please ice your feet on a frozen water bottle twice daily. ?You can rub voltaren gel to the bottoms of your feet twice daily. ?Purchase knee high compression stockings to wear to work. ?Wear good supportive tennis shoes with a good arch. ?

## 2022-05-03 DIAGNOSIS — M722 Plantar fascial fibromatosis: Secondary | ICD-10-CM | POA: Insufficient documentation

## 2022-05-03 NOTE — Assessment & Plan Note (Signed)
She continues to grieve the loss of her mother. This will be her first Mother's day without her. She states her friends have invited her over on mother's day to try to keep her busy. ?

## 2022-05-03 NOTE — Assessment & Plan Note (Addendum)
We discussed that this is the most likely cause for her LE edema. She does not feel that lasix is helpful.  She has taken aldactone in the past.  I would like to see her potassium and renal function. Could consider bid lasix some days.  I suggested that she wear compression stockings to help with her edema. She is frustrated that there is not a definite way to prevent the swelling.  Reassurance provided.  ?

## 2022-05-03 NOTE — Assessment & Plan Note (Signed)
New. Pt is advised as follows: ? ?Please ice your feet on a frozen water bottle twice daily. ?You can rub voltaren gel to the bottoms of your feet twice daily. ?Purchase knee high compression stockings to wear to work. ?Wear good supportive tennis shoes with a good arch. ?

## 2022-05-03 NOTE — Assessment & Plan Note (Signed)
Stable/improved on Dupixant.  ?

## 2022-05-07 ENCOUNTER — Telehealth: Payer: Self-pay | Admitting: Family

## 2022-05-07 NOTE — Telephone Encounter (Signed)
See mychart.  

## 2022-05-13 ENCOUNTER — Other Ambulatory Visit: Payer: BC Managed Care – PPO

## 2022-05-13 ENCOUNTER — Other Ambulatory Visit (INDEPENDENT_AMBULATORY_CARE_PROVIDER_SITE_OTHER): Payer: BC Managed Care – PPO

## 2022-05-13 DIAGNOSIS — I10 Essential (primary) hypertension: Secondary | ICD-10-CM | POA: Diagnosis not present

## 2022-05-13 DIAGNOSIS — E119 Type 2 diabetes mellitus without complications: Secondary | ICD-10-CM | POA: Diagnosis not present

## 2022-05-13 LAB — BASIC METABOLIC PANEL
BUN: 12 mg/dL (ref 6–23)
CO2: 27 mEq/L (ref 19–32)
Calcium: 9.4 mg/dL (ref 8.4–10.5)
Chloride: 106 mEq/L (ref 96–112)
Creatinine, Ser: 0.79 mg/dL (ref 0.40–1.20)
GFR: 84.75 mL/min (ref >60.00)
Glucose, Bld: 137 mg/dL — ABNORMAL HIGH (ref 70–99)
Potassium: 3.8 mEq/L (ref 3.5–5.1)
Sodium: 142 mEq/L (ref 135–145)

## 2022-05-13 LAB — HEMOGLOBIN A1C: Hgb A1c MFr Bld: 6.5 % (ref 4.6–6.5)

## 2022-05-14 ENCOUNTER — Encounter: Payer: Self-pay | Admitting: Family

## 2022-05-14 ENCOUNTER — Ambulatory Visit (INDEPENDENT_AMBULATORY_CARE_PROVIDER_SITE_OTHER): Payer: BC Managed Care – PPO | Admitting: Podiatry

## 2022-05-14 ENCOUNTER — Ambulatory Visit (INDEPENDENT_AMBULATORY_CARE_PROVIDER_SITE_OTHER): Payer: BC Managed Care – PPO

## 2022-05-14 ENCOUNTER — Other Ambulatory Visit: Payer: Self-pay | Admitting: Family

## 2022-05-14 DIAGNOSIS — M25572 Pain in left ankle and joints of left foot: Secondary | ICD-10-CM | POA: Diagnosis not present

## 2022-05-14 DIAGNOSIS — L659 Nonscarring hair loss, unspecified: Secondary | ICD-10-CM | POA: Insufficient documentation

## 2022-05-14 DIAGNOSIS — M25571 Pain in right ankle and joints of right foot: Secondary | ICD-10-CM

## 2022-05-14 DIAGNOSIS — M722 Plantar fascial fibromatosis: Secondary | ICD-10-CM

## 2022-05-14 DIAGNOSIS — M25473 Effusion, unspecified ankle: Secondary | ICD-10-CM | POA: Diagnosis not present

## 2022-05-14 NOTE — Patient Instructions (Addendum)
For inserts I like powersteps, superfeet, aetrex inserts  For instructions on how to put on your Night Splint, please visit [email or web address]   Plantar Fasciitis (Heel Spur Syndrome) with Rehab The plantar fascia is a fibrous, ligament-like, soft-tissue structure that spans the bottom of the foot. Plantar fasciitis is a condition that causes pain in the foot due to inflammation of the tissue. SYMPTOMS  Pain and tenderness on the underneath side of the foot. Pain that worsens with standing or walking. CAUSES  Plantar fasciitis is caused by irritation and injury to the plantar fascia on the underneath side of the foot. Common mechanisms of injury include: Direct trauma to bottom of the foot. Damage to a small nerve that runs under the foot where the main fascia attaches to the heel bone. Stress placed on the plantar fascia due to bone spurs. RISK INCREASES WITH:  Activities that place stress on the plantar fascia (running, jumping, pivoting, or cutting). Poor strength and flexibility. Improperly fitted shoes. Tight calf muscles. Flat feet. Failure to warm-up properly before activity. Obesity. PREVENTION Warm up and stretch properly before activity. Allow for adequate recovery between workouts. Maintain physical fitness: Strength, flexibility, and endurance. Cardiovascular fitness. Maintain a health body weight. Avoid stress on the plantar fascia. Wear properly fitted shoes, including arch supports for individuals who have flat feet.  PROGNOSIS  If treated properly, then the symptoms of plantar fasciitis usually resolve without surgery. However, occasionally surgery is necessary.  RELATED COMPLICATIONS  Recurrent symptoms that may result in a chronic condition. Problems of the lower back that are caused by compensating for the injury, such as limping. Pain or weakness of the foot during push-off following surgery. Chronic inflammation, scarring, and partial or complete  fascia tear, occurring more often from repeated injections.  TREATMENT  Treatment initially involves the use of ice and medication to help reduce pain and inflammation. The use of strengthening and stretching exercises may help reduce pain with activity, especially stretches of the Achilles tendon. These exercises may be performed at home or with a therapist. Your caregiver may recommend that you use heel cups of arch supports to help reduce stress on the plantar fascia. Occasionally, corticosteroid injections are given to reduce inflammation. If symptoms persist for greater than 6 months despite non-surgical (conservative), then surgery may be recommended.   MEDICATION  If pain medication is necessary, then nonsteroidal anti-inflammatory medications, such as aspirin and ibuprofen, or other minor pain relievers, such as acetaminophen, are often recommended. Do not take pain medication within 7 days before surgery. Prescription pain relievers may be given if deemed necessary by your caregiver. Use only as directed and only as much as you need. Corticosteroid injections may be given by your caregiver. These injections should be reserved for the most serious cases, because they may only be given a certain number of times.  HEAT AND COLD Cold treatment (icing) relieves pain and reduces inflammation. Cold treatment should be applied for 10 to 15 minutes every 2 to 3 hours for inflammation and pain and immediately after any activity that aggravates your symptoms. Use ice packs or massage the area with a piece of ice (ice massage). Heat treatment may be used prior to performing the stretching and strengthening activities prescribed by your caregiver, physical therapist, or athletic trainer. Use a heat pack or soak the injury in warm water.  SEEK IMMEDIATE MEDICAL CARE IF: Treatment seems to offer no benefit, or the condition worsens. Any medications produce adverse side effects.  EXERCISES- RANGE OF  MOTION (ROM) AND STRETCHING EXERCISES - Plantar Fasciitis (Heel Spur Syndrome) These exercises may help you when beginning to rehabilitate your injury. Your symptoms may resolve with or without further involvement from your physician, physical therapist or athletic trainer. While completing these exercises, remember:  Restoring tissue flexibility helps normal motion to return to the joints. This allows healthier, less painful movement and activity. An effective stretch should be held for at least 30 seconds. A stretch should never be painful. You should only feel a gentle lengthening or release in the stretched tissue.  RANGE OF MOTION - Toe Extension, Flexion Sit with your right / left leg crossed over your opposite knee. Grasp your toes and gently pull them back toward the top of your foot. You should feel a stretch on the bottom of your toes and/or foot. Hold this stretch for 10 seconds. Now, gently pull your toes toward the bottom of your foot. You should feel a stretch on the top of your toes and or foot. Hold this stretch for 10 seconds. Repeat  times. Complete this stretch 3 times per day.   RANGE OF MOTION - Ankle Dorsiflexion, Active Assisted Remove shoes and sit on a chair that is preferably not on a carpeted surface. Place right / left foot under knee. Extend your opposite leg for support. Keeping your heel down, slide your right / left foot back toward the chair until you feel a stretch at your ankle or calf. If you do not feel a stretch, slide your bottom forward to the edge of the chair, while still keeping your heel down. Hold this stretch for 10 seconds. Repeat 3 times. Complete this stretch 2 times per day.   STRETCH  Gastroc, Standing Place hands on wall. Extend right / left leg, keeping the front knee somewhat bent. Slightly point your toes inward on your back foot. Keeping your right / left heel on the floor and your knee straight, shift your weight toward the wall, not  allowing your back to arch. You should feel a gentle stretch in the right / left calf. Hold this position for 10 seconds. Repeat 3 times. Complete this stretch 2 times per day.  STRETCH  Soleus, Standing Place hands on wall. Extend right / left leg, keeping the other knee somewhat bent. Slightly point your toes inward on your back foot. Keep your right / left heel on the floor, bend your back knee, and slightly shift your weight over the back leg so that you feel a gentle stretch deep in your back calf. Hold this position for 10 seconds. Repeat 3 times. Complete this stretch 2 times per day.  STRETCH  Gastrocsoleus, Standing  Note: This exercise can place a lot of stress on your foot and ankle. Please complete this exercise only if specifically instructed by your caregiver.  Place the ball of your right / left foot on a step, keeping your other foot firmly on the same step. Hold on to the wall or a rail for balance. Slowly lift your other foot, allowing your body weight to press your heel down over the edge of the step. You should feel a stretch in your right / left calf. Hold this position for 10 seconds. Repeat this exercise with a slight bend in your right / left knee. Repeat 3 times. Complete this stretch 2 times per day.   STRENGTHENING EXERCISES - Plantar Fasciitis (Heel Spur Syndrome)  These exercises may help you when beginning to rehabilitate your injury. They may resolve  your symptoms with or without further involvement from your physician, physical therapist or athletic trainer. While completing these exercises, remember:  Muscles can gain both the endurance and the strength needed for everyday activities through controlled exercises. Complete these exercises as instructed by your physician, physical therapist or athletic trainer. Progress the resistance and repetitions only as guided.  STRENGTH - Towel Curls Sit in a chair positioned on a non-carpeted surface. Place your foot  on a towel, keeping your heel on the floor. Pull the towel toward your heel by only curling your toes. Keep your heel on the floor. Repeat 3 times. Complete this exercise 2 times per day.  STRENGTH - Ankle Inversion Secure one end of a rubber exercise band/tubing to a fixed object (table, pole). Loop the other end around your foot just before your toes. Place your fists between your knees. This will focus your strengthening at your ankle. Slowly, pull your big toe up and in, making sure the band/tubing is positioned to resist the entire motion. Hold this position for 10 seconds. Have your muscles resist the band/tubing as it slowly pulls your foot back to the starting position. Repeat 3 times. Complete this exercises 2 times per day.  Document Released: 12/06/2005 Document Revised: 02/28/2012 Document Reviewed: 03/20/2009 [facility] Patient Information 2014 [place], [place].

## 2022-05-15 NOTE — Progress Notes (Unsigned)
Patient ID: [name], female    DOB: [birth year], 56 y.o.   MRN: [id number] F followed for moderate persistent asthma with COPD (requiring frequent steroids , Failed Xolair, , hx nasal polyps, allergic rhinitis, hx DVT PFT- 09/09/11- FEV1 1.64/ 54%, FEV1/FVC 0.54, FEF25-75% 0.74/ 22%, insignif response to bronchodilator TLC 0.74, DLCO 72%. Moderate obstructive disease, mild restriction, mild reduction of DLCO. Office spirometry- mild obstructive airways disease. FEV1 1.95/73%, FVC 3.01/93%, FEV1/FVC 0.65, FEF 25-75% 1.26/38%.   -----------------------------------------------------------------    11/17/21- 56 year old female never smoker followed for severe persistent Asthma with COPD (frequent steroids, failed Xolair), history nasal Polyps/ Samter's Triad/Aspirin Allergy, allergic Rhinitis, history DVT -Albuterol HFA, neb albuterol/ atrovent, EpiPen, Trelegy100,  prednisone 10 mg, Fasenra/6 weeks, Zyrtec, albuterol '2mg'$  tab BID, Flonase, nasal saline,  Covid vax-  Moderna Flu vax-had Grieving- mother died Saw Dr Redmond Baseman ENT for chronic nasal polyposis- she wtw on repeat surgery.Prednisone and abx in August. He noted she snores and doesn't sleep well. Got Fasenra inj today. We focused on control of her asthma and rhinitis today but will need to look at the issue of snoring and sleep problems at a subsequent visit. Significant wheezing last night and asks for Depo-Medrol as well as a prednisone supply bottle to use sparingly as she has in the past.  Trelegy was too expensive and she is only using a rescue inhaler.  She does have a nebulizer. CXR 03/10/20- No active cardiopulmonary disease.  05/18/22- 56 year old female never smoker followed for severe persistent Asthma with COPD (frequent steroids, failed Xolair), Nasal Polyps/ Samter's Triad/Aspirin Allergy, allergic Rhinitis, history DVT -Albuterol HFA, neb albuterol/ atrovent, EpiPen, Trelegy100,  prednisone 10 mg, Fasenra/6 weeks,  Zyrtec, albuterol '2mg'$  tab BID, Flonase, nasal saline,  Covid vax-  Moderna Flu vax-had    Review of Systems-See HPI     + = positive Constitutional:   No weight loss, night sweats,  Fevers, chills, fatigue, lassitude. HEENT:   No headaches,  Difficulty swallowing,  Tooth/dental problems,  Sore throat,                No sneezing, itching, ear ache,, + post nasal drip. + nasal congestion. CV:  No chest pain,  Orthopnea, PND, swelling in lower extremities, anasarca, dizziness, palpitations GI  No heartburn, indigestion, abdominal pain, nausea, vomiting, Resp:Usually some shortness of breath with exertion, not at rest.  No excess mucus,  productive cough,          + non-productive cough,  No coughing up of blood.   change in color of mucus.   +wheezing.   Skin: no rash or lesions. GU: . MS:  No joint pain, + swelling.   Neuro- nothing unusual Psych:  No change in mood or affect. No depression or anxiety.  No memory loss.  Objective:   Physical Exam General- Alert, Oriented, Affect-appropriate, Distress- none acute  Relaxed and conversational   + overweight, + pink cheeks/ steroid facies Skin- rash-none, lesions- none, excoriation- none Lymphadenopathy- none Head- atraumatic            Eyes- Gross vision intact, PERRLA, conjunctivae clear secretions            Ears- Hearing, canals normal            Nose- +Turbinate edema, No-Septal dev, mucus, , erosion, perforation. No definite polyps             Throat- Mallampati III-IV, mucosa -not red , drainage- none, tonsils- atrophic Neck- flexible , trachea midline, no  stridor , thyroid nl, carotid no bruit Chest - symmetrical excursion , unlabored           Heart/CV- RRR , no murmur , no gallop  , no rub, nl s1 s2                           - JVD- none , edema- none , stasis changes- none, varices- none           Lung-  Clear/ diminished, wheeze- none,  unlabored, cough- none , dullness-none, rub- none           Chest wall-  Abd-  Br/ Gen/  Rectal- Not done, not indicated Extrem- cyanosis- none, clubbing, none, atrophy- none, strength- nl.  Neuro- grossly intact to observation

## 2022-05-17 NOTE — Progress Notes (Signed)
Subjective:   Patient ID: [name], female   DOB: 56 y.o.   MRN: [id number]   HPI 56 year old female presents the office with concerns of bilateral foot discomfort she is also been swelling to her ankles.  Left side is worse than the right.  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  The swelling has been ongoing for about 6 years.  The pain in her feet started about 5 months ago.  She has most of the discomfort along the arch and the bottoms of her feet.  In the mornings is worse.  She states that as she walks for couple minutes the sensation eases up.  In regards to the swelling discomfort she seen cardiology as well as rheumatology.  She tried changing shoes, icing, Voltaren gel.  No specific injury that she reports.  She has no other concerns today.   Review of Systems  All other systems reviewed and are negative.  Past Medical History:  Diagnosis Date   Allergic rhinitis, cause unspecified    Allergy    Asthma    Clotting disorder ([place])    DVT 2005   Diabetes type 2, controlled ([place])    pt denies- no meds- Pre DM-    History of DVT (deep vein thrombosis) 2005   Pt reports blood clot below knee ?unsure which leg.   Hyperlipidemia    Nasal polyps    Unspecified essential hypertension     Past Surgical History:  Procedure Laterality Date   COLONOSCOPY     LASIK Bilateral 2008   NASAL SINUS SURGERY  3052   Dr Erik Obey   OVARIAN CYST REMOVAL  02/2016   pt reported-- Dr Philis Pique   POLYPECTOMY     UTERINE FIBROID SURGERY  2005     Current Outpatient Medications:    albuterol (PROVENTIL) (2.5 MG/3ML) 0.083% nebulizer solution, Take 2.5 mg by nebulization every 4 (four) hours as needed for wheezing or shortness of breath., Disp: , Rfl:    albuterol (VENTOLIN HFA) 108 (90 Base) MCG/ACT inhaler, INHALE 2 PUFFS BY MOUTH EVERY 6 HOURS AS NEEDED FOR WHEEZING FOR SHORTNESS OF BREATH, Disp: 27 g, Rfl: 0   cetirizine (ZYRTEC) 10 MG tablet, Take 10 mg by mouth  daily., Disp: , Rfl:    COVID-19 mRNA bivalent vaccine, Moderna, (MODERNA COVID-19 BIVAL BOOSTER) 50 MCG/0.5ML injection, Inject into the muscle., Disp: 0.5 mL, Rfl: 0   Dupilumab (DUPIXENT) 300 MG/2ML SOPN, Inject 300 mg into the skin every 14 (fourteen) days., Disp: 4 mL, Rfl: 5   fluticasone (FLONASE) 50 MCG/ACT nasal spray, 2 sprays each nostril daily, Disp: 16 g, Rfl: 12   Fluticasone-Umeclidin-Vilant (TRELEGY ELLIPTA) 100-62.5-25 MCG/ACT AEPB, Inhale 1 puff into the lungs daily., Disp: 60 each, Rfl: 11   furosemide (LASIX) 20 MG tablet, Take 20 mg by mouth daily as needed for edema., Disp: , Rfl:    lisinopril (ZESTRIL) 2.5 MG tablet, Take 1 tablet by mouth once daily, Disp: 90 tablet, Rfl: 0   predniSONE (DELTASONE) 10 MG tablet, Use sparingly as directed, Disp: 200 tablet, Rfl: 0   Prenatal Vit-Fe Sulfate-FA (PRENATAL VITAMIN PO), Take 1 capsule by mouth daily., Disp: , Rfl:   Allergies  Allergen Reactions   Aspirin     wheezing   Latex Hives   Penicillins Hives   Shellfish Allergy    Shellfish-Derived Products Other (See Comments)          Objective:  Physical Exam  General: AAO x3,  NAD  Dermatological: Skin is warm, dry and supple bilateral. There are no open sores, no preulcerative lesions, no rash or signs of infection present.  Vascular: Dorsalis Pedis artery and Posterior Tibial artery pedal pulses are 2/4 bilateral with immedate capillary fill time. There is no pain with calf compression, swelling, warmth, erythema.  The pain and edema present to the ankle there is no discomfort associate with ankle swelling at this time.  Neruologic: Grossly intact via light touch bilateral.  Negative Tinel sign.  Musculoskeletal: There is a decreased medial arch upon weightbearing bilaterally.  The majority discomfort today is on the plantar aspect calcaneal insertion of the plantar fashion into the calcaneus.  Also, on the arch of the foot.  There is no pain with lateral  compression of calcaneus.  No edema, erythema.  Ankle, subtalar joint range of motion intact.  Muscular strength 5/5 in all groups tested bilateral.  Gait: Unassisted, Nonantalgic.       Assessment:   Bilateral foot pain, Plantar fasciitis; right ankle swelling     Plan:  -Treatment options discussed including all alternatives, risks, and complications -Etiology of symptoms were discussed -X-rays were obtained and reviewed with the patient.  3 views of bilateral feet and ankles were obtained.  No evidence of acute fracture. -We discussed stretching, icing on a regular basis.  Dispensed plantar fascial braces to help support the plantar fascia, day and night splint for additional stability at nighttime and to help with the tightness.  Referral to physical therapy for benchmark physical therapy placed.  Discussed shoe modifications good arch support.  We will check insurance coverage for custom orthotics.  She is can try over-the-counter inserts in the meantime. -Venous reflux study for swelling  Return in about 6 weeks (around 06/25/2022).  Trula Slade DPM

## 2022-05-18 ENCOUNTER — Ambulatory Visit (INDEPENDENT_AMBULATORY_CARE_PROVIDER_SITE_OTHER): Payer: BC Managed Care – PPO | Admitting: Internal Medicine

## 2022-05-18 ENCOUNTER — Encounter: Payer: Self-pay | Admitting: Internal Medicine

## 2022-05-18 VITALS — BP 132/80 | HR 75 | Temp 98.1°F | Ht 67.0 in | Wt 195.2 lb

## 2022-05-18 DIAGNOSIS — J449 Chronic obstructive pulmonary disease, unspecified: Secondary | ICD-10-CM | POA: Diagnosis not present

## 2022-05-18 DIAGNOSIS — Z7952 Long term (current) use of systemic steroids: Secondary | ICD-10-CM

## 2022-05-18 DIAGNOSIS — J339 Nasal polyp, unspecified: Secondary | ICD-10-CM

## 2022-05-18 MED ORDER — PREDNISONE 10 MG PO TABS: ORAL_TABLET | ORAL | 0 refills | Status: DC

## 2022-05-18 MED ORDER — FLUTICASONE PROPIONATE 50 MCG/ACT NA SUSP: NASAL | 12 refills | Status: DC

## 2022-05-18 NOTE — Assessment & Plan Note (Signed)
Better control with Dupixent and Trelegy. Plan-continue current meds.  Prednisone refilled but encouraged to be very sparing with use.

## 2022-05-18 NOTE — Assessment & Plan Note (Signed)
Followed with ENT.  Symptomatically improved on Dupixent and Flonase. Plan-continue Dupixent, refill Flonase

## 2022-05-18 NOTE — Patient Instructions (Signed)
Prednisone and Flonase refilled  Go as long as you can between prednisone use to let your adrenal glands regain function. The aching should gradually ease.  Glad the [place] seems to be working well.  Please call if we can help

## 2022-05-24 ENCOUNTER — Telehealth: Payer: Self-pay

## 2022-05-24 NOTE — Telephone Encounter (Signed)
Received notification from [facility] regarding a prior authorization for [place]. Authorization has been APPROVED from 05/24/2022 to 05/25/2023. Approval letter sent to scan center.  Authorization # [id number]

## 2022-05-24 NOTE — Telephone Encounter (Signed)
PA renewal initiated automatically by CoverMyMeds.  Submitted a Prior Authorization request to [facility] for [place] via CoverMyMeds. Will update once we receive a response.   Key: [id number]

## 2022-05-25 ENCOUNTER — Telehealth: Payer: Self-pay | Admitting: Podiatry

## 2022-05-25 ENCOUNTER — Encounter: Payer: Self-pay | Admitting: Podiatry

## 2022-05-25 ENCOUNTER — Other Ambulatory Visit: Payer: Self-pay | Admitting: Podiatry

## 2022-05-25 DIAGNOSIS — M25473 Effusion, unspecified ankle: Secondary | ICD-10-CM

## 2022-05-25 DIAGNOSIS — M722 Plantar fascial fibromatosis: Secondary | ICD-10-CM

## 2022-05-25 NOTE — Telephone Encounter (Signed)
Patient would like to speak to your nurse , phone disconnected before I could get complete message, and I tried to call back and it went to voicemail.

## 2022-05-25 NOTE — Telephone Encounter (Signed)
Patient calling for status of orders for PT and  Venus reflux study. Please advise.

## 2022-05-26 NOTE — Telephone Encounter (Signed)
Faxed PT referral to [facility] , confirmation received 05/26/22.

## 2022-05-27 ENCOUNTER — Ambulatory Visit (INDEPENDENT_AMBULATORY_CARE_PROVIDER_SITE_OTHER)
Admission: RE | Admit: 2022-05-27 | Discharge: 2022-05-27 | Disposition: A | Discharge status: Home / Self Care | Payer: BC Managed Care – PPO | Source: Ambulatory Visit | Attending: Internal Medicine | Admitting: Internal Medicine

## 2022-05-27 DIAGNOSIS — Z7952 Long term (current) use of systemic steroids: Secondary | ICD-10-CM | POA: Diagnosis not present

## 2022-05-29 ENCOUNTER — Encounter (HOSPITAL_COMMUNITY): Payer: Self-pay | Admitting: Emergency Medicine

## 2022-05-29 ENCOUNTER — Ambulatory Visit (HOSPITAL_COMMUNITY)
Admission: EM | Admit: 2022-05-29 | Discharge: 2022-05-29 | Disposition: A | Discharge status: Home / Self Care | Payer: BC Managed Care – PPO | Attending: Emergency Medicine | Admitting: Emergency Medicine

## 2022-05-29 DIAGNOSIS — H00011 Hordeolum externum right upper eyelid: Secondary | ICD-10-CM | POA: Diagnosis not present

## 2022-05-29 MED ORDER — POLYMYXIN B-TRIMETHOPRIM 10000-0.1 UNIT/ML-% OP SOLN: 1.0000 [drp] | OPHTHALMIC | 0 refills | Status: DC

## 2022-05-29 NOTE — ED Provider Notes (Signed)
[place]    CSN: [id number] Arrival date & time: 05/29/22  1653      History   Chief Complaint Chief Complaint  Patient presents with   Eye Problem    HPI [name] is a 56 y.o. female.   Presents with bump to the right upper eyelid causing pain beginning 1 day ago.  Endorses that she was able to see [name] pus in the center.  Has been holding warm compresses to the affected area, feels that it may have improved some today.  Denies eye itching, drainage, blurred vision or light sensitivity.  Denies use of contacts.  Past Medical History:  Diagnosis Date   Allergic rhinitis, cause unspecified    Allergy    Asthma    Clotting disorder ([place])    DVT 2005   Diabetes type 2, controlled ([place])    pt denies- no meds- Pre DM-    History of DVT (deep vein thrombosis) 2005   Pt reports blood clot below knee ?unsure which leg.   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   Diabetes type 2, controlled ([place])    Chronic venous insufficiency 09/16/2016   Varicose veins of both lower extremities with complications 32/67/1245   Uterine leiomyoma 05/25/2016   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   Rectal bleeding 10/20/2015   Obesity 04/27/2015   Abnormal EKG 03/19/2015   Preventative health care 02/17/2015   OCD (obsessive compulsive disorder) 01/14/2015   Essential hypertension 01/13/2015   Borderline diabetes 01/13/2015   Peripheral edema 06/17/2012    NASAL POLYP 01/01/2008   Seasonal and perennial allergic rhinitis 01/01/2008   Moderate persistent asthma 01/01/2008   DEEP VENOUS THROMBOPHLEBITIS, HX OF 01/01/2008   History of DVT (deep vein thrombosis) 2005    Past Surgical History:  Procedure Laterality Date   COLONOSCOPY     LASIK Bilateral 2008   NASAL SINUS SURGERY  8099   Dr Erik Obey   OVARIAN CYST REMOVAL  02/2016   pt reported-- Dr Philis Pique   POLYPECTOMY     UTERINE FIBROID SURGERY  2005    OB History   No obstetric history on file.      Home Medications    Prior to Admission medications   Medication Sig Start Date End Date Taking? Authorizing Provider  albuterol (PROVENTIL) (2.5 MG/3ML) 0.083% nebulizer solution Take 2.5 mg by nebulization every 4 (four) hours as needed for wheezing or shortness of breath.    [provider]  albuterol (VENTOLIN HFA) 108 (90 Base) MCG/ACT inhaler INHALE 2 PUFFS BY MOUTH EVERY 6 HOURS AS NEEDED FOR WHEEZING FOR SHORTNESS OF BREATH 03/02/22   Baird Lyons D, MD  cetirizine (ZYRTEC) 10 MG tablet Take 10 mg by mouth daily.    [provider]  COVID-19 mRNA bivalent vaccine, Moderna, (MODERNA COVID-19 BIVAL BOOSTER) 50 MCG/0.5ML injection Inject into the muscle. 10/12/21   Carlyle Basques, MD  Dupilumab (DUPIXENT) 300 MG/2ML SOPN Inject  300 mg into the skin every 14 (fourteen) days. 01/08/22   Deneise Lever, MD  fluticasone Asencion Islam) 50 MCG/ACT nasal spray 2 sprays each nostril daily 05/18/22   Baird Lyons D, MD  Fluticasone-Umeclidin-Vilant (TRELEGY ELLIPTA) 100-62.5-25 MCG/ACT AEPB Inhale 1 puff into the lungs daily. 01/20/22   Baird Lyons D, MD  furosemide (LASIX) 20 MG tablet Take 20 mg by mouth daily as needed for edema.    [provider]  lisinopril (ZESTRIL) 2.5 MG tablet Take 1 tablet by mouth once daily 03/10/22   [name], NP  predniSONE (DELTASONE) 10 MG tablet Use sparingly as directed 05/18/22   Baird Lyons D, MD  Prenatal Vit-Fe  Sulfate-FA (PRENATAL VITAMIN PO) Take 1 capsule by mouth daily.    [provider]    Family History Family History  Problem Relation Age of Onset   Cancer Mother 65       ?carcinoid tumor (had surgical removal)   Diabetes Mother        borderline   Stomach cancer Mother    Liver cancer Mother    Heart disease Father        died 64, from MI, had hx of ETOH abuse and liver disease   Thyroid cancer Sister        (diagnosed at 67) half sister (same dad)    Diabetes type II Brother    Kidney disease Neg Hx    Hypertension Neg Hx    Hyperlipidemia Neg Hx    Colon cancer Neg Hx    Esophageal cancer Neg Hx    Rectal cancer Neg Hx    Colon polyps Neg Hx     Social History Social History   Tobacco Use   Smoking status: Never   Smokeless tobacco: Never  Vaping Use   Vaping Use: Never used  Substance Use Topics   Alcohol use: Yes    Alcohol/week: 0.0 standard drinks of alcohol    Comment: 1 glass of wine per year   Drug use: No     Allergies   Aspirin, Latex, Penicillins, Shellfish allergy, and Shellfish-derived products   Review of Systems Review of Systems  Constitutional: Negative.   Eyes:  Positive for pain. Negative for photophobia, discharge, redness, itching and visual disturbance.  Respiratory: Negative.    Cardiovascular: Negative.   Skin: Negative.   Neurological: Negative.      Physical Exam Triage Vital Signs ED Triage Vitals  Enc Vitals Group     BP 05/29/22 1733 (!) 146/84     Pulse Rate 05/29/22 1733 99     Resp 05/29/22 1733 16     Temp 05/29/22 1733 98.8 F (37.1 C)     Temp Source 05/29/22 1733 Oral     SpO2 05/29/22 1733 97 %     Weight --      Height --      Head Circumference --      Peak Flow --      Pain Score 05/29/22 1732 7     Pain Loc --      Pain Edu? --      Excl. in [place]? --    No data found.  Updated Vital Signs BP (!) 146/84 (BP Location: Left Arm)   Pulse 99   Temp 98.8 F (37.1 C) (Oral)   Resp 16   LMP  06/22/2018   SpO2 97%   Visual Acuity Right Eye Distance:   Left Eye Distance:   Bilateral Distance:  Right Eye Near:   Left Eye Near:    Bilateral Near:     Physical Exam Constitutional:      Appearance: Normal appearance.  Eyes:     Comments: Less than 0.5 cm hordeolum present to the center of the right upper eyelid, mildly tender to palpation, no erythema noted, vision grossly intact, extraocular movements intact  Pulmonary:     Effort: Pulmonary effort is normal.  Neurological:     Mental Status: She is alert and oriented to person, place, and time. Mental status is at baseline.  Psychiatric:        Mood and Affect: Mood normal.        Behavior: Behavior normal.      UC Treatments / Results  Labs (all labs ordered are listed, but only abnormal results are displayed) Labs Reviewed - No data to display  EKG   Radiology No results found.  Procedures Procedures (including critical care time)  Medications Ordered in UC Medications - No data to display  Initial Impression / Assessment and Plan / UC Course  I have reviewed the triage vital signs and the nursing notes.  Pertinent labs & imaging results that were available during my care of the patient were reviewed by me and considered in my medical decision making (see chart for details).   Hordeolum  to the right upper eyelid  Vision is grossly intact, discussed that worsening symptoms typically self resolve in 2 to 3 weeks, will prescribe Polytrim as patient was able to notate Gerarda Conklin pus to the area, discussed administration, advised avoidance of eye rubbing or touching to prevent further irritation and contamination, may continue use of warm compresses and over-the-counter Tylenol for management of discomfort, recommended follow-up with her eye doctor if symptoms persist past 3 weeks or she begins to have visual disturbance Final Clinical Impressions(s) / UC Diagnoses   Final diagnoses:  None   Discharge  Instructions   None    ED Prescriptions   None    PDMP not reviewed this encounter.   Hans Eden, NP 05/29/22 1801

## 2022-05-29 NOTE — ED Triage Notes (Signed)
Pt reports a "stye" on her right eye. States she first noticed it yesterday. States have been using warm compressions on eye.

## 2022-05-29 NOTE — Discharge Instructions (Signed)
  Today you are being treated for a stye.  These typically resolve there is still during the 2 to 3-week. May use Tylenol 500 to 1000 mg every 6 hours for comfort Place 1 drop of Polytrim into the right eye every 4 hours for the next 7 days to cover for bacteria Apply a warm, wet cloth (warm compress) to your eye for 5-10 minutes, 4 to 6 times a day. Do not try to pop or drain the stye. Do not rub your eye. If your stye persist past 3 weeks or any point it begins to make it difficult for you to see, please schedule an appointment with your eye doctor for further evaluation and management

## 2022-06-01 ENCOUNTER — Telehealth: Payer: Self-pay | Admitting: *Deleted

## 2022-06-01 NOTE — Telephone Encounter (Addendum)
Patient is calling with questions about the referral for vein specialist.  She was not specific, returned the call back, no answer and could not leave voice message, mailbox full.  Patient called back for the status of the vein and vascular referral. I told her that I would check on the scheduling and call her back.

## 2022-06-07 ENCOUNTER — Telehealth: Payer: Self-pay | Admitting: Internal Medicine

## 2022-06-07 NOTE — Telephone Encounter (Signed)
Called and spoke with pt letting her know the info per CY and she verbalized understanding. Nothing further needed. 

## 2022-06-07 NOTE — Telephone Encounter (Signed)
Called and spoke with pt who states she has stopped taking the prednisone to try to get it out of her system. Pt said that she has had a lot of aching in her joints since stopping the prednisone. Pt wants to know if since stopping the prednisone if this could be a reason why she is hurting so bad.  Pt wants to know if she should go back to taking the prednisone due to all the pain she is having in her joints as she says when she does stop taking the prednisone it does make her joints hurt to the point it is almost unbearable.  If pt should not resume the prednisone, she wants to know if there is anything else that might be able to be recommended to help with the joint pain she is having. Dr. Annamaria Boots, please advise on this for pt.   Allergies  Allergen Reactions   Aspirin     wheezing   Latex Hives   Penicillins Hives   Shellfish Allergy    Shellfish-Derived Products Other (See Comments)     Current Outpatient Medications:    albuterol (PROVENTIL) (2.5 MG/3ML) 0.083% nebulizer solution, Take 2.5 mg by nebulization every 4 (four) hours as needed for wheezing or shortness of breath., Disp: , Rfl:    albuterol (VENTOLIN HFA) 108 (90 Base) MCG/ACT inhaler, INHALE 2 PUFFS BY MOUTH EVERY 6 HOURS AS NEEDED FOR WHEEZING FOR SHORTNESS OF BREATH, Disp: 27 g, Rfl: 0   cetirizine (ZYRTEC) 10 MG tablet, Take 10 mg by mouth daily., Disp: , Rfl:    COVID-19 mRNA bivalent vaccine, Moderna, (MODERNA COVID-19 BIVAL BOOSTER) 50 MCG/0.5ML injection, Inject into the muscle., Disp: 0.5 mL, Rfl: 0   Dupilumab (DUPIXENT) 300 MG/2ML SOPN, Inject 300 mg into the skin every 14 (fourteen) days., Disp: 4 mL, Rfl: 5   fluticasone (FLONASE) 50 MCG/ACT nasal spray, 2 sprays each nostril daily, Disp: 16 g, Rfl: 12   Fluticasone-Umeclidin-Vilant (TRELEGY ELLIPTA) 100-62.5-25 MCG/ACT AEPB, Inhale 1 puff into the lungs daily., Disp: 60 each, Rfl: 11   furosemide (LASIX) 20 MG tablet, Take 20 mg by mouth daily as needed for  edema., Disp: , Rfl:    lisinopril (ZESTRIL) 2.5 MG tablet, Take 1 tablet by mouth once daily, Disp: 90 tablet, Rfl: 0   predniSONE (DELTASONE) 10 MG tablet, Use sparingly as directed, Disp: 200 tablet, Rfl: 0   Prenatal Vit-Fe Sulfate-FA (PRENATAL VITAMIN PO), Take 1 capsule by mouth daily., Disp: , Rfl:    trimethoprim-polymyxin b (POLYTRIM) ophthalmic solution, Place 1 drop into the right eye every 4 (four) hours., Disp: 10 mL, Rfl: 0

## 2022-06-07 NOTE — Telephone Encounter (Signed)
She is withdrawing from prednisone. Suggest she go back on 10- 15 mg/ day. Try tapering slowly- by 5 mg/ day/ week. See if that is easier. We will help set up a slower taper if necessary. She can use Advil or other otc pain medicines if needed.

## 2022-06-08 ENCOUNTER — Ambulatory Visit (HOSPITAL_COMMUNITY)
Admission: RE | Admit: 2022-06-08 | Discharge: 2022-06-08 | Disposition: A | Discharge status: Home / Self Care | Payer: BC Managed Care – PPO | Source: Ambulatory Visit | Attending: Podiatry | Admitting: Podiatry

## 2022-06-08 DIAGNOSIS — R269 Unspecified abnormalities of gait and mobility: Secondary | ICD-10-CM | POA: Diagnosis not present

## 2022-06-08 DIAGNOSIS — M25672 Stiffness of left ankle, not elsewhere classified: Secondary | ICD-10-CM | POA: Diagnosis not present

## 2022-06-08 DIAGNOSIS — M722 Plantar fascial fibromatosis: Secondary | ICD-10-CM | POA: Diagnosis not present

## 2022-06-08 DIAGNOSIS — M62572 Muscle wasting and atrophy, not elsewhere classified, left ankle and foot: Secondary | ICD-10-CM | POA: Diagnosis not present

## 2022-06-08 DIAGNOSIS — M25473 Effusion, unspecified ankle: Secondary | ICD-10-CM | POA: Diagnosis not present

## 2022-06-09 ENCOUNTER — Telehealth: Payer: Self-pay | Admitting: *Deleted

## 2022-06-09 NOTE — Telephone Encounter (Signed)
Patient is calling for Vascular results. Please advise.

## 2022-06-10 ENCOUNTER — Encounter: Payer: Self-pay | Admitting: Podiatry

## 2022-06-10 ENCOUNTER — Other Ambulatory Visit: Payer: Self-pay | Admitting: Podiatry

## 2022-06-10 DIAGNOSIS — I872 Venous insufficiency (chronic) (peripheral): Secondary | ICD-10-CM

## 2022-06-21 DIAGNOSIS — M62572 Muscle wasting and atrophy, not elsewhere classified, left ankle and foot: Secondary | ICD-10-CM | POA: Diagnosis not present

## 2022-06-21 DIAGNOSIS — R269 Unspecified abnormalities of gait and mobility: Secondary | ICD-10-CM | POA: Diagnosis not present

## 2022-06-21 DIAGNOSIS — M722 Plantar fascial fibromatosis: Secondary | ICD-10-CM | POA: Diagnosis not present

## 2022-06-21 DIAGNOSIS — M25672 Stiffness of left ankle, not elsewhere classified: Secondary | ICD-10-CM | POA: Diagnosis not present

## 2022-06-24 NOTE — Telephone Encounter (Signed)
Can someone please follow up on this vascular referral? Thanks.

## 2022-06-29 DIAGNOSIS — R269 Unspecified abnormalities of gait and mobility: Secondary | ICD-10-CM | POA: Diagnosis not present

## 2022-06-29 DIAGNOSIS — M62572 Muscle wasting and atrophy, not elsewhere classified, left ankle and foot: Secondary | ICD-10-CM | POA: Diagnosis not present

## 2022-06-29 DIAGNOSIS — M722 Plantar fascial fibromatosis: Secondary | ICD-10-CM | POA: Diagnosis not present

## 2022-06-29 DIAGNOSIS — M25672 Stiffness of left ankle, not elsewhere classified: Secondary | ICD-10-CM | POA: Diagnosis not present

## 2022-06-30 ENCOUNTER — Telehealth: Payer: Self-pay | Admitting: Internal Medicine

## 2022-06-30 DIAGNOSIS — J4550 Severe persistent asthma, uncomplicated: Secondary | ICD-10-CM

## 2022-06-30 DIAGNOSIS — M8580 Other specified disorders of bone density and structure, unspecified site: Secondary | ICD-10-CM

## 2022-06-30 NOTE — Telephone Encounter (Signed)
Called patient and she had a few questions.  Pt states she is wondering about her calcium. She states she is taking OTC Calcium '600mg'$  + vit D.   Patient is wondering is this is enough calcium for her to be taking! She is also wondering about her bone scan too?  Refills will be sent in when I get new coupon card.   Please advise sir

## 2022-07-01 ENCOUNTER — Encounter: Payer: Self-pay | Admitting: Internal Medicine

## 2022-07-01 DIAGNOSIS — M8580 Other specified disorders of bone density and structure, unspecified site: Secondary | ICD-10-CM | POA: Insufficient documentation

## 2022-07-01 DIAGNOSIS — L669 Cicatricial alopecia, unspecified: Secondary | ICD-10-CM | POA: Diagnosis not present

## 2022-07-01 DIAGNOSIS — L659 Nonscarring hair loss, unspecified: Secondary | ICD-10-CM | POA: Diagnosis not present

## 2022-07-01 MED ORDER — ALBUTEROL SULFATE HFA 108 (90 BASE) MCG/ACT IN AERS: INHALATION_SPRAY | RESPIRATORY_TRACT | 0 refills | Status: DC

## 2022-07-01 MED ORDER — CALTRATE 600+D PLUS MINERALS 600-800 MG-UNIT PO CHEW: 1.0000 | CHEWABLE_TABLET | Freq: Two times a day (BID) | ORAL | Status: AC

## 2022-07-01 MED ORDER — TRELEGY ELLIPTA 100-62.5-25 MCG/ACT IN AEPB: 1.0000 | INHALATION_SPRAY | Freq: Every day | RESPIRATORY_TRACT | 11 refills | Status: DC

## 2022-07-01 NOTE — Telephone Encounter (Signed)
As stated when we resulted the bone density test- she needs to contact her PCP about the imaging and calcium management issues.

## 2022-07-01 NOTE — Telephone Encounter (Signed)
Called and spoke with pt letting her know that April sent in Rx for Trelegy to pharmacy and stated to her that I could see information documented about a copay card that is shown on the Rx that had been sent to the pharmacy.  Stated to pt that Dr. Annamaria Boots stated in the result from the bone density test that she would need to contact PCP to have them further manage the calcium.  Routing this to PCP. Please advise.   Deneise Lever, MD  05/27/2022  1:55 PM EDT     Bone Density is low, with moderate risk for fracture. Please discuss this with your primary care provider.

## 2022-07-02 ENCOUNTER — Encounter: Payer: BC Managed Care – PPO | Admitting: Vascular Surgery

## 2022-07-06 DIAGNOSIS — R269 Unspecified abnormalities of gait and mobility: Secondary | ICD-10-CM | POA: Diagnosis not present

## 2022-07-06 DIAGNOSIS — M722 Plantar fascial fibromatosis: Secondary | ICD-10-CM | POA: Diagnosis not present

## 2022-07-06 DIAGNOSIS — M25672 Stiffness of left ankle, not elsewhere classified: Secondary | ICD-10-CM | POA: Diagnosis not present

## 2022-07-06 DIAGNOSIS — M62572 Muscle wasting and atrophy, not elsewhere classified, left ankle and foot: Secondary | ICD-10-CM | POA: Diagnosis not present

## 2022-07-07 DIAGNOSIS — Z01419 Encounter for gynecological examination (general) (routine) without abnormal findings: Secondary | ICD-10-CM | POA: Diagnosis not present

## 2022-07-07 DIAGNOSIS — Z1231 Encounter for screening mammogram for malignant neoplasm of breast: Secondary | ICD-10-CM | POA: Diagnosis not present

## 2022-07-07 DIAGNOSIS — Z6830 Body mass index (BMI) 30.0-30.9, adult: Secondary | ICD-10-CM | POA: Diagnosis not present

## 2022-07-07 LAB — HM MAMMOGRAPHY

## 2022-07-15 DIAGNOSIS — L659 Nonscarring hair loss, unspecified: Secondary | ICD-10-CM | POA: Diagnosis not present

## 2022-07-15 DIAGNOSIS — M722 Plantar fascial fibromatosis: Secondary | ICD-10-CM | POA: Diagnosis not present

## 2022-07-15 DIAGNOSIS — L905 Scar conditions and fibrosis of skin: Secondary | ICD-10-CM | POA: Diagnosis not present

## 2022-07-15 DIAGNOSIS — M62572 Muscle wasting and atrophy, not elsewhere classified, left ankle and foot: Secondary | ICD-10-CM | POA: Diagnosis not present

## 2022-07-15 DIAGNOSIS — R269 Unspecified abnormalities of gait and mobility: Secondary | ICD-10-CM | POA: Diagnosis not present

## 2022-07-15 DIAGNOSIS — M25672 Stiffness of left ankle, not elsewhere classified: Secondary | ICD-10-CM | POA: Diagnosis not present

## 2022-07-20 ENCOUNTER — Encounter: Payer: Self-pay | Admitting: Vascular Surgery

## 2022-07-20 ENCOUNTER — Ambulatory Visit (INDEPENDENT_AMBULATORY_CARE_PROVIDER_SITE_OTHER): Payer: BC Managed Care – PPO | Admitting: Vascular Surgery

## 2022-07-20 VITALS — BP 137/92 | HR 62 | Temp 98.1°F | Resp 14 | Ht 67.0 in | Wt 190.0 lb

## 2022-07-20 DIAGNOSIS — M7989 Other specified soft tissue disorders: Secondary | ICD-10-CM

## 2022-07-20 DIAGNOSIS — I872 Venous insufficiency (chronic) (peripheral): Secondary | ICD-10-CM

## 2022-07-20 NOTE — Progress Notes (Signed)
Patient name: [name] MRN: [id number] DOB: 1966-08-06 Sex: female  REASON FOR CONSULT: Leg swelling/venous insufficeincy  HPI: [name] is a 56 y.o. female, with history of left leg DVT in 2005, diabetes, hyperlipidemia that presents for evaluation of leg swelling and venous insufficiency.  She states she has been dealing with leg swelling for several years.  This is worse in the left leg.  She is on her feet for long periods of time during the day working at [employer] on the floor.  No previous venous interventions.  She is wearing knee-high compression stockings but finds them uncomfortable.  Past Medical History:  Diagnosis Date   Allergic rhinitis, cause unspecified    Allergy    Asthma    Clotting disorder ([place])    DVT 2005   Diabetes type 2, controlled ([place])    pt denies- no meds- Pre DM-    History of DVT (deep vein thrombosis) 2005   Pt reports blood clot below knee ?unsure which leg.   Hyperlipidemia    Nasal polyps    Osteopenia    Unspecified essential hypertension     Past Surgical History:  Procedure Laterality Date   COLONOSCOPY     LASIK Bilateral 2008   NASAL SINUS SURGERY  4259   Dr Erik Obey   OVARIAN CYST REMOVAL  02/2016   pt reported-- Dr Philis Pique   POLYPECTOMY     UTERINE FIBROID SURGERY  2005    Family History  Problem Relation Age of Onset   Cancer Mother 74       ?carcinoid tumor (had surgical removal)   Diabetes Mother        borderline   Stomach cancer Mother    Liver cancer Mother    Heart disease Father        died 25, from MI, had hx of ETOH abuse and liver disease   Thyroid cancer Sister        (diagnosed at 69) half sister (same dad)    Diabetes type II Brother    Kidney disease Neg Hx    Hypertension Neg Hx    Hyperlipidemia Neg Hx    Colon cancer Neg Hx    Esophageal cancer Neg Hx    Rectal cancer Neg Hx    Colon polyps Neg Hx     SOCIAL HISTORY: Social History   Socioeconomic History   Marital status:  Married    Spouse name: Not on file   Number of children: Not on file   Years of education: Not on file   Highest education level: Not on file  Occupational History   Occupation: Freight forwarder [employer]  Tobacco Use   Smoking status: Never   Smokeless tobacco: Never  Vaping Use   Vaping Use: Never used  Substance and Sexual Activity   Alcohol use: Yes    Alcohol/week: 0.0 standard drinks of alcohol    Comment: 1 glass of wine per year   Drug use: No   Sexual activity: Yes    Birth control/protection: None  Other Topics Concern   Not on file  Social History Narrative   Married   No children   No pets   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     Social Determinants of Health   Financial Resource Strain: Not on file  Food Insecurity: Not on file  Transportation Needs:  Not on file  Physical Activity: Not on file  Stress: Not on file  Social Connections: Not on file  Intimate Partner Violence: Not on file    Allergies  Allergen Reactions   Aspirin     wheezing   Latex Hives   Penicillins Hives   Shellfish Allergy    Shellfish-Derived Products Other (See Comments)    Current Outpatient Medications  Medication Sig Dispense Refill   albuterol (PROVENTIL) (2.5 MG/3ML) 0.083% nebulizer solution Take 2.5 mg by nebulization every 4 (four) hours as needed for wheezing or shortness of breath.     albuterol (VENTOLIN HFA) 108 (90 Base) MCG/ACT inhaler INHALE 2 PUFFS BY MOUTH EVERY 6 HOURS AS NEEDED FOR WHEEZING FOR SHORTNESS OF BREATH 27 g 0   Calcium Carbonate-Vit D-Min (CALTRATE 600+D PLUS MINERALS) 600-800 MG-UNIT CHEW Chew 1 tablet by mouth in the morning and at bedtime. 60 tablet    cetirizine (ZYRTEC) 10 MG tablet Take 10 mg by mouth daily.     COVID-19 mRNA bivalent vaccine, Moderna, (MODERNA COVID-19 BIVAL BOOSTER) 50 MCG/0.5ML injection Inject into the muscle. 0.5 mL 0   Dupilumab  (DUPIXENT) 300 MG/2ML SOPN Inject 300 mg into the skin every 14 (fourteen) days. 4 mL 5   fluticasone (FLONASE) 50 MCG/ACT nasal spray 2 sprays each nostril daily 16 g 12   Fluticasone-Umeclidin-Vilant (TRELEGY ELLIPTA) 100-62.5-25 MCG/ACT AEPB Inhale 1 puff into the lungs daily. 60 each 11   furosemide (LASIX) 20 MG tablet Take 20 mg by mouth daily as needed for edema.     lisinopril (ZESTRIL) 2.5 MG tablet Take 1 tablet by mouth once daily 90 tablet 0   predniSONE (DELTASONE) 10 MG tablet Use sparingly as directed 200 tablet 0   Prenatal Vit-Fe Sulfate-FA (PRENATAL VITAMIN PO) Take 1 capsule by mouth daily.     trimethoprim-polymyxin b (POLYTRIM) ophthalmic solution Place 1 drop into the right eye every 4 (four) hours. 10 mL 0   No current facility-administered medications for this visit.    REVIEW OF SYSTEMS:  '[X]'$  denotes positive finding, '[ ]'$  denotes negative finding Cardiac  Comments:  Chest pain or chest pressure:    Shortness of breath upon exertion:    Short of breath when lying flat:    Irregular heart rhythm:        Vascular    Pain in calf, thigh, or hip brought on by ambulation:    Pain in feet at night that wakes you up from your sleep:     Blood clot in your veins:    Leg swelling:  x       Pulmonary    Oxygen at home:    Productive cough:     Wheezing:         Neurologic    Sudden weakness in arms or legs:     Sudden numbness in arms or legs:     Sudden onset of difficulty speaking or slurred speech:    Temporary loss of vision in one eye:     Problems with dizziness:         Gastrointestinal    Blood in stool:     Vomited blood:         Genitourinary    Burning when urinating:     Blood in urine:        Psychiatric    Major depression:         Hematologic    Bleeding problems:    Problems with blood clotting  too easily:        Skin    Rashes or ulcers:        Constitutional    Fever or chills:      PHYSICAL EXAM: There were no vitals filed  for this visit.  GENERAL: The patient is a well-nourished female, in no acute distress. The vital signs are documented above. CARDIAC: There is a regular rate and rhythm.  VASCULAR:  Bilateral femoral pulses palpable Bilateral DP PT pulses palpable Notable spider veins around both knees No large lower extremity varicosities PULMONARY: No respiratory distress. ABDOMEN: Soft and non-tender. MUSCULOSKELETAL: There are no major deformities or cyanosis. NEUROLOGIC: No focal weakness or paresthesias are detected. SKIN: There are no ulcers or rashes noted. PSYCHIATRIC: The patient has a normal affect.  DATA:   Lower Venous Reflux Study   Patient Name:  SAHIAN KERNEY  Date of Exam:   06/08/2022  Medical Rec #: [id number]          Accession #:    [id number]  Date of Birth: [birth year]         Patient Gender: F  Patient Age:   20 years  Exam Location:  [facility]  Procedure:      VAS [place] LOWER EXTREMITY VENOUS REFLUX  Referring Phys: Celesta Gentile    ---------------------------------------------------------------------------  -----     Indications: Swelling.       Limitations: Pain/tenseness.     Comparison Study: 09/16/2016: No evidence of deep vein thrombosis  bilaterally.                    Reflux noted in the right common femoral vein. Reflux  noted in                    the left common femoral vein and popliteal vein.   Performing Technologist: Ivan Croft      Examination Guidelines: A complete evaluation includes B-mode imaging,  spectral  Doppler, color Doppler, and power Doppler as needed of all accessible  portions  of each vessel. Bilateral testing is considered an integral part of a  complete  examination. Limited examinations for reoccurring indications may be  performed  as noted. The reflux portion of the exam is performed with the patient in  reverse Trendelenburg.  Significant venous reflux is defined as >500 ms in the superficial  venous  system, and >1 second in the deep venous system.      Venous Reflux Times  +------------------------+---------+------+-----------+------------+-------  -+  RIGHT                   Reflux NoRefluxReflux TimeDiameter  cmsComments                                    Yes                                    +------------------------+---------+------+-----------+------------+-------  -+  CFV                     no                                               +------------------------+---------+------+-----------+------------+-------  -+  FV mid                  no                                               +------------------------+---------+------+-----------+------------+-------  -+  Popliteal               no                                               +------------------------+---------+------+-----------+------------+-------  -+  GSV at [facility]              no                            0.54               +------------------------+---------+------+-----------+------------+-------  -+  GSV prox thigh          no                            0.38               +------------------------+---------+------+-----------+------------+-------  -+  GSV mid thigh           no                            0.35               +------------------------+---------+------+-----------+------------+-------  -+  GSV dist thigh          no                            0.39               +------------------------+---------+------+-----------+------------+-------  -+  GSV at knee             no                            0.32               +------------------------+---------+------+-----------+------------+-------  -+  GSV prox calf           no                            0.25               +------------------------+---------+------+-----------+------------+-------  -+  SSV Pop Fossa           no                             0.17               +------------------------+---------+------+-----------+------------+-------  -+  SSV prox calf           no  0.19               +------------------------+---------+------+-----------+------------+-------  -+  SSV mid calf            no                            0.17               +------------------------+---------+------+-----------+------------+-------  -+  Proximal calf perforator          yes    >500 ms                         +------------------------+---------+------+-----------+------------+-------  -+      +--------------+---------+------+-----------+------------+--------+  LEFT          Reflux NoRefluxReflux TimeDiameter cmsComments                          Yes                                   +--------------+---------+------+-----------+------------+--------+  CFV           no                                              +--------------+---------+------+-----------+------------+--------+  FV mid        no                                              +--------------+---------+------+-----------+------------+--------+  Popliteal               yes   >1 second                       +--------------+---------+------+-----------+------------+--------+  GSV at [facility]    no                            0.54              +--------------+---------+------+-----------+------------+--------+  GSV prox thighno                            0.38              +--------------+---------+------+-----------+------------+--------+  GSV mid thigh no                            0.33              +--------------+---------+------+-----------+------------+--------+  GSV dist thighno                            0.42              +--------------+---------+------+-----------+------------+--------+  GSV at knee   no                            0.37               +--------------+---------+------+-----------+------------+--------+  GSV prox calf no                            0.34              +--------------+---------+------+-----------+------------+--------+  SSV Pop Fossa no                            0.11              +--------------+---------+------+-----------+------------+--------+  SSV prox calf no                            0.21              +--------------+---------+------+-----------+------------+--------+  SSV mid calf  no                            0.15              +--------------+---------+------+-----------+------------+--------+           Summary:  Right:  - No evidence of deep vein thrombosis seen in the right lower extremity,  from the common femoral through the popliteal veins.  - No evidence of superficial venous thrombosis in the right lower  extremity.     - Venous reflux is noted in the right perforator vein (proximal calf).     Left:  - No evidence of deep vein thrombosis seen in the left lower extremity,  from the common femoral through the popliteal veins.  - No evidence of superficial venous thrombosis in the left lower  extremity.     - Venous reflux is noted in the left popliteal vein.   Assessment/Plan:  56 year old female presents for evaluation of leg swelling and for further evaluation of possible underlying venous insufficiency.  Her symptoms are worse in the left leg and discussed after review of her reflux study she has no evidence of DVT but does have deep venous reflux in the left popliteal vein.  Discussed this is not amendable to laser ablation or other surgical intervention.  I have recommended conservative measures with exercise, elevation and compression stockings.  She can follow-up with me as needed.  This may be related to her previous DVT in 2005 with some evidence of post thrombotic syndrome.  We will get her sized for knee-high medical grade compression stockings  today and will do 15 to 20 mmHg to hopefully help her tolerate wearing them.   Marty Heck, MD [facility] Office: [phone]

## 2022-07-27 ENCOUNTER — Other Ambulatory Visit: Payer: Self-pay | Admitting: Internal Medicine

## 2022-07-27 DIAGNOSIS — R269 Unspecified abnormalities of gait and mobility: Secondary | ICD-10-CM | POA: Diagnosis not present

## 2022-07-27 DIAGNOSIS — M25672 Stiffness of left ankle, not elsewhere classified: Secondary | ICD-10-CM | POA: Diagnosis not present

## 2022-07-27 DIAGNOSIS — J4550 Severe persistent asthma, uncomplicated: Secondary | ICD-10-CM

## 2022-07-27 DIAGNOSIS — M722 Plantar fascial fibromatosis: Secondary | ICD-10-CM | POA: Diagnosis not present

## 2022-07-27 DIAGNOSIS — M62572 Muscle wasting and atrophy, not elsewhere classified, left ankle and foot: Secondary | ICD-10-CM | POA: Diagnosis not present

## 2022-07-27 DIAGNOSIS — J339 Nasal polyp, unspecified: Secondary | ICD-10-CM

## 2022-08-03 DIAGNOSIS — R269 Unspecified abnormalities of gait and mobility: Secondary | ICD-10-CM | POA: Diagnosis not present

## 2022-08-03 DIAGNOSIS — M722 Plantar fascial fibromatosis: Secondary | ICD-10-CM | POA: Diagnosis not present

## 2022-08-03 DIAGNOSIS — M25672 Stiffness of left ankle, not elsewhere classified: Secondary | ICD-10-CM | POA: Diagnosis not present

## 2022-08-03 DIAGNOSIS — M62572 Muscle wasting and atrophy, not elsewhere classified, left ankle and foot: Secondary | ICD-10-CM | POA: Diagnosis not present

## 2022-08-12 ENCOUNTER — Telehealth: Payer: Self-pay | Admitting: Internal Medicine

## 2022-08-12 NOTE — Telephone Encounter (Signed)
Fine to give coupon

## 2022-08-12 NOTE — Telephone Encounter (Signed)
Called and spoke with pt letting her know that [place] said it was okay to provide her a coupon and pt requested to have it placed in the mail. Coupon placed in mail for pt. Nothing further needed.

## 2022-08-12 NOTE — Telephone Encounter (Signed)
Dr. Annamaria Boots, please advise if you are okay with [place] giving pt a coupon.

## 2022-08-24 DIAGNOSIS — M62572 Muscle wasting and atrophy, not elsewhere classified, left ankle and foot: Secondary | ICD-10-CM | POA: Diagnosis not present

## 2022-08-24 DIAGNOSIS — R269 Unspecified abnormalities of gait and mobility: Secondary | ICD-10-CM | POA: Diagnosis not present

## 2022-08-24 DIAGNOSIS — M25672 Stiffness of left ankle, not elsewhere classified: Secondary | ICD-10-CM | POA: Diagnosis not present

## 2022-08-24 DIAGNOSIS — M722 Plantar fascial fibromatosis: Secondary | ICD-10-CM | POA: Diagnosis not present

## 2022-09-13 DIAGNOSIS — Z79899 Other long term (current) drug therapy: Secondary | ICD-10-CM | POA: Diagnosis not present

## 2022-09-13 DIAGNOSIS — L661 Lichen planopilaris: Secondary | ICD-10-CM | POA: Diagnosis not present

## 2022-09-20 ENCOUNTER — Encounter: Payer: Self-pay | Admitting: Family

## 2022-09-20 ENCOUNTER — Telehealth: Payer: Self-pay | Admitting: Family

## 2022-09-20 ENCOUNTER — Ambulatory Visit: Payer: BC Managed Care – PPO | Admitting: Family

## 2022-09-20 VITALS — BP 124/78 | HR 80 | Temp 98.1°F | Resp 18 | Ht 67.0 in | Wt 199.4 lb

## 2022-09-20 DIAGNOSIS — E119 Type 2 diabetes mellitus without complications: Secondary | ICD-10-CM | POA: Diagnosis not present

## 2022-09-20 DIAGNOSIS — J4540 Moderate persistent asthma, uncomplicated: Secondary | ICD-10-CM

## 2022-09-20 DIAGNOSIS — E785 Hyperlipidemia, unspecified: Secondary | ICD-10-CM | POA: Diagnosis not present

## 2022-09-20 DIAGNOSIS — M1990 Unspecified osteoarthritis, unspecified site: Secondary | ICD-10-CM

## 2022-09-20 DIAGNOSIS — Z1159 Encounter for screening for other viral diseases: Secondary | ICD-10-CM | POA: Diagnosis not present

## 2022-09-20 LAB — LIPID PANEL
Cholesterol: 174 mg/dL (ref 0–200)
HDL: 44 mg/dL (ref >39.00)
LDL Cholesterol: 96 mg/dL (ref 0–99)
NonHDL: 129.93
Total CHOL/HDL Ratio: 4
Triglycerides: 170 mg/dL — ABNORMAL HIGH (ref 0.0–149.0)
VLDL: 34 mg/dL (ref 0.0–40.0)

## 2022-09-20 LAB — COMPREHENSIVE METABOLIC PANEL
ALT: 17 U/L (ref 0–35)
AST: 19 U/L (ref 0–37)
Albumin: 3.8 g/dL (ref 3.5–5.2)
Alkaline Phosphatase: 98 U/L (ref 39–117)
BUN: 9 mg/dL (ref 6–23)
CO2: 30 mEq/L (ref 19–32)
Calcium: 9.7 mg/dL (ref 8.4–10.5)
Chloride: 102 mEq/L (ref 96–112)
Creatinine, Ser: 0.76 mg/dL (ref 0.40–1.20)
GFR: 88.56 mL/min (ref >60.00)
Glucose, Bld: 94 mg/dL (ref 70–99)
Potassium: 3.9 mEq/L (ref 3.5–5.1)
Sodium: 140 mEq/L (ref 135–145)
Total Bilirubin: 0.5 mg/dL (ref 0.2–1.2)
Total Protein: 6.5 g/dL (ref 6.0–8.3)

## 2022-09-20 LAB — MICROALBUMIN / CREATININE URINE RATIO
Creatinine,U: 128.2 mg/dL
Microalb Creat Ratio: 0.8 mg/g (ref 0.0–30.0)
Microalb, Ur: 1 mg/dL (ref 0.0–1.9)

## 2022-09-20 LAB — HEMOGLOBIN A1C: Hgb A1c MFr Bld: 6.8 % — ABNORMAL HIGH (ref 4.6–6.5)

## 2022-09-20 MED ORDER — LISINOPRIL 2.5 MG PO TABS: 2.5000 mg | ORAL_TABLET | Freq: Every day | ORAL | 1 refills | Status: DC

## 2022-09-20 NOTE — Assessment & Plan Note (Signed)
Hopefully A1C will trend downward since she has been needing less prednisone for asthma flares since starting Dupixent.

## 2022-09-20 NOTE — Telephone Encounter (Signed)
Please call [facility] on [address] in [place] and request dates of shingrix vaccine.   Also call Dr. Philis Pique and request last Pap and mammogram  Request DM eye from [facility] on [facility]

## 2022-09-20 NOTE — Assessment & Plan Note (Addendum)
Lab Results  Component Value Date   CHOL 152 01/21/2022   HDL 51.50 01/21/2022   LDLCALC 79 01/21/2022   TRIG 109.0 01/21/2022   CHOLHDL 3 01/21/2022   She stopped statin to see if this helped her joint pain- it did not. Will recheck lipids today.

## 2022-09-20 NOTE — Progress Notes (Signed)
Subjective:   By signing my name below, I, [name], attest that this documentation has been prepared under the direction and in the presence of Veronica Alar, NP. 09/20/2022    Patient ID: [name], female    DOB: 1966-10-19, 56 y.o.   MRN: [id number]  Chief Complaint  Patient presents with   Follow-up    Questions about Meds  Pain in Joints     HPI Patient is in today for a follow up visit.   Joint pain- She continues having joint pain. Her knees and hips have the worse pain. She finds her pain improves throughout the day after walking a while. She tested negative for Lupus during her last blood work. She reports her pain is worse while off prednisone. She is not taking prednisone at this time and last took it 6 weeks ago.   Abdominal pain- She reports having occasional episodes of pain in her left lower abdomen. She has normal bowel movements daily. She has no pain at this time.   Kidney- She is taking 2.5 mg lisinopril daily PO for preventative care. She is interested in a refill as well.   Breathing- Her breathing is stable at this time.  Immunizations/Screening- She reports receiving the shingles vaccines 3 years ago. She reports receiving the flu vaccine last week. She also received the new Covid-19 booster vaccine. She is not interested in hepatitis C screening during her next blood work.   GYN- She continues following up with her GYN regularly. She completes her pap smear regularly with her GYN specialist every 3 years.   Vision care- She is due for vision care. Her last appointment was last year.   Mammogram- She reports completing her mammogram on July, 2023.    Health Maintenance Due  Topic Date Due   Hepatitis C Screening  Never done   Zoster Vaccines- Shingrix (2 of 2) 11/06/2019   Diabetic kidney evaluation - Urine ACR  01/01/2021   PAP SMEAR-Modifier  03/03/2021   OPHTHALMOLOGY EXAM  11/06/2021   FOOT EXAM  03/24/2022   MAMMOGRAM   06/24/2022    Past Medical History:  Diagnosis Date   Allergic rhinitis, cause unspecified    Allergy    Asthma    Clotting disorder ([place])    DVT 2005   Diabetes type 2, controlled ([place])    pt denies- no meds- Pre DM-    History of DVT (deep vein thrombosis) 2005   Pt reports blood clot below knee ?unsure which leg.   Hyperlipidemia    Nasal polyps    Osteopenia    Unspecified essential hypertension     Past Surgical History:  Procedure Laterality Date   COLONOSCOPY     LASIK Bilateral 2008   NASAL SINUS SURGERY  0938   Dr Erik Obey   OVARIAN CYST REMOVAL  02/2016   pt reported-- Dr Philis Pique   POLYPECTOMY     UTERINE FIBROID SURGERY  2005    Family History  Problem Relation Age of Onset   Cancer Mother 12       ?carcinoid tumor (had surgical removal)   Diabetes Mother        borderline   Stomach cancer Mother    Liver cancer Mother    Heart disease Father        died 10, from MI, had hx of ETOH abuse and liver disease   Thyroid cancer Sister        (diagnosed at 13) half sister (same  dad)    Diabetes type II Brother    Kidney disease Neg Hx    Hypertension Neg Hx    Hyperlipidemia Neg Hx    Colon cancer Neg Hx    Esophageal cancer Neg Hx    Rectal cancer Neg Hx    Colon polyps Neg Hx     Social History   Socioeconomic History   Marital status: Married    Spouse name: Not on file   Number of children: Not on file   Years of education: Not on file   Highest education level: Not on file  Occupational History   Occupation: Freight forwarder [employer]  Tobacco Use   Smoking status: Never   Smokeless tobacco: Never  Vaping Use   Vaping Use: Never used  Substance and Sexual Activity   Alcohol use: Yes    Alcohol/week: 0.0 standard drinks of alcohol    Comment: 1 glass of wine per year   Drug use: No   Sexual activity: Yes    Birth control/protection: None  Other Topics Concern   Not on file  Social History Narrative   Married   No children   No pets    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     Social Determinants of Health   Financial Resource Strain: Not on file  Food Insecurity: Not on file  Transportation Needs: Not on file  Physical Activity: Not on file  Stress: Not on file  Social Connections: Not on file  Intimate Partner Violence: Not on file    Outpatient Medications Prior to Visit  Medication Sig Dispense Refill   albuterol (PROVENTIL) (2.5 MG/3ML) 0.083% nebulizer solution Take 2.5 mg by nebulization every 4 (four) hours as needed for wheezing or shortness of breath.     albuterol (VENTOLIN HFA) 108 (90 Base) MCG/ACT inhaler INHALE 2 PUFFS BY MOUTH EVERY 6 HOURS AS NEEDED FOR WHEEZING FOR SHORTNESS OF BREATH 27 g 0   Calcium Carbonate-Vit D-Min (CALTRATE 600+D PLUS MINERALS) 600-800 MG-UNIT CHEW Chew 1 tablet by mouth in the morning and at bedtime. 60 tablet    cetirizine (ZYRTEC) 10 MG tablet Take 10 mg by mouth daily.     COVID-19 mRNA bivalent vaccine, Moderna, (MODERNA COVID-19 BIVAL BOOSTER) 50 MCG/0.5ML injection Inject into the muscle. 0.5 mL 0   Dupilumab (DUPIXENT) 300 MG/2ML SOPN INJECT 300MG INTO THE SKIN EVERY 14 DAYS 4 mL 5   fluticasone (FLONASE) 50 MCG/ACT nasal spray 2 sprays each nostril daily 16 g 12   Fluticasone-Umeclidin-Vilant (TRELEGY ELLIPTA) 100-62.5-25 MCG/ACT AEPB Inhale 1 puff into the lungs daily. 60 each 11   furosemide (LASIX) 20 MG tablet Take 20 mg by mouth daily as needed for edema.     predniSONE (DELTASONE) 10 MG tablet Use sparingly as directed 200 tablet 0   Prenatal Vit-Fe Sulfate-FA (PRENATAL VITAMIN PO) Take 1 capsule by mouth daily.     trimethoprim-polymyxin b (POLYTRIM) ophthalmic solution Place 1 drop into the right eye every 4 (four) hours. 10 mL 0   lisinopril (ZESTRIL) 2.5 MG tablet Take 1 tablet by mouth once daily (Patient not taking: Reported on 09/20/2022) 90 tablet 0   No  facility-administered medications prior to visit.    Allergies  Allergen Reactions   Aspirin     wheezing   Latex Hives   Penicillins Hives   Shellfish Allergy    Shellfish-Derived Products Other (  See Comments)    Review of Systems  Gastrointestinal:  Positive for abdominal pain (lower left quadrant).  Musculoskeletal:  Positive for joint pain.       Objective:    Physical Exam Constitutional:      General: She is not in acute distress.    Appearance: Normal appearance. She is not ill-appearing.  HENT:     Head: Normocephalic and atraumatic.     Right Ear: External ear normal.     Left Ear: External ear normal.  Eyes:     Extraocular Movements: Extraocular movements intact.     Pupils: Pupils are equal, round, and reactive to light.  Cardiovascular:     Rate and Rhythm: Normal rate and regular rhythm.     Heart sounds: Normal heart sounds. No murmur heard.    No gallop.  Pulmonary:     Effort: Pulmonary effort is normal. No respiratory distress.     Breath sounds: Normal breath sounds. No wheezing or rales.     Comments: Decreased breath sounds to bases Abdominal:     General: Bowel sounds are normal. There is no distension.     Palpations: Abdomen is soft.     Tenderness: There is no abdominal tenderness. There is no guarding.  Skin:    General: Skin is warm and dry.  Neurological:     Mental Status: She is alert and oriented to person, place, and time.  Psychiatric:        Judgment: Judgment normal.     BP 124/78 (BP Location: Right Arm, Patient Position: Sitting, Cuff Size: Normal)   Pulse 80   Temp 98.1 F (36.7 C) (Oral)   Resp 18   Ht _0  (1.702 m)   Wt 199 lb 6.4 oz (90.4 kg)   LMP 06/22/2018   SpO2 99%   BMI 31.23 kg/m  Wt Readings from Last 3 Encounters:  09/20/22 199 lb 6.4 oz (90.4 kg)  07/20/22 190 lb (86.2 kg)  05/18/22 195 lb 3.2 oz (88.5 kg)       Assessment & Plan:   Problem List Items Addressed This Visit        Unprioritized   Osteoarthritis    Rheumatology work up negative for autoimmune etiology.  Recommended that she add tylenol 1035m bid.       Moderate persistent asthma    Much improved with addition of Dupixent by pulmonology.       Hyperlipidemia    Lab Results  Component Value Date   CHOL 152 01/21/2022   HDL 51.50 01/21/2022   LDLCALC 79 01/21/2022   TRIG 109.0 01/21/2022   CHOLHDL 3 01/21/2022  She stopped statin to see if this helped her joint pain- it did not. Will recheck lipids today.       Relevant Medications   lisinopril (ZESTRIL) 2.5 MG tablet   Other Relevant Orders   Lipid panel   Diabetes [place] 2, [place] [facility]    Hopefully A1C will trend downward since she has been needing less prednisone for asthma flares since starting Dupixent.       Relevant Medications   lisinopril (ZESTRIL) 2.5 MG tablet   Other Relevant Orders   Hemoglobin A1c   Comp Met (CMET)   Urine Microalbumin w/creat. ratio   Other Visit Diagnoses     Encounter for hepatitis C screening test for low risk patient       Relevant Orders   Hepatitis C Antibody  Meds ordered this encounter  Medications   lisinopril (ZESTRIL) 2.5 MG tablet    Sig: Take 1 tablet (2.5 mg total) by mouth daily.    Dispense:  90 tablet    Refill:  1    Order Specific Question:   Supervising Provider    Answer:   Penni Homans A [[id number]]    I, Nance Pear, NP, personally preformed the services described in this documentation.  All medical record entries made by the scribe were at my direction and in my presence.  I have reviewed the chart and discharge instructions (if applicable) and agree that the record reflects my personal performance and is accurate and complete. 09/20/2022   I,[name],acting as a scribe for Nance Pear, NP.,have documented all relevant documentation on the behalf of Nance Pear, NP,as directed by  Nance Pear, NP while in the  presence of Nance Pear, NP.   Nance Pear, NP

## 2022-09-20 NOTE — Assessment & Plan Note (Signed)
Much improved with addition of Dupixent by pulmonology.

## 2022-09-20 NOTE — Assessment & Plan Note (Signed)
Rheumatology work up negative for autoimmune etiology.  Recommended that she add tylenol '1000mg'$  bid.

## 2022-09-21 ENCOUNTER — Encounter: Payer: Self-pay | Admitting: *Deleted

## 2022-09-21 LAB — HEPATITIS C ANTIBODY: Hepatitis C Ab: NONREACTIVE

## 2022-09-21 NOTE — Telephone Encounter (Signed)
Shingles dates are in, other rec release forms have been faxed.

## 2022-09-22 ENCOUNTER — Encounter: Payer: Self-pay | Admitting: Family

## 2022-09-28 DIAGNOSIS — M25672 Stiffness of left ankle, not elsewhere classified: Secondary | ICD-10-CM | POA: Diagnosis not present

## 2022-09-28 DIAGNOSIS — M62572 Muscle wasting and atrophy, not elsewhere classified, left ankle and foot: Secondary | ICD-10-CM | POA: Diagnosis not present

## 2022-09-28 DIAGNOSIS — M722 Plantar fascial fibromatosis: Secondary | ICD-10-CM | POA: Diagnosis not present

## 2022-09-28 DIAGNOSIS — R269 Unspecified abnormalities of gait and mobility: Secondary | ICD-10-CM | POA: Diagnosis not present

## 2022-11-17 NOTE — Progress Notes (Signed)
Patient ID: [name], female    DOB: 1966-02-28, 56 y.o.   MRN: [id number] F followed for moderate persistent asthma with COPD (requiring frequent steroids , Failed Xolair, , hx nasal polyps, allergic rhinitis, hx DVT PFT- 09/09/11- FEV1 1.64/ 54%, FEV1/FVC 0.54, FEF25-75% 0.74/ 22%, insignif response to bronchodilator TLC 0.74, DLCO 72%. Moderate obstructive disease, mild restriction, mild reduction of DLCO. Office spirometry- mild obstructive airways disease. FEV1 1.95/73%, FVC 3.01/93%, FEV1/FVC 0.65, FEF 25-75% 1.26/38%.   -----------------------------------------------------------------   05/18/22- 56 year old female never smoker followed for severe persistent Asthma with COPD (frequent steroids, failed Xolair), Nasal Polyps/ Samter's Triad/Aspirin Allergy, allergic Rhinitis, history DVT -Albuterol HFA, neb albuterol/ atrovent, EpiPen, Trelegy100,  prednisone 10 mg, Dupixent, Zyrtec,  Flonase, nasal saline,  Covid vax-  4 Moderna Flu vax-had -----Patient needs refill on her prednisone and has started the Dupixent injections and feels like she is doing much better.  She feels better controlled having switched from [place] to [place].  Does use Trelegy.  Rescue inhaler most days.  Had gone almost 3 months off prednisone.  Noting muscle and joint aches which might be from steroid withdrawal as discussed, however her PCP is considering if her statin drug was causing the discomfort.  I discussed steroid use and withdrawal again. Sense of smell has improved indicating shrinkage of nasal polyps.  She will wait a couple months before returning to ENT.  11/18/28- 56 year old female never smoker followed for severe persistent Asthma with COPD (frequent steroids, failed Xolair), Nasal Polyps/ Samter's Triad/Aspirin Allergy, allergic Rhinitis, history DVT, DM2,  -Albuterol HFA, neb albuterol/ atrovent, EpiPen, Trelegy100,  prednisone 10 mg, Dupixent, Zyrtec,  Flonase, nasal saline,  Covid vax-   5 Moderna Flu vax-had -----Pt has recently come off prednisone and states she is aching all over We had a detailed discussion of adrenal insufficiency, steroid withdrawal, medical concerns and associated issues with asthma management.  She says she has had no prednisone in 3 months but wants to keep it available.  Dupixent has been a big help in controlling her asthma.  She has had aching after stopping the prednisone.  This was evaluated including testing negative for rheumatoid arthritis and lupus.  We will check a random cortisol level and update chest x-ray and PFT.   Review of Systems-See HPI     + = positive Constitutional:   No weight loss, night sweats,  Fevers, chills, fatigue, lassitude. HEENT:   No headaches,  Difficulty swallowing,  Tooth/dental problems,  Sore throat,                No sneezing, itching, ear ache,, + post nasal drip. + nasal congestion. CV:  No chest pain,  Orthopnea, PND, swelling in lower extremities, anasarca, dizziness, palpitations GI  No heartburn, indigestion, abdominal pain, nausea, vomiting, Resp:Usually some shortness of breath with exertion, not at rest.  No excess mucus,  productive cough,          + non-productive cough,  No coughing up of blood.   change in color of mucus.   +wheezing.   Skin: no rash or lesions. GU: . MS:  + joint pain, + swelling.   Neuro- nothing unusual Psych:  No change in mood or affect. No depression or anxiety.  No memory loss.  Objective:   Physical Exam General- Alert, Oriented, Affect-appropriate, Distress- none acute  Relaxed and conversational    + overweight, + pink cheeks/ steroid facies Skin- rash-none, lesions- none, excoriation- none Lymphadenopathy- none Head- atraumatic  Eyes- Gross vision intact, PERRLA, conjunctivae clear secretions            Ears- Hearing, canals normal            Nose- +Turbinate edema, No-Septal dev, mucus, , erosion, perforation. No definite polyps             Throat-  Mallampati III-IV, mucosa -not red , drainage- none, tonsils- atrophic Neck- flexible , trachea midline, no stridor , thyroid nl, carotid no bruit Chest - symmetrical excursion , unlabored           Heart/CV- RRR , no murmur , no gallop  , no rub, nl s1 s2                           - JVD- none , edema- none , stasis changes- none, varices- none           Lung-  Clear/ diminished, wheeze- none,  unlabored, cough- none , dullness-none, rub- none           Chest wall-  Abd-  Br/ Gen/ Rectal- Not done, not indicated Extrem- cyanosis- none, clubbing, none, atrophy- none, strength- nl.  Neuro- grossly intact to observation

## 2022-11-18 ENCOUNTER — Ambulatory Visit (INDEPENDENT_AMBULATORY_CARE_PROVIDER_SITE_OTHER): Payer: BC Managed Care – PPO | Admitting: Internal Medicine

## 2022-11-18 ENCOUNTER — Ambulatory Visit (INDEPENDENT_AMBULATORY_CARE_PROVIDER_SITE_OTHER): Payer: BC Managed Care – PPO

## 2022-11-18 ENCOUNTER — Encounter: Payer: Self-pay | Admitting: Internal Medicine

## 2022-11-18 VITALS — BP 114/78 | HR 72 | Ht 67.0 in | Wt 204.4 lb

## 2022-11-18 DIAGNOSIS — J4550 Severe persistent asthma, uncomplicated: Secondary | ICD-10-CM | POA: Diagnosis not present

## 2022-11-18 DIAGNOSIS — J4489 Other specified chronic obstructive pulmonary disease: Secondary | ICD-10-CM | POA: Diagnosis not present

## 2022-11-18 DIAGNOSIS — F1920 Other psychoactive substance dependence, uncomplicated: Secondary | ICD-10-CM

## 2022-11-18 DIAGNOSIS — J45909 Unspecified asthma, uncomplicated: Secondary | ICD-10-CM | POA: Diagnosis not present

## 2022-11-18 NOTE — Patient Instructions (Signed)
Order- pharmacy staff- please help renew [place] assistance  Order- lab- serum cortisol     dx Asthma severe persistent  Order- CXR     dx Asthma severe persistent  Order schedule PFT    dx Asthma severe persistent

## 2022-11-19 ENCOUNTER — Other Ambulatory Visit: Payer: Self-pay | Admitting: Internal Medicine

## 2022-11-19 ENCOUNTER — Other Ambulatory Visit: Payer: BC Managed Care – PPO

## 2022-11-19 DIAGNOSIS — J4550 Severe persistent asthma, uncomplicated: Secondary | ICD-10-CM

## 2022-11-19 DIAGNOSIS — Z79899 Other long term (current) drug therapy: Secondary | ICD-10-CM | POA: Diagnosis not present

## 2022-11-19 DIAGNOSIS — L659 Nonscarring hair loss, unspecified: Secondary | ICD-10-CM | POA: Diagnosis not present

## 2022-11-22 ENCOUNTER — Telehealth: Payer: Self-pay | Admitting: Internal Medicine

## 2022-11-22 MED ORDER — AZITHROMYCIN 250 MG PO TABS: ORAL_TABLET | ORAL | 0 refills | Status: DC

## 2022-11-22 NOTE — Telephone Encounter (Signed)
Called and spoke with pt letting her know recs per CY and she verbalized understanding. Rx sent to preferred pharmacy for pt. Nothing further needed.

## 2022-11-22 NOTE — Telephone Encounter (Signed)
I called the pt to give her results of her cxr- clear  She states that over the weekend developed slight sore throat and nasal congestion  She states she coughed up yellow sputum yesterday, but today it is clear  She is taking flonase, zyrtec, saline nasal spray  She is asking for something to be sent so she does not get worse  Please advise, thanks!  Allergies  Allergen Reactions   Aspirin     wheezing   Latex Hives   Penicillins Hives   Shellfish Allergy    Shellfish-Derived Products Other (See Comments)

## 2022-11-22 NOTE — Progress Notes (Signed)
Spoke with pt and notified of results per Dr. Young Pt verbalized understanding and denied any questions. 

## 2022-11-22 NOTE — Telephone Encounter (Signed)
Please send Zpak 250 mg, # 6, 2 today then one daily Thanks

## 2022-11-27 LAB — CORTISOL, FREE: Cortisol Free, Ser: 0.05 ug/dL

## 2022-11-29 ENCOUNTER — Telehealth: Payer: Self-pay | Admitting: Internal Medicine

## 2022-11-29 NOTE — Telephone Encounter (Signed)
Deneise Lever, MD 11/28/2022 11:50 AM EST Back to Top    Lab- free cortisol was within normal range for the time of day it was drawn.    Spoke with pt and notified of results per Dr. Annamaria Boots.

## 2022-12-01 ENCOUNTER — Encounter: Payer: Self-pay | Admitting: Internal Medicine

## 2022-12-01 NOTE — Assessment & Plan Note (Signed)
As discussed, I do not know if we will be able to wean her off systemic prednisone without Endocrinology support. Plan-random serum cortisol level (drawn this afternoon).

## 2022-12-01 NOTE — Assessment & Plan Note (Signed)
Significantly better control with Dupixent. Plan-pharmacy team to help her renew her manufacturer's assistance for Dupixent.

## 2022-12-17 IMAGING — CT CT CARDIAC CORONARY ARTERY CALCIUM SCORE
3 series · 14 of 20 positions shown, 15 images · non-contrast
Comparison: None.
COMPARISON: None.

Addendum:
EXAM:
OVER-READ INTERPRETATION  CT CHEST

The following report is an over-read performed by radiologist Dr.
Keothaile Mereyotlhe [REDACTED] on 05/08/2021. This
over-read does not include interpretation of cardiac or coronary
anatomy or pathology. The coronary calcium score interpretation by
the cardiologist is attached.
CLINICAL DATA: Cardiovascular Disease Risk stratification
Coronary Calcium Score
TECHNIQUE: A gated, non-contrast computed tomography scan of the heart was
performed using 3mm slice thickness. Axial images were analyzed on a
dedicated workstation. Calcium scoring of the coronary arteries was
performed using the Agatston method.

[Series 2: casc 3.0 bv41 2 bestsyst 29 % · axial · 0.43mm/px · z∈[-200,-138]mm · 4 of 37 slices shown, 5 images]
[im 8/37  vessel]
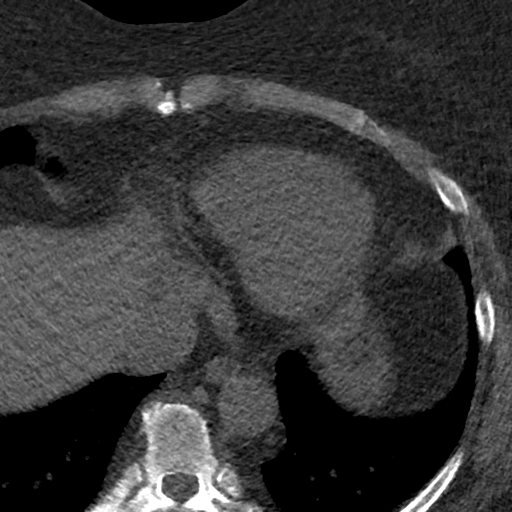
[im 8/37  lung]
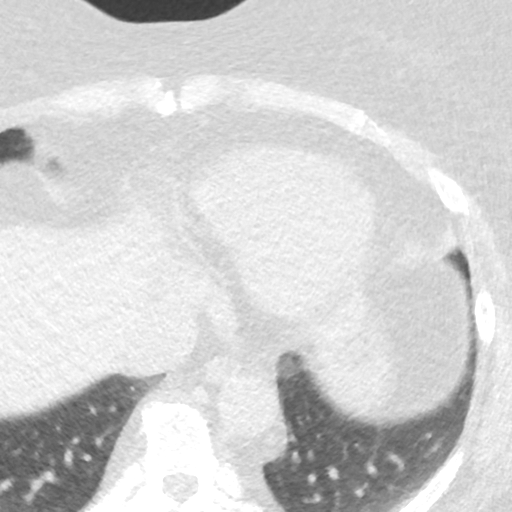
[im 15/37  vessel]
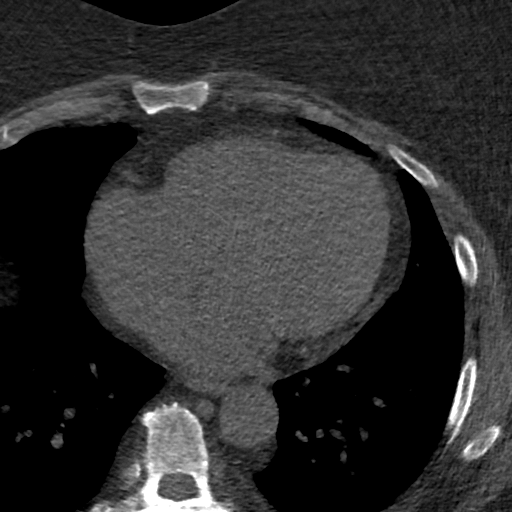
[im 22/37  vessel]
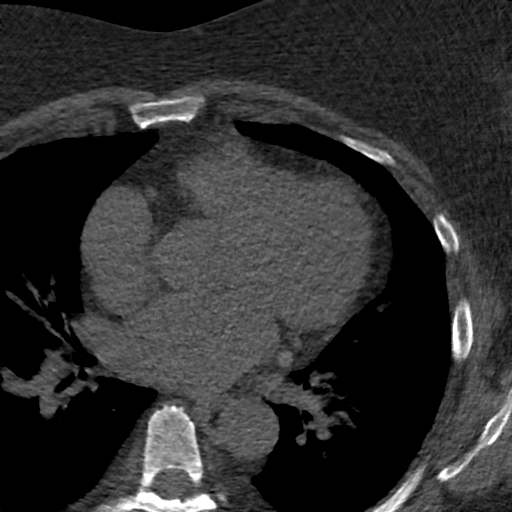
[im 29/37  vessel]
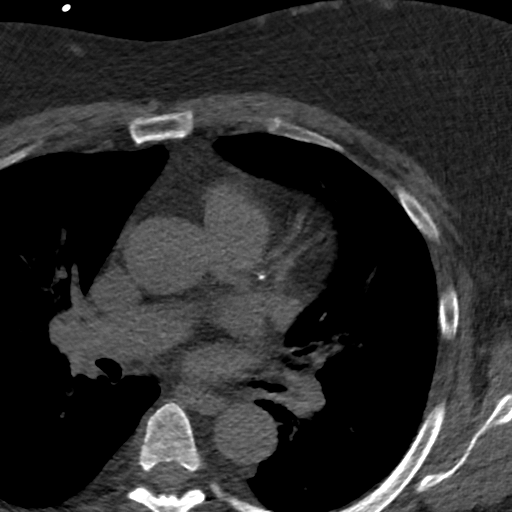

[Series 3: lung 29 % · axial · 0.67mm/px · z∈[-204,-132]mm · 5 of 37 slices shown]
[im 7/37  lung]
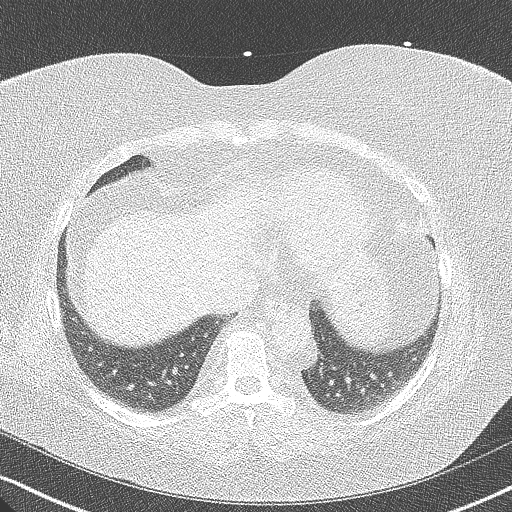
[im 13/37  lung]
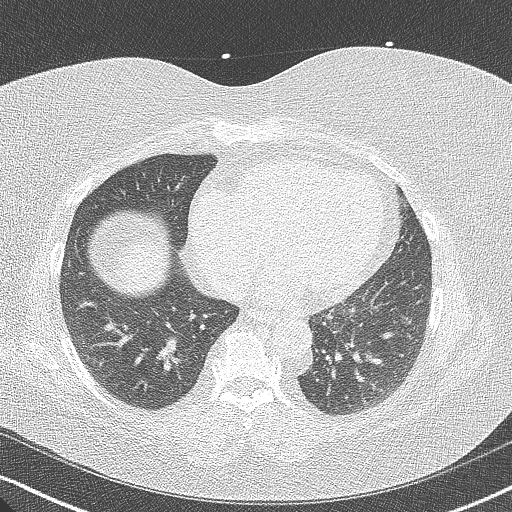
[im 19/37  lung]
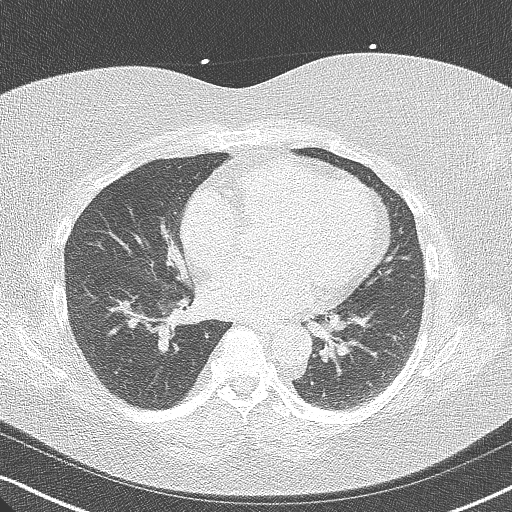
[im 25/37  lung]
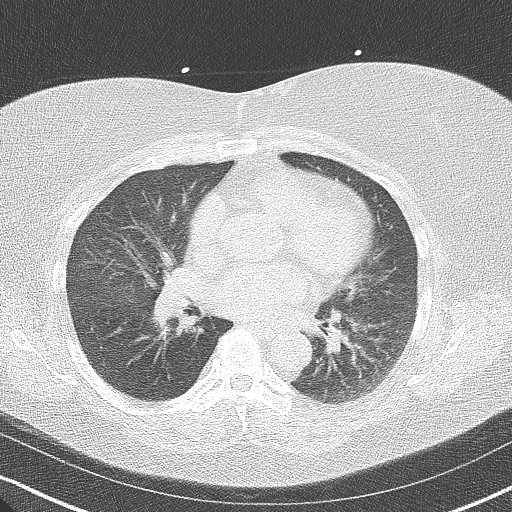
[im 31/37  lung]
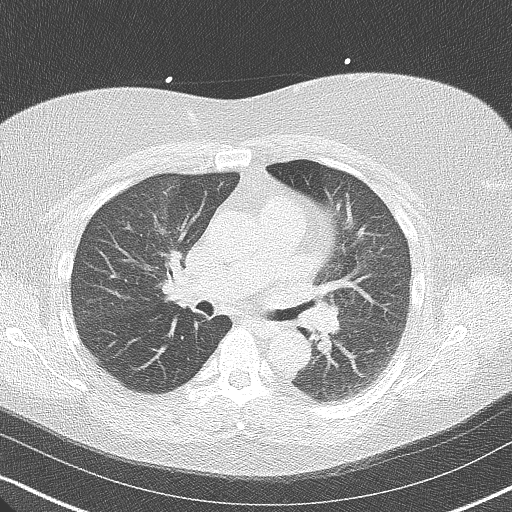

[Series 4: lung st 29 % · axial · 0.67mm/px · z∈[-204,-132]mm · 5 of 37 slices shown]
[im 7/37  lung]
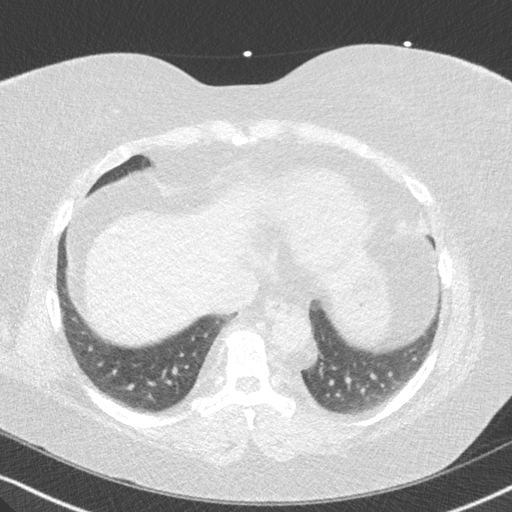
[im 13/37  lung]
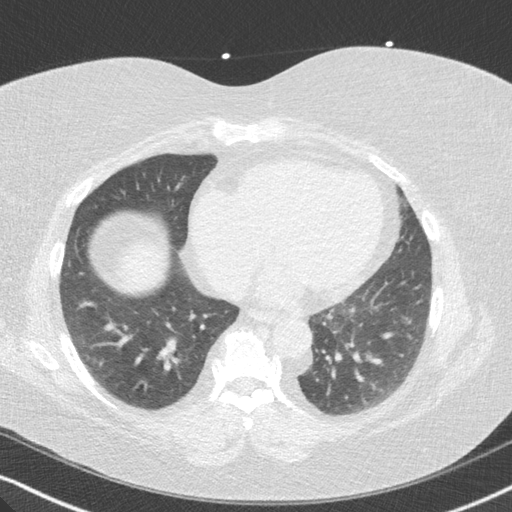
[im 19/37  lung]
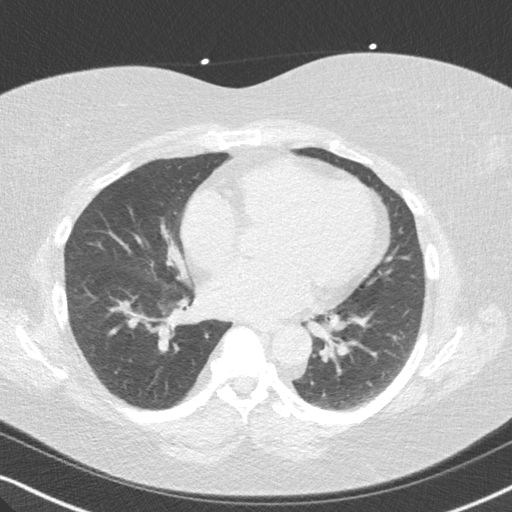
[im 25/37  lung]
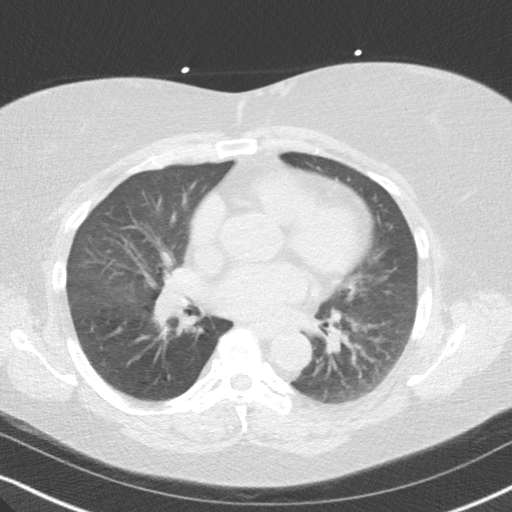
[im 31/37  lung]
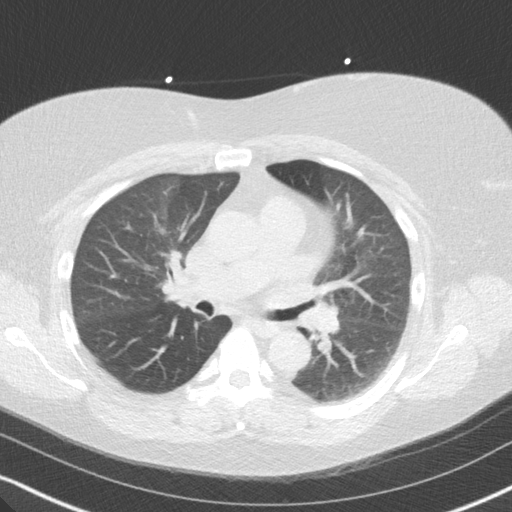

[14 of 20 positions shown; findings below may reference images not displayed]

FINDINGS: Within the visualized portions of the thorax there are no suspicious
appearing pulmonary nodules or masses, there is no acute
consolidative airspace disease, no pleural effusions, no
pneumothorax and no lymphadenopathy. Visualized portions of the
upper abdomen are unremarkable. There are no aggressive appearing
lytic or blastic lesions noted in the visualized portions of the
skeleton.
IMPRESSION: No significant incidental noncardiac findings are noted.
FINDINGS: Coronary arteries: Normal origins.

Coronary Calcium Score:

Left main: 0

Left anterior descending artery:

Left circumflex artery: 0

Right coronary artery: 0

Total:

Percentile: 89 th

Pericardium: Normal.

Ascending Aorta: Normal caliber.  3.4 cm

Non-cardiac: See separate report from [REDACTED].
IMPRESSION: Coronary calcium score of 22.4. This was 89 th percentile for age-,
race-, and sex-matched controls.



If CAC=0, it is reasonable to withhold statin therapy and reassess
in 5 to 10 years, as long as higher risk conditions are absent
(diabetes mellitus, family history of premature CHD in first degree
relatives (males <55 years; females <65 years), cigarette smoking,
or LDL >=190 mg/dL).

If CAC is 1 to 99, it is reasonable to initiate statin therapy for
patients >=55 years of age.

If CAC is >=100 or >=75th percentile, it is reasonable to initiate
statin therapy at any age.

Cardiology referral should be considered for patients with CAC
scores >=400 or >=75th percentile.

*4983 AHA/ACC/AACVPR/AAPA/ABC/OHASHI/LES/HILLMAN/Tiger/MIJANGOZ/NANTHA/QUEJ CHEN
Guideline on the Management of Blood Cholesterol: A Report of the
American College of Cardiology/American Heart Association Task Force
on Clinical Practice Guidelines. J Am Coll Cardiol.
8712;73(24):6005-6669.

*** End of Addendum ***
EXAM:
OVER-READ INTERPRETATION  CT CHEST

The following report is an over-read performed by radiologist Dr.
Keothaile Mereyotlhe [REDACTED] on 05/08/2021. This
over-read does not include interpretation of cardiac or coronary
anatomy or pathology. The coronary calcium score interpretation by
the cardiologist is attached.
FINDINGS: Within the visualized portions of the thorax there are no suspicious
appearing pulmonary nodules or masses, there is no acute
consolidative airspace disease, no pleural effusions, no
pneumothorax and no lymphadenopathy. Visualized portions of the
upper abdomen are unremarkable. There are no aggressive appearing
lytic or blastic lesions noted in the visualized portions of the
skeleton.
IMPRESSION: No significant incidental noncardiac findings are noted.

## 2022-12-21 ENCOUNTER — Ambulatory Visit: Payer: BC Managed Care – PPO | Admitting: Family

## 2022-12-23 DIAGNOSIS — L661 Lichen planopilaris: Secondary | ICD-10-CM | POA: Diagnosis not present

## 2022-12-23 DIAGNOSIS — Z79899 Other long term (current) drug therapy: Secondary | ICD-10-CM | POA: Diagnosis not present

## 2022-12-27 ENCOUNTER — Telehealth (INDEPENDENT_AMBULATORY_CARE_PROVIDER_SITE_OTHER): Payer: BC Managed Care – PPO | Admitting: Family

## 2022-12-27 DIAGNOSIS — E119 Type 2 diabetes mellitus without complications: Secondary | ICD-10-CM

## 2022-12-27 DIAGNOSIS — J4540 Moderate persistent asthma, uncomplicated: Secondary | ICD-10-CM | POA: Diagnosis not present

## 2022-12-27 DIAGNOSIS — D689 Coagulation defect, unspecified: Secondary | ICD-10-CM

## 2022-12-27 DIAGNOSIS — M2550 Pain in unspecified joint: Secondary | ICD-10-CM | POA: Insufficient documentation

## 2022-12-27 DIAGNOSIS — Z86718 Personal history of other venous thrombosis and embolism: Secondary | ICD-10-CM

## 2022-12-27 NOTE — Progress Notes (Signed)
MyChart Video Visit    Virtual Visit via Video Note   This visit type was conducted due to national recommendations for restrictions regarding the COVID-19 Pandemic (e.g. social distancing) in an effort to limit this patient's exposure and mitigate transmission in our community. This patient is at least at moderate risk for complications without adequate follow up. This format is felt to be most appropriate for this patient at this time. Physical exam was limited by quality of the video and audio technology used for the visit. CMA was able to get the patient set up on a video visit.  Patient location: Home Patient and provider in visit Provider location: Office  I discussed the limitations of evaluation and management by telemedicine and the availability of in person appointments. The patient expressed understanding and agreed to proceed.  Visit Date: 12/27/2022  Today's healthcare provider: Nance Pear, NP     Subjective:    Patient ID: [name], female    DOB: 15-Dec-1966, 57 y.o.   MRN: [id number]  Chief Complaint  Patient presents with   Diabetes    Follow up    Hypertension    Here for follow up   Joint Pain    Complains of joint pain     Patient is in today for a video visit.   Congestion: She reports developing rhinorrhea and congestion 1.5 weeks ago. She did not test for Covid-19. Her symptoms have slightly improved since then.   Joint pain: She continues having joint pain. Her joint pain improved while taking prednisone when managing her asthma. She has seen a rheumatologist and tested negative for lupus and arthritis. She is interested in following up with her rheumatologist specialist.   Asthma: Her asthma is doing well while taking Dupixent injections every 2 weeks.   Blood sugar: Her last A1c increased to 6.8. She is managing a healthy diet. She lost weight since her last visit. She has cut out most sugars including soft drinks from her diet.   Lab Results  Component Value Date   HGBA1C 6.8 (H) 09/20/2022    Past Medical History:  Diagnosis Date   Allergic rhinitis, cause unspecified    Allergy    Asthma    Clotting disorder ([place])    DVT 2005   Diabetes type 2, controlled ([place])    pt denies- no meds- Pre DM-    History of DVT (deep vein thrombosis) 2005   Pt reports blood clot below knee ?unsure which leg.   Hyperlipidemia    Nasal polyps    Osteopenia    Unspecified essential hypertension     Past Surgical History:  Procedure Laterality Date   COLONOSCOPY     LASIK Bilateral 2008   NASAL SINUS SURGERY  [id number]   Dr Erik Obey   OVARIAN CYST REMOVAL  02/2016   pt reported-- Dr Philis Pique   POLYPECTOMY     UTERINE FIBROID SURGERY  2005    Family History  Problem Relation Age of Onset   Cancer Mother 78       ?carcinoid tumor (had surgical removal)   Diabetes Mother        borderline   Stomach cancer Mother    Liver cancer Mother    Heart disease Father        died 95, from MI, had hx of ETOH abuse and liver disease   Thyroid cancer Sister        (diagnosed at 73) half sister (same dad)  Diabetes type II Brother    Kidney disease Neg Hx    Hypertension Neg Hx    Hyperlipidemia Neg Hx    Colon cancer Neg Hx    Esophageal cancer Neg Hx    Rectal cancer Neg Hx    Colon polyps Neg Hx     Social History   Socioeconomic History   Marital status: Married    Spouse name: Not on file   Number of children: Not on file   Years of education: Not on file   Highest education level: Not on file  Occupational History   Occupation: Freight forwarder [employer]  Tobacco Use   Smoking status: Never   Smokeless tobacco: Never  Vaping Use   Vaping Use: Never used  Substance and Sexual Activity   Alcohol use: Yes    Alcohol/week: 0.0 standard drinks of alcohol    Comment: 1 glass of wine per year   Drug use: No   Sexual activity: Yes    Birth control/protection: None  Other Topics Concern   Not on file  Social  History Narrative   Married   No children   No pets   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     Social Determinants of Health   Financial Resource Strain: Not on file  Food Insecurity: Not on file  Transportation Needs: Not on file  Physical Activity: Not on file  Stress: Not on file  Social Connections: Not on file  Intimate Partner Violence: Not on file    Outpatient Medications Prior to Visit  Medication Sig Dispense Refill   albuterol (PROVENTIL) (2.5 MG/3ML) 0.083% nebulizer solution Take 2.5 mg by nebulization every 4 (four) hours as needed for wheezing or shortness of breath.     albuterol (VENTOLIN HFA) 108 (90 Base) MCG/ACT inhaler INHALE 2 PUFFS BY MOUTH EVERY 6 HOURS AS NEEDED FOR WHEEZING FOR SHORTNESS OF BREATH 27 g 0   Calcium Carbonate-Vit D-Min (CALTRATE 600+D PLUS MINERALS) 600-800 MG-UNIT CHEW Chew 1 tablet by mouth in the morning and at bedtime. 60 tablet    cetirizine (ZYRTEC) 10 MG tablet Take 10 mg by mouth daily.     COVID-19 mRNA bivalent vaccine, Moderna, (MODERNA COVID-19 BIVAL BOOSTER) 50 MCG/0.5ML injection Inject into the muscle. 0.5 mL 0   Dupilumab (DUPIXENT) 300 MG/2ML SOPN INJECT '300MG'$  INTO THE SKIN EVERY 14 DAYS 4 mL 5   fluticasone (FLONASE) 50 MCG/ACT nasal spray 2 sprays each nostril daily 16 g 12   Fluticasone-Umeclidin-Vilant (TRELEGY ELLIPTA) 100-62.5-25 MCG/ACT AEPB Inhale 1 puff into the lungs daily. 60 each 11   furosemide (LASIX) 20 MG tablet Take 20 mg by mouth daily as needed for edema.     lisinopril (ZESTRIL) 2.5 MG tablet Take 1 tablet (2.5 mg total) by mouth daily. 90 tablet 1   Prenatal Vit-Fe Sulfate-FA (PRENATAL VITAMIN PO) Take 1 capsule by mouth daily.     trimethoprim-polymyxin b (POLYTRIM) ophthalmic solution Place 1 drop into the right eye every 4 (four) hours. 10 mL 0   azithromycin (ZITHROMAX) 250 MG tablet Take two today and then one  daily until finished. 6 tablet 0   predniSONE (DELTASONE) 10 MG tablet Use sparingly as directed (Patient not taking: Reported on 12/27/2022) 200 tablet 0   No facility-administered medications prior to visit.    Allergies  Allergen Reactions   Aspirin     wheezing  Latex Hives   Penicillins Hives   Shellfish Allergy    Shellfish-Derived Products Other (See Comments)    Review of Systems  HENT:  Positive for congestion.        (+)rhinorrhea   Musculoskeletal:  Positive for joint pain.       Objective:    Physical Exam Constitutional:      General: She is not in acute distress.    Appearance: Normal appearance. She is not ill-appearing.  HENT:     Head: Normocephalic and atraumatic.     Right Ear: External ear normal.     Left Ear: External ear normal.  Eyes:     Extraocular Movements: Extraocular movements intact.     Pupils: Pupils are equal, round, and reactive to light.  Cardiovascular:     Rate and Rhythm: Normal rate and regular rhythm.     Heart sounds: Normal heart sounds. No murmur heard.    No gallop.  Pulmonary:     Effort: Pulmonary effort is normal. No respiratory distress.     Breath sounds: Normal breath sounds. No wheezing or rales.  Skin:    General: Skin is warm and dry.  Neurological:     Mental Status: She is alert and oriented to person, place, and time.  Psychiatric:        Judgment: Judgment normal.     LMP 06/22/2018  Wt Readings from Last 3 Encounters:  11/18/22 204 lb 6.4 oz (92.7 kg)  09/20/22 199 lb 6.4 oz (90.4 kg)  07/20/22 190 lb (86.2 kg)       Assessment & Plan:  Controlled type 2 diabetes mellitus without complication, without long-term current use of insulin (HCC) -     Hemoglobin A1c; Future -     Basic metabolic panel; Future  Moderate persistent asthma without complication Assessment & Plan: Asthma is much improved on dupixent. This is being managed by pulmonology.   Polyarthralgia Assessment &  Plan: Uncontrolled, especially now off of prednisone.  I advised her to schedule follow up with rheumatology for re-evaluation.    Clotting disorder ([place]) -     Factor V Leiden; Future  History of DVT (deep vein thrombosis) Assessment & Plan: This was back in 2005. She completed 6 months of coumadin.  I don't have access to her hematology records from back then, but it is documented that they did not feel that she had a hypercoag state but it is also listed that she has factor V leiden. I will repeat this testing and go from there.  I am thinking she may need to be at least on aspirin therapy.       I discussed the assessment and treatment plan with the patient. The patient was provided an opportunity to ask questions and all were answered. The patient agreed with the plan and demonstrated an understanding of the instructions.   The patient was advised to call back or seek an in-person evaluation if the symptoms worsen or if the condition fails to improve as anticipated.  Nance Pear, NP [employer] [phone] (phone) [phone] (fax)  [place]    I,[name],acting as a scribe for Nance Pear, NP.,have documented all relevant documentation on the behalf of Nance Pear, NP,as directed by  Nance Pear, NP while in the presence of Nance Pear, NP.

## 2022-12-27 NOTE — Assessment & Plan Note (Signed)
This was back in 2005. She completed 6 months of coumadin.  I don't have access to her hematology records from back then, but it is documented that they did not feel that she had a hypercoag state but it is also listed that she has factor V leiden. I will repeat this testing and go from there.  I am thinking she may need to be at least on aspirin therapy.

## 2022-12-27 NOTE — Assessment & Plan Note (Signed)
Uncontrolled, especially now off of prednisone.  I advised her to schedule follow up with rheumatology for re-evaluation.

## 2022-12-27 NOTE — Assessment & Plan Note (Signed)
Asthma is much improved on dupixent. This is being managed by pulmonology.

## 2022-12-30 ENCOUNTER — Telehealth: Payer: Self-pay | Admitting: Internal Medicine

## 2022-12-30 ENCOUNTER — Other Ambulatory Visit: Payer: BC Managed Care – PPO

## 2022-12-30 ENCOUNTER — Telehealth: Payer: Self-pay

## 2022-12-30 ENCOUNTER — Other Ambulatory Visit: Payer: Self-pay

## 2022-12-30 DIAGNOSIS — J4550 Severe persistent asthma, uncomplicated: Secondary | ICD-10-CM

## 2022-12-30 DIAGNOSIS — J339 Nasal polyp, unspecified: Secondary | ICD-10-CM

## 2022-12-30 MED ORDER — PREDNISONE 10 MG PO TABS: ORAL_TABLET | ORAL | 0 refills | Status: DC

## 2022-12-30 NOTE — Telephone Encounter (Signed)
PT calling for med refill (Pred.)  Pharm: [facility] on [address]

## 2022-12-30 NOTE — Telephone Encounter (Signed)
Spoke with patient advised of Prednisone refills. Nothing further needed.

## 2022-12-30 NOTE — Telephone Encounter (Signed)
Spoke with patient she states she has received her last dupixent injection and is wanting to start to process to get more refills so she doesn't run out.   Devki please advise

## 2022-12-31 ENCOUNTER — Other Ambulatory Visit: Payer: BC Managed Care – PPO

## 2022-12-31 ENCOUNTER — Other Ambulatory Visit (INDEPENDENT_AMBULATORY_CARE_PROVIDER_SITE_OTHER): Payer: BC Managed Care – PPO

## 2022-12-31 DIAGNOSIS — D689 Coagulation defect, unspecified: Secondary | ICD-10-CM

## 2022-12-31 DIAGNOSIS — E119 Type 2 diabetes mellitus without complications: Secondary | ICD-10-CM

## 2022-12-31 LAB — BASIC METABOLIC PANEL
BUN: 12 mg/dL (ref 6–23)
CO2: 26 mEq/L (ref 19–32)
Calcium: 10 mg/dL (ref 8.4–10.5)
Chloride: 106 mEq/L (ref 96–112)
Creatinine, Ser: 0.77 mg/dL (ref 0.40–1.20)
GFR: 87.01 mL/min (ref >60.00)
Glucose, Bld: 92 mg/dL (ref 70–99)
Potassium: 3.9 mEq/L (ref 3.5–5.1)
Sodium: 142 mEq/L (ref 135–145)

## 2022-12-31 LAB — HEMOGLOBIN A1C: Hgb A1c MFr Bld: 6.4 % (ref 4.6–6.5)

## 2023-01-03 MED ORDER — DUPIXENT 300 MG/2ML ~~LOC~~ SOAJ: SUBCUTANEOUS | 5 refills | Status: DC

## 2023-01-03 NOTE — Telephone Encounter (Signed)
Refill sent for [place] to  [facility]  Dose: 300 mg SQ every 2 weeks  Last OV: 11/18/2022 Provider: Dr. Annamaria Boots  Next OV: 05/19/2023  Knox Saliva, PharmD, MPH, BCPS Clinical Pharmacist (Rheumatology and Pulmonology)

## 2023-01-08 LAB — FACTOR V LEIDEN

## 2023-01-25 ENCOUNTER — Encounter: Payer: Self-pay | Admitting: Internal Medicine

## 2023-01-25 ENCOUNTER — Ambulatory Visit (INDEPENDENT_AMBULATORY_CARE_PROVIDER_SITE_OTHER): Payer: BC Managed Care – PPO

## 2023-01-25 ENCOUNTER — Ambulatory Visit: Payer: BC Managed Care – PPO | Attending: Internal Medicine | Admitting: Internal Medicine

## 2023-01-25 ENCOUNTER — Ambulatory Visit: Payer: BC Managed Care – PPO

## 2023-01-25 VITALS — BP 130/86 | HR 80 | Resp 16 | Ht 67.0 in | Wt 201.0 lb

## 2023-01-25 DIAGNOSIS — M25562 Pain in left knee: Secondary | ICD-10-CM | POA: Diagnosis not present

## 2023-01-25 DIAGNOSIS — R768 Other specified abnormal immunological findings in serum: Secondary | ICD-10-CM | POA: Diagnosis not present

## 2023-01-25 DIAGNOSIS — M1990 Unspecified osteoarthritis, unspecified site: Secondary | ICD-10-CM | POA: Diagnosis not present

## 2023-01-25 DIAGNOSIS — M25561 Pain in right knee: Secondary | ICD-10-CM

## 2023-01-25 NOTE — Progress Notes (Signed)
Office Visit Note  Patient: [name]             Date of Birth: [birth year]           MRN: [id number]             PCP: [name], NP Referring: [name], NP Visit Date: 01/25/2023   Subjective:  Follow-up (Patient states joint pain. Patient states she has occasional swelling in left leg that can make it hard to walk.)   History of Present Illness: [name] is a 57 y.o. female here for follow up with ongoing joint and muscle pain and positive ANA.  Laboratory testing at our initial visit was negative for any specific antibody markers.  At the time was on steroids to had caveat for not seeing any peripheral joint synovitis.  Since that time she continues having joint pain and aches while off oral steroids.  Worst affected area is in the left leg and she occasionally sees swelling at the knee or distally.  No new skin rashes or other visible changes.  Previous HPI 11/26/21 [name] is a 57 y.o. female here for evaluation of body and muscle pains with positive ANA. She has  a history of adult onset moderate to severe persistent asthma on long-term treatment also requiring chronic, intermittent steroid exposure. Currently at day 2 of another treatment course. Muscle pains are new problem during approximately the past 1 year.  She notices pain in the upper and lower portions of the legs and feels like it is in the muscle often provoked after prolonged walking and standing all day for work.  Symptoms typically resolve after resting and sleeping improved by the next day.  She does not take any specific medications for this.  She feels there is an improvement with taking a hot shower.  She has morning stiffness lasting a few minutes each day.  She has some chronic swelling in the left leg distally longstanding for years and unremarkable on previous vascular evaluation.  Otherwise does not notice localized swelling in the affected areas.  She denies any  problems in her torso and upper extremities.  Symptoms are only occurring once she is off steroids for at least a month or 2 for her asthma and completely improved when she is taking steroids. Besides muscle pains she also notices dry eyes mouth without any specific injury or ulceration.  She has hot flashes and intermittent heat intolerance and is about 3 years into menopausal symptoms.  She has a history of unprovoked blood clot in the right leg in 2010 workup for antiphospholipid antibodies was negative. She had 2 early trimester miscarriages and no completed pregnancies. She denies any other problems with clots, bleeding, or bruising. She has generalized alopecia without any rashes or itching. She denies lymphadenopathy or raynaud's symptoms.   Labs reviewed 09/2021 ANA 1:160 speckled 1:40 homogenous RF neg BMP wnl   Review of Systems  Constitutional:  Negative for fatigue.  HENT:  Positive for mouth dryness. Negative for mouth sores.   Eyes:  Positive for dryness.  Respiratory:  Negative for shortness of breath.   Cardiovascular:  Negative for chest pain and palpitations.  Gastrointestinal:  Negative for blood in stool, constipation and diarrhea.  Endocrine: Negative for increased urination.  Genitourinary:  Negative for involuntary urination.  Musculoskeletal:  Positive for joint pain, joint pain, joint swelling, myalgias and myalgias. Negative for gait problem, muscle weakness, morning stiffness and muscle tenderness.  Skin:  Positive for hair loss. Negative for color change, rash and sensitivity to sunlight.  Allergic/Immunologic: Negative for susceptible to infections.  Neurological:  Negative for dizziness and headaches.  Hematological:  Negative for swollen glands.  Psychiatric/Behavioral:  Negative for depressed mood and sleep disturbance. The patient is not nervous/anxious.     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   Diabetes type 2, controlled ([place])    Chronic venous insufficiency 09/16/2016   Varicose veins of both lower extremities with complications 0000000   Uterine leiomyoma 05/25/2016   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   Rectal bleeding 10/20/2015   Obesity 04/27/2015   Abnormal EKG 03/19/2015   Preventative health care 02/17/2015   OCD (obsessive compulsive disorder) 01/14/2015   Essential hypertension 01/13/2015   Peripheral edema 06/17/2012   NASAL POLYP 01/01/2008   Seasonal and perennial allergic rhinitis 01/01/2008   Moderate persistent asthma 01/01/2008   DEEP VENOUS THROMBOPHLEBITIS, HX OF 01/01/2008   History of DVT (deep vein thrombosis) 2005    Past Medical History:  Diagnosis Date   Allergic rhinitis, cause unspecified    Allergy    Asthma    Clotting disorder ([place])    DVT 2005   Diabetes type 2, controlled ([place])    pt denies- no meds- Pre DM-    History of DVT (deep vein thrombosis) 2005   Pt reports blood clot below knee ?unsure which leg.   Hyperlipidemia    Nasal polyps    Osteopenia    Unspecified essential hypertension     Family History  Problem Relation Age of Onset   Cancer Mother 76       ?carcinoid tumor (had surgical removal)   Diabetes Mother        borderline   Stomach cancer Mother    Liver cancer Mother    Heart disease Father        died 64, from MI, had hx of ETOH abuse and liver disease   Thyroid cancer Sister        (diagnosed at 20) half sister (same dad)     Diabetes type II Brother    Kidney disease Neg Hx    Hypertension Neg Hx    Hyperlipidemia Neg Hx    Colon cancer Neg Hx    Esophageal cancer Neg Hx    Rectal cancer Neg Hx    Colon polyps Neg Hx    Past Surgical History:  Procedure Laterality Date   COLONOSCOPY     LASIK Bilateral 2008   NASAL SINUS SURGERY  [id number]   Dr Erik Obey   OVARIAN CYST REMOVAL  02/2016   pt reported-- Dr Philis Pique   POLYPECTOMY     UTERINE FIBROID SURGERY  2005   Social History   Social History Narrative   Married   No children   No pets   Works at [employer] as a Freight forwarder    Enjoys movies, shopping, spending time with friends/family   Completed HS,  6 mos of college.     Immunization History  Administered Date(s) Administered   Hepatitis A, Adult 09/11/2019   Hepatitis B 07/17/2015, 08/21/2015, 03/04/2016   Influenza Split 08/20/2011, 09/18/2012, 08/29/2014, 08/30/2015   Influenza Whole 08/20/2010   Influenza, Quadrivalent, Recombinant, Inj, Pf 08/30/2019   Influenza,inj,Quad PF,6+ Mos 09/26/2013, 09/14/2016, 10/10/2017, 09/07/2018, 09/02/2021   Influenza-Unspecified 09/19/2016, 09/02/2020, 09/16/2022   Moderna Covid-19 Vaccine Bivalent Booster 72yr & up 10/12/2021   Moderna Sars-Covid-2 Vaccination 02/26/2020, 03/29/2020, 10/31/2020   Pneumococcal Polysaccharide-23 09/19/2009, 08/20/2011, 07/17/2015   Tdap 02/17/2015   Unspecified SARS-COV-2 Vaccination 09/16/2022   Zoster Recombinat (Shingrix) 09/11/2019, 08/24/2020     Objective: Vital Signs: BP 130/86 (BP Location: Left Arm, Patient Position: Sitting, Cuff Size: Large)   Pulse 80   Resp 16   Ht 5' 7"$  (1.702 m)   Wt 201 lb (91.2 kg)   LMP 06/22/2018   BMI 31.48 kg/m    Physical Exam Cardiovascular:     Rate and Rhythm: Normal rate and regular rhythm.  Pulmonary:     Effort: Pulmonary effort is normal.     Breath sounds: Normal breath sounds.  Musculoskeletal:     Right lower leg: No edema.     Left lower leg: No edema.   Lymphadenopathy:     Cervical: No cervical adenopathy.  Skin:    General: Skin is warm and dry.     Findings: No rash.  Neurological:     Mental Status: She is alert.  Psychiatric:        Mood and Affect: Mood normal.      Musculoskeletal Exam:  Shoulders full ROM no tenderness or swelling Elbows full ROM no tenderness or swelling Wrists full ROM no tenderness or swelling Fingers full ROM no tenderness or swelling Knees full ROM no tenderness or swelling mild left patellofemoral crepitus Ankles full ROM no tenderness or swelling   Investigation: No additional findings.  Imaging: XR KNEE 3 VIEW LEFT  Result Date: 01/25/2023 X-ray left knee 3 views AP view appears slightly medially rotated. There is some medial compartment joint space narrowing and marginal osteophyte process.  Patellofemoral compartment spaces grossly normal early lateral osteophyte as well.  No enthesophyte abnormal calcifications or joint effusion seen. Impression Medial compartment osteoarthritis and probable very slight lateral and patellofemoral changes  XR KNEE 3 VIEW RIGHT  Result Date: 01/25/2023 X-ray right knee 3 views Medial and lateral compartment joint spaces possible slight narrowing.  There are early marginal osteophyte processes on both [name] more advanced on lateral compartment.  Possible patellofemoral compartment narrowing on medial side and marginal osteophyte.  No visible joint effusion no abnormal calcifications seen. Impression Mild appearing tricompartmental osteoarthritis   Recent Labs: Lab Results  Component Value Date   WBC 6.2 04/07/2020   HGB 13.4 04/07/2020   PLT 270.0 04/07/2020   NA 142 12/31/2022   K 3.9 12/31/2022   CL 106 12/31/2022   CO2 26 12/31/2022   GLUCOSE 92 12/31/2022   BUN 12 12/31/2022   CREATININE 0.77 12/31/2022   BILITOT 0.5 09/20/2022   ALKPHOS 98 09/20/2022   AST 19 09/20/2022   ALT 17 09/20/2022   PROT 6.5 09/20/2022   ALBUMIN 3.8 09/20/2022    CALCIUM 10.0 12/31/2022    Speciality Comments: No specialty comments available.  Procedures:  No procedures performed Allergies: Aspirin, Latex, Penicillins, Shellfish allergy, and Shellfish-derived products   Assessment / Plan:     Visit Diagnoses: Osteoarthritis, unspecified osteoarthritis type, unspecified site - Plan: XR KNEE  3 VIEW RIGHT, XR KNEE 3 VIEW LEFT  Mild crepitus consistent with definitely some degree osteoarthritis will check x-rays of bilateral knees today.  Shows some tricompartmental disease worse to medial compartment and worse on the left knee that is consistent with her symptom complaints.  Not able to tolerate NSAID medication due to asthma exacerbation. Can try some further medications for this if not improving can also consider PT evaluation.  Positive ANA (antinuclear antibody) - Plan: Sedimentation rate, C-reactive protein, ANA  Still no specific clinical criteria or extra-articular findings again on exam today.  Will recheck ANA titer and pattern as well as serum inflammatory markers now she is off maintenance prednisone.  Still with lower suspicion of active inflammatory process causing joint pain with no objective findings on exam to consider trial of the more medication if acute phase reactants are high.  Orders: Orders Placed This Encounter  Procedures   XR KNEE 3 VIEW RIGHT   XR KNEE 3 VIEW LEFT   Sedimentation rate   C-reactive protein   ANA   Anti-nuclear ab-titer (ANA titer)   No orders of the defined types were placed in this encounter.    Follow-Up Instructions: No follow-ups on file.   Collier Salina, MD  Note - This record has been created using [employer].  Chart creation errors have been sought, but may not always  have been located. Such creation errors do not reflect on  the standard of medical care.

## 2023-01-25 NOTE — Patient Instructions (Signed)
For osteoarthritis several treatments may be beneficial:  - Topical antiinflammatory medicine such as diclofenac or Voltaren can be applied to  affected area as needed. Topical analgesics containing CBD, menthol, or lidocaine can be tried.  - Turmeric has some antiinflammatory effect similar to NSAIDs and may help, if taken as a supplement should not be taken above recommended doses.   - Compressive gloves or sleeve can be helpful to support the joint especially if hurting or swelling with certain activities.  - Physical therapy referral can discuss exercises or activity modification to improve symptoms or strength if needed.  - Local steroid injection is an option if symptoms become worse and not controlled by the above options.

## 2023-01-26 ENCOUNTER — Telehealth: Payer: Self-pay

## 2023-01-26 ENCOUNTER — Telehealth: Payer: Self-pay | Admitting: Internal Medicine

## 2023-01-26 NOTE — Telephone Encounter (Signed)
Returned call to patient and advised that refill of Dupixent was sent on 01/03/2023 and that she can call to schedule shipment when ready to do so. Advised that copay card funds reload every calendar year as well. She verbalized understanding. Nothing further needed.  Knox Saliva, PharmD, MPH, BCPS, CPP Clinical Pharmacist (Rheumatology and Pulmonology)

## 2023-01-26 NOTE — Telephone Encounter (Signed)
Patient called in regards to lab results from 01/25/2023. Please advise.   Patient was advised that one test is still pending.

## 2023-01-27 LAB — ANTI-NUCLEAR AB-TITER (ANA TITER)
ANA TITER: 1:80 {titer} — ABNORMAL HIGH
ANA Titer 1: 1:320 {titer} — ABNORMAL HIGH

## 2023-01-27 LAB — SEDIMENTATION RATE: Sed Rate: 2 mm/h (ref 0–30)

## 2023-01-27 LAB — C-REACTIVE PROTEIN: CRP: 1.6 mg/L (ref <8.0)

## 2023-01-27 LAB — ANA: Anti Nuclear Antibody (ANA): POSITIVE — AB

## 2023-01-28 ENCOUNTER — Encounter: Payer: Self-pay | Admitting: Internal Medicine

## 2023-02-03 ENCOUNTER — Telehealth: Payer: Self-pay | Admitting: Internal Medicine

## 2023-02-03 DIAGNOSIS — M1990 Unspecified osteoarthritis, unspecified site: Secondary | ICD-10-CM

## 2023-02-03 NOTE — Telephone Encounter (Signed)
Patient called the office stating she has left multiple messages and sent MyChart messages regarding her lab results from her Feb 6th appointment. Patient requests a call back today.

## 2023-02-04 ENCOUNTER — Encounter: Payer: Self-pay | Admitting: Internal Medicine

## 2023-02-04 MED ORDER — DULOXETINE HCL 30 MG PO CPEP: 30.0000 mg | ORAL_CAPSULE | Freq: Every day | ORAL | 2 refills | Status: DC

## 2023-02-04 NOTE — Telephone Encounter (Signed)
Called and left VM about returning call to discuss labs. Her ANA test result was positive at a slightly higher level compared to when we checked this before. The sedimentation rate and CRP were normal though. This makes current inflammation from an autoimmune problem less likely. Xrays of her knees shows mild osteoarthritis, most noticeably along the medial side of the left knee. Based on these findings I would not plan to start a new long term drug for autoimmune disease especially since she is already on the dupixent which is helping. If adrenal insufficiency, the low cortisol due to long term steroid use, remains a concern the best screening test for this is to check the cortisol level and ACTH fasting and as close to 8am as possible.

## 2023-02-04 NOTE — Addendum Note (Signed)
Addended by: Collier Salina on: 02/04/2023 02:02 PM   Modules accepted: Orders

## 2023-02-04 NOTE — Telephone Encounter (Signed)
Discussed ongoing knee pain recommend trial of duloxetine for knee osteoarthritis pain. Will send additional information on patient mychart to review.

## 2023-02-04 NOTE — Telephone Encounter (Signed)
LMOM for patient to call office to discuss lab results.

## 2023-02-07 ENCOUNTER — Telehealth: Payer: Self-pay | Admitting: Internal Medicine

## 2023-02-08 MED ORDER — AZITHROMYCIN 250 MG PO TABS: ORAL_TABLET | ORAL | 0 refills | Status: DC

## 2023-02-08 NOTE — Telephone Encounter (Signed)
Spoke with pt who states she currently has a stuffy nose that produces yellow mucus when blown, dry cough and sore throat. Pt states she did home covid test which was negative. Pt denies fever/ chills/ GI upset. [facility]erred [facility] [address] in [place]. Dr. Annamaria Boots please advise.

## 2023-02-08 NOTE — Telephone Encounter (Signed)
Please send Zpak 250 mg, # 6, 2 today then one daily  This may be a cold or early sinus infection, but also note Spring pollen season is just about to start. She can take otc symptom meds as needed.

## 2023-02-08 NOTE — Addendum Note (Signed)
Addended by: Gavin Potters R on: 02/08/2023 10:50 AM   Modules accepted: Orders

## 2023-02-08 NOTE — Telephone Encounter (Signed)
Notified pt that Dr. Annamaria Boots wants Z-Pak sent in. Pt stated understanding and verified pharmacy. Z-Pak order placed. Nothing further needed at this time.

## 2023-02-21 ENCOUNTER — Encounter: Payer: Self-pay | Admitting: Family Medicine

## 2023-02-21 ENCOUNTER — Ambulatory Visit (INDEPENDENT_AMBULATORY_CARE_PROVIDER_SITE_OTHER): Payer: BC Managed Care – PPO | Admitting: Family Medicine

## 2023-02-21 VITALS — BP 142/93

## 2023-02-21 DIAGNOSIS — R42 Dizziness and giddiness: Secondary | ICD-10-CM

## 2023-02-21 DIAGNOSIS — I10 Essential (primary) hypertension: Secondary | ICD-10-CM | POA: Diagnosis not present

## 2023-02-21 LAB — CBC WITH DIFFERENTIAL/PLATELET
Basophils Absolute: 0.1 10*3/uL (ref 0.0–0.1)
Basophils Relative: 0.7 % (ref 0.0–3.0)
Eosinophils Absolute: 0.3 10*3/uL (ref 0.0–0.7)
Eosinophils Relative: 3.5 % (ref 0.0–5.0)
HCT: 38.7 % (ref 36.0–46.0)
Hemoglobin: 12.7 g/dL (ref 12.0–15.0)
Lymphocytes Relative: 35 % (ref 12.0–46.0)
Lymphs Abs: 2.5 10*3/uL (ref 0.7–4.0)
MCHC: 32.8 g/dL (ref 30.0–36.0)
MCV: 78.2 fl (ref 78.0–100.0)
Monocytes Absolute: 0.7 10*3/uL (ref 0.1–1.0)
Monocytes Relative: 9.4 % (ref 3.0–12.0)
Neutro Abs: 3.7 10*3/uL (ref 1.4–7.7)
Neutrophils Relative %: 51.4 % (ref 43.0–77.0)
Platelets: 367 10*3/uL (ref 150.0–400.0)
RBC: 4.95 Mil/uL (ref 3.87–5.11)
RDW: 13.7 % (ref 11.5–15.5)
WBC: 7.2 10*3/uL (ref 4.0–10.5)

## 2023-02-21 LAB — BASIC METABOLIC PANEL
BUN: 10 mg/dL (ref 6–23)
CO2: 29 mEq/L (ref 19–32)
Calcium: 9.8 mg/dL (ref 8.4–10.5)
Chloride: 103 mEq/L (ref 96–112)
Creatinine, Ser: 0.7 mg/dL (ref 0.40–1.20)
GFR: 97.46 mL/min (ref >60.00)
Glucose, Bld: 84 mg/dL (ref 70–99)
Potassium: 3.4 mEq/L — ABNORMAL LOW (ref 3.5–5.1)
Sodium: 142 mEq/L (ref 135–145)

## 2023-02-21 NOTE — Patient Instructions (Signed)
Blood pressure is not at goal for age and co-morbidities.   Recommendations: increase lisinopril to 5 mg daily (2 of your 2.5 mg tablets) - BP goal <130/80 - monitor and log blood pressures at home - check around the same time each day in a relaxed setting - Limit salt to <2000 mg/day - Follow DASH eating plan (heart healthy diet) - limit alcohol to 2 standard drinks per day for men and 1 per day for women - avoid tobacco products - get at least 2 hours of regular aerobic exercise weekly Patient aware of signs/symptoms requiring further/urgent evaluation. Labs updated today. See PCP in 2 weeks  Please contact office for follow-up if symptoms do not improve or worsen. Seek emergency care if symptoms become severe.

## 2023-02-21 NOTE — Progress Notes (Signed)
Acute Office Visit  Subjective:     Patient ID: [name], female    DOB: [birth year], 57 y.o.   MRN: [id number]  CC: dizziness, high blood pressure   HPI Patient is in today for dizziness.     Dizziness  She reports new onset dizziness. She describes it as feeling light headed, occurs intermittently, and typically lasts a few seconds.   It is usually relieved by sitting still. She has started new medications around the time the dizziness started. Duloxetine. Reports she stopped taking it a few days ago, but symptoms did not improve after starting. States she has been checking her blood pressure at home and it has been high the past few days (177/113, 165/113, 156/103, 171/100). She denies any spinning sensation. States she is eating and hydrating like normal. Denies any new stressors.   Associated symptoms: No hearing loss No tinnitus  No chest discomfort No heart palpitations  No heart racing No numbness or tingling of extremities, maybe one episode of some finger tingling for a few seconds   No nausea No vomiting  No speech difficulty No visual changes    Wt Readings from Last 3 Encounters:  01/25/23 201 lb (91.2 kg)  11/18/22 204 lb 6.4 oz (92.7 kg)  09/20/22 199 lb 6.4 oz (90.4 kg)    BP Readings from Last 3 Encounters:  02/21/23 (!) 142/93  01/25/23 130/86  11/18/22 114/78      Lab Results  Component Value Date   WBC 7.2 02/21/2023   HGB 12.7 02/21/2023   HCT 38.7 02/21/2023   MCV 78.2 02/21/2023   PLT 367.0 02/21/2023   Lab Results  Component Value Date   NA 142 02/21/2023   K 3.4 (L) 02/21/2023   CO2 29 02/21/2023   BUN 10 02/21/2023   CREATININE 0.70 02/21/2023   CALCIUM 9.8 02/21/2023   GLUCOSE 84 02/21/2023     ---------------------------------------------------------------------------------------------------       ROS All review of systems negative except what is listed in the HPI      Objective:    BP (!) 142/93   LMP  06/22/2018   BP: 160/100, 142/93    Physical Exam Vitals reviewed.  Constitutional:      Appearance: Normal appearance.  HENT:     Head: Normocephalic and atraumatic.     Comments: Negative Dix-Hallpike  Cardiovascular:     Rate and Rhythm: Normal rate and regular rhythm.     Pulses: Normal pulses.     Heart sounds: Normal heart sounds.  Pulmonary:     Effort: Pulmonary effort is normal.     Breath sounds: Normal breath sounds.  Musculoskeletal:     Cervical back: Normal range of motion and neck supple.  Skin:    General: Skin is warm and dry.  Neurological:     General: No focal deficit present.     Mental Status: She is alert and oriented to person, place, and time. Mental status is at baseline.     Motor: No weakness.     Gait: Gait normal.  Psychiatric:        Mood and Affect: Mood normal.        Behavior: Behavior normal.        Thought Content: Thought content normal.        Judgment: Judgment normal.        Results for orders placed or performed in visit on [id number]  Basic metabolic panel  Result Value Ref Range  Sodium 142 135 - 145 mEq/L   Potassium 3.4 (L) 3.5 - 5.1 mEq/L   Chloride 103 96 - 112 mEq/L   CO2 29 19 - 32 mEq/L   Glucose, Bld 84 70 - 99 mg/dL   BUN 10 6 - 23 mg/dL   Creatinine, Ser 0.70 0.40 - 1.20 mg/dL   GFR 97.46 >60.00 mL/min   Calcium 9.8 8.4 - 10.5 mg/dL  CBC with Differential/Platelet  Result Value Ref Range   WBC 7.2 4.0 - 10.5 K/uL   RBC 4.95 3.87 - 5.11 Mil/uL   Hemoglobin 12.7 12.0 - 15.0 g/dL   HCT 38.7 36.0 - 46.0 %   MCV 78.2 78.0 - 100.0 fl   MCHC 32.8 30.0 - 36.0 g/dL   RDW 13.7 11.5 - 15.5 %   Platelets 367.0 150.0 - 400.0 K/uL   Neutrophils Relative % 51.4 43.0 - 77.0 %   Lymphocytes Relative 35.0 12.0 - 46.0 %   Monocytes Relative 9.4 3.0 - 12.0 %   Eosinophils Relative 3.5 0.0 - 5.0 %   Basophils Relative 0.7 0.0 - 3.0 %   Neutro Abs 3.7 1.4 - 7.7 K/uL   Lymphs Abs 2.5 0.7 - 4.0 K/uL   Monocytes Absolute  0.7 0.1 - 1.0 K/uL   Eosinophils Absolute 0.3 0.0 - 0.7 K/uL   Basophils Absolute 0.1 0.0 - 0.1 K/uL        Assessment & Plan:   1. Essential hypertension 2. Dizziness Negative Dix-Hallpike and no other alarming symptoms today. Dizziness likely related to HTN. Educated on signs and symptoms to monitor for - strict ED precautions.  Blood pressure is not at goal for age and co-morbidities.   Recommendations: increase lisinopril to 5 mg daily (2 of your 2.5 mg tablets) - BP goal <130/80 - monitor and log blood pressures at home - check around the same time each day in a relaxed setting - Limit salt to <2000 mg/day - Follow DASH eating plan (heart healthy diet) - limit alcohol to 2 standard drinks per day for men and 1 per day for women - avoid tobacco products - get at least 2 hours of regular aerobic exercise weekly Patient aware of signs/symptoms requiring further/urgent evaluation. Labs updated today. See PCP in 2 weeks  - Basic metabolic panel - CBC with Differential/Platelet    No orders of the defined types were placed in this encounter.   Return in about 2 weeks (around 03/07/2023) for PCP BP and dizziness f/u .  Terrilyn Saver, NP

## 2023-02-23 ENCOUNTER — Encounter: Payer: Self-pay | Admitting: Family

## 2023-02-23 ENCOUNTER — Other Ambulatory Visit: Payer: Self-pay | Admitting: Family

## 2023-02-23 MED ORDER — LISINOPRIL 10 MG PO TABS: 10.0000 mg | ORAL_TABLET | Freq: Every day | ORAL | 3 refills | Status: DC

## 2023-03-04 ENCOUNTER — Ambulatory Visit: Payer: BC Managed Care – PPO | Admitting: Family

## 2023-03-04 ENCOUNTER — Encounter: Payer: Self-pay | Admitting: Family

## 2023-03-04 VITALS — BP 130/79 | HR 76 | Temp 97.7°F | Resp 16 | Wt 196.0 lb

## 2023-03-04 DIAGNOSIS — M2550 Pain in unspecified joint: Secondary | ICD-10-CM | POA: Diagnosis not present

## 2023-03-04 DIAGNOSIS — I10 Essential (primary) hypertension: Secondary | ICD-10-CM | POA: Diagnosis not present

## 2023-03-04 LAB — BASIC METABOLIC PANEL
BUN: 14 mg/dL (ref 6–23)
CO2: 30 mEq/L (ref 19–32)
Calcium: 10.1 mg/dL (ref 8.4–10.5)
Chloride: 107 mEq/L (ref 96–112)
Creatinine, Ser: 0.7 mg/dL (ref 0.40–1.20)
GFR: 97.44 mL/min (ref >60.00)
Glucose, Bld: 85 mg/dL (ref 70–99)
Potassium: 4 mEq/L (ref 3.5–5.1)
Sodium: 142 mEq/L (ref 135–145)

## 2023-03-04 NOTE — Assessment & Plan Note (Signed)
BP Readings from Last 3 Encounters:  03/04/23 130/79  02/21/23 (!) 142/93  01/25/23 130/86   BP stable/improved. Will repeat bmet today.

## 2023-03-04 NOTE — Assessment & Plan Note (Signed)
States that her rheumatologist started her on cymbalta for pain. She notes that she had recently started taking when she experienced dizziness.  However, her BP was also high at that time.  She discontinued as a result.  Advised OK to retry cymbalta and can discontinue if recurrent dizziness.

## 2023-03-04 NOTE — Progress Notes (Signed)
Subjective:   By signing my name below, I, [name], attest that this documentation has been prepared under the direction and in the presence of Veronica Alar, NP. 03/04/2023.   Patient ID: [name], female    DOB: Aug 04, 1966, 57 y.o.   MRN: [id number]  Chief Complaint  Patient presents with   Hypertension    Here for follow up after medication increased.    HPI Patient is in today for an office visit.  Hypertension:  Her blood pressure is 130/79 in clinic today. We increased her lisinopril from 5mg  to 10mg  due to some high home blood pressure readings with associated dizziness.  BP Readings from Last 3 Encounters:  03/04/23 130/79  02/21/23 (!) 142/93  01/25/23 130/86   Reports max home bp reading was 166/103.      Past Medical History:  Diagnosis Date   Allergic rhinitis, cause unspecified    Allergy    Asthma    Clotting disorder ([place])    DVT 2005   Diabetes type 2, controlled ([place])    pt denies- no meds- Pre DM-    History of DVT (deep vein thrombosis) 2005   Pt reports blood clot below knee ?unsure which leg.   Hyperlipidemia    Nasal polyps    Osteopenia    Unspecified essential hypertension     Past Surgical History:  Procedure Laterality Date   COLONOSCOPY     LASIK Bilateral 2008   NASAL SINUS SURGERY  [id number]   Dr Erik Obey   OVARIAN CYST REMOVAL  02/2016   pt reported-- Dr Philis Pique   POLYPECTOMY     UTERINE FIBROID SURGERY  2005    Family History  Problem Relation Age of Onset   Cancer Mother 29       ?carcinoid tumor (had surgical removal)   Diabetes Mother        borderline   Stomach cancer Mother    Liver cancer Mother    Heart disease Father        died 40, from MI, had hx of ETOH abuse and liver disease   Thyroid cancer Sister        (diagnosed at 46) half sister (same dad)    Diabetes type II Brother    Kidney disease Neg Hx    Hypertension Neg Hx    Hyperlipidemia Neg Hx    Colon cancer Neg Hx    Esophageal  cancer Neg Hx    Rectal cancer Neg Hx    Colon polyps Neg Hx     Social History   Socioeconomic History   Marital status: Married    Spouse name: Not on file   Number of children: Not on file   Years of education: Not on file   Highest education level: Not on file  Occupational History   Occupation: Freight forwarder [employer]  Tobacco Use   Smoking status: Never    Passive exposure: Past   Smokeless tobacco: Never  Vaping Use   Vaping Use: Never used  Substance and Sexual Activity   Alcohol use: Yes    Alcohol/week: 0.0 standard drinks of alcohol    Comment: 1 glass of wine per year   Drug use: No   Sexual activity: Yes    Birth control/protection: None  Other Topics Concern   Not on file  Social History Narrative   Married   No children   No pets   Works at [employer] as a Freight forwarder    Enjoys  movies, shopping, 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     Social Determinants of Health   Financial Resource Strain: Not on file  Food Insecurity: Not on file  Transportation Needs: Not on file  Physical Activity: Not on file  Stress: Not on file  Social Connections: Not on file  Intimate Partner Violence: Not on file    Outpatient Medications Prior to Visit  Medication Sig Dispense Refill   albuterol (PROVENTIL) (2.5 MG/3ML) 0.083% nebulizer solution Take 2.5 mg by nebulization every 4 (four) hours as needed for wheezing or shortness of breath.     albuterol (VENTOLIN HFA) 108 (90 Base) MCG/ACT inhaler INHALE 2 PUFFS BY MOUTH EVERY 6 HOURS AS NEEDED FOR WHEEZING FOR SHORTNESS OF BREATH 27 g 0   Calcium Carbonate-Vit D-Min (CALTRATE 600+D PLUS MINERALS) 600-800 MG-UNIT CHEW Chew 1 tablet by mouth in the morning and at bedtime. 60 tablet    cetirizine (ZYRTEC) 10 MG tablet Take 10 mg by mouth daily.     DULoxetine (CYMBALTA) 30 MG capsule Take 1 capsule (30 mg total) by mouth daily. 30 capsule 2   Dupilumab (DUPIXENT) 300 MG/2ML SOPN INJECT 300MG  INTO THE  SKIN EVERY 14 DAYS 4 mL 5   fluticasone (FLONASE) 50 MCG/ACT nasal spray 2 sprays each nostril daily 16 g 12   Fluticasone-Umeclidin-Vilant (TRELEGY ELLIPTA) 100-62.5-25 MCG/ACT AEPB Inhale 1 puff into the lungs daily. 60 each 11   hydroxychloroquine (PLAQUENIL) 200 MG tablet Take 200 mg by mouth 2 (two) times daily.     lisinopril (ZESTRIL) 10 MG tablet Take 1 tablet (10 mg total) by mouth daily. 30 tablet 3   Prenatal Vit-Fe Sulfate-FA (PRENATAL VITAMIN PO) Take 1 capsule by mouth daily.     predniSONE (DELTASONE) 10 MG tablet Use sparingly as directed 200 tablet 0   No facility-administered medications prior to visit.    Allergies  Allergen Reactions   Aspirin     wheezing   Latex Hives   Penicillins Hives   Shellfish Allergy    Shellfish-Derived Products Other (See Comments)    ROS See HPI    Objective:    Physical Exam Constitutional:      Appearance: Normal appearance.  HENT:     Head: Normocephalic and atraumatic.     Right Ear: Tympanic membrane, ear canal and external ear normal.     Left Ear: Tympanic membrane, ear canal and external ear normal.  Eyes:     Extraocular Movements: Extraocular movements intact.     Pupils: Pupils are equal, round, and reactive to light.  Cardiovascular:     Rate and Rhythm: Normal rate and regular rhythm.     Heart sounds: Normal heart sounds. No murmur heard.    No gallop.  Pulmonary:     Effort: Pulmonary effort is normal. No respiratory distress.     Breath sounds: Normal breath sounds. No wheezing or rales.  Skin:    General: Skin is warm and dry.  Neurological:     General: No focal deficit present.     Mental Status: She is alert and oriented to person, place, and time.  Psychiatric:        Mood and Affect: Mood normal.        Behavior: Behavior normal.     BP 130/79 (BP Location: Right Arm, Patient Position: Sitting, Cuff Size: Small)   Pulse 76   Temp 97.7 F (36.5 C) (Oral)   Resp 16   Wt  196 lb (88.9 kg)    LMP 06/22/2018   SpO2 100%   BMI 30.70 kg/m  Wt Readings from Last 3 Encounters:  03/04/23 196 lb (88.9 kg)  01/25/23 201 lb (91.2 kg)  11/18/22 204 lb 6.4 oz (92.7 kg)      Assessment & Plan:   Problem List Items Addressed This Visit       Unprioritized   Polyarthralgia    States that her rheumatologist started her on cymbalta for pain. She notes that she had recently started taking when she experienced dizziness.  However, her BP was also high at that time.  She discontinued as a result.  Advised OK to retry cymbalta and can discontinue if recurrent dizziness.      Essential hypertension - Primary    BP Readings from Last 3 Encounters:  03/04/23 130/79  02/21/23 (!) 142/93  01/25/23 130/86  BP stable/improved. Will repeat bmet today.      Relevant Orders   Basic Metabolic Panel (BMET)     No orders of the defined types were placed in this encounter.   I, Nance Pear, NP, personally preformed the services described in this documentation.  All medical record entries made by the scribe were at my direction and in my presence.  I have reviewed the chart and discharge instructions (if applicable) and agree that the record reflects my personal performance and is accurate and complete. 03/04/2023.  I,Mathew Stumpf,acting as a Education administrator for [employer], NP.,have documented all relevant documentation on the behalf of Nance Pear, NP,as directed by  Nance Pear, NP while in the presence of Nance Pear, NP.   Nance Pear, NP

## 2023-03-21 ENCOUNTER — Telehealth: Payer: Self-pay

## 2023-03-21 DIAGNOSIS — M1990 Unspecified osteoarthritis, unspecified site: Secondary | ICD-10-CM

## 2023-03-21 NOTE — Telephone Encounter (Signed)
Patient contacted the office stating she had sent a message at the beginning of March and it has went unanswered. The patient states she is having joint pain all over and the Duloxetine is not helping. Patient states she did take Prednisone in the past and it did help. Patient states she would like to try something different. Patient's call back number is [phone]. Please Advise.

## 2023-03-22 MED ORDER — PREGABALIN 75 MG PO CAPS: 75.0000 mg | ORAL_CAPSULE | Freq: Two times a day (BID) | ORAL | 1 refills | Status: DC

## 2023-03-22 NOTE — Telephone Encounter (Signed)
I spoke with [name] so far she has not seen any appreciable benefit from the duloxetine after starting the medicine over 1 month ago.  She did interrupt this temporarily because she is worried it may be contributing to dizziness symptoms.  She saw her PCP [name] was also found to have somewhat uncontrolled hypertension and treatment adjustment for this.  Dizziness is subsequently doing better and she restarted the medicine but still with no appreciable symptom change. I discussed options including going back onto low-dose prednisone we like to avoid this since she previously went through successful steroid tapering after long-term use.  Will try switching to Lyrica 75 mg twice daily starting dose as alternative option.  Recommended we should check back in if not seeing some difference in symptoms after 1 month or if she has some problem taking the medicine.

## 2023-04-04 DIAGNOSIS — N950 Postmenopausal bleeding: Secondary | ICD-10-CM | POA: Diagnosis not present

## 2023-04-04 DIAGNOSIS — Z6831 Body mass index (BMI) 31.0-31.9, adult: Secondary | ICD-10-CM | POA: Diagnosis not present

## 2023-04-04 DIAGNOSIS — R1032 Left lower quadrant pain: Secondary | ICD-10-CM | POA: Diagnosis not present

## 2023-04-15 ENCOUNTER — Telehealth: Payer: Self-pay | Admitting: Internal Medicine

## 2023-04-15 NOTE — Telephone Encounter (Unsigned)
Patient called requesting to speak with Dr. Gregary Cromer nurse regarding "medication."   Patient requested a return call.

## 2023-04-15 NOTE — Telephone Encounter (Signed)
Mailbox full

## 2023-04-15 NOTE — Telephone Encounter (Signed)
Patient contacted the office and states her Lyrica is not working and she still has a lot of pain in her joints. Patient states she used to be on duloxetine and that did not work for her either. Patient states she would like to change to another medication that can help her manage her pain. Patient states prednisone works well for her but she would like to try to get off of the prednisone. Patient states she feels like she has a lot of fluid in her joints. Patient's call back number is [phone]. Patient states she would like a call back from Dr. Dimple Casey. Please advise.

## 2023-04-18 NOTE — Telephone Encounter (Signed)
Attempted to contact the patient and tried to leave a message but the mailbox was full.

## 2023-04-18 NOTE — Telephone Encounter (Signed)
My next step would probably referring to physical therapy if seeing no benefit on multiple medications. Otherwise I think we will need to schedule a follow up appointment about discussing alternative treatment options and taking another look if she has increase in joint swelling.

## 2023-04-18 NOTE — Telephone Encounter (Signed)
Patient advised Dr. Gregary Cromer next step would probably referring to physical therapy if seeing no benefit on multiple medications. Otherwise Dr. Dimple Casey thinks we will need to schedule a follow up appointment about discussing alternative treatment options and taking another look if she has increase in joint swelling. Patient states she would like to schedule a follow up appointment. Transferred patient to Admin to be scheduled for a follow up visit.

## 2023-04-21 DIAGNOSIS — D259 Leiomyoma of uterus, unspecified: Secondary | ICD-10-CM | POA: Diagnosis not present

## 2023-05-03 ENCOUNTER — Telehealth: Payer: Self-pay | Admitting: Family

## 2023-05-03 ENCOUNTER — Ambulatory Visit: Payer: BC Managed Care – PPO | Admitting: Family

## 2023-05-03 ENCOUNTER — Encounter: Payer: Self-pay | Admitting: Internal Medicine

## 2023-05-03 ENCOUNTER — Ambulatory Visit: Payer: BC Managed Care – PPO | Attending: Internal Medicine | Admitting: Internal Medicine

## 2023-05-03 VITALS — BP 127/72 | HR 104 | Temp 98.0°F | Resp 16 | Wt 200.0 lb

## 2023-05-03 VITALS — BP 128/83 | HR 83 | Resp 14 | Ht 67.0 in | Wt 199.0 lb

## 2023-05-03 DIAGNOSIS — M25462 Effusion, left knee: Secondary | ICD-10-CM | POA: Insufficient documentation

## 2023-05-03 DIAGNOSIS — R600 Localized edema: Secondary | ICD-10-CM | POA: Diagnosis not present

## 2023-05-03 DIAGNOSIS — J3089 Other allergic rhinitis: Secondary | ICD-10-CM

## 2023-05-03 DIAGNOSIS — R768 Other specified abnormal immunological findings in serum: Secondary | ICD-10-CM

## 2023-05-03 DIAGNOSIS — E010 Iodine-deficiency related diffuse (endemic) goiter: Secondary | ICD-10-CM | POA: Diagnosis not present

## 2023-05-03 DIAGNOSIS — I872 Venous insufficiency (chronic) (peripheral): Secondary | ICD-10-CM

## 2023-05-03 DIAGNOSIS — J302 Other seasonal allergic rhinitis: Secondary | ICD-10-CM | POA: Diagnosis not present

## 2023-05-03 DIAGNOSIS — E119 Type 2 diabetes mellitus without complications: Secondary | ICD-10-CM | POA: Diagnosis not present

## 2023-05-03 DIAGNOSIS — M1990 Unspecified osteoarthritis, unspecified site: Secondary | ICD-10-CM | POA: Diagnosis not present

## 2023-05-03 DIAGNOSIS — M2550 Pain in unspecified joint: Secondary | ICD-10-CM | POA: Diagnosis not present

## 2023-05-03 LAB — SYNOVIAL FLUID ANALYSIS, COMPLETE
Basophils, %: 0 %
Eosinophils-Synovial: 1 % (ref 0–2)
Lymphocytes-Synovial Fld: 17 % (ref 0–74)
Monocyte/Macrophage: 70 % — ABNORMAL HIGH (ref 0–69)
Neutrophil, Synovial: 7 % (ref 0–24)
Synoviocytes, %: 5 % (ref 0–15)
WBC, Synovial: 189 cells/uL — ABNORMAL HIGH (ref <150)

## 2023-05-03 NOTE — Telephone Encounter (Signed)
Also call Dr. Kittie Plater office to request copy of Pap?

## 2023-05-03 NOTE — Telephone Encounter (Signed)
Please call [facility] to request copy of DM eye exam.

## 2023-05-03 NOTE — Progress Notes (Signed)
Office Visit Note  Patient: [name]             Date of Birth: 03/12/1966           MRN: [id number]             PCP: Sandford Craze, NP Referring: Sandford Craze, NP Visit Date: 05/03/2023   Subjective:  Follow-up (Patient states she is still having joint pain mainly in her left leg. Patient states her left leg stays swollen more than her right leg. Patient states she has noticed more pain in her hands and arms recently. )   History of Present Illness: [name] is a 57 y.o. female here for follow up for ongoing joint pain in multiple sites with some associated swelling and a positive ANA.  She is still taking hydroxychloroquine but not seeing a lot of additional relief.  She tried switching to the Lyrica but found this less helpful and she was seeing on the Cymbalta and went back to daily Cymbalta.  She did receive additional intermittent prednisone treatment for asthma exacerbation and as usual symptoms were improved while on the steroids.  Worst problem is swelling in the left knee and distal leg with pain in this area.  Has body aches and multiple other joints and tiny amount of right knee swelling but much less severe.  Previous HPI 01/25/23 [name] is a 57 y.o. female here for follow up with ongoing joint and muscle pain and positive ANA.  Laboratory testing at our initial visit was negative for any specific antibody markers.  At the time was on steroids to had caveat for not seeing any peripheral joint synovitis.  Since that time she continues having joint pain and aches while off oral steroids.  Worst affected area is in the left leg and she occasionally sees swelling at the knee or distally.  No new skin rashes or other visible changes.   Previous HPI 11/26/21 [name] is a 57 y.o. female here for evaluation of body and muscle pains with positive ANA. She has  a history of adult onset moderate to severe persistent asthma on long-term  treatment also requiring chronic, intermittent steroid exposure. Currently at day 2 of another treatment course. Muscle pains are new problem during approximately the past 1 year.  She notices pain in the upper and lower portions of the legs and feels like it is in the muscle often provoked after prolonged walking and standing all day for work.  Symptoms typically resolve after resting and sleeping improved by the next day.  She does not take any specific medications for this.  She feels there is an improvement with taking a hot shower.  She has morning stiffness lasting a few minutes each day.  She has some chronic swelling in the left leg distally longstanding for years and unremarkable on previous vascular evaluation.  Otherwise does not notice localized swelling in the affected areas.  She denies any problems in her torso and upper extremities.  Symptoms are only occurring once she is off steroids for at least a month or 2 for her asthma and completely improved when she is taking steroids. Besides muscle pains she also notices dry eyes mouth without any specific injury or ulceration.  She has hot flashes and intermittent heat intolerance and is about 3 years into menopausal symptoms.  She has a history of unprovoked blood clot in the right leg in 2010 workup for antiphospholipid antibodies was negative. She  had 2 early trimester miscarriages and no completed pregnancies. She denies any other problems with clots, bleeding, or bruising. She has generalized alopecia without any rashes or itching. She denies lymphadenopathy or raynaud's symptoms.   Labs reviewed 09/2021 ANA 1:160 speckled 1:40 homogenous RF neg BMP wnl   Review of Systems  Constitutional:  Positive for fatigue.  HENT:  Positive for mouth dryness. Negative for mouth sores.   Eyes:  Negative for dryness.  Respiratory:  Negative for shortness of breath.   Cardiovascular:  Negative for chest pain and palpitations.  Gastrointestinal:   Positive for diarrhea. Negative for blood in stool and constipation.  Endocrine: Negative for increased urination.  Genitourinary:  Negative for involuntary urination.  Musculoskeletal:  Positive for joint pain, joint pain, joint swelling, myalgias, muscle weakness, morning stiffness, muscle tenderness and myalgias. Negative for gait problem.  Skin:  Positive for sensitivity to sunlight. Negative for color change, rash and hair loss.  Allergic/Immunologic: Negative for susceptible to infections.  Neurological:  Negative for dizziness and headaches.  Hematological:  Negative for swollen glands.  Psychiatric/Behavioral:  Positive for sleep disturbance. Negative for depressed mood. The patient is not nervous/anxious.     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   History of sinus surgery 07/31/2021   Nasal polyps    Hyperlipidemia    Clotting disorder (HCC)    Allergy    Severe persistent asthma 03/24/2021   Steroid dependence (HCC) 04/01/2020   Diabetes type 2, controlled (HCC)    Chronic venous insufficiency 09/16/2016   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   Uterine leiomyoma 05/25/2016   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   Rectal bleeding 10/20/2015   Obesity 04/27/2015   Abnormal EKG 03/19/2015   Preventative health care 02/17/2015   OCD (obsessive compulsive disorder) 01/14/2015   Essential hypertension 01/13/2015   Peripheral edema 06/17/2012   NASAL POLYP 01/01/2008   Seasonal and perennial allergic rhinitis 01/01/2008   Moderate persistent  asthma 01/01/2008   DEEP VENOUS THROMBOPHLEBITIS, HX OF 01/01/2008   History of DVT (deep vein thrombosis) 2005    Past Medical History:  Diagnosis Date   Allergic rhinitis, cause unspecified    Allergy    Asthma    Clotting disorder (HCC)    DVT 2005   Diabetes type 2, controlled (HCC)    pt denies- no meds- Pre DM-    History of DVT (deep vein thrombosis) 2005   Pt reports blood clot below knee ?unsure which leg.   Hyperlipidemia    Nasal polyps    Osteopenia    Unspecified essential hypertension     Family History  Problem Relation Age of Onset   Cancer Mother 14       ?carcinoid tumor (had surgical removal)   Diabetes Mother        borderline   Stomach cancer Mother    Liver cancer Mother    Heart disease Father        died 39, from MI, had hx of ETOH abuse and liver disease   Thyroid cancer Sister        (  diagnosed at 40) half sister (same dad)    Diabetes type II Brother    Kidney disease Neg Hx    Hypertension Neg Hx    Hyperlipidemia Neg Hx    Colon cancer Neg Hx    Esophageal cancer Neg Hx    Rectal cancer Neg Hx    Colon polyps Neg Hx    Past Surgical History:  Procedure Laterality Date   COLONOSCOPY     LASIK Bilateral 2008   NASAL SINUS SURGERY  2008   Dr Lazarus Salines   OVARIAN CYST REMOVAL  02/2016   pt reported-- Dr [name]   POLYPECTOMY     UTERINE FIBROID SURGERY  2005   Social History   Social History Narrative   Married   No children   No pets   Works at [employer] as a Scientist, water quality, shopping, 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     Immunization History  Administered Date(s) Administered   Hepatitis A, Adult 09/11/2019   Hepatitis B 07/17/2015, 08/21/2015, 03/04/2016   Influenza Split 08/20/2011, 09/18/2012, 08/29/2014, 08/30/2015   Influenza Whole 08/20/2010   Influenza, Quadrivalent, Recombinant, Inj, Pf 08/30/2019   Influenza,inj,Quad PF,6+ Mos 09/26/2013, 09/14/2016, 10/10/2017, 09/07/2018,  09/02/2021   Influenza-Unspecified 09/19/2016, 09/02/2020, 09/16/2022   Moderna Covid-19 Vaccine Bivalent Booster 44yrs & up 10/12/2021   Moderna Sars-Covid-2 Vaccination 02/26/2020, 03/29/2020, 10/31/2020   Pneumococcal Polysaccharide-23 09/19/2009, 08/20/2011, 07/17/2015   Tdap 02/17/2015   Unspecified SARS-COV-2 Vaccination 09/16/2022   Zoster Recombinat (Shingrix) 09/11/2019, 08/24/2020     Objective: Vital Signs: BP 128/83 (BP Location: Left Arm, Patient Position: Sitting, Cuff Size: Normal)   Pulse 83   Resp 14   Ht 5\' 7"  (1.702 m)   Wt 199 lb (90.3 kg)   LMP 06/22/2018   BMI 31.17 kg/m    Physical Exam Eyes:     Conjunctiva/sclera: Conjunctivae normal.  Cardiovascular:     Rate and Rhythm: Normal rate and regular rhythm.  Pulmonary:     Effort: Pulmonary effort is normal.     Breath sounds: Normal breath sounds.  Musculoskeletal:     Comments: Right leg trace pedal edema Left leg 1+ pitting to below the level of the knee  Lymphadenopathy:     Cervical: No cervical adenopathy.  Skin:    General: Skin is warm and dry.     Findings: No rash.  Neurological:     Mental Status: She is alert.  Psychiatric:        Mood and Affect: Mood normal.      Musculoskeletal Exam:  Shoulders full ROM no tenderness or swelling Elbows full ROM no tenderness or swelling Wrists full ROM no tenderness or swelling Fingers full ROM no tenderness or swelling Bilateral knee joint line tenderness to pressure and effusions present, larger in left knee than right Ankles full ROM no tenderness or swelling  Investigation: No additional findings.  Imaging: US THYROID  Result Date: 05/08/2023 CLINICAL DATA:  Left thyroid enlargement on physical examination EXAM: THYROID ULTRASOUND TECHNIQUE: Ultrasound examination of the thyroid gland and adjacent soft tissues was performed. COMPARISON:  None available. FINDINGS: Parenchymal Echotexture: Mildly heterogenous Isthmus: 0.6 cm Right lobe:  4.7 x 1.7 x 1.9 cm Left lobe: 4.1 x 1.4 x 1.4 cm _________________________________________________________ Estimated total number of nodules >/= 1 cm: 0 Number of spongiform nodules >/=  2 cm not described below (TR1): 0 Number of mixed cystic and solid nodules >/= 1.5 cm not  described below (TR2): 0 _________________________________________________________ Multiple tiny subcentimeter bilateral cysts do not meet criteria for FNA or imaging surveillance. IMPRESSION: No significant sonographic abnormality of the thyroid. The above is in keeping with the ACR TI-RADS recommendations - J Am Coll Radiol 2017;14:587-595. Electronically Signed   By: Acquanetta Belling M.D.   On: 05/08/2023 19:58    Recent Labs: Lab Results  Component Value Date   WBC 7.2 02/21/2023   HGB 12.7 02/21/2023   PLT 367.0 02/21/2023   NA 142 03/04/2023   K 4.0 03/04/2023   CL 107 03/04/2023   CO2 30 03/04/2023   GLUCOSE 85 03/04/2023   BUN 14 03/04/2023   CREATININE 0.70 03/04/2023   BILITOT 0.5 09/20/2022   ALKPHOS 98 09/20/2022   AST 19 09/20/2022   ALT 17 09/20/2022   PROT 6.5 09/20/2022   ALBUMIN 3.8 09/20/2022   CALCIUM 10.1 03/04/2023    Speciality Comments: No specialty comments available.  Procedures:  Large Joint Inj: L knee on 05/03/2023 3:00 PM Indications: pain, joint swelling and diagnostic evaluation Details: 22 G needle, superolateral approach Medications: 3 mL lidocaine 1 %; 40 mg triamcinolone acetonide 40 MG/ML Aspirate: 22 mL clear and yellow; sent for lab analysis Outcome: tolerated well, no immediate complications Procedure, treatment alternatives, risks and benefits explained, specific risks discussed. Consent was given by the patient. Immediately prior to procedure a time out was called to verify the correct patient, procedure, equipment, support staff and site/side marked as required. Patient was prepped and draped in the usual sterile fashion.     Allergies: Aspirin, Latex, Penicillins,  Shellfish allergy, and Shellfish-derived products   Assessment / Plan:     Visit Diagnoses: Positive ANA (antinuclear antibody) Polyarthralgia  Continued joint pain at multiple areas with nonspecific lab findings just positive ANA.  Do not appreciate any synovitis on exam outside of joint swelling in the bilateral knees.  I think continuing hydroxychloroquine 200 mg twice daily is reasonable although no great response to treatment so far.  If joint fluid analysis indicates active inflammation process with plan to just resume low-dose systemic steroids for now as next step.  Otherwise continuing workup and treatment for mostly osteoarthritis as primary issue.  Peripheral edema Chronic venous insufficiency  Only mild leg swelling present on exam today is a bit worse on the left side.  I do think the large knee effusion is contributing to this either with decreased mobility to help venous return or with additional pressure against venous system.  Osteoarthritis, unspecified osteoarthritis type, unspecified site  Does have known osteoarthritis of multiple sites including knees which are her most problematic area. Cymbalta a good option to continue since she cannot tolerate NSAIDs. Could benefit with more advanced imaging such as MRI or if not then starting some formal physical therapy for this.  Effusion, left knee - Plan: Synovial Fluid Analysis, Complete, Large Joint Inj: L knee  Left knee aspiration and steroid injection today for diagnostic and treatment purposes.  Orders: Orders Placed This Encounter  Procedures   Large Joint Inj: L knee   Synovial Fluid Analysis, Complete   No orders of the defined types were placed in this encounter.    Follow-Up Instructions: No follow-ups on file.   Fuller Plan, MD  Note - This record has been created using [employer].  Chart creation errors have been sought, but may not always  have been located. Such creation errors do not  reflect on  the standard of medical care.

## 2023-05-03 NOTE — Assessment & Plan Note (Signed)
Lab Results  Component Value Date   HGBA1C 6.4 12/31/2022   HGBA1C 6.8 (H) 09/20/2022   HGBA1C 6.5 05/13/2022   Lab Results  Component Value Date   MICROALBUR 1.0 09/20/2022   LDLCALC 96 09/20/2022   CREATININE 0.70 03/04/2023   Diet controlled. Update A1C.

## 2023-05-03 NOTE — Assessment & Plan Note (Signed)
She continues zyrtec and report that symptoms are well controlled.

## 2023-05-03 NOTE — Progress Notes (Addendum)
Subjective:   By signing my name below, I, [name], attest that this documentation has been prepared under the direction and in the presence of Lemont Fillers, NP 05/07/23   Patient ID: [name], female    DOB: [birth year], 57 y.o.   MRN: [id number]  No chief complaint on file.   HPI Patient is in today for a 4 month follow up.   She saw Dr. Dimple Casey today (Rheumatology).  He was concerned about thyromegaly and asked pt to follow up with PCP about this.   Asthma- feels great on Dupixent. Continues to follow with pulmonology- Dr. Maple Hudson.   HTN- maintained o nlisinopril.  BP Readings from Last 3 Encounters:  05/03/23 127/72  05/03/23 128/83  03/04/23 130/79   She is back on Cymbalta- tolerating. Her rheumatologist gave her this to help with her joint pain.       Past Medical History:  Diagnosis Date   Allergic rhinitis, cause unspecified    Allergy    Asthma    Clotting disorder (HCC)    DVT 2005   Diabetes type 2, controlled (HCC)    pt denies- no meds- Pre DM-    History of DVT (deep vein thrombosis) 2005   Pt reports blood clot below knee ?unsure which leg.   Hyperlipidemia    Nasal polyps    Osteopenia    Unspecified essential hypertension     Past Surgical History:  Procedure Laterality Date   COLONOSCOPY     LASIK Bilateral 2008   NASAL SINUS SURGERY  2008   Dr Lazarus Salines   OVARIAN CYST REMOVAL  02/2016   pt reported-- Dr [name]   POLYPECTOMY     UTERINE FIBROID SURGERY  2005    Family History  Problem Relation Age of Onset   Cancer Mother 84       ?carcinoid tumor (had surgical removal)   Diabetes Mother        borderline   Stomach cancer Mother    Liver cancer Mother    Heart disease Father        died 52, from MI, had hx of ETOH abuse and liver disease   Thyroid cancer Sister        (diagnosed at 72) half sister (same dad)    Diabetes type II Brother    Kidney disease Neg Hx    Hypertension Neg Hx    Hyperlipidemia Neg Hx     Colon cancer Neg Hx    Esophageal cancer Neg Hx    Rectal cancer Neg Hx    Colon polyps Neg Hx     Social History   Socioeconomic History   Marital status: Married    Spouse name: Not on file   Number of children: Not on file   Years of education: Not on file   Highest education level: Not on file  Occupational History   Occupation: Production designer, 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     Social Determinants of Health   Financial Resource Strain: Not on file  Food Insecurity: Not on file  Transportation Needs: Not on file  Physical Activity: Not on file  Stress: Not on file  Social Connections: Not on file  Intimate Partner Violence: Not on file    Outpatient Medications Prior to Visit  Medication Sig Dispense Refill   albuterol (PROVENTIL) (2.5 MG/3ML) 0.083% nebulizer solution Take 2.5 mg by nebulization every 4 (four) hours as needed for wheezing or shortness of breath.     albuterol (VENTOLIN HFA) 108 (90 Base) MCG/ACT inhaler INHALE 2 PUFFS BY MOUTH EVERY 6 HOURS AS NEEDED FOR WHEEZING FOR SHORTNESS OF BREATH 27 g 0   Calcium Carbonate-Vit D-Min (CALTRATE 600+D PLUS MINERALS) 600-800 MG-UNIT CHEW Chew 1 tablet by mouth in the morning and at bedtime. 60 tablet    cetirizine (ZYRTEC) 10 MG tablet Take 10 mg by mouth daily.     DULoxetine (CYMBALTA) 30 MG capsule Take 30 mg by mouth daily.     Dupilumab (DUPIXENT) 300 MG/2ML SOPN INJECT 300MG   INTO THE SKIN EVERY 14 DAYS 4 mL 5   fluticasone (FLONASE) 50 MCG/ACT nasal spray 2 sprays each nostril daily 16 g 12   Fluticasone-Umeclidin-Vilant (TRELEGY ELLIPTA) 100-62.5-25 MCG/ACT AEPB Inhale 1 puff into the lungs daily. 60 each 11   hydroxychloroquine (PLAQUENIL) 200 MG tablet Take 200 mg by mouth 2 (two) times daily.     lisinopril (ZESTRIL) 10 MG tablet Take 1 tablet (10 mg total) by mouth daily. 30 tablet 3   Prenatal Vit-Fe Sulfate-FA (PRENATAL VITAMIN PO) Take 1 capsule by mouth daily.     predniSONE (DELTASONE) 10 MG tablet See admin instructions.     No facility-administered medications prior to visit.    Allergies  Allergen Reactions   Aspirin     wheezing   Latex Hives   Penicillins Hives   Shellfish Allergy    Shellfish-Derived Products Other (See Comments)    ROS See HPI    Objective:    Physical Exam Constitutional:      Appearance: She is well-developed.  Cardiovascular:     Rate and Rhythm: Normal rate and regular rhythm.     Heart sounds: Normal heart sounds. No murmur heard. Pulmonary:     Effort: Pulmonary effort is normal. No respiratory distress.     Breath sounds: Normal breath sounds. No wheezing.  Psychiatric:        Behavior: Behavior normal.        Thought Content: Thought content normal.        Judgment: Judgment normal.     BP 127/72 (BP Location: Right Arm, Patient Position: Sitting, Cuff Size: Small)   Pulse (!) 104   Temp 98 F (36.7 C) (Oral)   Resp 16   Wt 200 lb (90.7 kg)   SpO2 98%   BMI 31.32 kg/m  Wt Readings from Last 3 Encounters:  05/03/23 200 lb (90.7 kg)  05/03/23 199 lb (90.3 kg)  03/04/23 196 lb (88.9 kg)       Assessment & Plan:  Controlled type 2 diabetes mellitus without complication, without long-term current use of insulin (HCC) Assessment & Plan: Lab Results  Component Value Date   HGBA1C 6.4 12/31/2022   HGBA1C 6.8 (H) 09/20/2022   HGBA1C 6.5 05/13/2022   Lab Results  Component Value Date    MICROALBUR 1.0 09/20/2022   LDLCALC 96 09/20/2022   CREATININE 0.70 03/04/2023   Diet controlled. Update A1C.   Orders: -     Hemoglobin A1c  Seasonal and perennial allergic rhinitis Assessment & Plan: She continues zyrtec and report that symptoms are well controlled.    Thyromegaly -     US THYROID; Future     I,Rachel Rivera,acting as a scribe for Lemont Fillers, NP.,have documented all relevant documentation on the behalf of Lemont Fillers, NP,as directed by  Lemont Fillers, NP while in the presence of Lemont Fillers, NP.   I, Lemont Fillers, NP, personally preformed the services described in this documentation.  All medical record entries made by the scribe were at my direction and in my presence.  I have reviewed the chart and discharge instructions (if applicable) and agree that the record reflects my personal performance and is accurate and complete. 05/07/23   Lemont Fillers, NP

## 2023-05-04 LAB — HEMOGLOBIN A1C: Hgb A1c MFr Bld: 6.1 % (ref 4.6–6.5)

## 2023-05-04 NOTE — Telephone Encounter (Signed)
Both requested electronically

## 2023-05-06 ENCOUNTER — Other Ambulatory Visit: Payer: Self-pay | Admitting: *Deleted

## 2023-05-06 DIAGNOSIS — M1990 Unspecified osteoarthritis, unspecified site: Secondary | ICD-10-CM

## 2023-05-06 DIAGNOSIS — M25462 Effusion, left knee: Secondary | ICD-10-CM

## 2023-05-06 NOTE — Progress Notes (Signed)
Knee synovial fluid shows very few white blood cells. This is usually more consistent with wear and tear or injury related arthritis rather than inflammatory disease. I believe getting an MRI of the left knee would be the best next step to try to figure out the persistent swelling on that side.

## 2023-05-07 DIAGNOSIS — E010 Iodine-deficiency related diffuse (endemic) goiter: Secondary | ICD-10-CM | POA: Insufficient documentation

## 2023-05-07 NOTE — Assessment & Plan Note (Signed)
New. Will obtain US for further evaluation.  

## 2023-05-08 ENCOUNTER — Ambulatory Visit (HOSPITAL_BASED_OUTPATIENT_CLINIC_OR_DEPARTMENT_OTHER)
Admission: RE | Admit: 2023-05-08 | Discharge: 2023-05-08 | Disposition: A | Discharge status: Home / Self Care | Payer: BC Managed Care – PPO | Source: Ambulatory Visit | Attending: Family | Admitting: Family

## 2023-05-08 DIAGNOSIS — E049 Nontoxic goiter, unspecified: Secondary | ICD-10-CM | POA: Diagnosis not present

## 2023-05-08 DIAGNOSIS — E010 Iodine-deficiency related diffuse (endemic) goiter: Secondary | ICD-10-CM | POA: Diagnosis not present

## 2023-05-11 ENCOUNTER — Telehealth: Payer: Self-pay

## 2023-05-11 MED ORDER — TRIAMCINOLONE ACETONIDE 40 MG/ML IJ SUSP
40.0000 mg | INTRAMUSCULAR | Status: AC | PRN
Start: 2023-05-03 — End: 2023-05-03
  Administered 2023-05-03: 40 mg via INTRA_ARTICULAR

## 2023-05-11 MED ORDER — LIDOCAINE HCL 1 % IJ SOLN
3.0000 mL | INTRAMUSCULAR | Status: AC | PRN
Start: 2023-05-03 — End: 2023-05-03
  Administered 2023-05-03: 3 mL

## 2023-05-11 NOTE — Telephone Encounter (Signed)
Patient contacted the office and states she is ready to schedule a knee  MRI. After reviewing the referral, advised patient the first MRI was denied. Advised the patient office notes have been sent in and now we are awaiting a response from insurance.   Patient states she was given a cortisone shot in her left knee at her last office visit. Patient states the shot helped her greatly. Patient inquires how often she can get cortisone injections. Patient states she has more questions for Dr. Dimple Casey and would like a call from him. Patient call back number is [phone]. Please advise.

## 2023-05-11 NOTE — Telephone Encounter (Signed)
I spoke with Ms. sides she has great relief in the knee pain since aspiration and injection.  I recommend we cannot predict the exact duration of benefit for this especially having never tried intra-articular steroid injection for her before.  Would anticipate a minimum of several weeks improvement but benefit may last longer for many months. Fluid analysis showed minimal elevation in white blood cells so I think more due to a structural problem with osteoarthritis or may be cartilage or ligament damage.  Initial MRI order was declined by insurance awaiting letter for this now.  She is not able to take NSAIDs due to worsening asthma.  If initial treatment course of physical therapy is recommended first, that would be reasonable.

## 2023-05-15 ENCOUNTER — Other Ambulatory Visit (HOSPITAL_BASED_OUTPATIENT_CLINIC_OR_DEPARTMENT_OTHER): Payer: BC Managed Care – PPO

## 2023-05-15 ENCOUNTER — Ambulatory Visit (HOSPITAL_BASED_OUTPATIENT_CLINIC_OR_DEPARTMENT_OTHER): Payer: BC Managed Care – PPO

## 2023-05-17 ENCOUNTER — Telehealth: Payer: Self-pay | Admitting: Internal Medicine

## 2023-05-17 NOTE — Telephone Encounter (Signed)
Pt needs a refill for her Prednisone, Pt states her pharmacy state she the prescription is only for 90 and she needs 200  [facility]: [facility] [address]

## 2023-05-18 NOTE — Telephone Encounter (Addendum)
Okay to send in prednisone for 200 instead of 90?

## 2023-05-18 NOTE — Telephone Encounter (Signed)
Ok to change script to # 200 tabs

## 2023-05-19 ENCOUNTER — Other Ambulatory Visit: Payer: Self-pay

## 2023-05-19 ENCOUNTER — Ambulatory Visit: Payer: BC Managed Care – PPO | Admitting: Internal Medicine

## 2023-05-19 MED ORDER — PREDNISONE 10 MG PO TABS: 10.0000 mg | ORAL_TABLET | Freq: Every day | ORAL | 0 refills | Status: DC

## 2023-05-19 NOTE — Telephone Encounter (Signed)
Please advise prednisone has been sent to pharmacy

## 2023-05-19 NOTE — Telephone Encounter (Signed)
ATC X1 unable to leave vm. Please advise prednisone has been sent to pharmacy

## 2023-05-25 NOTE — Telephone Encounter (Signed)
Spoke with patient. She states she is currently on vacation and will pick up prescription when she comes back. Closing encounter. NFN

## 2023-06-01 ENCOUNTER — Telehealth: Payer: Self-pay

## 2023-06-01 NOTE — Telephone Encounter (Addendum)
PA renewal initiated automatically by CoverMyMeds.  Submitted a Prior Authorization request to [facility] for DUPIXENT via CoverMyMeds. Will update once we receive a response.   Key: [id number]   Patient's last OV was on 11/18/2022, which puts the pt at an increased risk for renewal to be denied.

## 2023-06-03 NOTE — Telephone Encounter (Signed)
Patient Advocate Encounter  Prior Authorization for Dupixent has been approved with [employer].    PA# Key: [id number] PA Case ID #: [id number] Effective dates: 06/01/23 through 05/31/24

## 2023-06-05 ENCOUNTER — Other Ambulatory Visit: Payer: Self-pay | Admitting: Obstetrics and Gynecology

## 2023-06-05 DIAGNOSIS — Z01818 Encounter for other preprocedural examination: Secondary | ICD-10-CM

## 2023-06-06 ENCOUNTER — Other Ambulatory Visit: Payer: BC Managed Care – PPO

## 2023-06-07 ENCOUNTER — Telehealth: Payer: Self-pay | Admitting: Internal Medicine

## 2023-06-07 NOTE — Telephone Encounter (Signed)
LMOM returning patient's call, MRI approved 07/08/2023, 06/06/2023 appt cancelled?

## 2023-06-07 NOTE — Telephone Encounter (Signed)
Pt left voicemail requesting a call from the nurse.

## 2023-06-13 ENCOUNTER — Ambulatory Visit
Admission: RE | Admit: 2023-06-13 | Discharge: 2023-06-13 | Disposition: A | Discharge status: Home / Self Care | Payer: BC Managed Care – PPO | Source: Ambulatory Visit | Attending: Internal Medicine | Admitting: Internal Medicine

## 2023-06-13 DIAGNOSIS — M1712 Unilateral primary osteoarthritis, left knee: Secondary | ICD-10-CM | POA: Diagnosis not present

## 2023-06-13 DIAGNOSIS — M25462 Effusion, left knee: Secondary | ICD-10-CM

## 2023-06-13 DIAGNOSIS — S83272A Complex tear of lateral meniscus, current injury, left knee, initial encounter: Secondary | ICD-10-CM | POA: Diagnosis not present

## 2023-06-13 DIAGNOSIS — M1990 Unspecified osteoarthritis, unspecified site: Secondary | ICD-10-CM

## 2023-06-16 ENCOUNTER — Other Ambulatory Visit: Payer: Self-pay

## 2023-06-16 MED ORDER — ALBUTEROL SULFATE HFA 108 (90 BASE) MCG/ACT IN AERS: INHALATION_SPRAY | RESPIRATORY_TRACT | 11 refills | Status: DC

## 2023-06-24 ENCOUNTER — Other Ambulatory Visit: Payer: Self-pay | Admitting: Family

## 2023-06-28 ENCOUNTER — Ambulatory Visit: Payer: BC Managed Care – PPO | Admitting: Internal Medicine

## 2023-06-29 ENCOUNTER — Telehealth: Payer: Self-pay | Admitting: Internal Medicine

## 2023-06-29 DIAGNOSIS — M25462 Effusion, left knee: Secondary | ICD-10-CM

## 2023-06-29 NOTE — Telephone Encounter (Signed)
Addressed in MRI result note today.

## 2023-06-29 NOTE — Telephone Encounter (Signed)
PT is not understanding the notes that were given to her in mychart about her MRI and would like further information.

## 2023-06-29 NOTE — Telephone Encounter (Signed)
See results notes for details.  

## 2023-06-29 NOTE — Telephone Encounter (Signed)
-----   Message from Fuller Plan, MD sent at 06/29/2023  4:56 PM EDT ----- MRI of the left knee shows moderately severe osteoarthritis and there is degenerative tearing of the meniscus. This is a probable cause for joint pain at the knee and recurrent swelling. I recommend referral to orthopedic surgery for this.

## 2023-06-29 NOTE — Progress Notes (Signed)
MRI of the left knee shows moderately severe osteoarthritis and there is degenerative tearing of the meniscus. This is a probable cause for joint pain at the knee and recurrent swelling. I recommend referral to orthopedic surgery for this.

## 2023-07-05 ENCOUNTER — Other Ambulatory Visit: Payer: Self-pay | Admitting: Internal Medicine

## 2023-07-05 ENCOUNTER — Other Ambulatory Visit: Payer: Self-pay | Admitting: Family

## 2023-07-05 NOTE — Telephone Encounter (Signed)
Last Fill: 02/04/2023  Next Visit: not on file  Last Visit: 05/03/2023  Dx: Continued joint pain at multiple areas   Current Dose per office note on 05/03/2023: not discussed  Okay to refill Cymbalta?  When should patient follow up?

## 2023-07-15 ENCOUNTER — Telehealth: Payer: Self-pay

## 2023-07-15 ENCOUNTER — Ambulatory Visit: Payer: BC Managed Care – PPO | Admitting: Surgical

## 2023-07-15 DIAGNOSIS — M1712 Unilateral primary osteoarthritis, left knee: Secondary | ICD-10-CM | POA: Diagnosis not present

## 2023-07-15 NOTE — Telephone Encounter (Signed)
Can we get this patient approved for L knee gel inj

## 2023-07-16 ENCOUNTER — Encounter: Payer: Self-pay | Admitting: Surgical

## 2023-07-16 NOTE — Progress Notes (Signed)
Office Visit Note   Patient: [name]           Date of Birth: 13-Jun-1966           MRN: [id number] Visit Date: 07/15/2023 Requested by: Fuller Plan, MD [address] [place],  [place] [address] PCP: Sandford Craze, NP  Subjective: Chief Complaint  Patient presents with   Left Knee - Pain    HPI: [name] is a 57 y.o. female who presents to the office reporting left knee pain and swelling.  She describes pain in the left knee localizing to the lateral aspect of the knee but especially to the lateral aspect of the proximal calf.  She has occasional low back pain without any radicular pain.  She denies any groin pain.  No history of prior left leg surgery.  She sees Dr. Dimple Casey who aspirated her left knee and administered cortisone injection which gave her great relief of both her lateral knee pain and her lateral calf pain.  This injection was administered in mid May 2024.  She has positive ANA and takes hydroxychloroquine as prescribed by Dr. Dimple Casey.  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  Does have occasional popping sensation in the left knee but no mechanical locking of the knee.              ROS: All systems reviewed are negative as they relate to the chief complaint within the history of present illness.  Patient denies fevers or chills.  Assessment & Plan: Visit Diagnoses:  1. Arthritis of left knee     Plan: Patient is a 57 year old female who presents for evaluation of left knee pain.  She has been seeing Dr. Dimple Casey and takes hydroxychloroquine.  Had recent aspiration/injection with cortisone on 5/14 that gave her great relief of both her lateral knee pain and lateral calf pain.  Typically the calf pain bothers her more but since this resolved for a time with knee injection, this is likely referred pain from her knee.  We went over her MRI that was ordered by Dr. Dimple Casey which  demonstrates degenerative tear of the lateral meniscus with moderate degenerative changes of the lateral compartment.  There is also a cystic structure posterior to the knee joint just anterior to the medial head of the gastroc.  With the significant improvement that she has gotten from left knee injection and aspiration, would hold off on any intervention for now and approve her for gel injection for the left knee.  Plan to administer this in about a month and reevaluate how she is doing which will be about 3 months from her cortisone injection.    This patient is diagnosed with osteoarthritis of the knee(s).    Radiographs show evidence of joint space narrowing, osteophytes, subchondral sclerosis and/or subchondral cysts.  This patient has knee pain which interferes with functional and activities of daily living.    This patient has experienced inadequate response, adverse effects and/or intolerance with conservative treatments such as acetaminophen, NSAIDS, topical creams, physical therapy or regular exercise, knee bracing and/or weight loss.   This patient has experienced inadequate response or has a contraindication to intra articular steroid injections for at least 3 months.   This patient is not scheduled to have a total knee replacement within 6 months of starting treatment with viscosupplementation.   Follow-Up Instructions: No follow-ups on file.  Orders:  No orders of the defined types were placed in this encounter.  No orders of the defined types were placed in this encounter.     Procedures: No procedures performed   Clinical Data: No additional findings.  Objective: Vital Signs: There were no vitals taken for this visit.  Physical Exam:  Constitutional: Patient appears well-developed HEENT:  Head: Normocephalic Eyes:EOM are normal Neck: Normal range of motion Cardiovascular: Normal rate Pulmonary/chest: Effort normal Neurologic: Patient is alert Skin: Skin is  warm Psychiatric: Patient has normal mood and affect  Ortho Exam: Ortho exam demonstrates left knee with positive effusion.  She has hyperextension to about 3 degrees with flexion to about 120 degrees.  There is a little bit of patellofemoral crepitus with passive motion and active extension of the knee.  She has excellent quad strength rated 5/5.  No pain with hip range of motion.  Negative FADIR sign.  No calf tenderness.  Negative Homans' sign.  She has mild to moderate tenderness over the lateral joint line and no tenderness over the medial joint line.  Minimal tenderness over the pes anserine bursa.  No tenderness over the patellar tendon, patella, quadricep tendon.  Stable to anterior and posterior drawer sign.    Specialty Comments:  No specialty comments available.  Imaging: No results found.   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   History of sinus surgery 07/31/2021   Nasal polyps    Hyperlipidemia    Clotting disorder (HCC)    Allergy    Severe persistent asthma 03/24/2021   Steroid dependence (HCC) 04/01/2020   Diabetes type 2, controlled (HCC)    Chronic venous insufficiency 09/16/2016   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   Uterine leiomyoma 05/25/2016   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   Rectal bleeding 10/20/2015   Obesity 04/27/2015   Abnormal EKG 03/19/2015   Preventative health care 02/17/2015   OCD (obsessive compulsive disorder) 01/14/2015   Essential hypertension 01/13/2015   Peripheral edema 06/17/2012   NASAL POLYP  01/01/2008   Seasonal and perennial allergic rhinitis 01/01/2008   Moderate persistent asthma 01/01/2008   DEEP VENOUS THROMBOPHLEBITIS, HX OF 01/01/2008   History of DVT (deep vein thrombosis) 2005   Past Medical History:  Diagnosis Date   Allergic rhinitis, cause unspecified    Allergy    Asthma    Clotting disorder (HCC)    DVT 2005   Diabetes type 2, controlled (HCC)    pt denies- no meds- Pre DM-    History of DVT (deep vein thrombosis) 2005   Pt reports blood clot below knee ?unsure which leg.   Hyperlipidemia    Nasal polyps    Osteopenia    Unspecified essential hypertension     Family History  Problem Relation Age of Onset   Cancer Mother 85       ?carcinoid tumor (had surgical removal)   Diabetes Mother        borderline   Stomach cancer Mother    Liver cancer Mother    Heart disease Father        died 72,  from MI, had hx of ETOH abuse and liver disease   Thyroid cancer Sister        (diagnosed at 32) half sister (same dad)    Diabetes type II Brother    Kidney disease Neg Hx    Hypertension Neg Hx    Hyperlipidemia Neg Hx    Colon cancer Neg Hx    Esophageal cancer Neg Hx    Rectal cancer Neg Hx    Colon polyps Neg Hx     Past Surgical History:  Procedure Laterality Date   COLONOSCOPY     LASIK Bilateral 2008   NASAL SINUS SURGERY  2008   Dr Lazarus Salines   OVARIAN CYST REMOVAL  02/2016   pt reported-- Dr [name]   POLYPECTOMY     UTERINE FIBROID SURGERY  2005   Social History   Occupational History   Occupation: Production designer, theatre/television/film [employer]  Tobacco Use   Smoking status: Never    Passive exposure: Past   Smokeless tobacco: Never  Vaping Use   Vaping status: Never Used  Substance and Sexual Activity   Alcohol use: Yes    Alcohol/week: 0.0 standard drinks of alcohol    Comment: 1 glass of wine per year   Drug use: No   Sexual activity: Yes    Birth control/protection: None

## 2023-07-20 ENCOUNTER — Encounter (INDEPENDENT_AMBULATORY_CARE_PROVIDER_SITE_OTHER): Payer: Self-pay

## 2023-07-20 NOTE — Telephone Encounter (Signed)
VOB submitted for Orthovisc, left knee.  

## 2023-07-22 ENCOUNTER — Ambulatory Visit: Payer: BC Managed Care – PPO | Admitting: Internal Medicine

## 2023-08-04 ENCOUNTER — Other Ambulatory Visit: Payer: Self-pay | Admitting: Internal Medicine

## 2023-08-04 NOTE — Telephone Encounter (Signed)
Last Fill: 07/06/2023  Next Visit: follow up not mentioned  Last Visit: 05/03/2023  Dx: Osteoarthritis, unspecified osteoarthritis type, unspecified site   Current Dose per office note on 05/03/2023: not mentioned  Okay to refill Cymbalta?

## 2023-08-09 ENCOUNTER — Ambulatory Visit: Payer: BC Managed Care – PPO | Admitting: Family

## 2023-08-12 DIAGNOSIS — N760 Acute vaginitis: Secondary | ICD-10-CM | POA: Diagnosis not present

## 2023-08-12 DIAGNOSIS — Z6831 Body mass index (BMI) 31.0-31.9, adult: Secondary | ICD-10-CM | POA: Diagnosis not present

## 2023-08-12 DIAGNOSIS — Z1231 Encounter for screening mammogram for malignant neoplasm of breast: Secondary | ICD-10-CM | POA: Diagnosis not present

## 2023-08-12 DIAGNOSIS — Z01419 Encounter for gynecological examination (general) (routine) without abnormal findings: Secondary | ICD-10-CM | POA: Diagnosis not present

## 2023-08-12 DIAGNOSIS — Z124 Encounter for screening for malignant neoplasm of cervix: Secondary | ICD-10-CM | POA: Diagnosis not present

## 2023-08-12 LAB — HM MAMMOGRAPHY

## 2023-08-15 ENCOUNTER — Telehealth: Payer: Self-pay | Admitting: Surgical

## 2023-08-15 NOTE — Telephone Encounter (Signed)
Patient called needing to know if she was approved for the gel injection? The number to contact patient is [phone]

## 2023-08-16 NOTE — Telephone Encounter (Signed)
Talked with patient and advised her that her insurance [employer] [place], does not cover any gel injections.  Advised of Trivisc for left knee and this has been submitted.  Patient is aware that she will receive a call from [facility] to pay for medication.

## 2023-08-17 LAB — HM PAP SMEAR
HM Pap smear: NEGATIVE
HPV, high-risk: NEGATIVE

## 2023-08-23 ENCOUNTER — Telehealth: Payer: Self-pay

## 2023-08-23 NOTE — Telephone Encounter (Signed)
Talked with patient concerning TriVisc injection.  Advised patient that once medication is received, I will call her to schedule.  Patient voiced that she understands.

## 2023-08-26 ENCOUNTER — Other Ambulatory Visit: Payer: Self-pay

## 2023-08-26 DIAGNOSIS — J4550 Severe persistent asthma, uncomplicated: Secondary | ICD-10-CM

## 2023-08-29 ENCOUNTER — Ambulatory Visit: Payer: BC Managed Care – PPO | Admitting: Internal Medicine

## 2023-08-30 ENCOUNTER — Telehealth (INDEPENDENT_AMBULATORY_CARE_PROVIDER_SITE_OTHER): Payer: BC Managed Care – PPO | Admitting: Family

## 2023-08-30 ENCOUNTER — Other Ambulatory Visit: Payer: Self-pay

## 2023-08-30 ENCOUNTER — Encounter (HOSPITAL_BASED_OUTPATIENT_CLINIC_OR_DEPARTMENT_OTHER): Payer: Self-pay | Admitting: Obstetrics and Gynecology

## 2023-08-30 ENCOUNTER — Ambulatory Visit: Payer: BC Managed Care – PPO | Admitting: Physician Assistant

## 2023-08-30 ENCOUNTER — Telehealth: Payer: Self-pay | Admitting: Family

## 2023-08-30 ENCOUNTER — Other Ambulatory Visit (INDEPENDENT_AMBULATORY_CARE_PROVIDER_SITE_OTHER): Payer: BC Managed Care – PPO

## 2023-08-30 ENCOUNTER — Encounter: Payer: Self-pay | Admitting: Physician Assistant

## 2023-08-30 DIAGNOSIS — G8929 Other chronic pain: Secondary | ICD-10-CM | POA: Diagnosis not present

## 2023-08-30 DIAGNOSIS — M25562 Pain in left knee: Secondary | ICD-10-CM | POA: Insufficient documentation

## 2023-08-30 DIAGNOSIS — Z86718 Personal history of other venous thrombosis and embolism: Secondary | ICD-10-CM

## 2023-08-30 DIAGNOSIS — E119 Type 2 diabetes mellitus without complications: Secondary | ICD-10-CM | POA: Diagnosis not present

## 2023-08-30 DIAGNOSIS — I10 Essential (primary) hypertension: Secondary | ICD-10-CM | POA: Diagnosis not present

## 2023-08-30 DIAGNOSIS — E782 Mixed hyperlipidemia: Secondary | ICD-10-CM

## 2023-08-30 DIAGNOSIS — J4489 Other specified chronic obstructive pulmonary disease: Secondary | ICD-10-CM | POA: Diagnosis not present

## 2023-08-30 DIAGNOSIS — M1712 Unilateral primary osteoarthritis, left knee: Secondary | ICD-10-CM

## 2023-08-30 LAB — COMPREHENSIVE METABOLIC PANEL
ALT: 17 U/L (ref 0–35)
AST: 19 U/L (ref 0–37)
Albumin: 3.7 g/dL (ref 3.5–5.2)
Alkaline Phosphatase: 108 U/L (ref 39–117)
BUN: 12 mg/dL (ref 6–23)
CO2: 29 meq/L (ref 19–32)
Calcium: 9.6 mg/dL (ref 8.4–10.5)
Chloride: 105 meq/L (ref 96–112)
Creatinine, Ser: 0.63 mg/dL (ref 0.40–1.20)
GFR: 99.6 mL/min (ref >60.00)
Glucose, Bld: 93 mg/dL (ref 70–99)
Potassium: 4.1 meq/L (ref 3.5–5.1)
Sodium: 140 meq/L (ref 135–145)
Total Bilirubin: 0.5 mg/dL (ref 0.2–1.2)
Total Protein: 6.4 g/dL (ref 6.0–8.3)

## 2023-08-30 LAB — MICROALBUMIN / CREATININE URINE RATIO
Creatinine,U: 155.8 mg/dL
Microalb Creat Ratio: 0.4 mg/g (ref 0.0–30.0)
Microalb, Ur: <0.7 mg/dL (ref 0.0–1.9)

## 2023-08-30 LAB — LIPID PANEL
Cholesterol: 160 mg/dL (ref 0–200)
HDL: 40.8 mg/dL (ref >39.00)
LDL Cholesterol: 88 mg/dL (ref 0–99)
NonHDL: 118.88
Total CHOL/HDL Ratio: 4
Triglycerides: 154 mg/dL — ABNORMAL HIGH (ref 0.0–149.0)
VLDL: 30.8 mg/dL (ref 0.0–40.0)

## 2023-08-30 LAB — HEMOGLOBIN A1C: Hgb A1c MFr Bld: 6.6 % — ABNORMAL HIGH (ref 4.6–6.5)

## 2023-08-30 MED ORDER — SODIUM HYALURONATE (VISCOSUP) 25 MG/2.5ML IX SOSY
25.0000 mg | PREFILLED_SYRINGE | INTRA_ARTICULAR | Status: AC | PRN
Start: 2023-08-30 — End: 2023-08-30
  Administered 2023-08-30: 25 mg via INTRA_ARTICULAR

## 2023-08-30 NOTE — Assessment & Plan Note (Addendum)
BP Readings from Last 3 Encounters:  05/03/23 127/72  05/03/23 128/83  03/04/23 130/79   Maintained on lisinopril. Advised pt to check her reading at home and send me via mychart.

## 2023-08-30 NOTE — Telephone Encounter (Signed)
Please call [facility] on [facility] eye doctor and request last DM eye exam.  Also, request mammogram/pap smear from Dr. Kittie Plater office.

## 2023-08-30 NOTE — Progress Notes (Signed)
MyChart Video Visit    Virtual Visit via Video Note    Patient location: Home. Patient and provider in visit Provider location: Office  I discussed the limitations of evaluation and management by telemedicine and the availability of in person appointments. The patient expressed understanding and agreed to proceed.  Visit Date: 08/30/2023  Today's healthcare provider: Lemont Fillers, NP     Subjective:    Patient ID: [name], female    DOB: 1966/10/14, 57 y.o.   MRN: [id number]  Chief Complaint  Patient presents with   Diabetes    For follow up    HPI  The patient, with a history of diabetes and asthma, presents for a follow-up visit. Last visit her A1c had improved from 6.8 to 6.1, which she attributes to dietary changes and discontinuation of prednisone. She has also been seeing a rheumatologist and an orthopedic doctor for swelling in her left leg, which is believed to be originating from her knee. She has had fluid drained from her knee a few months ago and is scheduled to receive a gel injection for further treatment.  Her asthma has been well-controlled with Dupixent and Trellegy, which she describes as a significant improvement in her quality of life. She is also scheduled for a hysterectomy due to recurrent fibroids, which have been causing discomfort. She has a history of a blood clot in 2005, which occurred spontaneously, and reports that she will be receiving Lovenox postoperatively for prophylaxis.  She has been experiencing occasional dizzy spells, similar to previous episodes, but has not checked her blood pressure recently. She has a home blood pressure monitor and will send readings to the doctor. Her last eye exam was in December of the previous year, and she recently had a mammogram and Pap smear, the results of which are pending.  She has been experiencing some lower back pain, which she is concerned may be related to kidney issues, although  she acknowledges it could be muscular. She is due for a cholesterol check, as it has been almost a year since her last one. She is currently off prednisone, only taking it for flare-ups, which have been infrequent.  Past Medical History:  Diagnosis Date   Allergic rhinitis, cause unspecified    Allergy    Asthma    Clotting disorder (HCC)    DVT 2005   Diabetes type 2, controlled (HCC)    pt denies- no meds- Pre DM-    History of DVT (deep vein thrombosis) 2005   Pt reports blood clot below knee ?unsure which leg.   Hyperlipidemia    Nasal polyps    Osteopenia    Unspecified essential hypertension     Past Surgical History:  Procedure Laterality Date   COLONOSCOPY     LASIK Bilateral 2008   NASAL SINUS SURGERY  2008   Dr Lazarus Salines   OVARIAN CYST REMOVAL  02/2016   pt reported-- Dr [name]   POLYPECTOMY     UTERINE FIBROID SURGERY  2005    Family History  Problem Relation Age of Onset   Cancer Mother 70       ?carcinoid tumor (had surgical removal)   Diabetes Mother        borderline   Stomach cancer Mother    Liver cancer Mother    Heart disease Father        died 68, from MI, had hx of ETOH abuse and liver disease   Thyroid cancer Sister        (  diagnosed at 53) half sister (same dad)    Diabetes type II Brother    Kidney disease Neg Hx    Hypertension Neg Hx    Hyperlipidemia Neg Hx    Colon cancer Neg Hx    Esophageal cancer Neg Hx    Rectal cancer Neg Hx    Colon polyps Neg Hx     Social History   Socioeconomic History   Marital status: Married    Spouse name: Not on file   Number of children: Not on file   Years of education: Not on file   Highest education level: Not on file  Occupational History   Occupation: Production designer, theatre/television/film [employer]  Tobacco Use   Smoking status: Never    Passive exposure: Past   Smokeless tobacco: Never  Vaping Use   Vaping status: Never Used  Substance and Sexual Activity   Alcohol use: Yes    Alcohol/week: 0.0 standard drinks  of alcohol    Comment: 1 glass of wine per year   Drug use: No   Sexual activity: Yes    Birth control/protection: None  Other Topics Concern   Not on file  Social History Narrative   Married   No children   No pets   Works at [employer] as a Scientist, water quality, shopping, 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     Social Determinants of Health   Financial Resource Strain: Not on file  Food Insecurity: Not on file  Transportation Needs: Not on file  Physical Activity: Not on file  Stress: Not on file  Social Connections: Not on file  Intimate Partner Violence: Not on file    Outpatient Medications Prior to Visit  Medication Sig Dispense Refill   albuterol (PROVENTIL) (2.5 MG/3ML) 0.083% nebulizer solution Take 2.5 mg by nebulization every 4 (four) hours as needed for wheezing or shortness of breath.     albuterol (VENTOLIN HFA) 108 (90 Base) MCG/ACT inhaler INHALE 2 PUFFS BY MOUTH EVERY 6 HOURS AS NEEDED FOR WHEEZING FOR SHORTNESS OF BREATH 27 g 11   Calcium Carbonate-Vit D-Min (CALTRATE 600+D PLUS MINERALS) 600-800 MG-UNIT CHEW Chew 1 tablet by mouth in the morning and at bedtime. 60 tablet    cetirizine (ZYRTEC) 10 MG tablet Take 10 mg by mouth daily.     DULoxetine (CYMBALTA) 30 MG capsule Take 1 capsule by mouth once daily 30 capsule 3   Dupilumab (DUPIXENT) 300 MG/2ML SOPN INJECT 300MG  INTO THE SKIN EVERY 14 DAYS 4 mL 5   fluticasone (FLONASE) 50 MCG/ACT nasal spray 2 sprays each nostril daily 16 g 12   Fluticasone-Umeclidin-Vilant (TRELEGY ELLIPTA) 100-62.5-25 MCG/ACT AEPB Inhale 1 puff into the lungs daily. 60 each 11   hydroxychloroquine (PLAQUENIL) 200 MG tablet Take 200 mg by mouth 2 (two) times daily.     lisinopril (ZESTRIL) 10 MG tablet Take 1 tablet by mouth once daily 90 tablet 0   predniSONE (DELTASONE) 10 MG tablet Take 1 tablet (10 mg total) by mouth daily with breakfast. Use sparingly as directed 200 tablet 0   Prenatal Vit-Fe  Sulfate-FA (PRENATAL VITAMIN PO) Take 1 capsule by mouth daily.     No facility-administered medications prior to visit.    Allergies  Allergen Reactions   Aspirin     wheezing   Latex Hives   Penicillins Hives   Shellfish Allergy    Shellfish-Derived Products Other (See Comments)    ROS  See HPI Objective:    Physical Exam  There were no vitals taken for this visit. Wt Readings from Last 3 Encounters:  05/03/23 200 lb (90.7 kg)  05/03/23 199 lb (90.3 kg)  03/04/23 196 lb (88.9 kg)   Gen: Awake, alert, no acute distress Resp: Breathing is even and non-labored Psych: calm/pleasant demeanor Neuro: Alert and Oriented x 3, + facial symmetry, speech is clear.      Assessment & Plan:   Problem List Items Addressed This Visit       Unprioritized   Hyperlipidemia    Lab Results  Component Value Date   CHOL 174 09/20/2022   HDL 44.00 09/20/2022   LDLCALC 96 09/20/2022   TRIG 170.0 (H) 09/20/2022   CHOLHDL 4 09/20/2022  Last LDL at goal. Not currently on statin. Will update lipid panel.        Relevant Orders   Lipid panel   History of DVT (deep vein thrombosis)    She reports that she will be placed on lovenox dvt prophylaxis post operatively by GYN.      Essential hypertension    BP Readings from Last 3 Encounters:  05/03/23 127/72  05/03/23 128/83  03/04/23 130/79   Maintained on lisinopril. Advised pt to check her reading at home and send me via mychart.        Diabetes type 2, controlled (HCC) - Primary    Lab Results  Component Value Date   HGBA1C 6.1 05/03/2023   HGBA1C 6.4 12/31/2022   HGBA1C 6.8 (H) 09/20/2022   Lab Results  Component Value Date   MICROALBUR 1.0 09/20/2022   LDLCALC 96 09/20/2022   CREATININE 0.70 03/04/2023   Last A1C at goal. Continue DM diet.      Relevant Orders   HgB A1c   Comp Met (CMET)   Urine Microalbumin w/creat. ratio   Asthma-COPD overlap syndrome    Much improved on dupixent/trellegy. Management  per pulmonology.        I am having [name] maintain her Prenatal Vit-Fe Sulfate-FA (PRENATAL VITAMIN PO), cetirizine, albuterol, fluticasone, Trelegy Ellipta, Caltrate 600+D Plus Minerals, Dupixent, hydroxychloroquine, predniSONE, albuterol, lisinopril, and DULoxetine.  No orders of the defined types were placed in this encounter.   I discussed the assessment and treatment plan with the patient. The patient was provided an opportunity to ask questions and all were answered. The patient agreed with the plan and demonstrated an understanding of the instructions.   The patient was advised to call back or seek an in-person evaluation if the symptoms worsen or if the condition fails to improve as anticipated.    Lemont Fillers, NP [facility] [phone] (phone) [phone] (fax)  [facility]

## 2023-08-30 NOTE — Telephone Encounter (Signed)
Electronic request made 

## 2023-08-30 NOTE — Assessment & Plan Note (Addendum)
Much improved on dupixent/trellegy. Management per pulmonology.

## 2023-08-30 NOTE — Assessment & Plan Note (Addendum)
Lab Results  Component Value Date   CHOL 174 09/20/2022   HDL 44.00 09/20/2022   LDLCALC 96 09/20/2022   TRIG 170.0 (H) 09/20/2022   CHOLHDL 4 09/20/2022  Last LDL at goal. Not currently on statin. Will update lipid panel.

## 2023-08-30 NOTE — Assessment & Plan Note (Addendum)
Lab Results  Component Value Date   HGBA1C 6.1 05/03/2023   HGBA1C 6.4 12/31/2022   HGBA1C 6.8 (H) 09/20/2022   Lab Results  Component Value Date   MICROALBUR 1.0 09/20/2022   LDLCALC 96 09/20/2022   CREATININE 0.70 03/04/2023   Last A1C at goal. Continue DM diet.

## 2023-08-30 NOTE — Patient Instructions (Signed)
VISIT SUMMARY:  During your visit, we discussed your diabetes, asthma, knee osteoarthritis, uterine fibroids, and hypertension. We also talked about your general health maintenance. You've made significant improvements in managing your diabetes and asthma, and we have plans in place to address your other health concerns.  YOUR PLAN:  -TYPE 2 DIABETES MELLITUS: Your blood sugar levels have improved, which is a result of your diabetes being well-managed. We will continue with your current treatment and diet, and I will order labs to monitor your blood sugar and kidney function.  -KNEE OSTEOARTHRITIS: The swelling in your left knee may be causing your leg pain. You've recently had fluid drained from your knee and will soon receive a gel injection. Please continue with your orthopedic follow-up and treatment plan.  -ASTHMA: Your asthma symptoms have improved with the use of Dupixent. Please continue taking Dupixent as prescribed.  -UTERINE FIBROIDS: You're scheduled for a hysterectomy due to recurrent fibroids and discomfort. After the surgery, you'll receive Lovenox to prevent blood clots, given your history.  -HYPERTENSION: You've reported occasional dizziness, which could be related to high blood pressure. Please check your blood pressure at home and report the readings to me.  INSTRUCTIONS:  We will request copies of your pap/mammogram from Dr. Kittie Plater office. I recommend getting a flu shot and a COVID-19 booster. I will also order labs, including a cholesterol panel. Please follow up with me after your hysterectomy.

## 2023-08-30 NOTE — Assessment & Plan Note (Signed)
She reports that she will be placed on lovenox dvt prophylaxis post operatively by GYN.

## 2023-08-30 NOTE — Progress Notes (Signed)
Office Visit Note   Patient: [name]           Date of Birth: 03/29/1966           MRN: [id number] Visit Date: 08/30/2023              Requested by: Sandford Craze, NP [address] [place],  [place] [address] PCP: Sandford Craze, NP  No chief complaint on file.     HPI: Patient is a pleasant 57 year old woman with a history of arthritis of her left knee.  She has seen Franky Macho in the past.  She is here to start TriVisc injections into her left knee.  No new injuries no change in status she does think she has a small amount of fluid in the knee wondering if it could be aspirated  Assessment & Plan: Visit Diagnoses:  1. Chronic pain of left knee     Plan: First TriVisc was done without difficulty I reviewed the risks outcomes expectations and side effects.  Was not able to aspirate any fluid I think she just had some soft tissue swelling.  Will follow-up in a week  Follow-Up Instructions: Return in about 1 week (around 09/06/2023).   Ortho Exam  Patient is alert, oriented, no adenopathy, well-dressed, normal affect, normal respiratory effort. Examination of her left knee no erythema no real effusion compartments are soft and compressible  Imaging: No results found. No images are attached to the encounter.  Labs: Lab Results  Component Value Date   HGBA1C 6.1 05/03/2023   HGBA1C 6.4 12/31/2022   HGBA1C 6.8 (H) 09/20/2022   ESRSEDRATE 2 01/25/2023   CRP 1.6 01/25/2023     Lab Results  Component Value Date   ALBUMIN 3.8 09/20/2022   ALBUMIN 3.7 01/21/2022   ALBUMIN 3.7 04/07/2020    No results found for: "MG" No results found for: "VD25OH"  No results found for: "PREALBUMIN"    Latest Ref Rng & Units 02/21/2023   12:34 PM 04/07/2020    8:40 AM 07/12/2018    4:55 PM  CBC EXTENDED  WBC 4.0 - 10.5 K/uL 7.2  6.2  12.0   RBC 3.87 - 5.11 Mil/uL 4.95  5.14  5.33   Hemoglobin 12.0 - 15.0 g/dL 19.1  47.8  29.5   HCT 36.0 - 46.0 % 38.7   41.2  42.3   Platelets 150.0 - 400.0 K/uL 367.0  270.0  319.0   NEUT# 1.4 - 7.7 K/uL 3.7  3.7  7.7   Lymph# 0.7 - 4.0 K/uL 2.5  1.9  2.6      There is no height or weight on file to calculate BMI.  Orders:  No orders of the defined types were placed in this encounter.  No orders of the defined types were placed in this encounter.    Procedures: Large Joint Inj: L knee on 08/30/2023 12:43 PM Indications: pain and diagnostic evaluation Details: 1.5 in superolateral approach  Arthrogram: No  Medications: 25 mg Sodium Hyaluronate (Viscosup) 25 MG/2.5ML Outcome: tolerated well, no immediate complications Procedure, treatment alternatives, risks and benefits explained, specific risks discussed. Consent was given by the patient.    Clinical Data: No additional findings.  ROS:  All other systems negative, except as noted in the HPI. Review of Systems  Objective: Vital Signs: There were no vitals taken for this visit.  Specialty Comments:  No specialty comments available.  PMFS History: Patient Active Problem List   Diagnosis Date Noted  .  Pain in left knee 08/30/2023  . Thyromegaly 05/07/2023  . Effusion, left knee 05/03/2023  . Polyarthralgia 12/27/2022  . Osteoarthritis 09/20/2022  . Osteopenia 07/01/2022  . Hair loss 05/14/2022  . Plantar fasciitis 05/03/2022  . 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 Grief reaction 11/16/2021  . Positive ANA (antinuclear antibody) 10/15/2021  . Myalgia 10/14/2021  . Chronic pansinusitis 08/26/2021  . History of sinus surgery 07/31/2021  . Nasal polyps   . Hyperlipidemia   . Clotting disorder (HCC)   . Allergy   . Severe persistent asthma 03/24/2021  . Steroid dependence (HCC) 04/01/2020  . Diabetes type 2, controlled (HCC)   . Chronic venous insufficiency 09/16/2016  . 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  . Uterine leiomyoma 05/25/2016  . 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  Rectal bleeding 10/20/2015  . Obesity 04/27/2015  . Abnormal EKG 03/19/2015  . Preventative health care 02/17/2015  . OCD (obsessive compulsive disorder) 01/14/2015  . Essential hypertension 01/13/2015  . Peripheral edema 06/17/2012  . NASAL POLYP 01/01/2008  . Seasonal and perennial allergic rhinitis 01/01/2008  . Moderate persistent asthma 01/01/2008  . DEEP VENOUS THROMBOPHLEBITIS, HX OF 01/01/2008  . History of DVT (deep vein thrombosis) 2005   Past Medical History:  Diagnosis Date  . Allergic rhinitis, cause unspecified   . Allergy   . Asthma   . Clotting disorder (HCC)    DVT 2005  . Diabetes type 2, controlled (HCC)    pt denies- no meds- Pre DM-   . History of DVT (deep vein thrombosis) 2005   Pt reports blood clot below knee ?unsure which leg.  [name] Hyperlipidemia   . Nasal polyps   . Osteopenia   . Unspecified essential hypertension     Family History  Problem Relation Age of Onset  . Cancer Mother 51       ?carcinoid tumor (had surgical removal)  . Diabetes Mother        borderline  . Stomach cancer Mother   . Liver cancer Mother   . Heart disease Father        died 27, from MI, had hx of ETOH abuse and liver disease  . Thyroid cancer Sister        (diagnosed at 81) half sister (same dad)   . Diabetes type II Brother   . Kidney disease Neg Hx   . Hypertension Neg Hx   . Hyperlipidemia Neg Hx   . Colon cancer Neg Hx   . Esophageal cancer Neg Hx   . Rectal cancer Neg Hx   . Colon polyps Neg Hx     Past Surgical History:  Procedure Laterality Date  . COLONOSCOPY    . LASIK Bilateral 2008  . NASAL SINUS SURGERY  2008   Dr Lazarus Salines  . OVARIAN CYST REMOVAL  02/2016   pt reported-- Dr [name]  . POLYPECTOMY    . UTERINE FIBROID SURGERY  2005   Social History   Occupational History  . Occupation: Engineer, [employer]  Tobacco Use  . Smoking status: Never    Passive exposure: Past  . Smokeless tobacco: Never  Vaping Use  . Vaping status: Never Used   Substance and Sexual Activity  . Alcohol use: Yes    Alcohol/week: 0.0 standard drinks of alcohol    Comment: 1 glass of wine per year  . Drug use: No  . Sexual activity: Yes    Birth control/protection: None

## 2023-08-31 ENCOUNTER — Encounter (HOSPITAL_BASED_OUTPATIENT_CLINIC_OR_DEPARTMENT_OTHER): Payer: Self-pay | Admitting: Obstetrics and Gynecology

## 2023-08-31 DIAGNOSIS — Z01818 Encounter for other preprocedural examination: Secondary | ICD-10-CM | POA: Diagnosis not present

## 2023-08-31 NOTE — Progress Notes (Signed)
Your procedure is scheduled on Wednesday, 09/14/23.  Report to [facility] AT 5:30 AM.   Call this number if you have problems the morning of surgery  [phone].   OUR ADDRESS IS [address].  WE ARE LOCATED IN [facility].  PLEASE BRING YOUR INSURANCE CARD AND PHOTO ID DAY OF SURGERY.  ONLY 2 PEOPLE ARE ALLOWED IN  WAITING  ROOM                                      REMEMBER:  DO NOT EAT FOOD, CANDY GUM OR MINTS  AFTER MIDNIGHT THE NIGHT BEFORE YOUR SURGERY . YOU MAY HAVE CLEAR LIQUIDS FROM MIDNIGHT THE NIGHT BEFORE YOUR SURGERY UNTIL  4:30 AM. NO CLEAR LIQUIDS AFTER   4:30 AM DAY OF SURGERY.  YOU MAY  BRUSH YOUR TEETH MORNING OF SURGERY AND RINSE YOUR MOUTH OUT, NO CHEWING GUM CANDY OR MINTS.     CLEAR LIQUID DIET    Allowed      Water                                                                   Coffee and tea, regular and decaf  (NO cream or milk products of any type, may sweeten)                         Carbonated beverages, regular and diet                                    Sports drinks like Gatorade _____________________________________________________________________     TAKE ONLY THESE MEDICATIONS MORNING OF SURGERY: Albuterol inhaler and nebulizer, Zyrtec, Cymbalta, Flonase, Trelegy ellipta inhaler                                        DO NOT WEAR JEWERLY/  METAL/  PIERCINGS (INCLUDING NO PLASTIC PIERCINGS) DO NOT WEAR LOTIONS, POWDERS, PERFUMES OR NAIL POLISH ON YOUR FINGERNAILS. TOENAIL POLISH IS OK TO WEAR. DO NOT SHAVE FOR 48 HOURS PRIOR TO DAY OF SURGERY.  CONTACTS, GLASSES, OR DENTURES MAY NOT BE WORN TO SURGERY.  REMEMBER: NO SMOKING, VAPING ,  DRUGS OR ALCOHOL FOR 24 HOURS BEFORE YOUR SURGERY.                                    [facility] IS NOT RESPONSIBLE  FOR ANY BELONGINGS.                                                                    [name]  [facility] - Preparing for  Surgery Before surgery, you can play an important role.  Because skin is not sterile, your skin needs to be as free of germs as possible.  You can reduce the number of germs on your skin by washing with CHG (chlorahexidine gluconate) soap before surgery.  CHG is an antiseptic cleaner which kills germs and bonds with the skin to continue killing germs even after washing. Please DO NOT use if you have an allergy to CHG or antibacterial soaps.  If your skin becomes reddened/irritated stop using the CHG and inform your nurse when you arrive at Short Stay. Do not shave (including legs and underarms) for at least 48 hours prior to the first CHG shower.  You may shave your face/neck. Please follow these instructions carefully:  1.  Shower with CHG Soap the night before surgery and the  morning of Surgery.  2.  If you choose to wash your hair, wash your hair first as usual with your  normal  shampoo.  3.  After you shampoo, rinse your hair and body thoroughly to remove the  shampoo.                                        4.  Use CHG as you would any other liquid soap.  You can apply chg directly  to the skin and wash , chg soap provided, night before and morning of your surgery.  5.  Apply the CHG Soap to your body ONLY FROM THE NECK DOWN.   Do not use on face/ open                           Wound or open sores. Avoid contact with eyes, ears mouth and genitals (private parts).                       Wash face,  Genitals (private parts) with your normal soap.             6.  Wash thoroughly, paying special attention to the area where your surgery  will be performed.  7.  Thoroughly rinse your body with warm water from the neck down.  8.  DO NOT shower/wash with your normal soap after using and rinsing off  the CHG Soap.             9.  Pat yourself dry with a clean towel.            10.  Wear clean pajamas.            11.  Place clean sheets on your bed the night of your first shower and do not  sleep with  pets. Day of Surgery : Do not apply any lotions/ powders the morning of surgery.  Please wear clean clothes to the hospital/surgery center.  IF YOU HAVE ANY SKIN IRRITATION OR PROBLEMS WITH THE SURGICAL SOAP, PLEASE GET A BAR OF GOLD DIAL SOAP AND SHOWER THE NIGHT BEFORE YOUR SURGERY AND THE MORNING OF YOUR SURGERY. PLEASE LET THE NURSE KNOW MORNING OF YOUR SURGERY IF YOU HAD ANY PROBLEMS WITH THE SURGICAL SOAP.   YOUR SURGEON MAY HAVE REQUESTED EXTENDED RECOVERY TIME AFTER YOUR SURGERY. IT COULD BE A  JUST A FEW HOURS  UP TO AN OVERNIGHT STAY.  YOUR SURGEON SHOULD HAVE  DISCUSSED THIS WITH YOU PRIOR TO YOUR SURGERY. IN THE EVENT YOU NEED TO STAY OVERNIGHT PLEASE REFER TO THE FOLLOWING GUIDELINES. YOU MAY HAVE UP TO 4 VISITORS  MAY VISIT IN THE EXTENDED RECOVERY ROOM UNTIL 800 PM ONLY.  ONE  VISITOR AGE 21 AND OVER MAY SPEND THE NIGHT AND MUST BE IN EXTENDED RECOVERY ROOM NO LATER THAN 800 PM . YOUR DISCHARGE TIME AFTER YOU SPEND THE NIGHT IS 900 AM THE MORNING AFTER YOUR SURGERY. YOU MAY PACK A SMALL OVERNIGHT BAG WITH TOILETRIES FOR YOUR OVERNIGHT STAY IF YOU WISH.  REGARDLESS OF IF YOU STAY OVER NIGHT OR ARE DISCHARGED THE SAME DAY YOU WILL BE REQUIRED TO HAVE A RESPONSIBLE ADULT (18 YRS OLD OR OLDER) STAY WITH YOU FOR AT LEAST THE FIRST 24 HOURS  YOUR PRESCRIPTION MEDICATIONS WILL BE PROVIDED DURING YOUR HOSPITAL STAY.  ________________________________________________________________________                                                        QUESTIONS Mechele Claude PRE OP NURSE PHONE [phone].

## 2023-08-31 NOTE — Progress Notes (Addendum)
Spoke w/ via phone for pre-op interview---[name] needs dos---- none             [name] results------09/06/23 [name] appt for cbc, type & screen, EKG, 08/30/23 cmp, lipid panel and HgbA1c in Epic, 11/18/22 chest xray in Epic, 05/08/21 CT cardiac scoring in Epic, 05/25/21 Myocardial Perfusion COVID test -----patient states asymptomatic no test needed Arrive at -------0530 on Wednesday, 09/14/23 NPO after MN NO Solid Food.  Clear liquids from MN until---0430 Med rec completed Medications to take morning of surgery -----Albuterol inhaler and nebulizer prn, Zyrtec, Cymbalta, Flonase, Trelegy inhaler Diabetic medication -----n/a Patient instructed no nail polish to be worn day of surgery Patient instructed to bring photo id and insurance card day of surgery Patient aware to have Driver (ride ) / caregiver    for 24 hours after surgery - husband, [name] Patient Special Instructions -----Bring albuterol inhaler on the day of surgery. Extended / overnight stay instructions given. Pre-Op special Instructions -----none Patient verbalized understanding of instructions that were given at this phone interview. Patient denies shortness of breath, chest pain, fever, cough at this phone interview.  Patient has a hx of a DVT in 2005. Per patient, Dr. [name] is going to prescribe Lovenox injections after surgery.  Patient has a hx of severe asthma w/ COPD. She follows w. Dr. [name], pulmonologist, LOV 11/18/22 and her PCP, [name], NP, LOV 08/30/23. Per patient, her asthma has greatly improved on Dupixent/Trelegy. She states that she hasn't needed prednisone in several months. Per OV note from [name], NP, asthma symptoms have improved. Reviewed case with Dr. Stephannie Peters, MDA on 08/31/23. Per [facility], ok to proceed with surgery at [facility]

## 2023-09-01 ENCOUNTER — Encounter: Payer: Self-pay | Admitting: Family

## 2023-09-01 DIAGNOSIS — I10 Essential (primary) hypertension: Secondary | ICD-10-CM

## 2023-09-02 MED ORDER — LISINOPRIL 10 MG PO TABS: 15.0000 mg | ORAL_TABLET | Freq: Every day | ORAL | Status: DC

## 2023-09-06 ENCOUNTER — Encounter: Payer: Self-pay | Admitting: Family

## 2023-09-06 ENCOUNTER — Encounter: Payer: Self-pay | Admitting: Physician Assistant

## 2023-09-06 ENCOUNTER — Ambulatory Visit: Payer: BC Managed Care – PPO | Admitting: Physician Assistant

## 2023-09-06 ENCOUNTER — Encounter (HOSPITAL_COMMUNITY)
Admission: RE | Admit: 2023-09-06 | Discharge: 2023-09-06 | Disposition: A | Discharge status: Home / Self Care | Payer: BC Managed Care – PPO | Source: Ambulatory Visit | Attending: Obstetrics and Gynecology

## 2023-09-06 DIAGNOSIS — M1712 Unilateral primary osteoarthritis, left knee: Secondary | ICD-10-CM | POA: Diagnosis not present

## 2023-09-06 DIAGNOSIS — Z01818 Encounter for other preprocedural examination: Secondary | ICD-10-CM | POA: Diagnosis not present

## 2023-09-06 LAB — CBC
HCT: 36.2 % (ref 36.0–46.0)
Hemoglobin: 11.3 g/dL — ABNORMAL LOW (ref 12.0–15.0)
MCH: 25.3 pg — ABNORMAL LOW (ref 26.0–34.0)
MCHC: 31.2 g/dL (ref 30.0–36.0)
MCV: 81 fL (ref 80.0–100.0)
Platelets: 276 10*3/uL (ref 150–400)
RBC: 4.47 MIL/uL (ref 3.87–5.11)
RDW: 13.2 % (ref 11.5–15.5)
WBC: 5.8 10*3/uL (ref 4.0–10.5)
nRBC: 0 % (ref 0.0–0.2)

## 2023-09-06 MED ORDER — SODIUM HYALURONATE (VISCOSUP) 25 MG/2.5ML IX SOSY
25.0000 mg | PREFILLED_SYRINGE | INTRA_ARTICULAR | Status: AC | PRN
Start: 2023-09-06 — End: 2023-09-06
  Administered 2023-09-06: 25 mg via INTRA_ARTICULAR

## 2023-09-06 NOTE — Progress Notes (Signed)
Office Visit Note   Patient: [name]           Date of Birth: [birth year]           MRN: [id number] Visit Date: 09/06/2023              Requested by: Sandford Craze, NP [address] [place],  [place] [address] PCP: Sandford Craze, NP  No chief complaint on file.     HPI: Patient comes in today for her second TriVisc injection into her left knee.  She has not had any relief yet.  Denies any new injuries tolerated the first injection well  Assessment & Plan: Visit Diagnoses:  1. Unilateral primary osteoarthritis, left knee     Plan: TriVisc injected without difficulty will follow-up in 1 week for final injection  Follow-Up Instructions: 1 week  Ortho Exam  Patient is alert, oriented, no adenopathy, well-dressed, normal affect, normal respiratory effort.   Imaging: No results found. No images are attached to the encounter.  Labs: Lab Results  Component Value Date   HGBA1C 6.6 (H) 08/30/2023   HGBA1C 6.1 05/03/2023   HGBA1C 6.4 12/31/2022   ESRSEDRATE 2 01/25/2023   CRP 1.6 01/25/2023     Lab Results  Component Value Date   ALBUMIN 3.7 08/30/2023   ALBUMIN 3.8 09/20/2022   ALBUMIN 3.7 01/21/2022    No results found for: "MG" No results found for: "VD25OH"  No results found for: "PREALBUMIN"    Latest Ref Rng & Units 09/06/2023    9:42 AM 02/21/2023   12:34 PM 04/07/2020    8:40 AM  CBC EXTENDED  WBC 4.0 - 10.5 K/uL 5.8  7.2  6.2   RBC 3.87 - 5.11 MIL/uL 4.47  4.95  5.14   Hemoglobin 12.0 - 15.0 g/dL 27.2  53.6  64.4   HCT 36.0 - 46.0 % 36.2  38.7  41.2   Platelets 150 - 400 K/uL 276  367.0  270.0   NEUT# 1.4 - 7.7 K/uL  3.7  3.7   Lymph# 0.7 - 4.0 K/uL  2.5  1.9      There is no height or weight on file to calculate BMI.  Orders:  No orders of the defined types were placed in this encounter.  No orders of the defined types were placed in this encounter.    Procedures: Large Joint Inj: L knee on 09/06/2023  11:25 AM Indications: pain and diagnostic evaluation Details: 22 G 1.5 in needle, anteromedial approach  Arthrogram: No  Medications: 25 mg Sodium Hyaluronate (Viscosup) 25 MG/2.5ML Outcome: tolerated well, no immediate complications Procedure, treatment alternatives, risks and benefits explained, specific risks discussed. Consent was given by the patient.    Clinical Data: No additional findings.  ROS:  All other systems negative, except as noted in the HPI. Review of Systems  Objective: Vital Signs: LMP 06/22/2018   Specialty Comments:  No specialty comments available.  PMFS History: Patient Active Problem List   Diagnosis Date Noted  . Pain in left knee 08/30/2023  . Thyromegaly 05/07/2023  . Effusion, left knee 05/03/2023  . Polyarthralgia 12/27/2022  . Osteoarthritis 09/20/2022  . Osteopenia 07/01/2022  . Hair loss 05/14/2022  . Plantar fasciitis 05/03/2022  . 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 Grief reaction 11/16/2021  . Positive ANA (antinuclear antibody) 10/15/2021  . Myalgia 10/14/2021  . Chronic pansinusitis 08/26/2021  . History of sinus surgery 07/31/2021  . Nasal polyps   . Hyperlipidemia   .  Clotting disorder (HCC)   . Allergy   . Severe persistent asthma 03/24/2021  . Steroid dependence (HCC) 04/01/2020  . Diabetes type 2, controlled (HCC)   . Chronic venous insufficiency 09/16/2016  . 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  . Uterine leiomyoma 05/25/2016  . 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 Rectal bleeding 10/20/2015  . Obesity 04/27/2015  . Abnormal EKG 03/19/2015  . Preventative health care 02/17/2015  . OCD (obsessive compulsive disorder) 01/14/2015  . Essential hypertension 01/13/2015  . Peripheral edema 06/17/2012  . NASAL POLYP 01/01/2008  . Seasonal and perennial allergic rhinitis 01/01/2008  . Moderate persistent asthma 01/01/2008  . DEEP VENOUS THROMBOPHLEBITIS, HX OF 01/01/2008  .  History of DVT (deep vein thrombosis) 2005   Past Medical History:  Diagnosis Date  . Allergic rhinitis, cause unspecified   . Allergy   . Arthritis    left knee  . Asthma    hx of severe asthma w/ copd, follows w/ Dr. [name], pulmonologist and PCP (Per 08/30/23 note from [name], NP, asthma is much improved on Dupixent and Trellegy.) As of 08/30/23, per patient, last asthma exacerbation in early 05/2023 when patient went to [place]. Patient states her asthma is greatly improved on Dupixent injections and that she rarely uses prednisone.  . Clotting disorder (HCC)    DVT 2005  . Complication of anesthesia   . Diabetes type 2, controlled (HCC)    no meds, controlled with diet and exercise  . History of DVT (deep vein thrombosis) 2005   Pt reports blood clot below knee ?unsure which leg.  [name] Hyperlipidemia   . Nasal polyps   . Osteopenia   . PONV (postoperative nausea and vomiting)   . Unspecified essential hypertension    Follows w/ PCP.  [name] Wears glasses     Family History  Problem Relation Age of Onset  . Cancer Mother 48       ?carcinoid tumor (had surgical removal)  . Diabetes Mother        borderline  . Stomach cancer Mother   . Liver cancer Mother   . Heart disease Father        died 26, from MI, had hx of ETOH abuse and liver disease  . Thyroid cancer Sister        (diagnosed at 65) half sister (same dad)   . Diabetes type II Brother   . Kidney disease Neg Hx   . Hypertension Neg Hx   . Hyperlipidemia Neg Hx   . Colon cancer Neg Hx   . Esophageal cancer Neg Hx   . Rectal cancer Neg Hx   . Colon polyps Neg Hx     Past Surgical History:  Procedure Laterality Date  . COLONOSCOPY     02/05/21 & 12/12/18hx of colon polyps  . DILATION AND CURETTAGE OF UTERUS  2017   removed fibroids  . LASIK Bilateral 2008  . NASAL SINUS SURGERY  2008   Dr Lazarus Salines  . OVARIAN CYST REMOVAL  02/2016   pt reported-- Dr [name]  . POLYPECTOMY    . UTERINE FIBROID SURGERY   2005   Social History   Occupational History  . Occupation: Engineer, water [employer]  Tobacco Use  . Smoking status: Never    Passive exposure: Past  . Smokeless tobacco: Never  Vaping Use  . Vaping status: Never Used  Substance and Sexual Activity  . Alcohol use: Not Currently  . Drug use: No  .  Sexual activity: Yes    Birth control/protection: None, Post-menopausal

## 2023-09-07 ENCOUNTER — Other Ambulatory Visit: Payer: Self-pay | Admitting: Internal Medicine

## 2023-09-07 DIAGNOSIS — J339 Nasal polyp, unspecified: Secondary | ICD-10-CM

## 2023-09-07 DIAGNOSIS — J4550 Severe persistent asthma, uncomplicated: Secondary | ICD-10-CM

## 2023-09-09 NOTE — Telephone Encounter (Signed)
Pt calling in regards to getting her Dupixent refill  Pharmacy: [facility]

## 2023-09-09 NOTE — Telephone Encounter (Signed)
Appt on 11/22/23  Refill for Dupixent sent for 3 months. Further refills to be sent after OV

## 2023-09-13 ENCOUNTER — Encounter: Payer: Self-pay | Admitting: Physician Assistant

## 2023-09-13 ENCOUNTER — Ambulatory Visit: Payer: BC Managed Care – PPO | Admitting: Physician Assistant

## 2023-09-13 DIAGNOSIS — M1712 Unilateral primary osteoarthritis, left knee: Secondary | ICD-10-CM | POA: Diagnosis not present

## 2023-09-13 MED ORDER — SODIUM HYALURONATE (VISCOSUP) 25 MG/2.5ML IX SOSY
25.0000 mg | PREFILLED_SYRINGE | INTRA_ARTICULAR | Status: AC | PRN
Start: 2023-09-13 — End: 2023-09-13
  Administered 2023-09-13: 25 mg via INTRA_ARTICULAR

## 2023-09-13 NOTE — Anesthesia Preprocedure Evaluation (Signed)
Anesthesia Evaluation  Patient identified by MRN, date of birth, ID band Patient awake    Reviewed: Allergy & Precautions, NPO status , Patient's Chart, lab work & pertinent test results  History of Anesthesia Complications (+) PONV and history of anesthetic complications  Airway Mallampati: III  TM Distance: >3 FB Neck ROM: Full    Dental  (+) Dental Advisory Given   Pulmonary asthma (well-controlled) , COPD   Pulmonary exam normal breath sounds clear to auscultation       Cardiovascular hypertension (lisinopril), Pt. on medications (-) angina + DVT (2005)  (-) Past MI, (-) Cardiac Stents and (-) CABG (-) dysrhythmias  Rhythm:Regular Rate:Normal  HLD  Normal stress test 05/25/2021   Neuro/Psych  PSYCHIATRIC DISORDERS (OCD)      negative neurological ROS     GI/Hepatic negative GI ROS, Neg liver ROS,,,  Endo/Other  negative endocrine ROS  Reports that she doesn't have diabetes. Her blood sugars were elevated when she was on prednisone but have been fine since she has been off of prednisone.  Renal/GU negative Renal ROS     Musculoskeletal  (+) Arthritis ,  osteopenia   Abdominal   Peds  Hematology negative hematology ROS (+) Lab Results      Component                Value               Date                      WBC                      5.8                 09/06/2023                HGB                      11.3 (L)            09/06/2023                HCT                      36.2                09/06/2023                MCV                      81.0                09/06/2023                PLT                      276                 09/06/2023              Anesthesia Other Findings Has intermittently taken prednisone 10 mg for asthma, but has not needed any in at least the last 4 months as her asthma is now well-controlled with Dupixent.  Reproductive/Obstetrics fibroids  Anesthesia Physical Anesthesia Plan  ASA: 2  Anesthesia Plan: General   Post-op Pain Management: Tylenol PO (pre-op)*   Induction: Intravenous  PONV Risk Score and Plan: 4 or greater and Ondansetron, Dexamethasone, Midazolam, Scopolamine patch - Pre-op and Treatment may vary due to age or medical condition  Airway Management Planned: Oral ETT  Additional Equipment:   Intra-op Plan:   Post-operative Plan: Extubation in OR  Informed Consent: I have reviewed the patients History and Physical, chart, labs and discussed the procedure including the risks, benefits and alternatives for the proposed anesthesia with the patient or authorized representative who has indicated his/her understanding and acceptance.     Dental advisory given  Plan Discussed with: CRNA and Anesthesiologist  Anesthesia Plan Comments: (Risks of general anesthesia discussed including, but not limited to, sore throat, hoarse voice, chipped/damaged teeth, injury to vocal cords, nausea and vomiting, allergic reactions, lung infection, heart attack, stroke, and death. All questions answered. )        Anesthesia Quick Evaluation

## 2023-09-13 NOTE — Progress Notes (Signed)
Office Visit Note   Patient: [name]           Date of Birth: 08-21-1966           MRN: [id number] Visit Date: 09/13/2023              Requested by: Sandford Craze, NP [address] [place],  [place] [address] PCP: Sandford Craze, NP  Left knee pain    HPI: Patient comes in today for her third and final viscosupplementation.  She tolerated previous injections well and now thinks she is getting some relief.  She complains of some chronic left lower extremity swelling but denies any calf pain fever or chills.  This has been going on and off for a while  Assessment & Plan: Visit Diagnoses: Osteoarthritis left knee  Plan: Micah Flesher forward with injection today did not have any evidence of cellulitis or DVT.  This is chronic lower extremity swelling she does have some prominent veins certainly could be secondary to this.  She had a soft and supple calf without any tenderness in the negative Denna Haggard' sign suggested trying a lower extremity compression stocking.  Follow-Up Instructions: Return if symptoms worsen or fail to improve.   Ortho Exam  Patient is alert, oriented, no adenopathy, well-dressed, normal affect, normal respiratory effort. Left knee no erythema no effusion compartments are soft and compressible negative Denna Haggard' sign she is neurovascularly intact  Imaging: No results found. No images are attached to the encounter.  Labs: Lab Results  Component Value Date   HGBA1C 6.6 (H) 08/30/2023   HGBA1C 6.1 05/03/2023   HGBA1C 6.4 12/31/2022   ESRSEDRATE 2 01/25/2023   CRP 1.6 01/25/2023     Lab Results  Component Value Date   ALBUMIN 3.7 08/30/2023   ALBUMIN 3.8 09/20/2022   ALBUMIN 3.7 01/21/2022    No results found for: "MG" No results found for: "VD25OH"  No results found for: "PREALBUMIN"    Latest Ref Rng & Units 09/06/2023    9:42 AM 02/21/2023   12:34 PM 04/07/2020    8:40 AM  CBC EXTENDED  WBC 4.0 - 10.5 K/uL 5.8  7.2  6.2    RBC 3.87 - 5.11 MIL/uL 4.47  4.95  5.14   Hemoglobin 12.0 - 15.0 g/dL 84.1  32.4  40.1   HCT 36.0 - 46.0 % 36.2  38.7  41.2   Platelets 150 - 400 K/uL 276  367.0  270.0   NEUT# 1.4 - 7.7 K/uL  3.7  3.7   Lymph# 0.7 - 4.0 K/uL  2.5  1.9      There is no height or weight on file to calculate BMI.  Orders:  No orders of the defined types were placed in this encounter.  No orders of the defined types were placed in this encounter.    Procedures: Large Joint Inj on 09/13/2023 11:20 AM Indications: pain and diagnostic evaluation Details: 1.5 in anteromedial approach  Arthrogram: No  Medications: 25 mg Sodium Hyaluronate (Viscosup) 25 MG/2.5ML Outcome: tolerated well, no immediate complications Procedure, treatment alternatives, risks and benefits explained, specific risks discussed. Consent was given by the patient. Immediately prior to procedure a time out was called to verify the correct patient, procedure, equipment, support staff and site/side marked as required. Patient was prepped and draped in the usual sterile fashion.    Clinical Data: No additional findings.  ROS:  All other systems negative, except as noted in the HPI. Review of Systems  Objective: Vital Signs: LMP 06/22/2018   Specialty Comments:  No specialty comments available.  PMFS History: Patient Active Problem List   Diagnosis Date Noted  . Pain in left knee 08/30/2023  . Thyromegaly 05/07/2023  . Effusion, left knee 05/03/2023  . Polyarthralgia 12/27/2022  . Osteoarthritis 09/20/2022  . Osteopenia 07/01/2022  . Hair loss 05/14/2022  . Plantar fasciitis 05/03/2022  . 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 Grief reaction 11/16/2021  . Positive ANA (antinuclear antibody) 10/15/2021  . Myalgia 10/14/2021  . Chronic pansinusitis 08/26/2021  . History of sinus surgery 07/31/2021  . Nasal polyps   . Hyperlipidemia   . Clotting disorder (HCC)   . Allergy   . Severe persistent asthma 03/24/2021   . Steroid dependence (HCC) 04/01/2020  . Diabetes type 2, controlled (HCC)   . Chronic venous insufficiency 09/16/2016  . 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  . Uterine leiomyoma 05/25/2016  . 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 Rectal bleeding 10/20/2015  . Obesity 04/27/2015  . Abnormal EKG 03/19/2015  . Preventative health care 02/17/2015  . OCD (obsessive compulsive disorder) 01/14/2015  . Essential hypertension 01/13/2015  . Peripheral edema 06/17/2012  . NASAL POLYP 01/01/2008  . Seasonal and perennial allergic rhinitis 01/01/2008  . Moderate persistent asthma 01/01/2008  . DEEP VENOUS THROMBOPHLEBITIS, HX OF 01/01/2008  . History of DVT (deep vein thrombosis) 2005   Past Medical History:  Diagnosis Date  . Allergic rhinitis, cause unspecified   . Allergy   . Arthritis    left knee  . Asthma    hx of severe asthma w/ copd, follows w/ Dr. [name], pulmonologist and PCP (Per 08/30/23 note from [name], NP, asthma is much improved on Dupixent and Trellegy.) As of 08/30/23, per patient, last asthma exacerbation in early 05/2023 when patient went to [place]. Patient states her asthma is greatly improved on Dupixent injections and that she rarely uses prednisone.  . Clotting disorder (HCC)    DVT 2005  . Complication of anesthesia   . Diabetes type 2, controlled (HCC)    no meds, controlled with diet and exercise  . History of DVT (deep vein thrombosis) 2005   Pt reports blood clot below knee ?unsure which leg.  [name] Hyperlipidemia   . Nasal polyps   . Osteopenia   . PONV (postoperative nausea and vomiting)   . Unspecified essential hypertension    Follows w/ PCP.  [name] Wears glasses     Family History  Problem Relation Age of Onset  . Cancer Mother 51       ?carcinoid tumor (had surgical removal)  . Diabetes Mother        borderline  . Stomach cancer Mother   . Liver cancer Mother   . Heart disease Father        died  35, from MI, had hx of ETOH abuse and liver disease  . Thyroid cancer Sister        (diagnosed at 2) half sister (same dad)   . Diabetes type II Brother   . Kidney disease Neg Hx   . Hypertension Neg Hx   . Hyperlipidemia Neg Hx   . Colon cancer Neg Hx   . Esophageal cancer Neg Hx   . Rectal cancer Neg Hx   . Colon polyps Neg Hx     Past Surgical History:  Procedure Laterality Date  . COLONOSCOPY     02/05/21 & 12/12/18hx of colon polyps  . DILATION  AND CURETTAGE OF UTERUS  2017   removed fibroids  . LASIK Bilateral 2008  . NASAL SINUS SURGERY  2008   Dr Lazarus Salines  . OVARIAN CYST REMOVAL  02/2016   pt reported-- Dr [name]  . POLYPECTOMY    . UTERINE FIBROID SURGERY  2005   Social History   Occupational History  . Occupation: Engineer, [employer]  Tobacco Use  . Smoking status: Never    Passive exposure: Past  . Smokeless tobacco: Never  Vaping Use  . Vaping status: Never Used  Substance and Sexual Activity  . Alcohol use: Not Currently  . Drug use: No  . Sexual activity: Yes    Birth control/protection: None, Post-menopausal

## 2023-09-13 NOTE — H&P (Signed)
57 y.o. with symptomatic fibroid uterus.  Previously: "GYN [place] to f/u on fibroids; Pt. c/o intermittent LLQ cramping, started 1 month ago; c/o brown d/c, denies any odor, itching or irritation/tb//  PMP d/c with LLQ pain. Hx of fibroids.  [place] 11-2021 was Physician interpretation: 6x6x6, EM 4.35mm. Multiple fibroids all less than/= 2 cm. Echogen area in LUS 1 cm. Ovaries not seen./mah.  Today Physician interpretation: 6x5x5, EM not seen secondary fibroids. Multiple fibroids, largest 1-2 cm. Ovaries not seen. [place]  EM not seen this time. EMB last done 2016. Will do today since EM not seen and d/w pt options.   Not on HRT, hx of factor V and DVT. Had myomectomy in 2005. Hysteroscopy in 2016. "  "Pt. here for recheck; had [place] and EMB last visit; considering hysterectomy; still having some brown d/c/tb//  EMB benign. Symptomatic fibroid uterus. Still dealing with pain from fibroids. Still some brownish d/c."  Pap normal.  With pt in trendelenberg and with SCDs, will start lovenox post op.   Past Medical History:  Diagnosis Date   Allergic rhinitis, cause unspecified    Allergy    Arthritis    left knee   Asthma    hx of severe asthma w/ copd, follows w/ Dr. [name], pulmonologist and PCP (Per 08/30/23 note from [name], NP, asthma is much improved on Dupixent and Trellegy.) As of 08/30/23, per patient, last asthma exacerbation in early 05/2023 when patient went to [place]. Patient states her asthma is greatly improved on Dupixent injections and that she rarely uses prednisone.   Clotting disorder (HCC)    DVT 2005   Complication of anesthesia    Diabetes type 2, controlled (HCC)    no meds, controlled with diet and exercise   History of DVT (deep vein thrombosis) 2005   Pt reports blood clot below knee ?unsure which leg.   Hyperlipidemia    Nasal polyps    Osteopenia    PONV (postoperative nausea and vomiting)    Unspecified essential hypertension    Follows w/ PCP.    Wears glasses    Past Surgical History:  Procedure Laterality Date   COLONOSCOPY     02/05/21 & 12/12/18hx of colon polyps   DILATION AND CURETTAGE OF UTERUS  2017   removed fibroids   LASIK Bilateral 2008   NASAL SINUS SURGERY  2008   Dr Lazarus Salines   OVARIAN CYST REMOVAL  02/2016   pt reported-- Dr [name]   POLYPECTOMY     UTERINE FIBROID SURGERY  2005    Social History   Socioeconomic History   Marital status: Married    Spouse name: Not on file   Number of children: Not on file   Years of education: Not on file   Highest education level: Not on file  Occupational History   Occupation: Production designer, theatre/television/film [employer]  Tobacco Use   Smoking status: Never    Passive exposure: Past   Smokeless tobacco: Never  Vaping Use   Vaping status: Never Used  Substance and Sexual Activity   Alcohol use: Not Currently   Drug use: No   Sexual activity: Yes    Birth control/protection: None, Post-menopausal  Other Topics Concern   Not on file  Social History Narrative   Married   No children   No pets   Works at [employer] as a Production designer, theatre/television/film    Enjoys movies, shopping, 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  Social Determinants of Health   Financial Resource Strain: Not on file  Food Insecurity: Not on file  Transportation Needs: Not on file  Physical Activity: Not on file  Stress: Not on file  Social Connections: Not on file  Intimate Partner Violence: Not on file    No current facility-administered medications on file prior to encounter.   Current Outpatient Medications on File Prior to Encounter  Medication Sig Dispense Refill   Cyanocobalamin (B-12) 2500 MCG SUBL Place under the tongue.     Multiple Vitamin (MULTIVITAMIN WITH MINERALS) TABS tablet Take 1 tablet by mouth daily. For 50 over.     albuterol (PROVENTIL) (2.5 MG/3ML) 0.083% nebulizer solution Take 2.5 mg by nebulization every 4 (four) hours as needed for wheezing or shortness of breath.      Calcium Carbonate-Vit D-Min (CALTRATE 600+D PLUS MINERALS) 600-800 MG-UNIT CHEW Chew 1 tablet by mouth in the morning and at bedtime. 60 tablet    cetirizine (ZYRTEC) 10 MG tablet Take 10 mg by mouth daily.     fluticasone (FLONASE) 50 MCG/ACT nasal spray 2 sprays each nostril daily 16 g 12   Fluticasone-Umeclidin-Vilant (TRELEGY ELLIPTA) 100-62.5-25 MCG/ACT AEPB Inhale 1 puff into the lungs daily. (Patient taking differently: Inhale 1 puff into the lungs daily as needed.) 60 each 11   hydroxychloroquine (PLAQUENIL) 200 MG tablet Take 200 mg by mouth 2 (two) times daily. (Patient not taking: Reported on 08/30/2023)      Allergies  Allergen Reactions   Aspirin Other (See Comments)    wheezing   Latex Hives   Shellfish Allergy Swelling   Shellfish-Derived Products Other (See Comments)    Anaphylactic reaction   Penicillins Rash    Vitals:   08/31/23 0952  Weight: 88.9 kg    Lungs: clear to ascultation Cor:  RRR Abdomen:  soft, nontender, nondistended. Ex:  no cords, erythema Pelvic:   Vulva: no masses, no atrophy, no lesions Vagina: no tenderness, no erythema, no abnormal vaginal discharge, no vesicle(s) or ulcers, no rectocele, cystocele (-1 to 0) Cervix: grossly normal, no discharge, no cervical motion tenderness Uterus: normal size (9-10- smaller than last year- see [place]), normal shape, midline, no uterine prolapse, non-tender Bladder/Urethra: no urethral discharge, no urethral mass, bladder non distended, Urethra hypermobile Adnexa/Parametria: no parametrial tenderness, no parametrial mass, no adnexal tenderness, no ovarian mass   A:  For Robo TLH/salpingectomies and cysto.  Lovenox post op for Factor V and hx of DVT.  Ted hose on post op.   P: P: All risks, benefits and alternatives d/w patient and she desires to proceed.  Patient has undergone an ERAS protocol and will receive preop antibiotics and SCDs during the operation.   Pt to have extended recovery but will go home same  day if eating, ambulating, voiding and pain control is good.  Loney Laurence

## 2023-09-14 ENCOUNTER — Ambulatory Visit (HOSPITAL_BASED_OUTPATIENT_CLINIC_OR_DEPARTMENT_OTHER): Payer: BC Managed Care – PPO | Admitting: Anesthesiology

## 2023-09-14 ENCOUNTER — Encounter (HOSPITAL_BASED_OUTPATIENT_CLINIC_OR_DEPARTMENT_OTHER)
Admission: RE | Disposition: A | Discharge status: Home / Self Care | Payer: Self-pay | Source: Ambulatory Visit | Attending: Obstetrics and Gynecology

## 2023-09-14 ENCOUNTER — Other Ambulatory Visit: Payer: Self-pay

## 2023-09-14 ENCOUNTER — Encounter (HOSPITAL_BASED_OUTPATIENT_CLINIC_OR_DEPARTMENT_OTHER): Payer: Self-pay | Admitting: Obstetrics and Gynecology

## 2023-09-14 ENCOUNTER — Ambulatory Visit (HOSPITAL_BASED_OUTPATIENT_CLINIC_OR_DEPARTMENT_OTHER)
Admission: RE | Admit: 2023-09-14 | Discharge: 2023-09-14 | Disposition: A | Discharge status: Home / Self Care | Payer: BC Managed Care – PPO | Source: Ambulatory Visit | Attending: Obstetrics and Gynecology | Admitting: Obstetrics and Gynecology

## 2023-09-14 DIAGNOSIS — J45909 Unspecified asthma, uncomplicated: Secondary | ICD-10-CM | POA: Insufficient documentation

## 2023-09-14 DIAGNOSIS — Z86718 Personal history of other venous thrombosis and embolism: Secondary | ICD-10-CM | POA: Insufficient documentation

## 2023-09-14 DIAGNOSIS — D251 Intramural leiomyoma of uterus: Secondary | ICD-10-CM | POA: Diagnosis not present

## 2023-09-14 DIAGNOSIS — D6851 Activated protein C resistance: Secondary | ICD-10-CM | POA: Insufficient documentation

## 2023-09-14 DIAGNOSIS — Z9889 Other specified postprocedural states: Secondary | ICD-10-CM

## 2023-09-14 DIAGNOSIS — N72 Inflammatory disease of cervix uteri: Secondary | ICD-10-CM | POA: Insufficient documentation

## 2023-09-14 DIAGNOSIS — J449 Chronic obstructive pulmonary disease, unspecified: Secondary | ICD-10-CM | POA: Diagnosis not present

## 2023-09-14 DIAGNOSIS — I10 Essential (primary) hypertension: Secondary | ICD-10-CM | POA: Diagnosis not present

## 2023-09-14 DIAGNOSIS — E785 Hyperlipidemia, unspecified: Secondary | ICD-10-CM | POA: Diagnosis not present

## 2023-09-14 DIAGNOSIS — D259 Leiomyoma of uterus, unspecified: Secondary | ICD-10-CM | POA: Diagnosis not present

## 2023-09-14 DIAGNOSIS — Z01818 Encounter for other preprocedural examination: Secondary | ICD-10-CM

## 2023-09-14 DIAGNOSIS — N8003 Adenomyosis of the uterus: Secondary | ICD-10-CM | POA: Diagnosis not present

## 2023-09-14 DIAGNOSIS — Z79899 Other long term (current) drug therapy: Secondary | ICD-10-CM | POA: Diagnosis not present

## 2023-09-14 HISTORY — DX: Other complications of anesthesia, initial encounter: T88.59XA

## 2023-09-14 HISTORY — DX: Nausea with vomiting, unspecified: R11.2

## 2023-09-14 HISTORY — DX: Other specified postprocedural states: Z98.890

## 2023-09-14 HISTORY — PX: CYSTOSCOPY: SHX5120

## 2023-09-14 HISTORY — DX: Presence of spectacles and contact lenses: Z97.3

## 2023-09-14 HISTORY — PX: ROBOTIC ASSISTED TOTAL HYSTERECTOMY WITH BILATERAL SALPINGO OOPHERECTOMY: SHX6086

## 2023-09-14 HISTORY — DX: Unspecified osteoarthritis, unspecified site: M19.90

## 2023-09-14 LAB — GLUCOSE, CAPILLARY
Glucose-Capillary: 111 mg/dL — ABNORMAL HIGH (ref 70–99)
Glucose-Capillary: 123 mg/dL — ABNORMAL HIGH (ref 70–99)

## 2023-09-14 LAB — ABO/RH: ABO/RH(D): O POS

## 2023-09-14 SURGERY — HYSTERECTOMY, TOTAL, ROBOT-ASSISTED, LAPAROSCOPIC, WITH BILATERAL SALPINGO-OOPHORECTOMY
Anesthesia: General

## 2023-09-14 MED ORDER — ENOXAPARIN SODIUM 40 MG/0.4ML IJ SOSY
40.0000 mg | PREFILLED_SYRINGE | INTRAMUSCULAR | Status: DC
  Administered 2023-09-14: 40 mg via SUBCUTANEOUS

## 2023-09-14 MED ORDER — MIDAZOLAM HCL 2 MG/2ML IJ SOLN
INTRAMUSCULAR | Status: AC
  Filled 2023-09-14: qty 2

## 2023-09-14 MED ORDER — ENOXAPARIN SODIUM 40 MG/0.4ML IJ SOSY
PREFILLED_SYRINGE | INTRAMUSCULAR | Status: AC
  Filled 2023-09-14: qty 0.4

## 2023-09-14 MED ORDER — ONDANSETRON HCL 4 MG/2ML IJ SOLN: 4.0000 mg | Freq: Four times a day (QID) | INTRAMUSCULAR | Status: DC | PRN

## 2023-09-14 MED ORDER — FENTANYL CITRATE (PF) 100 MCG/2ML IJ SOLN
INTRAMUSCULAR | Status: AC
  Filled 2023-09-14: qty 2

## 2023-09-14 MED ORDER — GABAPENTIN 300 MG PO CAPS
300.0000 mg | ORAL_CAPSULE | ORAL | Status: AC
  Administered 2023-09-14: 300 mg via ORAL

## 2023-09-14 MED ORDER — GABAPENTIN 300 MG PO CAPS
ORAL_CAPSULE | ORAL | Status: AC
  Filled 2023-09-14: qty 1

## 2023-09-14 MED ORDER — LACTATED RINGERS IV SOLN: INTRAVENOUS | Status: DC

## 2023-09-14 MED ORDER — FENTANYL CITRATE (PF) 100 MCG/2ML IJ SOLN
INTRAMUSCULAR | Status: DC | PRN
  Administered 2023-09-14: 50 ug via INTRAVENOUS

## 2023-09-14 MED ORDER — DEXAMETHASONE SODIUM PHOSPHATE 10 MG/ML IJ SOLN
INTRAMUSCULAR | Status: AC
  Filled 2023-09-14: qty 1

## 2023-09-14 MED ORDER — OXYCODONE HCL 5 MG PO TABS: 5.0000 mg | ORAL_TABLET | Freq: Once | ORAL | Status: DC | PRN

## 2023-09-14 MED ORDER — PHENYLEPHRINE 80 MCG/ML (10ML) SYRINGE FOR IV PUSH (FOR BLOOD PRESSURE SUPPORT)
PREFILLED_SYRINGE | INTRAVENOUS | Status: DC | PRN
  Administered 2023-09-14: 80 ug via INTRAVENOUS
  Administered 2023-09-14 (×2): 160 ug via INTRAVENOUS
  Administered 2023-09-14: 80 ug via INTRAVENOUS

## 2023-09-14 MED ORDER — DULOXETINE HCL 30 MG PO CPEP
30.0000 mg | ORAL_CAPSULE | Freq: Every day | ORAL | Status: DC
  Administered 2023-09-14: 30 mg via ORAL
  Filled 2023-09-14: qty 1

## 2023-09-14 MED ORDER — SCOPOLAMINE 1 MG/3DAYS TD PT72
1.0000 | MEDICATED_PATCH | TRANSDERMAL | Status: DC
  Administered 2023-09-14: 1.5 mg via TRANSDERMAL

## 2023-09-14 MED ORDER — DOCUSATE SODIUM 100 MG PO CAPS
ORAL_CAPSULE | ORAL | Status: AC
  Filled 2023-09-14: qty 1

## 2023-09-14 MED ORDER — PROPOFOL 10 MG/ML IV BOLUS
INTRAVENOUS | Status: AC
  Filled 2023-09-14: qty 20

## 2023-09-14 MED ORDER — ROCURONIUM BROMIDE 10 MG/ML (PF) SYRINGE
PREFILLED_SYRINGE | INTRAVENOUS | Status: AC
  Filled 2023-09-14: qty 10

## 2023-09-14 MED ORDER — ALBUTEROL SULFATE HFA 108 (90 BASE) MCG/ACT IN AERS: 2.0000 | INHALATION_SPRAY | RESPIRATORY_TRACT | Status: DC | PRN

## 2023-09-14 MED ORDER — SOD CITRATE-CITRIC ACID 500-334 MG/5ML PO SOLN: 30.0000 mL | ORAL | Status: DC

## 2023-09-14 MED ORDER — CIPROFLOXACIN IN D5W 400 MG/200ML IV SOLN
400.0000 mg | INTRAVENOUS | Status: AC
  Administered 2023-09-14: 400 mg via INTRAVENOUS

## 2023-09-14 MED ORDER — LIDOCAINE HCL (PF) 2 % IJ SOLN
INTRAMUSCULAR | Status: AC
  Filled 2023-09-14: qty 5

## 2023-09-14 MED ORDER — MIDAZOLAM HCL 5 MG/5ML IJ SOLN
INTRAMUSCULAR | Status: DC | PRN
  Administered 2023-09-14: 2 mg via INTRAVENOUS

## 2023-09-14 MED ORDER — OXYCODONE HCL 5 MG PO TABS
ORAL_TABLET | ORAL | Status: AC
  Filled 2023-09-14: qty 1

## 2023-09-14 MED ORDER — LISINOPRIL 5 MG PO TABS
15.0000 mg | ORAL_TABLET | Freq: Every day | ORAL | Status: DC
  Administered 2023-09-14: 15 mg via ORAL
  Filled 2023-09-14: qty 1

## 2023-09-14 MED ORDER — METRONIDAZOLE 500 MG/100ML IV SOLN
INTRAVENOUS | Status: AC
  Filled 2023-09-14: qty 100

## 2023-09-14 MED ORDER — METRONIDAZOLE 500 MG/100ML IV SOLN
500.0000 mg | INTRAVENOUS | Status: AC
  Administered 2023-09-14: 500 mg via INTRAVENOUS

## 2023-09-14 MED ORDER — OXYCODONE HCL 5 MG PO TABS
5.0000 mg | ORAL_TABLET | ORAL | Status: DC | PRN
  Administered 2023-09-14 (×2): 5 mg via ORAL

## 2023-09-14 MED ORDER — FLUORESCEIN SODIUM 10 % IV SOLN
INTRAVENOUS | Status: DC | PRN
Start: 2023-09-14 — End: 2023-09-14
  Administered 2023-09-14: 75 mg via INTRAVENOUS

## 2023-09-14 MED ORDER — SODIUM CHLORIDE 0.9 % IR SOLN
Status: DC | PRN
  Administered 2023-09-14: 1000 mL

## 2023-09-14 MED ORDER — PROPOFOL 10 MG/ML IV BOLUS
INTRAVENOUS | Status: DC | PRN
  Administered 2023-09-14: 160 mg via INTRAVENOUS

## 2023-09-14 MED ORDER — POVIDONE-IODINE 10 % EX SWAB: 2.0000 | Freq: Once | CUTANEOUS | Status: DC

## 2023-09-14 MED ORDER — OXYCODONE HCL 5 MG/5ML PO SOLN: 5.0000 mg | Freq: Once | ORAL | Status: DC | PRN

## 2023-09-14 MED ORDER — ROCURONIUM BROMIDE 10 MG/ML (PF) SYRINGE
PREFILLED_SYRINGE | INTRAVENOUS | Status: DC | PRN
  Administered 2023-09-14: 60 mg via INTRAVENOUS
  Administered 2023-09-14: 10 mg via INTRAVENOUS

## 2023-09-14 MED ORDER — ONDANSETRON HCL 4 MG PO TABS: 4.0000 mg | ORAL_TABLET | Freq: Four times a day (QID) | ORAL | Status: DC | PRN

## 2023-09-14 MED ORDER — ALBUTEROL SULFATE (2.5 MG/3ML) 0.083% IN NEBU: 2.5000 mg | INHALATION_SOLUTION | RESPIRATORY_TRACT | Status: DC | PRN

## 2023-09-14 MED ORDER — ACETAMINOPHEN 500 MG PO TABS
ORAL_TABLET | ORAL | Status: AC
  Filled 2023-09-14: qty 2

## 2023-09-14 MED ORDER — MENTHOL 3 MG MT LOZG: 1.0000 | LOZENGE | OROMUCOSAL | Status: DC | PRN

## 2023-09-14 MED ORDER — KETAMINE HCL 50 MG/5ML IJ SOSY
PREFILLED_SYRINGE | INTRAMUSCULAR | Status: AC
  Filled 2023-09-14: qty 5

## 2023-09-14 MED ORDER — SIMETHICONE 80 MG PO CHEW
80.0000 mg | CHEWABLE_TABLET | Freq: Four times a day (QID) | ORAL | Status: DC | PRN
  Administered 2023-09-14: 80 mg via ORAL

## 2023-09-14 MED ORDER — FENTANYL CITRATE (PF) 100 MCG/2ML IJ SOLN: 25.0000 ug | INTRAMUSCULAR | Status: DC | PRN

## 2023-09-14 MED ORDER — ONDANSETRON HCL 4 MG/2ML IJ SOLN
INTRAMUSCULAR | Status: AC
  Filled 2023-09-14: qty 2

## 2023-09-14 MED ORDER — ACETAMINOPHEN 500 MG PO TABS
1000.0000 mg | ORAL_TABLET | ORAL | Status: AC
  Administered 2023-09-14: 1000 mg via ORAL

## 2023-09-14 MED ORDER — SUGAMMADEX SODIUM 200 MG/2ML IV SOLN
INTRAVENOUS | Status: DC | PRN
  Administered 2023-09-14: 200 mg via INTRAVENOUS

## 2023-09-14 MED ORDER — SODIUM CHLORIDE 0.9 % IV SOLN
INTRAVENOUS | Status: DC | PRN
  Administered 2023-09-14: 60 mL

## 2023-09-14 MED ORDER — KETAMINE HCL 10 MG/ML IJ SOLN
INTRAMUSCULAR | Status: DC | PRN
Start: 2023-09-14 — End: 2023-09-14
  Administered 2023-09-14 (×5): 10 mg via INTRAVENOUS

## 2023-09-14 MED ORDER — DEXAMETHASONE SODIUM PHOSPHATE 10 MG/ML IJ SOLN
INTRAMUSCULAR | Status: DC | PRN
  Administered 2023-09-14: 10 mg via INTRAVENOUS

## 2023-09-14 MED ORDER — DOCUSATE SODIUM 100 MG PO CAPS
100.0000 mg | ORAL_CAPSULE | Freq: Two times a day (BID) | ORAL | Status: DC
  Administered 2023-09-14: 100 mg via ORAL

## 2023-09-14 MED ORDER — GABAPENTIN 100 MG PO CAPS: 100.0000 mg | ORAL_CAPSULE | Freq: Two times a day (BID) | ORAL | Status: DC | PRN

## 2023-09-14 MED ORDER — ENOXAPARIN (LOVENOX) PATIENT EDUCATION KIT
PACK | Freq: Once | Status: DC
  Filled 2023-09-14: qty 1

## 2023-09-14 MED ORDER — ONDANSETRON HCL 4 MG/2ML IJ SOLN
INTRAMUSCULAR | Status: DC | PRN
  Administered 2023-09-14: 4 mg via INTRAVENOUS

## 2023-09-14 MED ORDER — LIDOCAINE 2% (20 MG/ML) 5 ML SYRINGE
INTRAMUSCULAR | Status: DC | PRN
  Administered 2023-09-14: 80 mg via INTRAVENOUS
  Administered 2023-09-14: 1.5 mg/kg/h via INTRAVENOUS

## 2023-09-14 MED ORDER — PROMETHAZINE HCL 25 MG/ML IJ SOLN: 6.2500 mg | INTRAMUSCULAR | Status: DC | PRN

## 2023-09-14 MED ORDER — SCOPOLAMINE 1 MG/3DAYS TD PT72
MEDICATED_PATCH | TRANSDERMAL | Status: AC
  Filled 2023-09-14: qty 1

## 2023-09-14 MED ORDER — CIPROFLOXACIN IN D5W 400 MG/200ML IV SOLN
INTRAVENOUS | Status: AC
  Filled 2023-09-14: qty 200

## 2023-09-14 MED ORDER — SIMETHICONE 80 MG PO CHEW
CHEWABLE_TABLET | ORAL | Status: AC
  Filled 2023-09-14: qty 1

## 2023-09-14 SURGICAL SUPPLY — 61 items
ADH SKN CLS APL DERMABOND .7 (GAUZE/BANDAGES/DRESSINGS) ×2
APL SRG 38 LTWT LNG FL B (MISCELLANEOUS)
APPLICATOR ARISTA FLEXITIP XL (MISCELLANEOUS) IMPLANT
BARRIER ADHS 3X4 INTERCEED (GAUZE/BANDAGES/DRESSINGS) IMPLANT
BRR ADH 4X3 ABS CNTRL BYND (GAUZE/BANDAGES/DRESSINGS)
COVER BACK TABLE 60X90IN (DRAPES) ×2 IMPLANT
COVER TIP SHEARS 8 DVNC (MISCELLANEOUS) ×2 IMPLANT
DEFOGGER SCOPE WARMER CLEARIFY (MISCELLANEOUS) ×2 IMPLANT
DERMABOND ADVANCED .7 DNX12 (GAUZE/BANDAGES/DRESSINGS) ×2 IMPLANT
DILATOR CANAL MILEX (MISCELLANEOUS) IMPLANT
DRAPE ARM DVNC X/XI (DISPOSABLE) ×8 IMPLANT
DRAPE COLUMN DVNC XI (DISPOSABLE) ×2 IMPLANT
DRAPE ROBOTICS STRL (DRAPES) ×2 IMPLANT
DRAPE SURG IRRIG POUCH 19X23 (DRAPES) ×2 IMPLANT
DRAPE UTILITY XL STRL (DRAPES) ×2 IMPLANT
DRIVER NDL MEGA SUTCUT DVNCXI (INSTRUMENTS) ×2 IMPLANT
DRIVER NDLE MEGA SUTCUT DVNCXI (INSTRUMENTS) ×2 IMPLANT
DURAPREP 26ML APPLICATOR (WOUND CARE) ×2 IMPLANT
ELECT REM PT RETURN 9FT ADLT (ELECTROSURGICAL) ×2
ELECTRODE REM PT RTRN 9FT ADLT (ELECTROSURGICAL) ×2 IMPLANT
FORCEPS BPLR FENES DVNC XI (FORCEP) ×2 IMPLANT
FORCEPS PROGRASP DVNC XI (FORCEP) IMPLANT
FORCEPS TENACULUM DVNC XI (FORCEP) IMPLANT
GAUZE 4X4 16PLY ~~LOC~~+RFID DBL (SPONGE) IMPLANT
GLOVE BIO SURGEON STRL SZ7 (GLOVE) ×6 IMPLANT
HEMOSTAT ARISTA ABSORB 3G PWDR (HEMOSTASIS) IMPLANT
HOLDER FOLEY CATH W/STRAP (MISCELLANEOUS) IMPLANT
IRRIG SUCT STRYKERFLOW 2 WTIP (MISCELLANEOUS) ×2
IRRIGATION SUCT STRKRFLW 2 WTP (MISCELLANEOUS) ×2 IMPLANT
KIT PINK PAD W/HEAD ARE REST (MISCELLANEOUS) ×2
KIT PINK PAD W/HEAD ARM REST (MISCELLANEOUS) ×2 IMPLANT
KIT TURNOVER CYSTO (KITS) ×2 IMPLANT
LEGGING LITHOTOMY PAIR STRL (DRAPES) ×2 IMPLANT
MANIPULATOR ADVINCU DEL 2.5 PL (MISCELLANEOUS) IMPLANT
MANIPULATOR ADVINCU DEL 3.0 PL (MISCELLANEOUS) IMPLANT
MANIPULATOR ADVINCU DEL 3.5 PL (MISCELLANEOUS) IMPLANT
MANIPULATOR ADVINCU DEL 4.0 PL (MISCELLANEOUS) IMPLANT
NDL INSUFFLATION 14GA 120MM (NEEDLE) ×2 IMPLANT
NEEDLE INSUFFLATION 14GA 120MM (NEEDLE) ×2 IMPLANT
OBTURATOR OPTICAL STND 8 DVNC (TROCAR) ×2
OBTURATOR OPTICALSTD 8 DVNC (TROCAR) ×2 IMPLANT
PACK ROBOT WH (CUSTOM PROCEDURE TRAY) ×2 IMPLANT
PACK ROBOTIC GOWN (GOWN DISPOSABLE) ×2 IMPLANT
PAD OB MATERNITY 4.3X12.25 (PERSONAL CARE ITEMS) ×2 IMPLANT
PAD PREP 24X48 CUFFED NSTRL (MISCELLANEOUS) ×2 IMPLANT
POUCH LAPAROSCOPIC INSTRUMENT (MISCELLANEOUS) IMPLANT
PROTECTOR NERVE ULNAR (MISCELLANEOUS) ×4 IMPLANT
SCISSORS MNPLR CVD DVNC XI (INSTRUMENTS) ×2 IMPLANT
SEAL UNIV 5-12 XI (MISCELLANEOUS) ×6 IMPLANT
SET IRRIG Y TYPE TUR BLADDER L (SET/KITS/TRAYS/PACK) ×2 IMPLANT
SET TRI-LUMEN FLTR TB AIRSEAL (TUBING) ×2 IMPLANT
SLEEVE SCD COMPRESS KNEE MED (STOCKING) ×2 IMPLANT
SPIKE FLUID TRANSFER (MISCELLANEOUS) ×2 IMPLANT
SUT VIC AB 0 CT1 36 (SUTURE) ×2 IMPLANT
SUT VICRYL RAPIDE 3 0 (SUTURE) ×4 IMPLANT
SUT VLOC 180 0 9IN GS21 (SUTURE) ×2 IMPLANT
TOWEL OR 17X24 6PK STRL BLUE (TOWEL DISPOSABLE) ×2 IMPLANT
TRAY FOLEY W/BAG SLVR 14FR LF (SET/KITS/TRAYS/PACK) ×2 IMPLANT
TROCAR PORT AIRSEAL 5X120 (TROCAR) ×2 IMPLANT
TROCAR Z-THREAD FIOS 5X100MM (TROCAR) IMPLANT
WATER STERILE IRR 1000ML POUR (IV SOLUTION) ×2 IMPLANT

## 2023-09-14 NOTE — Discharge Summary (Signed)
Physician Discharge Summary  Patient ID: [name] MRN: [id number] DOB/AGE: 56-10-1966 56 y.o.  Admit date: 09/14/2023 Discharge date: 09/14/2023  Admission Diagnoses:symptomatic fibroid uterus  Discharge Diagnoses: same plus adhesions Principal Problem:   Postoperative state   Discharged Condition: good  Hospital Course: Uncomplicated robotic TLH, salpingectomies    Consults: None  Significant Diagnostic Studies: none  Treatments: surgery: Uncomplicated robotic TLH, salpingectomies    Discharge Exam: Blood pressure (!) 127/93, pulse 89, temperature 97.8 F (36.6 C), temperature source Oral, resp. rate 17, height 5\' 7"  (1.702 m), weight 88 kg, last menstrual period 06/22/2018, SpO2 99%.   Disposition: Discharge disposition: 01-Home or Self Care       Discharge Instructions     Call MD for:  temperature >100.4   Complete by: As directed    Diet - low sodium heart healthy   Complete by: As directed    Discharge instructions   Complete by: As directed    No driving on narcotics, no sexual activity for 2 weeks.   Increase activity slowly   Complete by: As directed    May shower / Bathe   Complete by: As directed    Shower, no bath for 2 weeks.   Remove dressing in 24 hours   Complete by: As directed    Sexual Activity Restrictions   Complete by: As directed    No sexual activity for 2 weeks.      Allergies as of 09/14/2023       Reactions   Aspirin Other (See Comments)   wheezing   Latex Hives   Shellfish Allergy Swelling   Shellfish-derived Products Other (See Comments)   Anaphylactic reaction   Penicillins Rash        Medication List     TAKE these medications    albuterol (2.5 MG/3ML) 0.083% nebulizer solution Commonly known as: PROVENTIL Take 2.5 mg by nebulization every 4 (four) hours as needed for wheezing or shortness of breath.   albuterol 108 (90 Base) MCG/ACT inhaler Commonly known as: VENTOLIN HFA INHALE 2 PUFFS BY MOUTH  EVERY 6 HOURS AS NEEDED FOR WHEEZING FOR SHORTNESS OF BREATH   B-12 2500 MCG Subl Place under the tongue.   Caltrate 600+D Plus Minerals 600-800 MG-UNIT Chew Chew 1 tablet by mouth in the morning and at bedtime.   cetirizine 10 MG tablet Commonly known as: ZYRTEC Take 10 mg by mouth daily.   DULoxetine 30 MG capsule Commonly known as: CYMBALTA Take 1 capsule by mouth once daily   Dupixent 300 MG/2ML Sopn Generic drug: Dupilumab INJECT 1 PEN SUBCUTANEOUSLY EVERY 14 DAYS   fluticasone 50 MCG/ACT nasal spray Commonly known as: FLONASE 2 sprays each nostril daily   lisinopril 10 MG tablet Commonly known as: ZESTRIL Take 1.5 tablets (15 mg total) by mouth daily.   multivitamin with minerals Tabs tablet Take 1 tablet by mouth daily. For 50 over.   predniSONE 10 MG tablet Commonly known as: DELTASONE Take 1 tablet (10 mg total) by mouth daily with breakfast. Use sparingly as directed What changed: additional instructions   Trelegy Ellipta 100-62.5-25 MCG/ACT Aepb Generic drug: Fluticasone-Umeclidin-Vilant Inhale 1 puff into the lungs daily. What changed:  when to take this reasons to take this      Percocet sent from office Lovenox sent from office- to do second shot tomorrow- if pt still not mobile PO #2, will need 3rd shot.    Follow-up Information     Carrington Clamp, MD Follow up in 2  week(s).   Specialty: Obstetrics and Gynecology Contact information: [address] [place] [address] [phone]                 Signed: Loney Laurence 09/14/2023, 9:23 AM

## 2023-09-14 NOTE — Brief Op Note (Signed)
09/14/2023  9:10 AM  PATIENT:  [name]  57 y.o. female  PRE-OPERATIVE DIAGNOSIS:  leiomyoma of uterus  POST-OPERATIVE DIAGNOSIS:  leiomyoma of uterus, adhesions of uterus and R ovary  PROCEDURE:  Procedure(s): XI ROBOTIC ASSISTED TOTAL HYSTERECTOMY WITH BILATERAL SALPINGECTOMY (Bilateral) CYSTOSCOPY (N/A),   SURGEON:  Surgeons and Role:    * Carrington Clamp, MD - Primary    * Charlett Nose, MD - Assisting  ANESTHESIA:   general  EBL:  <100  DRAINS: Urinary Catheter (Foley)   LOCAL MEDICATIONS USED:  OTHER ropivicaine and arrista  SPECIMEN:  Source of Specimen:  uterus, cervix, bilateral tubes  DISPOSITION OF SPECIMEN:  PATHOLOGY  COUNTS:  YES  TOURNIQUET:  * No tourniquets in log *  DICTATION: .Note written in EPIC  PLAN OF CARE: Admit for overnight observation  PATIENT DISPOSITION:  PACU - hemodynamically stable.   Delay start of Pharmacological VTE agent (>24hrs) due to surgical blood loss or risk of bleeding: no

## 2023-09-14 NOTE — Discharge Instructions (Signed)
You may take tylenol anytime. You may take oxycodone after 5 pm tonight if needed.

## 2023-09-14 NOTE — Op Note (Signed)
09/14/2023  9:10 AM  PATIENT:  Nariyah [name] Sides  57 y.o. female  PRE-OPERATIVE DIAGNOSIS:  leiomyoma of uterus  POST-OPERATIVE DIAGNOSIS:  leiomyoma of uterus, adhesions of uterus and R ovary  PROCEDURE:  Procedure(s): XI ROBOTIC ASSISTED TOTAL HYSTERECTOMY WITH BILATERAL SALPINGECTOMY (Bilateral) CYSTOSCOPY (N/A),   SURGEON:  Surgeons and Role:    * Carrington Clamp, MD - Primary    * Charlett Nose, MD - Assisting  ANESTHESIA:   general  EBL:  <100  DRAINS: Urinary Catheter (Foley)   LOCAL MEDICATIONS USED:  OTHER ropivicaine and arrista  SPECIMEN:  Source of Specimen:  uterus, cervix, bilateral tubes  DISPOSITION OF SPECIMEN:  PATHOLOGY  COUNTS:  YES  TOURNIQUET:  * No tourniquets in log *  DICTATION: .Note written in EPIC  PLAN OF CARE: Admit for overnight observation  PATIENT DISPOSITION:  PACU - hemodynamically stable.   Delay start of Pharmacological VTE agent (>24hrs) due to surgical blood loss or risk of bleeding: no  Complications:  None.  Findings:  12 weeks size uterus with adhesions to cul de sac.  Ovaries were normal.  The ureters were identified during multiple points of the case and were always out of the field of dissection.  On cystoscopy, the bladder was intact and bilateral spill was seen from each ureteral oriface.      Technique:   After adequate anesthesia was achieved the patient was positioned, prepped and draped in usual sterile fashion.  A speculum was placed in the vagina and the cervix dilated with pratt dilators.  The 2.5 cm Koh ring Advincula was assembled and placed in proper fashion.  The  Speculum was removed and the bladder catheterized with a foley.     Attention was turned to the abdomen where a 1 cm incision was made 1 cm above the umbilicus.  The veress needle was introduced without aspiration of bowel contents or blood and the abdomen insufflated. The 8.5 mm Robotic trocar was placed and the other three trocar sites were  marked out, all approximately 10 cm from each other and the camera.  Two 8.5 mm trocars were placed on either side of the camera port and a 5 mm assistant port was placed 3 cm above the line of the other trocars.  All trocars were inserted under direct visualization of the camera.  The patient was placed in trendelenburg and then the Robot docked.  The fenestrated bipolar were placed on arm 1 and the Hot shears on arm 3 and introduced under direct visualization of the camera.   I then broke scrub and sat down at the console.  The above findings were noted and the ureters identified well out of the field of dissection.  The right fallopian tube was isolated and cauterized with the bipolar.  The Utero-ovarian ligament was then divided with the bipolar cautery and shears.  The posterior broad ligament was then divided with the hot shears until the uterosacral ligament.  The Broad and cardinal ligaments were then cauterized against the cervix to the level of the Koh ring, securing the uterine artery.  Each pedicle was then incised with the shears.  The anterior leaf was then incised at the reflection of the vessico-uterine junction and the lateral bladder retracted inferiorly after the round ligament had been divided with the bipolar forceps.  The left tube was cauterized with the bipolar and divided with the shears;  then the left utero-ovarian ligament divided with the bipolar forceps and the scissors.  The  round ligament was divided as well and the posterior leaf of the broad ligament then divided with the hot shears. The broad and cardinal ligaments were then cauterized on the left in the same way.   At the level of the internal os, the uterine arteries were bilaterally cauterized with the bipolar.  The ureters were identified well out of the field of dissection.     The bladder was then able to be retracted inferiorly and the vesico-uterine fascia was incised in the midline until the bladder was removed one cm  below the Koh ring.  The hot shears then circumferentially incised the vagina at the level of the reflection on the [facility] ring.  Once the uterus and cervix were amputated, cautery was used to insure hemostasis of the cuff.  Once hemostasis was achieved, the scissors were changed to the mega suture cut needle driver and the cuff was closed with a running stitches of 0-vicryl V loc.  Cautery was used to ensure hemostasis of the left pedicles very superficially. The ureters were peristalsing bilaterally well and very lateral to the areas of operation.     The Robot was then undocked and I scrubbed back in.  The needle was removed and Ropivicaine was introduced into the pelvis. The skin incisions were closed with subcuticular stitches of 3-0 vicryl Rapide and Dermabond.  All instruments were removed from the vagina and cystoscopy performed, revealing an intact bladder and vigourous spill of urine from each ureteral orifice.  The cystoscope was removed and the patient taken to the recovery room in stable condition.   Jonty Morrical A

## 2023-09-14 NOTE — Anesthesia Postprocedure Evaluation (Signed)
Anesthesia Post Note  Patient: [name]  Procedure(s) Performed: XI ROBOTIC ASSISTED TOTAL HYSTERECTOMY WITH BILATERAL SALPINGECTOMY (Bilateral) CYSTOSCOPY     Patient location during evaluation: PACU Anesthesia Type: General Level of consciousness: awake Pain management: pain level controlled Vital Signs Assessment: post-procedure vital signs reviewed and stable Respiratory status: spontaneous breathing, nonlabored ventilation and respiratory function stable Cardiovascular status: blood pressure returned to baseline and stable Postop Assessment: no apparent nausea or vomiting Anesthetic complications: no   No notable events documented.  Last Vitals:  Vitals:   09/14/23 1015 09/14/23 1035  BP: 139/88 (!) 143/90  Pulse: 67 74  Resp: 16 16  Temp:  36.8 C  SpO2: 94% 100%    Last Pain:  Vitals:   09/14/23 1035  TempSrc: Oral  PainSc:                  Linton Rump

## 2023-09-14 NOTE — Progress Notes (Signed)
There has been no change in the patients history, status or exam since the history and physical.  Vitals:   08/31/23 0952 09/14/23 0618  BP:  (!) 127/93  Pulse:  89  Resp:  17  Temp:  97.8 F (36.6 C)  TempSrc:  Oral  SpO2:  99%  Weight: 88.9 kg 88 kg  Height:  5\' 7"  (1.702 m)    No results found for this or any previous visit (from the past 72 hour(s)).  [name]

## 2023-09-14 NOTE — Transfer of Care (Signed)
Immediate Anesthesia Transfer of Care Note  Patient: [name]  Procedure(s) Performed: XI ROBOTIC ASSISTED TOTAL HYSTERECTOMY WITH BILATERAL SALPINGECTOMY (Bilateral) CYSTOSCOPY  Patient Location: PACU  Anesthesia Type:General  Level of Consciousness: awake, alert , and oriented  Airway & Oxygen Therapy: Patient Spontanous Breathing and Patient connected to nasal cannula oxygen  Post-op Assessment: Report given to RN and Post -op Vital signs reviewed and stable  Post vital signs: Reviewed and stable  Last Vitals:  Vitals Value Taken Time  BP 125/104 09/14/23 0923  Temp 36.4 C 09/14/23 0923  Pulse 71 09/14/23 0930  Resp 19 09/14/23 0930  SpO2 95 % 09/14/23 0930  Vitals shown include unfiled device data.  Last Pain:  Vitals:   09/14/23 0618  TempSrc: Oral  PainSc: 3       Patients Stated Pain Goal: 3 (09/14/23 0618)  Complications: No notable events documented.

## 2023-09-14 NOTE — Anesthesia Procedure Notes (Signed)
Procedure Name: Intubation Date/Time: 09/14/2023 7:38 AM  Performed by: Tyheim Vanalstyne D, CRNAPre-anesthesia Checklist: Patient identified, Emergency Drugs available, Suction available and Patient being monitored Patient Re-evaluated:Patient Re-evaluated prior to induction Oxygen Delivery Method: Circle system utilized Preoxygenation: Pre-oxygenation with 100% oxygen Induction Type: IV induction Ventilation: Mask ventilation without difficulty Laryngoscope Size: Mac and 3 Grade View: Grade I Tube type: Oral Number of attempts: 1 Airway Equipment and Method: Stylet and Oral airway Placement Confirmation: ETT inserted through vocal cords under direct vision, positive ETCO2 and breath sounds checked- equal and bilateral Secured at: 22 cm Tube secured with: Tape Dental Injury: Teeth and Oropharynx as per pre-operative assessment

## 2023-09-15 LAB — TYPE AND SCREEN
ABO/RH(D): O POS
Antibody Screen: NEGATIVE

## 2023-09-15 LAB — SURGICAL PATHOLOGY

## 2023-09-16 ENCOUNTER — Encounter (HOSPITAL_BASED_OUTPATIENT_CLINIC_OR_DEPARTMENT_OTHER): Payer: Self-pay | Admitting: Obstetrics and Gynecology

## 2023-09-20 ENCOUNTER — Ambulatory Visit: Payer: BC Managed Care – PPO | Admitting: Physician Assistant

## 2023-09-27 ENCOUNTER — Telehealth: Payer: Self-pay | Admitting: Internal Medicine

## 2023-09-27 NOTE — Telephone Encounter (Signed)
She would probably do better to contact [facility] since I have very poor availability over the next several days , and her workup so far has indicated mostly osteoarthritis related symptoms.

## 2023-09-27 NOTE — Telephone Encounter (Signed)
Patient called stating she is experiencing bilateral hand and ankle pain.  Patient states Dr. Dimple Casey referred her to orthopedics and is not sure which office to schedule an appointment for these symptoms.  Patient requested a return call.

## 2023-09-28 NOTE — Telephone Encounter (Signed)
I called patient, patient will call [facility] to schedule appt.

## 2023-10-07 ENCOUNTER — Ambulatory Visit: Payer: BC Managed Care – PPO | Admitting: Surgical

## 2023-10-07 ENCOUNTER — Other Ambulatory Visit: Payer: Self-pay

## 2023-10-07 DIAGNOSIS — M1712 Unilateral primary osteoarthritis, left knee: Secondary | ICD-10-CM | POA: Diagnosis not present

## 2023-10-07 DIAGNOSIS — M722 Plantar fascial fibromatosis: Secondary | ICD-10-CM

## 2023-10-07 MED ORDER — PREDNISONE 10 MG (21) PO TBPK: ORAL_TABLET | ORAL | 0 refills | Status: DC

## 2023-10-08 ENCOUNTER — Encounter: Payer: Self-pay | Admitting: Surgical

## 2023-10-08 NOTE — Progress Notes (Signed)
Office Visit Note   Patient: [name]           Date of Birth: April 18, 1966           MRN: [id number] Visit Date: 10/07/2023 Requested by: Sandford Craze, NP [address] [place],  [place] [address] PCP: Sandford Craze, NP  Subjective: Chief Complaint  Patient presents with  . Right Foot - Pain  . Left Foot - Pain    HPI: Hillarie Linzey Lykins is a 57 y.o. female who presents to the office reporting left ankle pain and bilateral foot pain.  Patient reports symptoms that have been ongoing for about 3 weeks without history of injury.  She localizes pain to the plantar aspect of both feet as well as the medial aspect of her left ankle that radiates into the left medial midfoot.  Pain has been steadily worsening.  She has had similar plantar foot pain in the past for which she has seen podiatry for and was diagnosed with plantar fasciitis.  Left foot is bothering her fairly significantly more than her right foot.  No history of prior surgery to the ankle or foot.  She is somewhat interested in plantar fasciitis injections though she has never had these before.  She has had physical therapy for plantar fasciitis before and she has used modalities at home such as rolling her foot on a tennis ball and a water bottle in order to help with the plantar fascia pain.  In regards to her knee pain that she was previously seen for, knee is feeling significantly better and the gel injections have been very helpful for her..                ROS: All systems reviewed are negative as they relate to the chief complaint within the history of present illness.  Patient denies fevers or chills.  Assessment & Plan: Visit Diagnoses:  1. Unilateral primary osteoarthritis, left knee   2. Plantar fasciitis     Plan: Patient is a 57 year old female who presents for evaluation of bilateral foot pain and left ankle pain.  She has pain that began about 3 weeks ago primarily in the left ankle  and she localizes pain to the medial aspect of the ankle.  She does have symptoms of plantar fasciitis but I think that this is a secondary problem to what ever is causing pain in the medial aspect of her ankle.  She has pes planus deformity that is noted on exam of both feet and this is chronic for her based on previous radiographs from podiatry showing significant loss of the arch of her foot on lateral radiographs.  She does have moderate arthritis of the talonavicular joint in both feet which may be playing some role in her medial sided pain on the left but ultrasound examination today demonstrates small amount of interstitial tearing of the posterior tib tendon with a small amount of hypoechoic fluid noted in the tendon sheath concerning for posterior tibial tendon insufficiency.  Tendon still appears to be attached.  We discussed options such as physical therapy versus MRI scan versus plantar fascial injections.  After discussion, we will try course of prednisone to see if this will help with some of the inflammatory pain and she was provided with Aircast PTTD brace to see if this will help offload the posterior tib tendon.  If she has no significant improvement of symptoms after the completion of the course of prednisone then  encourage patient to reach out to [place] and we will order MRI scan for further evaluation of posterior tib tendon pathology.  Additionally for the plantar fascial pain, we did discuss the possibility of plantar fascia injection.  After discussing that these are quite painful injections, patient would like to hold off on this.  We will try and see how shockwave therapy does for her and referral made to Dr. Shon Baton.  Follow-Up Instructions: No follow-ups on file.   Orders:  Orders Placed This Encounter  Procedures  . US Guided Needle Placement - No Linked Charges  . Ambulatory referral to Orthopedic Surgery   Meds ordered this encounter  Medications  . predniSONE (STERAPRED  UNI-PAK 21 TAB) 10 MG (21) TBPK tablet    Sig: Take as directed on package    Dispense:  21 tablet    Refill:  0      Procedures: No procedures performed   Clinical Data: No additional findings.  Objective: Vital Signs: LMP 06/22/2018   Physical Exam:  Constitutional: Patient appears well-developed HEENT:  Head: Normocephalic Eyes:EOM are normal Neck: Normal range of motion Cardiovascular: Normal rate Pulmonary/chest: Effort normal Neurologic: Patient is alert Skin: Skin is warm Psychiatric: Patient has normal mood and affect  Ortho Exam: Ortho exam demonstrates left foot and right foot/ankle with intact ankle dorsiflexion, plantarflexion, inversion, eversion.  Inversion does reproduce pain on the left side but not the right.  She has tenderness over the plantar fascial insertion on the calcaneus in both feet.  There is no ecchymosis or bruising noted in either leg.  She does have tenderness along the course of the posterior tib tendon on the left foot but not the right.  She has positive single-leg heel raise on the left but not the right.  She has pes planus deformity noted to bilateral feet.  Mild tenderness over the talonavicular joint of the left foot but not the right.  Mild tenderness over the ankle joint line of the left foot but not the right.  No tenderness over the fifth metatarsal base with either foot.  There is no plantar ecchymosis noted.  No calf tenderness.  Negative Homans' sign.  Palpable DP pulse of bilateral feet.  Specialty Comments:  No specialty comments available.  Imaging: No results found.   PMFS History: Patient Active Problem List   Diagnosis Date Noted  . Postoperative state 09/14/2023  . Pain in left knee 08/30/2023  . Thyromegaly 05/07/2023  . Effusion, left knee 05/03/2023  . Polyarthralgia 12/27/2022  . Osteoarthritis 09/20/2022  . Osteopenia 07/01/2022  . Hair loss 05/14/2022  . Plantar fasciitis 05/03/2022  . 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 Grief reaction 11/16/2021  . Positive ANA (antinuclear antibody) 10/15/2021  . Myalgia 10/14/2021  . Chronic pansinusitis 08/26/2021  . History of sinus surgery 07/31/2021  . Nasal polyps   . Hyperlipidemia   . Clotting disorder (HCC)   . Allergy   . Severe persistent asthma 03/24/2021  . Steroid dependence (HCC) 04/01/2020  . Diabetes type 2, controlled (HCC)   . Chronic venous insufficiency 09/16/2016  . 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  . Uterine leiomyoma 05/25/2016  . 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  . Rectal bleeding 10/20/2015  . Obesity 04/27/2015  . Abnormal EKG 03/19/2015  . Preventative health care 02/17/2015  . OCD (obsessive compulsive disorder) 01/14/2015  . Essential hypertension 01/13/2015  . Peripheral edema 06/17/2012  . NASAL POLYP 01/01/2008  . Seasonal and perennial allergic  rhinitis 01/01/2008  . Moderate persistent asthma 01/01/2008  . DEEP VENOUS THROMBOPHLEBITIS, HX OF 01/01/2008  . History of DVT (deep vein thrombosis) 2005   Past Medical History:  Diagnosis Date  . Allergic rhinitis, cause unspecified   . Allergy   . Arthritis    left knee  . Asthma    hx of severe asthma w/ copd, follows w/ Dr. [name], pulmonologist and PCP (Per 08/30/23 note from [name], NP, asthma is much improved on Dupixent and Trellegy.) As of 08/30/23, per patient, last asthma exacerbation in early 05/2023 when patient went to [place]. Patient states her asthma is greatly improved on Dupixent injections and that she rarely uses prednisone.  . Clotting disorder (HCC)    DVT 2005  . Complication of anesthesia   . Diabetes type 2, controlled (HCC)    no meds, controlled with diet and exercise  . History of DVT (deep vein thrombosis) 2005   Pt reports blood clot below knee ?unsure which leg.  [name] Hyperlipidemia   . Nasal polyps   . Osteopenia   . PONV (postoperative nausea and vomiting)   .  Unspecified essential hypertension    Follows w/ PCP.  [name] Wears glasses     Family History  Problem Relation Age of Onset  . Cancer Mother 93       ?carcinoid tumor (had surgical removal)  . Diabetes Mother        borderline  . Stomach cancer Mother   . Liver cancer Mother   . Heart disease Father        died 38, from MI, had hx of ETOH abuse and liver disease  . Thyroid cancer Sister        (diagnosed at 82) half sister (same dad)   . Diabetes type II Brother   . Kidney disease Neg Hx   . Hypertension Neg Hx   . Hyperlipidemia Neg Hx   . Colon cancer Neg Hx   . Esophageal cancer Neg Hx   . Rectal cancer Neg Hx   . Colon polyps Neg Hx     Past Surgical History:  Procedure Laterality Date  . COLONOSCOPY     02/05/21 & 12/12/18hx of colon polyps  . CYSTOSCOPY N/A 09/14/2023   Procedure: CYSTOSCOPY;  Surgeon: Carrington Clamp, MD;  Location: [facility];  Service: Gynecology;  Laterality: N/A;  . DILATION AND CURETTAGE OF UTERUS  2017   removed fibroids  . LASIK Bilateral 2008  . NASAL SINUS SURGERY  2008   Dr Lazarus Salines  . OVARIAN CYST REMOVAL  02/2016   pt reported-- Dr [name]  . POLYPECTOMY    . ROBOTIC ASSISTED TOTAL HYSTERECTOMY WITH BILATERAL SALPINGO OOPHERECTOMY Bilateral 09/14/2023   Procedure: XI ROBOTIC ASSISTED TOTAL HYSTERECTOMY WITH BILATERAL SALPINGECTOMY;  Surgeon: Carrington Clamp, MD;  Location: [facility] [place];  Service: Gynecology;  Laterality: Bilateral;  . UTERINE FIBROID SURGERY  2005   Social History   Occupational History  . Occupation: Engineer, [employer]  Tobacco Use  . Smoking status: Never    Passive exposure: Past  . Smokeless tobacco: Never  Vaping Use  . Vaping status: Never Used  Substance and Sexual Activity  . Alcohol use: Not Currently  . Drug use: No  . Sexual activity: Yes    Birth control/protection: None, Post-menopausal

## 2023-10-21 ENCOUNTER — Encounter: Payer: Self-pay | Admitting: Sports Medicine

## 2023-10-21 ENCOUNTER — Ambulatory Visit (INDEPENDENT_AMBULATORY_CARE_PROVIDER_SITE_OTHER): Payer: BC Managed Care – PPO | Admitting: Sports Medicine

## 2023-10-21 DIAGNOSIS — M76822 Posterior tibial tendinitis, left leg: Secondary | ICD-10-CM

## 2023-10-21 DIAGNOSIS — M79671 Pain in right foot: Secondary | ICD-10-CM

## 2023-10-21 DIAGNOSIS — Q666 Other congenital valgus deformities of feet: Secondary | ICD-10-CM | POA: Diagnosis not present

## 2023-10-21 DIAGNOSIS — M79672 Pain in left foot: Secondary | ICD-10-CM

## 2023-10-21 DIAGNOSIS — M722 Plantar fascial fibromatosis: Secondary | ICD-10-CM | POA: Diagnosis not present

## 2023-10-21 NOTE — Progress Notes (Signed)
[name] - 57 y.o. female MRN [id number]  Date of birth: July 12, 1966  Office Visit Note: Visit Date: 10/21/2023 PCP: Sandford Craze, NP Referred by: Julieanne Cotton, PA-C  Subjective: Chief Complaint  Patient presents with   Right Foot - Pain   Left Foot - Pain   HPI: [name] is a very pleasant 57 y.o. female who presents today for bilateral foot/ankle pain.  She has had acute on chronic bilateral foot and ankle pain.  She has pain in the arch of the foot and sometimes in the heel.  She has been doing calf and plantar fascia exercises at home consistently.  In past she tried a over-the-counter pair of orthotics with only mild relief.  She also is having pain over the left posterior tibial tendon. 10/07/23 - given prednisone course. This was helpful for her symptoms temporarily.  Otherwise, she takes Tylenol for pain control.  Pertinent ROS were reviewed with the patient and found to be negative unless otherwise specified above in HPI.   Assessment & Plan: Visit Diagnoses:  1. Bilateral foot pain   2. Pes planovalgus   3. 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  I discussed with her today that I think most of her conditions are secondary to her pes planovalgus as she has significant loss of her longitudinal arch. This is contributing to her plantar fascia pain as well as her talonavicular and chopart joint arthritis ( L > R).  She also is dealing with left-sided posterior tibial tendon insufficiency with likely a degree of tenosynovitis.  The origin of her pain would be treating her arches with both cushioned and rigid orthotics.  We did send a referral to the sports medicine center to have her fitted for these.  We also treated the tendon as well as her plantar fascia and medial arch with extracorporeal shockwave treatment today.  She did notice good  improvement even minutes following the procedure today.  She will follow-up next week for repeat treatment and we will see after 2 treatments what degree of relief she is getting before deciding on additional treatments. F/u in 1-week.  Follow-up: Return in about 1 week (around 10/28/2023) for for bilateral feet.   Meds & Orders: No orders of the defined types were placed in this encounter.  No orders of the defined types were placed in this encounter.    Procedures: Procedure: ECSWT Indications:  Plantar fasciitis, arch pain, PT tendon dysfunction   Procedure Details Consent: Risks of procedure as well as the alternatives and risks of each were explained to the patient.  Verbal consent for procedure obtained. Time Out: Verified patient identification, verified procedure, site was marked, verified correct patient position. The area was cleaned with alcohol swab.     The right plantar fascia and foot arch was targeted for Extracorporeal shockwave therapy.    Preset: Plantar fasciitis Power Level: 90 mJ Frequency: 10 Hz Impulse/cycles: 2000 Head size: Regular   Patient tolerated procedure well without immediate complications. ________________________________________________________  The left plantar fascia and posterior tibial tendon was targeted for Extracorporeal shockwave therapy.    Preset: Plantar fasciitis Power Level: 90 mJ Frequency: 11 Hz Impulse/cycles: 2500 Head size: Regular   Patient tolerated procedure well without immediate complications.       Clinical History: No specialty comments available.  She reports that she has never  smoked. She has been exposed to tobacco smoke. She has never used smokeless tobacco.  Recent Labs    12/31/22 1228 05/03/23 1758 08/30/23 0906  HGBA1C 6.4 6.1 6.6*   Objective:    Physical Exam  Gen: Well-appearing, in no acute distress; non-toxic CV: Well-perfused. Warm.  Resp: Breathing unlabored on room air; no  wheezing. Psych: Fluid speech in conversation; appropriate affect; normal thought process Neuro: Sensation intact throughout. No gross coordination deficits.   Ortho Exam - Bilateral feet/ankles: There is notable pes planovalgus with significant loss of longitudinal arch bilaterally.  She does have a degree of posterior tibial insufficiency on the left side.  Imaging:  *Independent review and interpretation of left ankle, right ankle, left foot, right foot x-rays from 05/19/2022 were performed by myself today.  2 views of both feet and 2 views of both ankles were reviewed.  X-rays demonstrate notable pes planus.  There is moderate talonavicular and midfoot arthritic change of the left greater than right foot.  Small Haglund deformity on the left foot.  No acute fracture or otherwise bony abnormality noted.  Past Medical/Family/Surgical/Social History: Medications & Allergies reviewed per EMR, new medications updated. 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   History of sinus surgery 07/31/2021   Nasal polyps    Hyperlipidemia    Clotting disorder (HCC)    Allergy    Severe persistent asthma 03/24/2021   Steroid dependence (HCC) 04/01/2020   Diabetes type 2, controlled (HCC)    Chronic venous insufficiency 09/16/2016   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   Uterine leiomyoma 05/25/2016   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   Rectal bleeding 10/20/2015   Obesity 04/27/2015   Abnormal EKG 03/19/2015    Preventative health care 02/17/2015   OCD (obsessive compulsive disorder) 01/14/2015   Essential hypertension 01/13/2015   Peripheral edema 06/17/2012   NASAL POLYP 01/01/2008   Seasonal and perennial allergic rhinitis 01/01/2008   Moderate persistent asthma 01/01/2008   DEEP VENOUS THROMBOPHLEBITIS, HX OF 01/01/2008   History of DVT (deep vein thrombosis) 2005   Past Medical History:  Diagnosis Date   Allergic rhinitis, cause unspecified    Allergy    Arthritis    left knee   Asthma    hx of severe asthma w/ copd, follows w/ Dr. [name], pulmonologist and PCP (Per 08/30/23 note from [name], NP, asthma is much improved on Dupixent and Trellegy.) As of 08/30/23, per patient, last asthma exacerbation in early 05/2023 when patient went to [place]. Patient states her asthma is greatly improved on Dupixent injections and that she rarely uses prednisone.   Clotting disorder (HCC)    DVT 2005   Complication of anesthesia    Diabetes type 2, controlled (HCC)    no meds, controlled with diet and exercise   History of DVT (deep vein thrombosis) 2005   Pt reports blood clot below knee ?unsure which leg.   Hyperlipidemia    Nasal polyps    Osteopenia    PONV (postoperative nausea and vomiting)  Unspecified essential hypertension    Follows w/ PCP.   Wears glasses    Family History  Problem Relation Age of Onset   Cancer Mother 77       ?carcinoid tumor (had surgical removal)   Diabetes Mother        borderline   Stomach cancer Mother    Liver cancer Mother    Heart disease Father        died 67, from MI, had hx of ETOH abuse and liver disease   Thyroid cancer Sister        (diagnosed at 68) half sister (same dad)    Diabetes type II Brother    Kidney disease Neg Hx    Hypertension Neg Hx    Hyperlipidemia Neg Hx    Colon cancer Neg Hx    Esophageal cancer Neg Hx    Rectal cancer Neg Hx    Colon polyps Neg Hx    Past Surgical History:  Procedure Laterality Date    COLONOSCOPY     02/05/21 & 12/12/18hx of colon polyps   CYSTOSCOPY N/A 09/14/2023   Procedure: CYSTOSCOPY;  Surgeon: Carrington Clamp, MD;  Location: [facility] [place];  Service: Gynecology;  Laterality: N/A;   DILATION AND CURETTAGE OF UTERUS  2017   removed fibroids   LASIK Bilateral 2008   NASAL SINUS SURGERY  2008   Dr Lazarus Salines   OVARIAN CYST REMOVAL  02/2016   pt reported-- Dr [name]   POLYPECTOMY     ROBOTIC ASSISTED TOTAL HYSTERECTOMY WITH BILATERAL SALPINGO OOPHERECTOMY Bilateral 09/14/2023   Procedure: XI ROBOTIC ASSISTED TOTAL HYSTERECTOMY WITH BILATERAL SALPINGECTOMY;  Surgeon: Carrington Clamp, MD;  Location[facility];  Service: Gynecology;  Laterality: Bilateral;   UTERINE FIBROID SURGERY  2005   Social History   Occupational History   Occupation: Production designer, [employer]  Tobacco Use   Smoking status: Never    Passive exposure: Past   Smokeless tobacco: Never  Vaping Use   Vaping status: Never Used  Substance and Sexual Activity   Alcohol use: Not Currently   Drug use: No   Sexual activity: Yes    Birth control/protection: None, Post-menopausal

## 2023-10-25 ENCOUNTER — Telehealth: Payer: Self-pay | Admitting: Sports Medicine

## 2023-10-25 ENCOUNTER — Telehealth: Payer: Self-pay | Admitting: Internal Medicine

## 2023-10-25 NOTE — Telephone Encounter (Signed)
Patient is running out of Dupixent and is running out. She would like a call back

## 2023-10-26 NOTE — Telephone Encounter (Signed)
A Dupixent prescription for 11-month supply was sent on 09/09/2023. She should have enough to get through the end of the year  Chesley Mires, PharmD, MPH, BCPS, CPP Clinical Pharmacist (Rheumatology and Pulmonology)

## 2023-10-28 ENCOUNTER — Ambulatory Visit (INDEPENDENT_AMBULATORY_CARE_PROVIDER_SITE_OTHER): Payer: BC Managed Care – PPO | Admitting: Sports Medicine

## 2023-10-28 ENCOUNTER — Encounter: Payer: Self-pay | Admitting: Sports Medicine

## 2023-10-28 DIAGNOSIS — M79671 Pain in right foot: Secondary | ICD-10-CM

## 2023-10-28 DIAGNOSIS — Q666 Other congenital valgus deformities of feet: Secondary | ICD-10-CM | POA: Diagnosis not present

## 2023-10-28 DIAGNOSIS — M722 Plantar fascial fibromatosis: Secondary | ICD-10-CM | POA: Diagnosis not present

## 2023-10-28 DIAGNOSIS — M76822 Posterior tibial tendinitis, left leg: Secondary | ICD-10-CM | POA: Diagnosis not present

## 2023-10-28 DIAGNOSIS — M79672 Pain in left foot: Secondary | ICD-10-CM

## 2023-10-28 NOTE — Progress Notes (Signed)
Patient says that she has had a lot of relief from the shockwave therapy. She says that right after the treatment she had no pain, and for most of the week felt good. She said that beginning yesterday she began having some pain again.

## 2023-10-28 NOTE — Progress Notes (Signed)
[name] - 57 y.o. female MRN [id number]  Date of birth: 1966-05-30  Office Visit Note: Visit Date: 10/28/2023 PCP: Sandford Craze, NP Referred by: Sandford Craze, NP  Subjective: Chief Complaint  Patient presents with   Right Foot - Follow-up   Left Foot - Follow-up   HPI: [name] is a pleasant 57 y.o. female who presents today for bilateral foot/ankle pain with plantar fasciitis and pes planovalgus.  Last visit we did perform a trial of extracorporeal shockwave therapy for her bilateral plantar fascia as well as her left posterior tibial tendon.  She reports having excellent relief from this.  She essentially had no pain right after the treatment and this lasted throughout the week.  Her left medial ankle/foot is starting to slightly bother her again near her posterior tibial tendon.  Pertinent ROS were reviewed with the patient and found to be negative unless otherwise specified above in HPI.   Assessment & Plan: Visit Diagnoses:  1. Bilateral foot pain   2. 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 Plantar fasciitis    Plan: Marcia received excellent relief of her bilateral foot pain with concomitant plantar fasciitis and L-PT tendon insufficiency after our first trial of extracorporeal shockwave therapy.  We did repeat treatment today.  I do think most of her pain is secondary to her pes planovalgus with significant loss of the longitudinal arch, as we discussed at last visit.  The origin of her pain would be treating her arches with both cushioned and rigid orthotics, she does have an upcoming appointment with sports medicine center to be fitted for these.  We will see how she responds with today's treatment and after being fitted for the orthotics, she will follow-up in 2 weeks for repeat evaluation and consideration of additional ESWT.  Okay for Tylenol or over-the-counter over-the-counter meds as needed.  Follow-up:  Return in about 2 weeks (around 11/11/2023) for bilateral feet.   Meds & Orders: No orders of the defined types were placed in this encounter.  No orders of the defined types were placed in this encounter.    Procedures: Procedure: ECSWT Indications:  Plantar fasciitis, arch pain, PT tendon dysfunction   Procedure Details Consent: Risks of procedure as well as the alternatives and risks of each were explained to the patient.  Verbal consent for procedure obtained. Time Out: Verified patient identification, verified procedure, site was marked, verified correct patient position. The area was cleaned with alcohol swab.     The right plantar fascia and foot arch was targeted for Extracorporeal shockwave therapy.    Preset: Plantar fasciitis Power Level: 90 mJ   Frequency: 11 Hz Impulse/cycles: 2000 Head size: Regular   Patient tolerated procedure well without immediate complications. ________________________________________________________   The left plantar fascia and posterior tibial tendon was targeted for Extracorporeal shockwave therapy.    Preset: Plantar fasciitis Power Level: 90 mJ  Frequency: 11 Hz Impulse/cycles: 2500 Head size: Regular   Patient tolerated procedure well without immediate complications.      Clinical History: No specialty comments available.  She reports that she has never smoked. She has been exposed to tobacco smoke. She has never used smokeless tobacco.  Recent Labs    12/31/22 1228 05/03/23 1758 08/30/23 0906  HGBA1C 6.4 6.1 6.6*    Objective:   Vital Signs: LMP 06/22/2018   Physical Exam  Gen: Well-appearing, in no acute distress; non-toxic CV: Well-perfused. Warm.  Resp: Breathing  unlabored on room air; no wheezing. Psych: Fluid speech in conversation; appropriate affect; normal thought process Neuro: Sensation intact throughout. No gross coordination deficits.   Ortho Exam - Bilateral feet/ankles: There is notable pes planovalgus  with significant loss of longitudinal arch bilaterally.  She does have some posterior tibial insufficiency on the left side with poor heel inversion upon heel raise.  Imaging: No results found.  Past Medical/Family/Surgical/Social History: Medications & Allergies reviewed per EMR, new medications updated. 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   History of sinus surgery 07/31/2021   Nasal polyps    Hyperlipidemia    Clotting disorder (HCC)    Allergy    Severe persistent asthma 03/24/2021   Steroid dependence (HCC) 04/01/2020   Diabetes type 2, controlled (HCC)    Chronic venous insufficiency 09/16/2016   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   Uterine leiomyoma 05/25/2016   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   Rectal bleeding 10/20/2015   Obesity 04/27/2015   Abnormal EKG 03/19/2015   Preventative health care 02/17/2015   OCD (obsessive compulsive disorder) 01/14/2015   Essential hypertension 01/13/2015   Peripheral edema 06/17/2012   NASAL POLYP 01/01/2008   Seasonal and perennial allergic rhinitis 01/01/2008   Moderate persistent asthma 01/01/2008   DEEP VENOUS THROMBOPHLEBITIS, HX OF 01/01/2008   History of DVT (deep vein thrombosis) 2005   Past Medical History:  Diagnosis Date   Allergic rhinitis, cause unspecified    Allergy    Arthritis    left knee   Asthma    hx of severe asthma w/ copd, follows w/ Dr. [name], pulmonologist and PCP (Per 08/30/23 note from [name],  NP, asthma is much improved on Dupixent and Trellegy.) As of 08/30/23, per patient, last asthma exacerbation in early 05/2023 when patient went to [place]. Patient states her asthma is greatly improved on Dupixent injections and that she rarely uses prednisone.   Clotting disorder (HCC)    DVT 2005   Complication of anesthesia    Diabetes type 2, controlled (HCC)    no meds, controlled with diet and exercise   History of DVT (deep vein thrombosis) 2005   Pt reports blood clot below knee ?unsure which leg.   Hyperlipidemia    Nasal polyps    Osteopenia    PONV (postoperative nausea and vomiting)    Unspecified essential hypertension    Follows w/ PCP.   Wears glasses    Family History  Problem Relation Age of Onset   Cancer Mother 102       ?carcinoid tumor (had surgical removal)   Diabetes Mother        borderline   Stomach cancer Mother    Liver cancer Mother    Heart disease Father        died 75, from MI, had hx of ETOH abuse and liver disease   Thyroid cancer Sister        (diagnosed at 2) half sister (same dad)    Diabetes type II Brother    Kidney disease Neg  Hx    Hypertension Neg Hx    Hyperlipidemia Neg Hx    Colon cancer Neg Hx    Esophageal cancer Neg Hx    Rectal cancer Neg Hx    Colon polyps Neg Hx    Past Surgical History:  Procedure Laterality Date   COLONOSCOPY     02/05/21 & 12/12/18hx of colon polyps   CYSTOSCOPY N/A 09/14/2023   Procedure: CYSTOSCOPY;  Surgeon: Carrington Clamp, MD;  Location: [facility];  Service: Gynecology;  Laterality: N/A;   DILATION AND CURETTAGE OF UTERUS  2017   removed fibroids   LASIK Bilateral 2008   NASAL SINUS SURGERY  2008   Dr Lazarus Salines   OVARIAN CYST REMOVAL  02/2016   pt reported-- Dr [name]   POLYPECTOMY     ROBOTIC ASSISTED TOTAL HYSTERECTOMY WITH BILATERAL SALPINGO OOPHERECTOMY Bilateral 09/14/2023   Procedure: XI ROBOTIC ASSISTED TOTAL HYSTERECTOMY WITH BILATERAL SALPINGECTOMY;  Surgeon: Carrington Clamp, MD;  Location: [facility];  Service: Gynecology;  Laterality: Bilateral;   UTERINE FIBROID SURGERY  2005   Social History   Occupational History   Occupation: Production designer, [employer]  Tobacco Use   Smoking status: Never    Passive exposure: Past   Smokeless tobacco: Never  Vaping Use   Vaping status: Never Used  Substance and Sexual Activity   Alcohol use: Not Currently   Drug use: No   Sexual activity: Yes    Birth control/protection: None, Post-menopausal

## 2023-11-01 ENCOUNTER — Encounter: Payer: Self-pay | Admitting: Family Medicine

## 2023-11-01 ENCOUNTER — Ambulatory Visit (INDEPENDENT_AMBULATORY_CARE_PROVIDER_SITE_OTHER): Payer: BC Managed Care – PPO | Admitting: Family Medicine

## 2023-11-01 ENCOUNTER — Other Ambulatory Visit: Payer: Self-pay | Admitting: Sports Medicine

## 2023-11-01 ENCOUNTER — Encounter: Payer: Self-pay | Admitting: Sports Medicine

## 2023-11-01 VITALS — BP 124/82 | Ht 67.0 in | Wt 192.0 lb

## 2023-11-01 DIAGNOSIS — M722 Plantar fascial fibromatosis: Secondary | ICD-10-CM

## 2023-11-01 MED ORDER — CELECOXIB 100 MG PO CAPS: 100.0000 mg | ORAL_CAPSULE | Freq: Two times a day (BID) | ORAL | 1 refills | Status: DC

## 2023-11-01 NOTE — Progress Notes (Addendum)
Chief Complaint: Bilateral foot pain  HPI: Patient is presenting with bilateral foot pain, per patient the pain is located usually in the middle arches of her bilateral feet.  Patient was recently [facility] in [facility] who recommended patient get orthotics.  Patient is never had orthotics before.  Patient otherwise has no other concerns at this time.  Physical exam: Patient has notable pes planovalgus of the bilateral feet.  There is loss of the longitudinal arch bilaterally.  There is tenderness to palpation over the plantar fascia at the midline.  Minimal tenderness to palpation over the insertion over the calcaneal body.  Squeeze test is negative.  Assessment & Plan Plantar fasciitis, bilateral [M72.2]  Patient was fitted for a: standard, cushioned, semi-rigid orthotic. The orthotic was heated, placed on the orthotic stand. The patient was positioned in subtalar neutral position and 10 degrees of ankle dorsiflexion in a weight bearing stance on the heated orthotic blank After completion of molding, a stable base was applied to the orthotic blank. The blank was ground to a stable position for weight bearing. Blank: [employer] Run Base:None Posting:None  Patient was advised that orthotics worked best when placed in shoes wear a removable insole as possible.  Patient did bring Sketchers today which do not have removable insoles.  Patient understanding will try and put them in shoes that are more appropriate.  Patient was able to walk around with orthotics and current shoes and notes some improvement/relief of her pain.  Addendum:  Patient seen and examined in the office by fellow.   History, exam, plan of care were precepted with me.  Agree with findings as documented in fellow note above.  Darene Lamer, DO, CAQSM

## 2023-11-11 ENCOUNTER — Ambulatory Visit (INDEPENDENT_AMBULATORY_CARE_PROVIDER_SITE_OTHER): Payer: BC Managed Care – PPO | Admitting: Sports Medicine

## 2023-11-11 ENCOUNTER — Encounter: Payer: Self-pay | Admitting: Sports Medicine

## 2023-11-11 DIAGNOSIS — M722 Plantar fascial fibromatosis: Secondary | ICD-10-CM | POA: Diagnosis not present

## 2023-11-11 DIAGNOSIS — M79671 Pain in right foot: Secondary | ICD-10-CM

## 2023-11-11 DIAGNOSIS — Q666 Other congenital valgus deformities of feet: Secondary | ICD-10-CM | POA: Diagnosis not present

## 2023-11-11 DIAGNOSIS — M79672 Pain in left foot: Secondary | ICD-10-CM

## 2023-11-11 DIAGNOSIS — M76822 Posterior tibial tendinitis, left leg: Secondary | ICD-10-CM

## 2023-11-11 NOTE — Progress Notes (Signed)
[name] - 57 y.o. female MRN [id number]  Date of birth: [birth year]  Office Visit Note: Visit Date: 11/11/2023 PCP: Sandford Craze, NP Referred by: Sandford Craze, NP  Subjective: Chief Complaint  Patient presents with   Right Foot - Follow-up   Left Foot - Follow-up   HPI: [name] is a pleasant 57 y.o. female who presents today for bilateral foot/ankle pain with plantar fasciitis and pes planovalgus.   Last visit on 10/28/23 we did perform ECSWT - feeling definitely better now after 2nd treatment. Since last visit, she did have an appointment for Custom orthotics at [facility] on 11/01/23 - getting used to these and tolerating them longer.  She did pick up her Celebrex medication and has been taking it over the last week and does notice good improvement in all of her joint pain with this.   Pertinent ROS were reviewed with the patient and found to be negative unless otherwise specified above in HPI.   Assessment & Plan: Visit Diagnoses:  1. Bilateral foot pain   2. Plantar fasciitis   3. Pes planovalgus   4. Insufficiency of left posterior tibial tendon    Plan: [name] has made vast improvement in her bilateral foot pain and concomitant plantar fasciitis with a combination of extracorporeal shockwave therapy, longitudinal arch support with her custom orthotics and NSAID use.  At this point, she feels like she is at least 90% improved.  We did repeat treatment with extracorporeal shockwave therapy today.  Given her vast improvement we will have this be the final treatment at this time and she will see over the next few weeks the cumulative benefit she receives from this as well as continuing in her custom orthotics.  I would like her to work on slowly weaning her Celebrex from twice daily to 100 mg once daily then as needed as her pain improves.  We will see over the next few weeks to 1 month the degree of improvement, if for some reason she has a recurrence of her  pain or plateaus she may call me and we may consider additional shockwave treatments at others soft tissue modalities.  Otherwise follow-up as needed.  Happy birthday!  Follow-up: Return if symptoms worsen or fail to improve, for 3-4 weeks as needed.   Meds & Orders: No orders of the defined types were placed in this encounter.  No orders of the defined types were placed in this encounter.    Procedures: Procedure: ECSWT Indications:  Plantar fasciitis, arch pain, PT tendon dysfunction   Procedure Details Consent: Risks of procedure as well as the alternatives and risks of each were explained to the patient.  Verbal consent for procedure obtained. Time Out: Verified patient identification, verified procedure, site was marked, verified correct patient position. The area was cleaned with alcohol swab.     The right plantar fascia and foot arch was targeted for Extracorporeal shockwave therapy.    Preset: Plantar fasciitis Power Level: 90 mJ   Frequency: 11 Hz Impulse/cycles: 2200 Head size: Regular   Patient tolerated procedure well without immediate complications. ________________________________________________________   The left plantar fascia and posterior tibial tendon was targeted for Extracorporeal shockwave therapy.    Preset: Plantar fasciitis Power Level: 90 mJ   Frequency: 11 Hz Impulse/cycles: 2500 (500 at PT tendon) Head size: Regular   Patient tolerated procedure well without immediate complications.       Clinical History: No specialty comments available.  She reports that she has never  smoked. She has been exposed to tobacco smoke. She has never used smokeless tobacco.  Recent Labs    12/31/22 1228 05/03/23 1758 08/30/23 0906  HGBA1C 6.4 6.1 6.6*    Objective:   Vital Signs: LMP 06/22/2018   Physical Exam  Gen: Well-appearing, in no acute distress; non-toxic CV: Well-perfused. Warm.  Resp: Breathing unlabored on room air; no wheezing. Psych:  Fluid speech in conversation; appropriate affect; normal thought process Neuro: Sensation intact throughout. No gross coordination deficits.   Ortho Exam -Bilateral feet/ankles: There is notable pes planovalgus with loss of longitudinal arch, her gait is corrected when Xu with orthotics and shoes.  There is only trivial TTP over the posterior tendon on the left side.  There is trace pedal edema bilaterally.  Imaging: No results found.  Past Medical/Family/Surgical/Social History: Medications & Allergies reviewed per EMR, new medications updated. 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   History of sinus surgery 07/31/2021   Nasal polyps    Hyperlipidemia    Clotting disorder (HCC)    Allergy    Severe persistent asthma 03/24/2021   Steroid dependence (HCC) 04/01/2020   Diabetes type 2, controlled (HCC)    Chronic venous insufficiency 09/16/2016   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   Uterine leiomyoma 05/25/2016   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   Rectal bleeding 10/20/2015   Obesity 04/27/2015   Abnormal EKG 03/19/2015   Preventative health care 02/17/2015   OCD (obsessive compulsive disorder) 01/14/2015   Essential hypertension 01/13/2015   Peripheral edema 06/17/2012   NASAL POLYP 01/01/2008   Seasonal and perennial allergic rhinitis 01/01/2008   Moderate persistent asthma 01/01/2008   DEEP VENOUS THROMBOPHLEBITIS, HX OF 01/01/2008   History of DVT (deep vein thrombosis) 2005   Past  Medical History:  Diagnosis Date   Allergic rhinitis, cause unspecified    Allergy    Arthritis    left knee   Asthma    hx of severe asthma w/ copd, follows w/ Dr. [name], pulmonologist and PCP (Per 08/30/23 note from [name], NP, asthma is much improved on Dupixent and Trellegy.) As of 08/30/23, per patient, last asthma exacerbation in early 05/2023 when patient went to [place]. Patient states her asthma is greatly improved on Dupixent injections and that she rarely uses prednisone.   Clotting disorder (HCC)    DVT 2005   Complication of anesthesia    Diabetes type 2, controlled (HCC)    no meds, controlled with diet and exercise   History of DVT (deep vein thrombosis) 2005   Pt reports blood clot below knee ?unsure which leg.   Hyperlipidemia    Nasal polyps    Osteopenia    PONV (postoperative nausea and vomiting)    Unspecified essential hypertension    Follows w/ PCP.   Wears glasses    Family History  Problem Relation Age of Onset   Cancer Mother 52       ?carcinoid tumor (had surgical removal)   Diabetes Mother        borderline  Stomach cancer Mother    Liver cancer Mother    Heart disease Father        died 64, from MI, had hx of ETOH abuse and liver disease   Thyroid cancer Sister        (diagnosed at 56) half sister (same dad)    Diabetes type II Brother    Kidney disease Neg Hx    Hypertension Neg Hx    Hyperlipidemia Neg Hx    Colon cancer Neg Hx    Esophageal cancer Neg Hx    Rectal cancer Neg Hx    Colon polyps Neg Hx    Past Surgical History:  Procedure Laterality Date   COLONOSCOPY     02/05/21 & 12/12/18hx of colon polyps   CYSTOSCOPY N/A 09/14/2023   Procedure: CYSTOSCOPY;  Surgeon: Carrington Clamp, MD;  Location: [facility];  Service: Gynecology;  Laterality: N/A;   DILATION AND CURETTAGE OF UTERUS  2017   removed fibroids   LASIK Bilateral 2008   NASAL SINUS SURGERY  2008   Dr Lazarus Salines   OVARIAN CYST REMOVAL   02/2016   pt reported-- Dr [name]   POLYPECTOMY     ROBOTIC ASSISTED TOTAL HYSTERECTOMY WITH BILATERAL SALPINGO OOPHERECTOMY Bilateral 09/14/2023   Procedure: XI ROBOTIC ASSISTED TOTAL HYSTERECTOMY WITH BILATERAL SALPINGECTOMY;  Surgeon: Carrington Clamp, MD;  Location: [facility];  Service: Gynecology;  Laterality: Bilateral;   UTERINE FIBROID SURGERY  2005   Social History   Occupational History   Occupation: Production designer, [employer]  Tobacco Use   Smoking status: Never    Passive exposure: Past   Smokeless tobacco: Never  Vaping Use   Vaping status: Never Used  Substance and Sexual Activity   Alcohol use: Not Currently   Drug use: No   Sexual activity: Yes    Birth control/protection: None, Post-menopausal

## 2023-11-11 NOTE — Progress Notes (Signed)
Patient says that she is feeling a bit better. She has had the orthotics for about one week now. She says that she has had to take them out of the shoe as they hurt her feet after awhile, but as the week has progressed she has been able to keep them in for longer amounts of time, including a full day at work. She says that she has been taking the Celebrex for a few days.

## 2023-11-20 NOTE — Progress Notes (Signed)
Patient ID: [name], female    DOB: August 28, 1966, 57 y.o.   MRN: [id number] F followed for moderate persistent asthma with COPD (requiring frequent steroids , Failed Xolair, , hx nasal polyps, allergic rhinitis, hx DVT PFT- 09/09/11- FEV1 1.64/ 54%, FEV1/FVC 0.54, FEF25-75% 0.74/ 22%, insignif response to bronchodilator TLC 0.74, DLCO 72%. Moderate obstructive disease, mild restriction, mild reduction of DLCO. Office spirometry- mild obstructive airways disease. FEV1 1.95/73%, FVC 3.01/93%, FEV1/FVC 0.65, FEF 25-75% 1.26/38%. PFT 11/22/23- moderately severe obstruction with response to bronchodilator, over-inflation/ airtrapping, Normal DLCO  -----------------------------------------------------------------    11/18/22- 57 year old female never smoker followed for severe persistent Asthma with COPD (frequent steroids, failed Xolair), Nasal Polyps/ Samter's Triad/Aspirin Allergy, allergic Rhinitis, history DVT, DM2,  -Albuterol HFA, neb albuterol/ atrovent, EpiPen, Trelegy100,  prednisone 10 mg, Dupixent, Zyrtec,  Flonase, nasal saline,  Covid vax-  5 Moderna Flu vax-had -----Pt has recently come off prednisone and states she is aching all over We had a detailed discussion of adrenal insufficiency, steroid withdrawal, medical concerns and associated issues with asthma management.  She says she has had no prednisone in 3 months but wants to keep it available.  Dupixent has been a big help in controlling her asthma.  She has had aching after stopping the prednisone.  This was evaluated including testing negative for rheumatoid arthritis and lupus.  We will check a random cortisol level and update chest x-ray and PFT.  11/22/23- 57 year old female never smoker followed for severe persistent Asthma with COPD (frequent steroids, failed Xolair), Nasal Polyps/ Samter's Triad/Aspirin Allergy, allergic Rhinitis, history DVT, DM2,  -Albuterol HFA, Neb albuterol/ atrovent, EpiPen, Trelegy100,   prednisone 10 mg, Dupixent, Zyrtec,  Flonase, nasal saline,  Dupixent refilled to last through end of 2024. "Loves" Dupixent which has markedly reduced need for prednisone, even through episodes in past year of Covid and Flu. PFT 11/22/23- moderately severe obstruction with response to bronchodilator, over-inflation/ airtrapping, Normal DLCO CXR 11/20/22- IMPRESSION: No active cardiopulmonary disease.  Review of Systems-See HPI     + = positive Constitutional:   No weight loss, night sweats,  Fevers, chills, fatigue, lassitude. HEENT:   No headaches,  Difficulty swallowing,  Tooth/dental problems,  Sore throat,                No sneezing, itching, ear ache,, + post nasal drip. + nasal congestion. CV:  No chest pain,  Orthopnea, PND, swelling in lower extremities, anasarca, dizziness, palpitations GI  No heartburn, indigestion, abdominal pain, nausea, vomiting, Resp:Usually some shortness of breath with exertion, not at rest.  No excess mucus,  productive cough,          + non-productive cough,  No coughing up of blood.   change in color of mucus.   +wheezing.   Skin: no rash or lesions. GU: . MS:  + joint pain, + swelling.   Neuro- nothing unusual Psych:  No change in mood or affect. No depression or anxiety.  No memory loss.  Objective:   Physical Exam General- Alert, Oriented, Affect-appropriate, Distress- none acute  Relaxed and conversational    + overweight,  Skin- rash-none, lesions- none, excoriation- none Lymphadenopathy- none Head- atraumatic            Eyes- Gross vision intact, PERRLA, conjunctivae clear secretions            Ears- Hearing, canals normal            Nose- +Turbinate edema, No-Septal dev, mucus, , erosion, perforation. No definite  polyps             Throat- Mallampati III-IV, mucosa -not red , drainage- none, tonsils- atrophic Neck- flexible , trachea midline, no stridor , thyroid nl, carotid no bruit Chest - symmetrical excursion , unlabored            Heart/CV- RRR , no murmur , no gallop  , no rub, nl s1 s2                           - JVD- none , edema- none , stasis changes- none, varices- none           Lung-  Clear/ diminished, wheeze- none,  unlabored, cough- none , dullness-none, rub- none           Chest wall-  Abd-  Br/ Gen/ Rectal- Not done, not indicated Extrem- cyanosis- none, clubbing, none, atrophy- none, strength- nl.  Neuro- grossly intact to observation           11/18/28- 57 year old female never smoker followed for severe persistent Asthma with COPD (frequent steroids, failed Xolair), Nasal Polyps/ Samter's Triad/Aspirin Allergy, allergic Rhinitis, history DVT, DM2,  -Albuterol HFA, neb albuterol/ atrovent, EpiPen, Trelegy100,  prednisone 10 mg, Dupixent, Zyrtec,  Flonase, nasal saline,  Covid vax-  5 Moderna Flu vax-had -----Pt has recently come off prednisone and states she is aching all over We had a detailed discussion of adrenal insufficiency, steroid withdrawal, medical concerns and associated issues with asthma management.  She says she has had no prednisone in 3 months but wants to keep it available.  Dupixent has been a big help in controlling her asthma.  She has had aching after stopping the prednisone.  This was evaluated including testing negative for rheumatoid arthritis and lupus.  We will check a random cortisol level and update chest x-ray and PFT.   Review of Systems-See HPI     + = positive Constitutional:   No weight loss, night sweats,  Fevers, chills, fatigue, lassitude. HEENT:   No headaches,  Difficulty swallowing,  Tooth/dental problems,  Sore throat,                No sneezing, itching, ear ache,, + post nasal drip. + nasal congestion. CV:  No chest pain,  Orthopnea, PND, swelling in lower extremities, anasarca, dizziness, palpitations GI  No heartburn, indigestion, abdominal pain, nausea, vomiting, Resp:Usually some shortness of breath with exertion, not at rest.  No excess mucus,   productive cough,          + non-productive cough,  No coughing up of blood.   change in color of mucus.   +wheezing.   Skin: no rash or lesions. GU: . MS:  + joint pain, + swelling.   Neuro- nothing unusual Psych:  No change in mood or affect. No depression or anxiety.  No memory loss.  Objective:   Physical Exam General- Alert, Oriented, Affect-appropriate, Distress- none acute  Relaxed and conversational    + overweight, + pink cheeks/ steroid facies Skin- rash-none, lesions- none, excoriation- none Lymphadenopathy- none Head- atraumatic            Eyes- Gross vision intact, PERRLA, conjunctivae clear secretions            Ears- Hearing, canals normal            Nose- +Turbinate edema, No-Septal dev, mucus, , erosion, perforation. No definite polyps  Throat- Mallampati III-IV, mucosa -not red , drainage- none, tonsils- atrophic Neck- flexible , trachea midline, no stridor , thyroid nl, carotid no bruit Chest - symmetrical excursion , unlabored           Heart/CV- RRR , no murmur , no gallop  , no rub, nl s1 s2                           - JVD- none , edema- none , stasis changes- none, varices- none           Lung-  Clear/ diminished, wheeze- none,  unlabored, cough- none , dullness-none, rub- none           Chest wall-  Abd-  Br/ Gen/ Rectal- Not done, not indicated Extrem- cyanosis- none, clubbing, none, atrophy- none, strength- nl.  Neuro- grossly intact to observation

## 2023-11-22 ENCOUNTER — Ambulatory Visit (INDEPENDENT_AMBULATORY_CARE_PROVIDER_SITE_OTHER): Payer: BC Managed Care – PPO | Admitting: Internal Medicine

## 2023-11-22 ENCOUNTER — Encounter: Payer: Self-pay | Admitting: Internal Medicine

## 2023-11-22 ENCOUNTER — Telehealth: Payer: Self-pay | Admitting: Pharmacist

## 2023-11-22 VITALS — BP 136/74 | HR 88 | Temp 98.1°F | Ht 67.0 in | Wt 198.0 lb

## 2023-11-22 DIAGNOSIS — E119 Type 2 diabetes mellitus without complications: Secondary | ICD-10-CM

## 2023-11-22 DIAGNOSIS — J4550 Severe persistent asthma, uncomplicated: Secondary | ICD-10-CM | POA: Diagnosis not present

## 2023-11-22 DIAGNOSIS — J4489 Other specified chronic obstructive pulmonary disease: Secondary | ICD-10-CM | POA: Diagnosis not present

## 2023-11-22 DIAGNOSIS — J339 Nasal polyp, unspecified: Secondary | ICD-10-CM

## 2023-11-22 MED ORDER — ALBUTEROL SULFATE HFA 108 (90 BASE) MCG/ACT IN AERS: INHALATION_SPRAY | RESPIRATORY_TRACT | 11 refills | Status: DC

## 2023-11-22 MED ORDER — DUPIXENT 300 MG/2ML ~~LOC~~ SOAJ: SUBCUTANEOUS | 3 refills | Status: DC

## 2023-11-22 MED ORDER — TRELEGY ELLIPTA 100-62.5-25 MCG/ACT IN AEPB: 1.0000 | INHALATION_SPRAY | Freq: Every day | RESPIRATORY_TRACT | Status: DC

## 2023-11-22 NOTE — Telephone Encounter (Signed)
Patient provided with Dupixent 300mg /22mL pen injector sample. She will inject tomorrow. Patient advised to leave medication at room temperature  NDC: [phone] Lot: [id number] Exp: 03/19/2025  Chesley Mires, PharmD, MPH, BCPS, CPP Clinical Pharmacist (Rheumatology and Pulmonology)

## 2023-11-22 NOTE — Progress Notes (Signed)
Full PFT performed today. °

## 2023-11-22 NOTE — Patient Instructions (Signed)
Order- Pt will miss a Dupixent dose tomorrow unless pharmacy team can give her a sample  Order- Refill script done for Dupixent. Check to see if pharmacy needs [place] to fill out ay forms.  Script sent refilling albuterol hfa inhaler

## 2023-11-22 NOTE — Patient Instructions (Signed)
Full PFT performed today. °

## 2023-11-23 LAB — PULMONARY FUNCTION TEST
DL/VA % pred: 103 %
DL/VA: 4.29 ml/min/mmHg/L
DLCO cor % pred: 84 %
DLCO cor: 19.22 ml/min/mmHg
DLCO unc % pred: 84 %
DLCO unc: 19.22 ml/min/mmHg
FEF 25-75 Post: 2.12 L/s
FEF 25-75 Pre: 1.02 L/s
FEF2575-%Change-Post: 106 %
FEF2575-%Pred-Post: 78 %
FEF2575-%Pred-Pre: 38 %
FEV1-%Change-Post: 28 %
FEV1-%Pred-Post: 75 %
FEV1-%Pred-Pre: 59 %
FEV1-Post: 2.23 L
FEV1-Pre: 1.74 L
FEV1FVC-%Change-Post: 12 %
FEV1FVC-%Pred-Pre: 81 %
FEV6-%Change-Post: 14 %
FEV6-%Pred-Post: 84 %
FEV6-%Pred-Pre: 73 %
FEV6-Post: 3.1 L
FEV6-Pre: 2.7 L
FEV6FVC-%Change-Post: 0 %
FEV6FVC-%Pred-Post: 103 %
FEV6FVC-%Pred-Pre: 102 %
FVC-%Change-Post: 14 %
FVC-%Pred-Post: 81 %
FVC-%Pred-Pre: 71 %
FVC-Post: 3.1 L
FVC-Pre: 2.71 L
Post FEV1/FVC ratio: 72 %
Post FEV6/FVC ratio: 100 %
Pre FEV1/FVC ratio: 64 %
Pre FEV6/FVC Ratio: 99 %
RV % pred: 196 %
RV: 4.04 L
TLC % pred: 127 %
TLC: 7 L

## 2023-11-24 ENCOUNTER — Other Ambulatory Visit: Payer: Self-pay

## 2023-11-24 MED ORDER — ALBUTEROL SULFATE HFA 108 (90 BASE) MCG/ACT IN AERS: 2.0000 | INHALATION_SPRAY | Freq: Four times a day (QID) | RESPIRATORY_TRACT | 12 refills | Status: DC | PRN

## 2023-11-24 NOTE — Telephone Encounter (Signed)
Per [facility] pharmacy, Ventolin not covered by patients insurance.  Needs to be changed to Proair [facility] generic per fax request from [facility] [phone] in [facility].  Sent in new rx with same sig from [facility] 11/22/2023 by Dr. Maple Hudson.

## 2023-11-29 ENCOUNTER — Other Ambulatory Visit: Payer: Self-pay | Admitting: Family

## 2023-12-06 ENCOUNTER — Ambulatory Visit (INDEPENDENT_AMBULATORY_CARE_PROVIDER_SITE_OTHER): Payer: BC Managed Care – PPO | Admitting: Family

## 2023-12-06 VITALS — BP 142/80 | HR 104 | Temp 98.7°F | Resp 16 | Ht 67.0 in | Wt 198.0 lb

## 2023-12-06 DIAGNOSIS — M1990 Unspecified osteoarthritis, unspecified site: Secondary | ICD-10-CM

## 2023-12-06 DIAGNOSIS — I10 Essential (primary) hypertension: Secondary | ICD-10-CM

## 2023-12-06 DIAGNOSIS — M79604 Pain in right leg: Secondary | ICD-10-CM | POA: Diagnosis not present

## 2023-12-06 DIAGNOSIS — E119 Type 2 diabetes mellitus without complications: Secondary | ICD-10-CM

## 2023-12-06 DIAGNOSIS — M722 Plantar fascial fibromatosis: Secondary | ICD-10-CM | POA: Diagnosis not present

## 2023-12-06 DIAGNOSIS — M79605 Pain in left leg: Secondary | ICD-10-CM

## 2023-12-06 NOTE — Progress Notes (Unsigned)
Subjective:     Patient ID: [name], female    DOB: 1966/04/13, 57 y.o.   MRN: [id number]  Chief Complaint  Patient presents with   Diabetes    Here for follow up   Joint Pain    Complains of joint pain    HPI  Discussed the use of AI scribe software for clinical note transcription with the patient, who gave verbal consent to proceed.  History of Present Illness   The patient, with a history of hysterectomy, arthritis, and plantar fasciitis, presents for a routine follow-up. She reports ongoing joint pain that is affecting her quality of life. The pain is present in her legs, feet, arms, and shoulders. She has been receiving injections for her knee pain, which have helped with the knee pain, but the other joint pain persists. The patient also reports that she has plantar fasciitis, which causes foot pain. She has been taking Celebrex for inflammation, but reports no improvement in her pain. The patient also mentions occasional neck pain, which is not severe but is noticeable. She expresses concern about the impact of the pain on her daily life and is seeking further treatment options.          Health Maintenance Due  Topic Date Due   OPHTHALMOLOGY EXAM  11/06/2021   COVID-19 Vaccine (7 - 2024-25 season) 11/15/2023    Past Medical History:  Diagnosis Date   Allergic rhinitis, cause unspecified    Allergy    Arthritis    left knee   Asthma    hx of severe asthma w/ copd, follows w/ Dr. [name], pulmonologist and PCP (Per 08/30/23 note from [name], NP, asthma is much improved on Dupixent and Trellegy.) As of 08/30/23, per patient, last asthma exacerbation in early 05/2023 when patient went to [place]. Patient states her asthma is greatly improved on Dupixent injections and that she rarely uses prednisone.   Clotting disorder (HCC)    DVT 2005   Complication of anesthesia    Diabetes type 2, controlled (HCC)    no meds, controlled with diet and exercise    History of DVT (deep vein thrombosis) 2005   Pt reports blood clot below knee ?unsure which leg.   Hyperlipidemia    Nasal polyps    Osteopenia    PONV (postoperative nausea and vomiting)    Unspecified essential hypertension    Follows w/ PCP.   Wears glasses     Past Surgical History:  Procedure Laterality Date   COLONOSCOPY     02/05/21 & 12/12/18hx of colon polyps   CYSTOSCOPY N/A 09/14/2023   Procedure: CYSTOSCOPY;  Surgeon: Carrington Clamp, MD;  Location: [facility];  Service: Gynecology;  Laterality: N/A;   DILATION AND CURETTAGE OF UTERUS  2017   removed fibroids   LASIK Bilateral 2008   NASAL SINUS SURGERY  2008   Dr Lazarus Salines   OVARIAN CYST REMOVAL  02/2016   pt reported-- Dr [name]   POLYPECTOMY     ROBOTIC ASSISTED TOTAL HYSTERECTOMY WITH BILATERAL SALPINGO OOPHERECTOMY Bilateral 09/14/2023   Procedure: XI ROBOTIC ASSISTED TOTAL HYSTERECTOMY WITH BILATERAL SALPINGECTOMY;  Surgeon: Carrington Clamp, MD;  Location: [facility];  Service: Gynecology;  Laterality: Bilateral;   UTERINE FIBROID SURGERY  2005    Family History  Problem Relation Age of Onset   Cancer Mother 37       ?carcinoid tumor (had surgical removal)   Diabetes Mother  borderline   Stomach cancer Mother    Liver cancer Mother    Heart disease Father        died 53, from MI, had hx of ETOH abuse and liver disease   Thyroid cancer Sister        (diagnosed at 61) half sister (same dad)    Diabetes type II Brother    Kidney disease Neg Hx    Hypertension Neg Hx    Hyperlipidemia Neg Hx    Colon cancer Neg Hx    Esophageal cancer Neg Hx    Rectal cancer Neg Hx    Colon polyps Neg Hx     Social History   Socioeconomic History   Marital status: Married    Spouse name: Not on file   Number of children: Not on file   Years of education: Not on file   Highest education level: Not on file  Occupational History   Occupation: Production designer, 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     Social Drivers of Corporate investment banker Strain: Not on file  Food Insecurity: Not on file  Transportation Needs: Not on file  Physical Activity: Not on file  Stress: Not on file  Social Connections: Not on file  Intimate Partner Violence: Not on file    Outpatient Medications Prior to Visit  Medication Sig Dispense Refill   albuterol (PROAIR HFA) 108 (90 Base) MCG/ACT inhaler Inhale 2 puffs into the lungs every 6 (six) hours as needed for wheezing or shortness of breath. 18 g 12   albuterol (PROVENTIL) (2.5 MG/3ML) 0.083% nebulizer solution Take 2.5 mg by nebulization every 4 (four) hours as needed for wheezing or shortness of breath.     albuterol (VENTOLIN HFA) 108 (90 Base) MCG/ACT inhaler INHALE 2 PUFFS BY MOUTH EVERY 6 HOURS AS NEEDED FOR WHEEZING FOR SHORTNESS OF BREATH 27 g 11   Calcium Carbonate-Vit D-Min (CALTRATE 600+D PLUS MINERALS) 600-800 MG-UNIT CHEW Chew 1 tablet by mouth in the morning and at bedtime. 60 tablet    celecoxib (CELEBREX) 100 MG capsule Take 1 capsule (100 mg total) by mouth 2 (two) times daily. 30 capsule 1   cetirizine (ZYRTEC) 10 MG tablet Take 10 mg by mouth daily.     Cyanocobalamin (B-12) 2500 MCG SUBL Place under the tongue.     Dupilumab (DUPIXENT) 300 MG/2ML SOAJ INJECT 1 PEN SUBCUTANEOUSLY EVERY 14 DAYS 12 mL 3   fluticasone (FLONASE) 50 MCG/ACT nasal spray 2 sprays each nostril daily 16 g 12   Fluticasone-Umeclidin-Vilant (TRELEGY ELLIPTA)  100-62.5-25 MCG/ACT AEPB Inhale 1 puff into the lungs daily. (Patient taking differently: Inhale 1 puff into the lungs daily as needed.) 60 each 11   Fluticasone-Umeclidin-Vilant (TRELEGY ELLIPTA) 100-62.5-25 MCG/ACT AEPB Inhale 1 puff into the lungs daily.     lisinopril (ZESTRIL) 10 MG tablet Take 1.5 tablets (15 mg total) by mouth daily. 135  tablet 0   Multiple Vitamin (MULTIVITAMIN WITH MINERALS) TABS tablet Take 1 tablet by mouth daily. For 50 over.     predniSONE (STERAPRED UNI-PAK 21 TAB) 10 MG (21) TBPK tablet Take as directed on package 21 tablet 0   DULoxetine (CYMBALTA) 30 MG capsule Take 1 capsule by mouth once daily (Patient not taking: Reported on 12/06/2023) 30 capsule 3   No facility-administered medications prior to visit.    Allergies  Allergen Reactions   Aspirin Other (See Comments)    wheezing   Latex Hives   Shellfish Allergy Swelling   Shellfish-Derived Products Other (See Comments)    Anaphylactic reaction   Penicillins Rash    ROS     Objective:    Physical Exam Constitutional:      General: She is not in acute distress.    Appearance: Normal appearance. She is well-developed.  HENT:     Head: Normocephalic and atraumatic.     Right Ear: External ear normal.     Left Ear: External ear normal.  Eyes:     General: No scleral icterus. Neck:     Thyroid: No thyromegaly.  Cardiovascular:     Rate and Rhythm: Normal rate and regular rhythm.     Heart sounds: Normal heart sounds. No murmur heard. Pulmonary:     Effort: Pulmonary effort is normal. No respiratory distress.     Breath sounds: Normal breath sounds. No wheezing.  Musculoskeletal:     Cervical back: Neck supple.  Skin:    General: Skin is warm and dry.  Neurological:     Mental Status: She is alert and oriented to person, place, and time.  Psychiatric:        Mood and Affect: Mood normal.        Behavior: Behavior normal.        Thought Content: Thought content normal.         Judgment: Judgment normal.     BP (!) 142/80   Pulse (!) 104   Temp 98.7 F (37.1 C) (Oral)   Resp 16   Ht 5\' 7"  (1.702 m)   Wt 198 lb (89.8 kg)   LMP 06/22/2018   SpO2 99%   BMI 31.01 kg/m  Wt Readings from Last 3 Encounters:  12/06/23 198 lb (89.8 kg)  11/22/23 198 lb (89.8 kg)  11/01/23 192 lb (87.1 kg)       Assessment & Plan:   Problem List Items Addressed This Visit       Unprioritized   Plantar fasciitis    Ongoing foot pain, treatments received. -Continue current treatments.       Osteoarthritis    Persistent joint pain affecting quality of life. Mild arthritis in left knee diagnosed by Dr. Dimple Casey. Pain in legs, arms, shoulders, and occasional neck pain. No autoimmune cause identified. Current treatment with celecoxib 100mg  BID not providing relief. -Refer to pain management for further evaluation and treatment options. -Order arterial ultrasound to assess leg circulation.       Essential hypertension    Blood pressure measured at 130/75, patient currently on Lisinopril 10mg  daily. -Continue Lisinopril 10mg  daily.      Diabetes type 2, controlled (HCC)    Last A1C was 6.6, controlled with diet. -Check A1C today.      Relevant Orders   HgB A1c (Completed)   Basic Metabolic Panel (BMET) (Completed)   Other Visit Diagnoses       Bilateral leg pain    -  Primary   Relevant Orders   VAS [place] ABI WITH/WO TBI   Ambulatory referral to Pain Clinic       General Health Maintenance -Annual eye exam scheduled for later this week. -Follow-up in three months. I am having Tsuruko [name] Sides maintain her cetirizine, albuterol, fluticasone, Trelegy Ellipta, Caltrate 600+D Plus Minerals, DULoxetine, multivitamin with minerals, B-12, predniSONE, celecoxib, albuterol, Dupixent, Trelegy Ellipta, albuterol, and lisinopril.  No orders of the defined types were placed in this encounter.

## 2023-12-07 ENCOUNTER — Encounter: Payer: Self-pay | Admitting: Family

## 2023-12-07 LAB — BASIC METABOLIC PANEL
BUN: 17 mg/dL (ref 6–23)
CO2: 24 meq/L (ref 19–32)
Calcium: 9.1 mg/dL (ref 8.4–10.5)
Chloride: 108 meq/L (ref 96–112)
Creatinine, Ser: 0.78 mg/dL (ref 0.40–1.20)
GFR: 85.12 mL/min (ref >60.00)
Glucose, Bld: 125 mg/dL — ABNORMAL HIGH (ref 70–99)
Potassium: 3.6 meq/L (ref 3.5–5.1)
Sodium: 141 meq/L (ref 135–145)

## 2023-12-07 LAB — HEMOGLOBIN A1C: Hgb A1c MFr Bld: 6.3 % (ref 4.6–6.5)

## 2023-12-08 ENCOUNTER — Ambulatory Visit (HOSPITAL_COMMUNITY)
Admission: RE | Admit: 2023-12-08 | Discharge: 2023-12-08 | Disposition: A | Discharge status: Home / Self Care | Payer: BC Managed Care – PPO | Source: Ambulatory Visit | Attending: Family | Admitting: Family

## 2023-12-08 DIAGNOSIS — M79605 Pain in left leg: Secondary | ICD-10-CM | POA: Diagnosis present

## 2023-12-08 DIAGNOSIS — M79604 Pain in right leg: Secondary | ICD-10-CM | POA: Insufficient documentation

## 2023-12-08 LAB — VAS US ABI WITH/WO TBI
Left ABI: 1.18
Right ABI: 1.03

## 2023-12-08 LAB — HM DIABETES EYE EXAM

## 2023-12-08 NOTE — Assessment & Plan Note (Signed)
  Blood pressure measured at 130/75, patient currently on Lisinopril 10mg  daily. -Continue Lisinopril 10mg  daily.

## 2023-12-08 NOTE — Assessment & Plan Note (Signed)
  Last A1C was 6.6, controlled with diet. -Check A1C today.

## 2023-12-08 NOTE — Assessment & Plan Note (Signed)
  Persistent joint pain affecting quality of life. Mild arthritis in left knee diagnosed by Dr. Dimple Casey. Pain in legs, arms, shoulders, and occasional neck pain. No autoimmune cause identified. Current treatment with celecoxib 100mg  BID not providing relief. -Refer to pain management for further evaluation and treatment options. -Order arterial ultrasound to assess leg circulation.

## 2023-12-08 NOTE — Assessment & Plan Note (Signed)
  Ongoing foot pain, treatments received. -Continue current treatments.

## 2023-12-12 ENCOUNTER — Telehealth: Payer: Self-pay | Admitting: Internal Medicine

## 2023-12-12 NOTE — Telephone Encounter (Signed)
Tried calling the pt and there was no answer- unfortunately, her voicemail was full so unable to leave her a msg  Rerouting to triage as urgent

## 2023-12-12 NOTE — Telephone Encounter (Signed)
PT not feeling well and would like something called in. Runny nose, cough, yellow phlegm, sinus congestion. Denies fever  Pharm is [facility], [place] [address]  Her # is [phone]

## 2023-12-15 ENCOUNTER — Other Ambulatory Visit: Payer: Self-pay

## 2023-12-15 MED ORDER — AZITHROMYCIN 250 MG PO TABS
ORAL_TABLET | ORAL | 0 refills | Status: DC
Start: 2023-12-15 — End: 2023-12-15

## 2023-12-15 MED ORDER — AZITHROMYCIN 250 MG PO TABS: ORAL_TABLET | ORAL | 0 refills | Status: DC

## 2023-12-15 NOTE — Telephone Encounter (Signed)
Called and spoke with pt, has a cough and still feels stuffy. Has been using otc meds for her cough, using inhaler. Please advise    Fyi triage pt is at work if she does not answer

## 2023-12-15 NOTE — Telephone Encounter (Signed)
Called the pt and there was no answer- LMTCB    

## 2023-12-16 NOTE — Telephone Encounter (Signed)
NFN 

## 2023-12-19 ENCOUNTER — Telehealth: Payer: Self-pay | Admitting: Family

## 2023-12-19 ENCOUNTER — Encounter: Payer: Self-pay | Admitting: Family

## 2023-12-19 NOTE — Telephone Encounter (Signed)
Copied from CRM [id number]. Topic: Referral - Question >> Dec 19, 2023  4:31 PM Adaysia C wrote: Reason for CRM: Patient called in to request the name of the pain managament doctor her provider referred her too. I did not see the pain managment providers information in the office visit notes. Please follow up with patient to answer her questions about the pain managment referral spoken about in her last office visit [phone].

## 2023-12-20 ENCOUNTER — Telehealth: Payer: Self-pay | Admitting: Family

## 2023-12-20 NOTE — Telephone Encounter (Signed)
Could you let me know the name and address for the pain referral

## 2023-12-20 NOTE — Telephone Encounter (Signed)
Message sent by mychart with information

## 2023-12-20 NOTE — Telephone Encounter (Signed)
Please call eye care at [facility] on [address] and request DM eye exam.

## 2023-12-22 NOTE — Telephone Encounter (Signed)
 Request sent for DM eye exam

## 2023-12-28 ENCOUNTER — Encounter: Payer: Self-pay | Admitting: Physical Medicine & Rehabilitation

## 2023-12-29 ENCOUNTER — Encounter: Payer: Self-pay | Admitting: Physical Medicine & Rehabilitation

## 2023-12-29 ENCOUNTER — Encounter
Payer: BC Managed Care – PPO | Attending: Physical Medicine & Rehabilitation | Admitting: Physical Medicine & Rehabilitation

## 2023-12-29 VITALS — BP 110/75 | HR 81 | Ht 67.0 in

## 2023-12-29 DIAGNOSIS — I872 Venous insufficiency (chronic) (peripheral): Secondary | ICD-10-CM

## 2023-12-29 DIAGNOSIS — M79604 Pain in right leg: Secondary | ICD-10-CM | POA: Diagnosis present

## 2023-12-29 DIAGNOSIS — M79605 Pain in left leg: Secondary | ICD-10-CM | POA: Diagnosis present

## 2023-12-29 DIAGNOSIS — M1712 Unilateral primary osteoarthritis, left knee: Secondary | ICD-10-CM

## 2023-12-29 DIAGNOSIS — E66812 Obesity, class 2: Secondary | ICD-10-CM | POA: Insufficient documentation

## 2023-12-29 DIAGNOSIS — M542 Cervicalgia: Secondary | ICD-10-CM | POA: Diagnosis present

## 2023-12-29 MED ORDER — GABAPENTIN 100 MG PO CAPS: 100.0000 mg | ORAL_CAPSULE | Freq: Three times a day (TID) | ORAL | 4 refills | Status: DC

## 2023-12-29 NOTE — Progress Notes (Signed)
 Subjective:    Patient ID: [name], female    DOB: 1966/11/23, 58 y.o.   MRN: [id number]  HPI  HPI   [name] is a 58 y.o. year old female  who  has a past medical history of Allergic rhinitis, cause unspecified, Allergy , Arthritis, Asthma, Clotting disorder (HCC), Complication of anesthesia, Diabetes type 2, controlled (HCC), History of DVT (deep vein thrombosis) (2005), Hyperlipidemia, Nasal polyps, Osteopenia, PONV (postoperative nausea and vomiting), Unspecified essential hypertension, and Wears glasses.   They are presenting to PM&R clinic as a new patient for pain management evaluation. They were referred by NP Eleanor Ponto for treatment of bilateral leg pain.  Patient reports persistent sharp pains in her bilateral legs, and arms to a lesser degree.  Pain began after she discontinued use of chronic steroids for treatment of asthma.  She reports alternative nonsteroid asthma medications are keeping him asthma well-controlled thus she was able to stop oral steroids after many years. Pain is present all the time, sometimes is is improved with activity.  Pain is sharp in quality.  She will get tingling in her feet sometimes.  Pain often gets worse at night.   She has been followed by rheumatology Dr. Jeannetta for polyarthralgia and positive ANA.    She has also been diagnosed with plantar fasciitis and left knee OA.  She reports she had gel injections in her left knee that helped her left knee pain.  She reports her knee pain is overall under control at this time.   She reports she had vascular studies for PVD and these were negative.  Patient has pill bottles of Celebrex  and Cymbalta  and reports these were not very beneficial to her pain.  She reports plantar fasciitis is doing better with shoe inserts and shockwave treatment.  She works at [employer] job where she does a lot of standing.  Denies depression but reports mood was decreased after she lost her mom in  2022.  Reports she has trouble falling asleep but says she sleeps okay overall.  She denies any pain in her lower back.  Reports occasional anterior neck pain.     Red flag symptoms: No red flags for back pain endorsed in Hx or ROS  Medications tried: Topical medications- Voltaren gel- helps a little  Nsaids Celebrex - doesn't help Tylenol - Helps a little  Opiates denies Gabapentin  / Lyrica  denied but later chart review indicated use of Lyrica  TCAs denies SNRIs  - cymbalta  does not help at all   Other treatments: PT/OT  -treatment for plantar fasciiits beginning of 2024 TENs unit - has not tried for pain  Injections - L knee gel- helped knee pain  Surgery- hysterectomy    Prior UDS results: No results found for: LABOPIA, COCAINSCRNUR, LABBENZ, AMPHETMU, THCU, LABBARB     Pain Inventory Average Pain 8 Pain Right Now 6 My pain is aching  In the last 24 hours, has pain interfered with the following? General activity 5 Relation with others 0 Enjoyment of life 4 What TIME of day is your pain at its worst? varies Sleep (in general) Fair  Pain is worse with: walking, bending, standing, and some activites Pain improves with: rest and medication Relief from Meds: 7  walk without assistance how many minutes can you walk? unlimited ability to climb steps?  yes do you drive?  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  no  Primary care Eleanor Ponto    Family History  Problem Relation Age of Onset   Cancer Mother 46       ?carcinoid tumor (had surgical removal)   Diabetes Mother        borderline   Stomach cancer Mother    Liver cancer Mother    Heart disease Father        died 75, from MI, had hx of ETOH abuse and liver disease   Thyroid  cancer Sister        (diagnosed at 53) half sister (same dad)    Diabetes type II Brother    Kidney disease Neg Hx     Hypertension Neg Hx    Hyperlipidemia Neg Hx    Colon cancer Neg Hx    Esophageal cancer Neg Hx    Rectal cancer Neg Hx    Colon polyps Neg Hx    Social History   Socioeconomic History   Marital status: Married    Spouse name: Not on file   Number of children: Not on file   Years of education: Not on file   Highest education level: Not on file  Occupational History   Occupation: Production Designer, [employer]  Tobacco Use   Smoking status: Never    Passive exposure: Past   Smokeless tobacco: Never  Vaping Use   Vaping status: Never Used  Substance and Sexual Activity   Alcohol use: Not Currently   Drug use: No   Sexual activity: Yes    Birth control/protection: None, Post-menopausal  Other Topics Concern   Not on file  Social History Narrative   Married   No children   No pets   Works at [employer] as a Scientist, Water [employer], shopping, 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     Social Drivers of Corporate Investment Banker Strain: Not on file  Food Insecurity: Not on file  Transportation Needs: Not on file  Physical Activity: Not on file  Stress: Not on file  Social Connections: Not on file   Past Surgical History:  Procedure Laterality Date   COLONOSCOPY     02/05/21 & 12/12/18hx of colon polyps   CYSTOSCOPY N/A 09/14/2023   Procedure: CYSTOSCOPY;  Surgeon: Sarrah Browning, MD;  Location: [facility];  Service: Gynecology;  Laterality: N/A;   DILATION AND CURETTAGE OF UTERUS  2017   removed fibroids   LASIK Bilateral 2008   NASAL SINUS SURGERY  2008   Dr Arlana   OVARIAN CYST REMOVAL  02/2016   pt reported-- Dr Sarrah   POLYPECTOMY     ROBOTIC ASSISTED TOTAL HYSTERECTOMY WITH BILATERAL SALPINGO OOPHERECTOMY Bilateral 09/14/2023   Procedure: XI ROBOTIC ASSISTED TOTAL HYSTERECTOMY WITH BILATERAL SALPINGECTOMY;  Surgeon: Sarrah Browning, MD;  Location: [facility];  Service: Gynecology;  Laterality:  Bilateral;   UTERINE FIBROID SURGERY  2005   Past Medical History:  Diagnosis Date   Allergic rhinitis, cause unspecified    Allergy     Arthritis    left knee   Asthma    hx of severe asthma w/ copd, follows w/ Dr. Reggy Salt, pulmonologist and PCP (Per 08/30/23 note from Eleanor Archer, NP, asthma is much improved on Dupixent  and Trellegy.) As of 08/30/23, per patient, last asthma exacerbation in early 05/2023 when patient went to [place] . Patient states her asthma is greatly improved on Dupixent  injections and that she rarely uses  prednisone .   Clotting disorder (HCC)    DVT 2005   Complication of anesthesia    Diabetes type 2, controlled (HCC)    no meds, controlled with diet and exercise   History of DVT (deep vein thrombosis) 2005   Pt reports blood clot below knee ?unsure which leg.   Hyperlipidemia    Nasal polyps    Osteopenia    PONV (postoperative nausea and vomiting)    Unspecified essential hypertension    Follows w/ PCP.   Wears glasses    BP 110/75   Pulse 81   Ht 5' 7 (1.702 m)   LMP 06/22/2018   SpO2 99%   BMI 31.01 kg/m   Opioid Risk Score:   Fall Risk Score:  `1  Depression screen [facility] 2/9     12/29/2023    2:28 PM 12/06/2023    5:54 PM 09/20/2022   10:25 AM 04/30/2022    4:34 PM 11/07/2020    2:29 PM 12/07/2016    3:54 PM  Depression screen PHQ 2/9  Decreased Interest 0 0 0 0 0 0  Down, Depressed, Hopeless 0 0 0 0 0 0  PHQ - 2 Score 0 0 0 0 0 0  Altered sleeping 0 0   1   Tired, decreased energy 0 0   1   Change in appetite 0 0   0   Feeling bad or failure about yourself  0 0   0   Trouble concentrating 0 0   0   Moving slowly or fidgety/restless 0 0   0   Suicidal thoughts 0 0   0   PHQ-9 Score 0 0   2   Difficult doing work/chores  Not difficult at all   Not difficult at all      Review of Systems  Musculoskeletal:        Bilateral leg pain  All other systems reviewed and are negative.      Objective:   Physical Exam   Gen: no  distress, normal appearing HEENT: oral mucosa pink and moist, NCAT Chest: normal effort, normal rate of breathing Abd: soft, non-distended Ext: Mild leg edema greater than left side Psych: pleasant, normal affect Skin: intact Neuro: Alert and awake, follows commands, cranial nerves II through XII grossly intact, normal speech and language RUE: 5/5 Deltoid, 5/5 Biceps, 5/5 Triceps, 5/5 Wrist Ext, 5/5 Grip LUE: 5/5 Deltoid, 5/5 Biceps, 5/5 Triceps, 5/5 Wrist Ext, 5/5 Grip RLE: HF 5/5, KE 5/5, ADF 5/5, APF 5/5 LLE: HF 5/5, KE 5/5, ADF 5/5, APF 5/5 Sensory exam normal for light touch and pain in all 4 limbs. No limb ataxia or cerebellar signs. No abnormal tone appreciated.  Sensation to vibration intact at b/l 1st MTP No abnormal tone noted Musculoskeletal:  TTP over b/l MCP of her hands TTP b/l LE throughout legs mostly below her knees TTP b/l planter feet SLR neg FABER and FADIR negative Spurling's test negative Did not observe allodynia in her legs No significant L-spine or C-spine tenderness to palpation today   Knee MRI Left 06/13/23 IMPRESSION: 1. Complex degenerative tear at the junction of the anterior horn and body of the lateral meniscus. The anterior horn is markedly diminutive compatible with degenerative maceration. 2. Osteoarthritis about the knee appears worst in the lateral compartment. 3. Pes anserine bursitis. 4. Fluid anterior to the medial head of gastrocnemius may be due to a ganglion cyst or bursitis.      Assessment & Plan:  1) Chronic bilateral leg pain with less severe pain in bilateral distal arms/hand -Unclear cause of her diffuse lower leg pain  -Suspect chronic venous insufficiency may be large component -She does report tingling quality pain in her feet. Sensation intact overall however may still be neuropathic component to her pain.  2) Plantar fasciitis, she reports this is improved  -Has been treated with shockwave therapy, custom orthotics  with improvement 3) Left knee OA  -improved with viscosupplementation 4) History of DVT.  Previously seen by vascular felt to have deep venous reflux in the left. 5) Anterior neck pain.  -She denies lower back pain but I did not see this on chart review in different providers note  -Will start gabapentin  100 mg 3 times daily.  After visit on chart review appears she may have tried Lyrica  in the past. -Discussed using compression stockings daily.  She has not been using this recently.  Discussed leg elevation -Discussed foods that can be helpful for pain -Could consider electrodiagnostic study to help evaluate for neuropathy -Discussed trying TENS unit -She reports minimal benefit with Cymbalta /NSAIDs -Continue follow-up with rheumatology as directed.  Last note from Dr. Jeannetta indicate considering restarting low-dose systemic steroids. -Could consider x-ray of her hands to evaluate for features of OA.

## 2024-01-07 ENCOUNTER — Encounter: Payer: Self-pay | Admitting: Internal Medicine

## 2024-01-07 NOTE — Assessment & Plan Note (Signed)
Significant reversible and fixed obstruction with clear benefit from Dupixent which has reduced need for steroids. Plan- refill albuterol and Dupixent

## 2024-01-07 NOTE — Assessment & Plan Note (Signed)
Hopefully better control since Dupixent has reduced need for prednisone.

## 2024-02-09 ENCOUNTER — Ambulatory Visit: Payer: BC Managed Care – PPO | Admitting: Sports Medicine

## 2024-02-24 ENCOUNTER — Ambulatory Visit: Payer: BC Managed Care – PPO | Admitting: Sports Medicine

## 2024-02-24 ENCOUNTER — Encounter: Payer: Self-pay | Admitting: Sports Medicine

## 2024-02-24 DIAGNOSIS — Q666 Other congenital valgus deformities of feet: Secondary | ICD-10-CM

## 2024-02-24 DIAGNOSIS — M722 Plantar fascial fibromatosis: Secondary | ICD-10-CM

## 2024-02-24 DIAGNOSIS — M79671 Pain in right foot: Secondary | ICD-10-CM | POA: Diagnosis not present

## 2024-02-24 DIAGNOSIS — M79672 Pain in left foot: Secondary | ICD-10-CM

## 2024-02-24 DIAGNOSIS — M76822 Posterior tibial tendinitis, left leg: Secondary | ICD-10-CM | POA: Diagnosis not present

## 2024-02-24 MED ORDER — PREDNISONE 20 MG PO TABS: 20.0000 mg | ORAL_TABLET | Freq: Every day | ORAL | 0 refills | Status: AC

## 2024-02-24 NOTE — Progress Notes (Signed)
 Patient says that she was doing well for awhile but the pain in her feet has returned. She says that she still wears her custom orthotics and supportive shoes. She states that her pain is worse when first getting up in the mornings, or at the end of a long shift where she is on concrete all day. She is not currently taking pain medication aside from Tylenol as needed.

## 2024-02-24 NOTE — Progress Notes (Signed)
 [name] - 58 y.o. female MRN [id number]  Date of birth: [birth year]  Office Visit Note: Visit Date: 02/24/2024 PCP: Sandford Craze, NP Referred by: Sandford Craze, NP  Subjective: Chief Complaint  Patient presents with   Right Foot - Pain   Left Foot - Pain   HPI: [name] is a pleasant 58 y.o. female who presents today for chronic bilateral foot pain with plantar fasciitis and pes planovalgus.  Back in November of last year I saw her for these condition and we did perform a few treatments of extracorporeal shockwave therapy as well as had her fitted for custom orthotics which significantly improved her pain.  She has been doing quite well but then the last few weeks her pain has been reexacerbated.  For work she does have to stand on concrete all day which exacerbates her symptoms.  She is using Tylenol as needed.  She has pain in the right heel and the left heel as well as the left medial ankle.  She does wear her orthotics and shoes that fit and has been diligent about purchasing and wearing supportive shoe wear.  She has tried Celebrex and other anti-inflammatories in the past but this was not helpful.  She is seeing a pain management doctor and did try gabapentin but this side effect so she is stopping.  Pertinent ROS were reviewed with the patient and found to be negative unless otherwise specified above in HPI.   Assessment & Plan: Visit Diagnoses:  1. Bilateral foot pain   2. Plantar fasciitis   3. 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  Her left posterior tibial tendon does seem to be bothering her slightly as well.  Through shared decision making, we did proceed with extracorporeal shockwave therapy for bilateral plantar fascia in the left posterior tibial tendon, patient tolerated  well.  Stressed the importance of maintaining in her orthotics to help support her arch.  Given the degree of pain and inflammation from her exacerbation, we will start her on a low-dose of prednisone 20 mg for 1 week only.  I would like to see her back in the next 1-2 weeks for repeat shockwave and further evaluation.  Follow-up: Return for f/u 1-2 weeks for bilateral feet.   Meds & Orders:  Meds ordered this encounter  Medications   predniSONE (DELTASONE) 20 MG tablet    Sig: Take 1 tablet (20 mg total) by mouth daily with breakfast for 7 days.    Dispense:  7 tablet    Refill:  0   No orders of the defined types were placed in this encounter.    Procedures: Procedure: ECSWT Indications:  Plantar fasciitis, arch pain, PT tendon dysfunction   Procedure Details Consent: Risks of procedure as well as the alternatives and risks of each were explained to the patient.  Verbal consent for procedure obtained. Time Out: Verified patient identification, verified procedure, site was marked, verified correct patient position. The area was cleaned with alcohol swab.     The right plantar fascia and foot arch was targeted for Extracorporeal shockwave therapy.    Preset: Plantar fasciitis Power Level: 90 mJ     Frequency: 11 Hz Impulse/cycles: 2200 Head size: Regular   Patient tolerated procedure well without immediate complications. ________________________________________________________   The left plantar fascia and posterior tibial  tendon was targeted for Extracorporeal shockwave therapy.    Preset: Plantar fasciitis Power Level: 90 mJ   Frequency: 11 Hz Impulse/cycles: 2200 (and additional 500 at PT tendon) Head size: Regular   Patient tolerated procedure well without immediate complications.      Clinical History: No specialty comments available.  She reports that she has never smoked. She has been exposed to tobacco smoke. She has never used smokeless tobacco.  Recent Labs     05/03/23 1758 08/30/23 0906 12/06/23 1818  HGBA1C 6.1 6.6* 6.3    Objective:   Vital Signs: LMP 06/22/2018   Physical Exam  Gen: Well-appearing, in no acute distress; non-toxic CV: Well-perfused. Warm.  Resp: Breathing unlabored on room air; no wheezing. Psych: Fluid speech in conversation; appropriate affect; normal thought process  Ortho Exam - Bilateral feet: There is quite notable pes planovalgus with loss of longitudinal arch upon standing.  Positive TTP over the medial band of the plantar fascia, right greater than left.  There is also some tenderness near the course of the left posterior tibial tendon just posterior and superior to the medial malleolus.  No bony TTP.   Imaging: No results found.  Past Medical/Family/Surgical/Social History: Medications & Allergies reviewed per EMR, new medications updated. 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   History of sinus surgery 07/31/2021   Nasal polyps    Hyperlipidemia    Clotting disorder (HCC)    Allergy    Severe persistent asthma 03/24/2021   Steroid dependence (HCC) 04/01/2020   Diabetes type 2, controlled (HCC)    Chronic venous insufficiency 09/16/2016   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   Uterine leiomyoma 05/25/2016   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   Rectal bleeding 10/20/2015   Obesity 04/27/2015   Abnormal EKG 03/19/2015   Preventative health care 02/17/2015   OCD (obsessive compulsive disorder) 01/14/2015    Essential hypertension 01/13/2015   Peripheral edema 06/17/2012   NASAL POLYP 01/01/2008   Seasonal and perennial allergic rhinitis 01/01/2008   Moderate persistent asthma 01/01/2008   DEEP VENOUS THROMBOPHLEBITIS, HX OF 01/01/2008   History of DVT (deep vein thrombosis) 2005   Past Medical History:  Diagnosis Date   Allergic rhinitis, cause unspecified    Allergy    Arthritis    left knee   Asthma    hx of severe asthma w/ copd, follows w/ Dr. [name], pulmonologist and PCP (Per 08/30/23 note from [name], NP, asthma is much improved on Dupixent and Trellegy.) As of 08/30/23, per patient, last asthma exacerbation in early 05/2023 when patient went to [place]. Patient states her asthma is greatly improved on Dupixent injections and that she rarely uses prednisone.   Clotting disorder (HCC)    DVT 2005   Complication of anesthesia    Diabetes type 2, controlled (HCC)    no meds, controlled with diet and exercise   History of DVT (deep vein thrombosis) 2005   Pt reports blood clot below knee ?unsure which leg.   Hyperlipidemia    Nasal polyps  Osteopenia    PONV (postoperative nausea and vomiting)    Unspecified essential hypertension    Follows w/ PCP.   Wears glasses    Family History  Problem Relation Age of Onset   Cancer Mother 41       ?carcinoid tumor (had surgical removal)   Diabetes Mother        borderline   Stomach cancer Mother    Liver cancer Mother    Heart disease Father        died 67, from MI, had hx of ETOH abuse and liver disease   Thyroid cancer Sister        (diagnosed at 3) half sister (same dad)    Diabetes type II Brother    Kidney disease Neg Hx    Hypertension Neg Hx    Hyperlipidemia Neg Hx    Colon cancer Neg Hx    Esophageal cancer Neg Hx    Rectal cancer Neg Hx    Colon polyps Neg Hx    Past Surgical History:  Procedure Laterality Date   COLONOSCOPY     02/05/21 & 12/12/18hx of colon polyps   CYSTOSCOPY N/A 09/14/2023    Procedure: CYSTOSCOPY;  Surgeon: Carrington Clamp, MD;  Location: [facility];  Service: Gynecology;  Laterality: N/A;   DILATION AND CURETTAGE OF UTERUS  2017   removed fibroids   LASIK Bilateral 2008   NASAL SINUS SURGERY  2008   Dr Lazarus Salines   OVARIAN CYST REMOVAL  02/2016   pt reported-- Dr [name]   POLYPECTOMY     ROBOTIC ASSISTED TOTAL HYSTERECTOMY WITH BILATERAL SALPINGO OOPHERECTOMY Bilateral 09/14/2023   Procedure: XI ROBOTIC ASSISTED TOTAL HYSTERECTOMY WITH BILATERAL SALPINGECTOMY;  Surgeon: Carrington Clamp, MD;  Location: [facility];  Service: Gynecology;  Laterality: Bilateral;   UTERINE FIBROID SURGERY  2005   Social History   Occupational History   Occupation: Production designer, [employer]  Tobacco Use   Smoking status: Never    Passive exposure: Past   Smokeless tobacco: Never  Vaping Use   Vaping status: Never Used  Substance and Sexual Activity   Alcohol use: Not Currently   Drug use: No   Sexual activity: Yes    Birth control/protection: None, Post-menopausal

## 2024-03-01 ENCOUNTER — Telehealth: Payer: Self-pay | Admitting: Family

## 2024-03-01 NOTE — Telephone Encounter (Signed)
Information abstracted

## 2024-03-01 NOTE — Telephone Encounter (Signed)
 Pt dropped off a copy of her eye exam for pcp.

## 2024-03-06 ENCOUNTER — Ambulatory Visit: Payer: BC Managed Care – PPO | Admitting: Family

## 2024-03-06 ENCOUNTER — Ambulatory Visit: Admitting: Sports Medicine

## 2024-03-08 ENCOUNTER — Encounter
Payer: BC Managed Care – PPO | Attending: Physical Medicine & Rehabilitation | Admitting: Physical Medicine & Rehabilitation

## 2024-03-08 ENCOUNTER — Encounter: Payer: Self-pay | Admitting: Physical Medicine & Rehabilitation

## 2024-03-08 VITALS — BP 116/81 | HR 66 | Ht 67.0 in | Wt 192.0 lb

## 2024-03-08 DIAGNOSIS — M79604 Pain in right leg: Secondary | ICD-10-CM | POA: Diagnosis not present

## 2024-03-08 DIAGNOSIS — M542 Cervicalgia: Secondary | ICD-10-CM | POA: Insufficient documentation

## 2024-03-08 DIAGNOSIS — E66812 Obesity, class 2: Secondary | ICD-10-CM | POA: Insufficient documentation

## 2024-03-08 DIAGNOSIS — M79605 Pain in left leg: Secondary | ICD-10-CM | POA: Diagnosis present

## 2024-03-08 DIAGNOSIS — I872 Venous insufficiency (chronic) (peripheral): Secondary | ICD-10-CM | POA: Diagnosis not present

## 2024-03-08 DIAGNOSIS — M1712 Unilateral primary osteoarthritis, left knee: Secondary | ICD-10-CM | POA: Insufficient documentation

## 2024-03-08 NOTE — Progress Notes (Signed)
 Subjective:    Patient ID: [name], female    DOB: [birth year], 58 y.o.   MRN: [id number]  HPI  HPI   [name] is a 58 y.o. year old female  who  has a past medical history of Allergic rhinitis, cause unspecified, Allergy, Arthritis, Asthma, Clotting disorder (HCC), Complication of anesthesia, Diabetes type 2, controlled (HCC), History of DVT (deep vein thrombosis) (2005), Hyperlipidemia, Nasal polyps, Osteopenia, PONV (postoperative nausea and vomiting), Unspecified essential hypertension, and Wears glasses.   They are presenting to PM&R clinic as a new patient for pain management evaluation. They were referred by NP Sandford Craze for treatment of bilateral leg pain.  Patient reports persistent sharp pains in her bilateral legs, and arms to a lesser degree.  Pain began after she discontinued use of chronic steroids for treatment of asthma.  She reports alternative nonsteroid asthma medications are keeping him asthma well-controlled thus she was able to stop oral steroids after many years. Pain is present all the time, sometimes is is improved with activity.  Pain is sharp in quality.  She will get tingling in her feet sometimes.  Pain often gets worse at night.   She has been followed by rheumatology Dr. Dimple Casey for polyarthralgia and positive ANA.    She has also been diagnosed with plantar fasciitis and left knee OA.  She reports she had gel injections in her left knee that helped her left knee pain.  She reports her knee pain is overall under control at this time.   She reports she had vascular studies for PVD and these were negative.  Patient has pill bottles of Celebrex and Cymbalta and reports these were not very beneficial to her pain.  She reports plantar fasciitis is doing better with shoe inserts and shockwave treatment.  She works at [employer] job where she does a lot of standing.  Denies depression but reports mood was decreased after she lost her mom in  2022.  Reports she has trouble falling asleep but says she sleeps okay overall.  She denies any pain in her lower back.  Reports occasional anterior neck pain.     Red flag symptoms: No red flags for back pain endorsed in Hx or ROS  Medications tried: Topical medications- Voltaren gel- helps a little  Nsaids Celebrex- doesn't help Tylenol- Helps a little  Opiates denies Gabapentin / Lyrica denied but later chart review indicated use of Lyrica TCAs denies SNRIs  - cymbalta does not help at all   Other treatments: PT/OT  -treatment for plantar fasciiits beginning of 2024 TENs unit - has not tried for pain  Injections - L knee gel- helped knee pain  Surgery- hysterectomy    Prior UDS results: No results found for: "LABOPIA", "COCAINSCRNUR", "LABBENZ", "AMPHETMU", "THCU", "LABBARB"   Interval history 03/08/2024 Patient continues to have pain primarily in her legs left greater than right.  It is worse below her knees but also present in her lower thighs.  She was seen by sports medicine/Ortho care for foot pain, plantar fasciitis pes planovalgus and left posterior tibial tendon insufficiency, she was prescribed low-dose prednisone 20 mg daily for 1 week with improvement of her pain.  Shockwave treatment was repeated.  Patient is concerned that pain is coming back as prednisone has been discontinued.  She has about TENS unit because this device helped her friend with pain.  She is unable to tolerate gabapentin and has discontinued this medication.   Pain Inventory Average Pain  7 Pain Right Now 6 My pain is intermittent and aching  In the last 24 hours, has pain interfered with the following? General activity 5 Relation with others 0 Enjoyment of life 4 What TIME of day is your pain at its worst? varies Sleep (in general) Fair  Pain is worse with: walking, bending, standing, and some activites Pain improves with: rest, medication, and tylenol and creams Relief from Meds:  7        Family History  Problem Relation Age of Onset   Cancer Mother 38       ?carcinoid tumor (had surgical removal)   Diabetes Mother        borderline   Stomach cancer Mother    Liver cancer Mother    Heart disease Father        died 10, from MI, had hx of ETOH abuse and liver disease   Thyroid cancer Sister        (diagnosed at 28) half sister (same dad)    Diabetes type II Brother    Kidney disease Neg Hx    Hypertension Neg Hx    Hyperlipidemia Neg Hx    Colon cancer Neg Hx    Esophageal cancer Neg Hx    Rectal cancer Neg Hx    Colon polyps Neg Hx    Social History   Socioeconomic History   Marital status: Married    Spouse name: Not on file   Number of children: Not on file   Years of education: Not on file   Highest education level: Not on file  Occupational History   Occupation: Production designer, theatre/television/film [employer]  Tobacco Use   Smoking status: Never    Passive exposure: Past   Smokeless tobacco: Never  Vaping Use   Vaping status: Never Used  Substance and Sexual Activity   Alcohol use: Not Currently   Drug use: No   Sexual activity: Yes    Birth control/protection: None, Post-menopausal  Other Topics Concern   Not on file  Social History Narrative   Married   No children   No pets   Works at [employer] as a Production designer, theatre/television/film    Enjoys movies, shopping, 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     Social Drivers of Corporate investment banker Strain: Not on file  Food Insecurity: Not on file  Transportation Needs: Not on file  Physical Activity: Not on file  Stress: Not on file  Social Connections: Not on file   Past Surgical History:  Procedure Laterality Date   COLONOSCOPY     02/05/21 & 12/12/18hx of colon polyps   CYSTOSCOPY N/A 09/14/2023   Procedure: CYSTOSCOPY;  Surgeon: Carrington Clamp, MD;  Location: [facility];  Service: Gynecology;  Laterality: N/A;   DILATION AND CURETTAGE OF UTERUS  2017   removed fibroids    LASIK Bilateral 2008   NASAL SINUS SURGERY  2008   Dr Lazarus Salines   OVARIAN CYST REMOVAL  02/2016   pt reported-- Dr [name]   POLYPECTOMY     ROBOTIC ASSISTED TOTAL HYSTERECTOMY WITH BILATERAL SALPINGO OOPHERECTOMY Bilateral 09/14/2023   Procedure: XI ROBOTIC ASSISTED TOTAL HYSTERECTOMY WITH BILATERAL SALPINGECTOMY;  Surgeon: Carrington Clamp, MD;  Location: [facility];  Service: Gynecology;  Laterality: Bilateral;   UTERINE FIBROID SURGERY  2005   Past Medical History:  Diagnosis Date   Allergic rhinitis, cause unspecified    Allergy    Arthritis  left knee   Asthma    hx of severe asthma w/ copd, follows w/ Dr. [name], pulmonologist and PCP (Per 08/30/23 note from [name], NP, asthma is much improved on Dupixent and Trellegy.) As of 08/30/23, per patient, last asthma exacerbation in early 05/2023 when patient went to [place]. Patient states her asthma is greatly improved on Dupixent injections and that she rarely uses prednisone.   Clotting disorder (HCC)    DVT 2005   Complication of anesthesia    Diabetes type 2, controlled (HCC)    no meds, controlled with diet and exercise   History of DVT (deep vein thrombosis) 2005   Pt reports blood clot below knee ?unsure which leg.   Hyperlipidemia    Nasal polyps    Osteopenia    PONV (postoperative nausea and vomiting)    Unspecified essential hypertension    Follows w/ PCP.   Wears glasses    LMP 06/22/2018   Opioid Risk Score:   Fall Risk Score:  `1  Depression screen [facility] 2/9     12/29/2023    2:28 PM 12/06/2023    5:54 PM 09/20/2022   10:25 AM 04/30/2022    4:34 PM 11/07/2020    2:29 PM 12/07/2016    3:54 PM  Depression screen PHQ 2/9  Decreased Interest 0 0 0 0 0 0  Down, Depressed, Hopeless 0 0 0 0 0 0  PHQ - 2 Score 0 0 0 0 0 0  Altered sleeping 0 0   1   Tired, decreased energy 0 0   1   Change in appetite 0 0   0   Feeling bad or failure about yourself  0 0   0   Trouble  concentrating 0 0   0   Moving slowly or fidgety/restless 0 0   0   Suicidal thoughts 0 0   0   PHQ-9 Score 0 0   2   Difficult doing work/chores  Not difficult at all   Not difficult at all      Review of Systems  Musculoskeletal:        Bilateral leg pain  All other systems reviewed and are negative.      Objective:   Physical Exam   Gen: no distress, normal appearing HEENT: oral mucosa pink and moist, NCAT Chest: normal effort, normal rate of breathing Abd: soft, non-distended Ext: Mild leg edema greater than left side Psych: pleasant, normal affect Skin: intact Neuro: Alert and awake, follows commands, cranial nerves II through XII grossly intact, normal speech and language Strength intact to gravity and resistance in all 4 extremities Sensory exam normal for light touch and pain in all 4 limbs.  No abnormal tone noted Musculoskeletal:  TTP over b/l MCP of her hands TTP b/l LE throughout legs primarily in her bilateral calves TTP b/l planter feet SLR neg Did not observe allodynia in her legs No significant L-spine or C-spine tenderness to palpation again today. Mild TTP posterior to medial malleolus on the left. No significant knee joint TTP or pain with ROM, varus and valgus stress.  Knee MRI Left 06/13/23 IMPRESSION: 1. Complex degenerative tear at the junction of the anterior horn and body of the lateral meniscus. The anterior horn is markedly diminutive compatible with degenerative maceration. 2. Osteoarthritis about the knee appears worst in the lateral compartment. 3. Pes anserine bursitis. 4. Fluid anterior to the medial head of gastrocnemius may be due to a ganglion cyst or  bursitis.      Assessment & Plan:  1) Chronic bilateral leg pain with less severe pain in bilateral distal arms/hand -Unclear cause of her diffuse lower leg pain  -HX Chronic venous insufficiency may be contributing -She reports she had a test for neuropathy that was negative? 2)  Plantar fasciitis, she reports this is improved  -Has been treated with shockwave therapy, custom orthotics with improvement.  Following with sports medicine 3) Left knee OA  -improved with viscosupplementation 4) History of DVT.  Previously seen by vascular felt to have deep venous reflux in the left. 5) Anterior neck pain  -Neck pain improved today  -She denies lower back pain but I did not see this on chart review in different providers note  -DC gabapentin low-dose-patient reports she did not tolerate this well -Discussed compression stockings-patient reports she has tried without benefit -Discussed foods that can be helpful for pain -Could consider electrodiagnostic study to help evaluate for neuropathy -Will order nexwave TENS unit-if cost ends up being too high discussed option for over-the-counter TENS unit -She reports minimal benefit with Cymbalta/multiple NSAIDs/Voltaren gel in the past -Continue follow-up with rheumatology as directed.  Last note from Dr. Dimple Casey indicate considering restarting low-dose systemic steroids. -Could consider x-ray of her hands to evaluate for features of OA.   -Discussed trying magnesium supplement for pain and muscle cramps -Will order MRI of left lower extremity tibia/fibula to help evaluate for any additional causes of this persistent pain

## 2024-03-14 ENCOUNTER — Encounter: Payer: Self-pay | Admitting: Sports Medicine

## 2024-03-14 ENCOUNTER — Other Ambulatory Visit: Payer: Self-pay | Admitting: Physical Medicine & Rehabilitation

## 2024-03-14 ENCOUNTER — Ambulatory Visit: Admitting: Sports Medicine

## 2024-03-14 DIAGNOSIS — Q666 Other congenital valgus deformities of feet: Secondary | ICD-10-CM | POA: Diagnosis not present

## 2024-03-14 DIAGNOSIS — M79672 Pain in left foot: Secondary | ICD-10-CM

## 2024-03-14 DIAGNOSIS — M722 Plantar fascial fibromatosis: Secondary | ICD-10-CM

## 2024-03-14 DIAGNOSIS — M21611 Bunion of right foot: Secondary | ICD-10-CM

## 2024-03-14 DIAGNOSIS — M21619 Bunion of unspecified foot: Secondary | ICD-10-CM

## 2024-03-14 DIAGNOSIS — M79671 Pain in right foot: Secondary | ICD-10-CM

## 2024-03-14 DIAGNOSIS — M21612 Bunion of left foot: Secondary | ICD-10-CM

## 2024-03-14 DIAGNOSIS — R5381 Other malaise: Secondary | ICD-10-CM

## 2024-03-14 NOTE — Progress Notes (Signed)
 Office Visit Note   Patient: [name]           Date of Birth: [birth year]           MRN: [id number] Visit Date: 03/14/2024              Requested by: Veronica Craze, NP [address] [place],  [place] [address] PCP: Veronica Craze, NP  Medical Resident, Sports Medicine Fellow - Attending Physician Addendum:   I have independently interviewed and examined the patient myself. I have discussed the above with the original author and agree with their documentation. My edits for correction/addition/clarification have been made, see any changes above and below.   In summary, [name] has made improvement in her bilateral foot pain with concomitant plantar fasciitis after our last treatment of extracorporeal shockwave therapy.  She also found benefit from a short course of oral prednisone, she will discontinue this now is only for short-term use.  We did repeat 1 additional shockwave treatment for bilateral plantar fascia, patient tolerated well.  She will see over the next few weeks her cumulative benefit and if she wishes to proceed with additional treatments, she will let us know.  She does have bilateral bunion deformity which is moderate in severity.  I did discuss with her trialing silicone spacers for symptom relief, appropriate shoe wear.  We had a brief discussion today regarding general overview of bunionectomy and other surgical interventions that may be performed as well as expected recovery.  Discussed if she wishes to consider this, I would have her make an appointment with Dr. Lajoyce [name] in our office.  She will continue her orthotics to help support her pes planus.  She we will continue discontinuation of her gabapentin given it was not helpful.  She will follow-up with [place] as needed.  Veronica Brunner, DO Primary Care Sports Medicine Physician  [facility] - Orthopedics   Assessment & Plan: Visit Diagnoses:  1. Bilateral foot pain   2. Plantar fasciitis,  bilateral   3. Pes planovalgus   4. Bunion of great toe    Plan: Discussed with patient that given her good benefit from the oral steroid as well as shockwave, can we consider doing shockwave therapy in the coming few weeks as well - she will update [place] after improvement if desires further treatment. Patient states that she will see how she is doing considering last time she got approximately 90% improvement after 2 shockwave therapies.  Patient was also advised to continue wearing her orthotics or any shoes she feels like are helping with stability and with the plantar fasciitis.  She does have bilateral great toe bunion deformity, we discussed silicone padding for symptom relief as well as general overview of possible surgical fix for this, patient is not ready yet for surgery but she will let me know.  Discussed my partner, Dr. Lajoyce [name], does perform this procedure.  She has recently stopped her gabapentin given the little relief.  Patient is understanding and agreeable.  Patient to follow-up as needed.  Follow-Up Instructions: Return in about 3 weeks (around 04/04/2024), or if symptoms worsen or fail to improve.   Orders:  No orders of the defined types were placed in this encounter.  No orders of the defined types were placed in this encounter.    Procedures: Procedures: Procedure: ECSWT Indications:  Plantar fasciitis, arch pain, PT tendon dysfunction   Procedure Details Consent: Risks of procedure as well as the alternatives and  risks of each were explained to the patient.  Verbal consent for procedure obtained. Time Out: Verified patient identification, verified procedure, site was marked, verified correct patient position. The area was cleaned with alcohol swab.     The right plantar fascia and foot arch was targeted for Extracorporeal shockwave therapy.    Preset: Plantar fasciitis Power Level: 100 mJ   Frequency: 11 Hz Impulse/cycles: 2500 Head size: Regular   Patient tolerated  procedure well without immediate complications. ________________________________________________________   The left plantar fascia and posterior tibial tendon was targeted for Extracorporeal shockwave therapy.    Preset: Plantar fasciitis Power Level: 100 mJ   Frequency: 11 Hz Impulse/cycles: 2500  Head size: Regular   Patient tolerated procedure well without immediate complications.     Clinical Data: No additional findings.   Subjective: Chief Complaint  Patient presents with   Right Foot - Follow-up   Left Foot - Follow-up    Patient is here for follow-up of her bilateral plantar fasciitis.  Patient states that the left is worse than the right.  Patient notes about 40% improvement after the oral steroids which were given to her last time.  Patient works at [employer] and is wearing a rotation of his thick insole with running shoes.  Patient notes that she is getting better but not completely there and would like to trial some shockwave today.  Patient notes she is taking Tylenol for pain still    Review of Systems   Objective: Vital Signs: LMP 06/22/2018   Physical Exam  Ortho Exam Bilateral feet: There is quite notable pes planus and valgus deformity with noted loss of longitudinal arch upon standing.  Positive TTP over the medial band of the plantar fascia, left side is greater than right.    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   History of sinus surgery 07/31/2021   Nasal polyps    Hyperlipidemia     Clotting disorder (HCC)    Allergy    Severe persistent asthma 03/24/2021   Steroid dependence (HCC) 04/01/2020   Diabetes type 2, controlled (HCC)    Chronic venous insufficiency 09/16/2016   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   Uterine leiomyoma 05/25/2016   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   Rectal bleeding 10/20/2015   Obesity 04/27/2015   Abnormal EKG 03/19/2015   Preventative health care 02/17/2015   OCD (obsessive compulsive disorder) 01/14/2015   Essential hypertension 01/13/2015   Peripheral edema 06/17/2012   NASAL POLYP 01/01/2008   Seasonal and perennial allergic rhinitis 01/01/2008   Moderate persistent asthma 01/01/2008   DEEP VENOUS THROMBOPHLEBITIS, HX OF 01/01/2008   History of DVT (deep vein thrombosis) 2005   Past Medical History:  Diagnosis Date   Allergic rhinitis, cause unspecified    Allergy    Arthritis    left knee   Asthma    hx of severe asthma w/ copd, follows w/ Dr. [name], pulmonologist and PCP (Per 08/30/23 note from [name], NP, asthma is much improved on Dupixent and Trellegy.) As  of 08/30/23, per patient, last asthma exacerbation in early 05/2023 when patient went to [place]. Patient states her asthma is greatly improved on Dupixent injections and that she rarely uses prednisone.   Clotting disorder (HCC)    DVT 2005   Complication of anesthesia    Diabetes type 2, controlled (HCC)    no meds, controlled with diet and exercise   History of DVT (deep vein thrombosis) 2005   Pt reports blood clot below knee ?unsure which leg.   Hyperlipidemia    Nasal polyps    Osteopenia    PONV (postoperative nausea and vomiting)    Unspecified essential hypertension    Follows w/ PCP.   Wears glasses     Family History  Problem Relation Age of Onset   Cancer Mother 26       ?carcinoid tumor (had surgical removal)   Diabetes Mother        borderline   Stomach cancer Mother    Liver cancer  Mother    Heart disease Father        died 40, from MI, had hx of ETOH abuse and liver disease   Thyroid cancer Sister        (diagnosed at 26) half sister (same dad)    Diabetes type II Brother    Kidney disease Neg Hx    Hypertension Neg Hx    Hyperlipidemia Neg Hx    Colon cancer Neg Hx    Esophageal cancer Neg Hx    Rectal cancer Neg Hx    Colon polyps Neg Hx     Past Surgical History:  Procedure Laterality Date   COLONOSCOPY     02/05/21 & 12/12/18hx of colon polyps   CYSTOSCOPY N/A 09/14/2023   Procedure: CYSTOSCOPY;  Surgeon: Carrington Clamp, MD;  Location: [facility];  Service: Gynecology;  Laterality: N/A;   DILATION AND CURETTAGE OF UTERUS  2017   removed fibroids   LASIK Bilateral 2008   NASAL SINUS SURGERY  2008   Dr Lazarus Salines   OVARIAN CYST REMOVAL  02/2016   pt reported-- Dr [name]   POLYPECTOMY     ROBOTIC ASSISTED TOTAL HYSTERECTOMY WITH BILATERAL SALPINGO OOPHERECTOMY Bilateral 09/14/2023   Procedure: XI ROBOTIC ASSISTED TOTAL HYSTERECTOMY WITH BILATERAL SALPINGECTOMY;  Surgeon: Carrington Clamp, MD;  Location: [facility];  Service: Gynecology;  Laterality: Bilateral;   UTERINE FIBROID SURGERY  2005   Social History   Occupational History   Occupation: Engineer, [employer]  Tobacco Use   Smoking status: Never    Passive exposure: Past   Smokeless tobacco: Never  Vaping Use   Vaping status: Never Used  Substance and Sexual Activity   Alcohol use: Not Currently   Drug use: No   Sexual activity: Yes    Birth control/protection: None, Post-menopausal   I spent 33 minutes in the care of the patient today including face-to-face time, preparation to see the patient, as well as review of previous imaging, chart and note review from pain medicine recent visit, medication adjustment, discussion on possible surgery for bilateral bunions that may be performed by one of my orthopedic colleagues in the future for the above diagnoses.    Veronica Brunner, DO Primary Care Sports Medicine Physician  [facility] - Orthopedics  This note was dictated using Dragon naturally speaking software and may contain errors in syntax, spelling, or content which have not been identified prior to signing this note.

## 2024-03-14 NOTE — Progress Notes (Signed)
 Patient says that she is feeling a bit better. She says that the course of Prednisone did give her some relief; since being off of it some of her pain has returned but it is still overall better than last visit. She is wearing her orthotics daily and has been wearing supportive shoes. She takes Tylenol only as needed.

## 2024-03-28 ENCOUNTER — Telehealth: Payer: Self-pay | Admitting: Physical Medicine & Rehabilitation

## 2024-03-28 ENCOUNTER — Telehealth: Payer: Self-pay | Admitting: Sports Medicine

## 2024-03-28 NOTE — Telephone Encounter (Signed)
 Pt request a call back regarding her mri

## 2024-03-28 NOTE — Telephone Encounter (Signed)
 P called and stated the mri place her referral got sent to has not received referral

## 2024-03-28 NOTE — Telephone Encounter (Signed)
 Patient called. Says she does not need for Lydia to call her.

## 2024-03-30 NOTE — Telephone Encounter (Signed)
 Sorry this one has been approved--just called insurance company--but patient wants both legs done.  Do you want to put a referral in for right leg?  I know that I got the left one authorized.

## 2024-03-30 NOTE — Telephone Encounter (Signed)
 Patient called back and wants you to call her about MRI--I had put this one on your desk in red folder--you needed to do peer to peer with insurance company.  She also wants both legs done.

## 2024-04-13 ENCOUNTER — Encounter (HOSPITAL_BASED_OUTPATIENT_CLINIC_OR_DEPARTMENT_OTHER): Payer: Self-pay | Admitting: Urology

## 2024-04-13 ENCOUNTER — Other Ambulatory Visit: Payer: Self-pay

## 2024-04-13 ENCOUNTER — Emergency Department (HOSPITAL_BASED_OUTPATIENT_CLINIC_OR_DEPARTMENT_OTHER)

## 2024-04-13 ENCOUNTER — Emergency Department (HOSPITAL_BASED_OUTPATIENT_CLINIC_OR_DEPARTMENT_OTHER)
Admission: EM | Admit: 2024-04-13 | Discharge: 2024-04-13 | Disposition: A | Discharge status: Home / Self Care | Attending: Emergency Medicine | Admitting: Emergency Medicine

## 2024-04-13 ENCOUNTER — Ambulatory Visit: Payer: Self-pay

## 2024-04-13 DIAGNOSIS — I10 Essential (primary) hypertension: Secondary | ICD-10-CM | POA: Insufficient documentation

## 2024-04-13 DIAGNOSIS — X500XXA Overexertion from strenuous movement or load, initial encounter: Secondary | ICD-10-CM | POA: Diagnosis not present

## 2024-04-13 DIAGNOSIS — Y990 Civilian activity done for income or pay: Secondary | ICD-10-CM | POA: Insufficient documentation

## 2024-04-13 DIAGNOSIS — J4489 Other specified chronic obstructive pulmonary disease: Secondary | ICD-10-CM | POA: Insufficient documentation

## 2024-04-13 DIAGNOSIS — Z91040 Latex allergy status: Secondary | ICD-10-CM | POA: Diagnosis not present

## 2024-04-13 DIAGNOSIS — Z79899 Other long term (current) drug therapy: Secondary | ICD-10-CM | POA: Diagnosis not present

## 2024-04-13 DIAGNOSIS — Z7951 Long term (current) use of inhaled steroids: Secondary | ICD-10-CM | POA: Insufficient documentation

## 2024-04-13 DIAGNOSIS — R0789 Other chest pain: Secondary | ICD-10-CM | POA: Insufficient documentation

## 2024-04-13 DIAGNOSIS — R079 Chest pain, unspecified: Secondary | ICD-10-CM

## 2024-04-13 DIAGNOSIS — E119 Type 2 diabetes mellitus without complications: Secondary | ICD-10-CM | POA: Insufficient documentation

## 2024-04-13 LAB — CBC
HCT: 35.3 % — ABNORMAL LOW (ref 36.0–46.0)
Hemoglobin: 11.3 g/dL — ABNORMAL LOW (ref 12.0–15.0)
MCH: 25.3 pg — ABNORMAL LOW (ref 26.0–34.0)
MCHC: 32 g/dL (ref 30.0–36.0)
MCV: 79.1 fL — ABNORMAL LOW (ref 80.0–100.0)
Platelets: 272 10*3/uL (ref 150–400)
RBC: 4.46 MIL/uL (ref 3.87–5.11)
RDW: 13.5 % (ref 11.5–15.5)
WBC: 7 10*3/uL (ref 4.0–10.5)
nRBC: 0 % (ref 0.0–0.2)

## 2024-04-13 LAB — BASIC METABOLIC PANEL WITH GFR
Anion gap: 11 (ref 5–15)
BUN: 11 mg/dL (ref 6–20)
CO2: 23 mmol/L (ref 22–32)
Calcium: 9.5 mg/dL (ref 8.9–10.3)
Chloride: 106 mmol/L (ref 98–111)
Creatinine, Ser: 0.66 mg/dL (ref 0.44–1.00)
GFR, Estimated: >60 mL/min (ref >60)
Glucose, Bld: 89 mg/dL (ref 70–99)
Potassium: 3.5 mmol/L (ref 3.5–5.1)
Sodium: 141 mmol/L (ref 135–145)

## 2024-04-13 LAB — TROPONIN T, HIGH SENSITIVITY: Troponin T High Sensitivity: <15 ng/L (ref <19)

## 2024-04-13 MED ORDER — OMEPRAZOLE 20 MG PO CPDR: 20.0000 mg | DELAYED_RELEASE_CAPSULE | Freq: Every day | ORAL | 0 refills | Status: DC

## 2024-04-13 NOTE — ED Provider Notes (Signed)
 [facility] Provider Note   CSN: [id number] Arrival date & time: 04/13/24  1138     History  Chief Complaint  Patient presents with   Chest Pain    [name] is a 58 y.o. female.   Chest Pain Patient presents with chest pain.  Anterior chest.  No shortness of breath.  Pain began after lifting something heavy at work.  No tachycardia.  No swelling in her legs.  Not exertional.  Feels as if she has gas.    Past Medical History:  Diagnosis Date   Allergic rhinitis, cause unspecified    Allergy     Arthritis    left knee   Asthma    hx of severe asthma w/ copd, follows w/ Dr. Rosa College, pulmonologist and PCP (Per 08/30/23 note from Jorie Newness, NP, asthma is much improved on Dupixent  and Trellegy.) As of 08/30/23, per patient, last asthma exacerbation in early 05/2023 when patient went to [place] . Patient states her asthma is greatly improved on Dupixent  injections and that she rarely uses prednisone .   Clotting disorder (HCC)    DVT 2005   Complication of anesthesia    Diabetes type 2, controlled (HCC)    no meds, controlled with diet and exercise   History of DVT (deep vein thrombosis) 2005   Pt reports blood clot below knee ?unsure which leg.   Hyperlipidemia    Nasal polyps    Osteopenia    PONV (postoperative nausea and vomiting)    Unspecified essential hypertension    Follows w/ PCP.   Wears glasses     Home Medications Prior to Admission medications   Medication Sig Start Date End Date Taking? Authorizing Provider  omeprazole (PRILOSEC) 20 MG capsule Take 1 capsule (20 mg total) by mouth daily. 04/13/24  Yes Mozell Arias, MD  albuterol  (PROAIR  HFA) [phone] Base) MCG/ACT inhaler Inhale 2 puffs into the lungs every 6 (six) hours as needed for wheezing or shortness of breath. 11/24/23   Rosa College D, MD  albuterol  (PROVENTIL ) (2.5 MG/3ML) 0.083% nebulizer solution Take 2.5 mg by nebulization every 4  (four) hours as needed for wheezing or shortness of breath.    [provider]  Calcium  Carbonate-Vit D-Min (CALTRATE 600+D PLUS MINERALS) 600-800 MG-UNIT CHEW Chew 1 tablet by mouth in the morning and at bedtime. 07/01/22   O'Sullivan, Melissa, NP  celecoxib  (CELEBREX ) 100 MG capsule Take 1 capsule (100 mg total) by mouth 2 (two) times daily. Patient not taking: Reported on 03/08/2024 11/01/23   Brooks, Dana, DO  cetirizine (ZYRTEC) 10 MG tablet Take 10 mg by mouth daily.    [provider]  Cyanocobalamin (B-12) 2500 MCG SUBL Place under the tongue.    [provider]  Dupilumab  (DUPIXENT ) 300 MG/2ML SOAJ INJECT 1 PEN SUBCUTANEOUSLY EVERY 14 DAYS 11/22/23   Rosa College D, MD  fluticasone  (FLONASE ) 50 MCG/ACT nasal spray 2 sprays each nostril daily 05/18/22   Rosa College D, MD  Fluticasone -Umeclidin-Vilant (TRELEGY ELLIPTA ) 100-62.5-25 MCG/ACT AEPB Inhale 1 puff into the lungs daily. Patient taking differently: Inhale 1 puff into the lungs daily as needed. 07/01/22   Rosa College D, MD  lisinopril  (ZESTRIL ) 10 MG tablet Take 1.5 tablets (15 mg total) by mouth daily. 11/29/23   O'Sullivan, Melissa, NP  Multiple Vitamin (MULTIVITAMIN WITH MINERALS) TABS tablet Take 1 tablet by mouth daily. For 50 over.    [provider]      Allergies  Aspirin, Latex, Shellfish allergy , Shellfish-derived products, Gabapentin , and Penicillins    Review of Systems   Review of Systems  Cardiovascular:  Positive for chest pain.    Physical Exam Updated Vital Signs BP 129/82   Pulse 80   Temp 98 F (36.7 C) (Oral)   Resp 17   Ht 5\' 7"  (1.702 m)   Wt 87.1 kg   LMP 06/22/2018   SpO2 100%   BMI 30.07 kg/m  Physical Exam Vitals and nursing note reviewed.  Cardiovascular:     Rate and Rhythm: Regular rhythm.  Pulmonary:     Breath sounds: No wheezing.  Chest:     Chest wall: Tenderness present.     Comments: Mild tenderness to anterior chest  wall. Musculoskeletal:     Right lower leg: No edema.  Neurological:     Mental Status: She is alert.     ED Results / Procedures / Treatments   Labs (all labs ordered are listed, but only abnormal results are displayed) Labs Reviewed  CBC - Abnormal; Notable for the following components:      Result Value   Hemoglobin 11.3 (*)    HCT 35.3 (*)    MCV 79.1 (*)    MCH 25.3 (*)    All other components within normal limits  BASIC METABOLIC PANEL WITH GFR  TROPONIN T, HIGH SENSITIVITY    EKG EKG Interpretation Date/Time:  Friday April 13 2024 11:45:26 EDT Ventricular Rate:  83 PR Interval:  160 QRS Duration:  76 QT Interval:  356 QTC Calculation: 418 R Axis:   31  Text Interpretation: Normal sinus rhythm Normal ECG No previous ECGs available Confirmed by Mozell Arias [phone]) on 04/13/2024 2:41:38 PM  Radiology DG Chest 2 View Result Date: 04/13/2024 CLINICAL DATA:  Mid chest pain since picking up a heavy object yesterday. The pain is worse with movement. EXAM: CHEST - 2 VIEW COMPARISON:  None Available. FINDINGS: Normal sized heart. Clear lungs with normal vascularity. Stable small right diaphragmatic eventration. Minimal thoracic spine degenerative changes. Mild cervical spine degenerative changes. IMPRESSION: No acute abnormality. Electronically Signed   By: Catherin Closs M.D.   On: 04/13/2024 13:24    Procedures Procedures    Medications Ordered in ED Medications - No data to display  ED Course/ Medical Decision Making/ A&P                                 Medical Decision Making Amount and/or Complexity of Data Reviewed Labs: ordered. Radiology: ordered.  Risk Prescription drug management.   Patient with anterior chest pain.  Began after lifting something heavy.  Not exertional.  Not hypoxic.  EKG reassuring.  Troponin negative.  Pain had been going since yesterday.  Feeling better now.  Doubt cardiac ischemia.  Doubt pulmonary embolism.  Appears stable  for discharge home.  Will treat with PPI and outpatient follow-up.        Final Clinical Impression(s) / ED Diagnoses Final diagnoses:  Nonspecific chest pain    Rx / DC Orders ED Discharge Orders          Ordered    omeprazole (PRILOSEC) 20 MG capsule  Daily        04/13/24 1356              Mozell Arias, MD 04/13/24 1442

## 2024-04-13 NOTE — ED Triage Notes (Signed)
 Pt states mid chest pain that started yesterday at work after picking up heavy object  States pain didn't go away  Denies SOB  Reports pain worse with movement

## 2024-04-13 NOTE — Telephone Encounter (Signed)
 Copied from CRM [id number]. Topic: Clinical - Red Word Triage >> Apr 13, 2024  8:03 AM Lisa Rideau A wrote: [facility] that prompted transfer to Nurse Triage: Patient having "minor"chest pains   Chief Complaint: Chest Pain/Pressure Symptoms: "pressure" Frequency: since yesterday evening Pertinent Negatives: Patient denies dizziness, nausea, vomiting, sweating, fever, difficulty breathing, cough Disposition: [x] ED /[] Urgent Care (no appt availability in office) / [] Appointment(In office/virtual)/ []  [facility]/ [] Home Care/ [] Refused Recommended Disposition /[] [facility]/ []  Follow-up with PCP Additional Notes: Patient called and advised she has been having minor chest pain/pressure since yesterday evening. She states that she has never experienced this before and was concerned. She states that the pressure is in the center of the chest between her breasts and is non-radiating.  Pain comes and goes with certain movements. Patient states that she has had a history of a blood clot in one of her legs back in 2006 that she was put on a blood thinner for.  She was eventually taken off of that blood thinner.  Patient also states that she feels the pain/pressure when she takes a deep breath. Patient denies dizziness, nausea, vomiting, sweating, fever, difficulty breathing, cough. Patient is advised that at this time with her symptoms and prior medical history it is recommended that she goes to the Emergency Room for further evaluation. Patient states that she was considering doing this beforehand and is going to go get evaluated at this time. Patient verbalized understanding.  Reason for Disposition  History of prior "blood clot" in leg or lungs (i.e., deep vein thrombosis, pulmonary embolism)  Answer Assessment - Initial Assessment Questions 1. LOCATION: "Where does it hurt?"       Between breasts 2. RADIATION: "Does the pain go anywhere else?" (e.g., into neck, jaw, arms, back)      Non-radiating 3. ONSET: "When did the chest pain begin?" (Minutes, hours or days)      Since yesterday evening 4. PATTERN: "Does the pain come and go, or has it been constant since it started?"  "Does it get worse with exertion?"      Comes and goes with certain movements 5. DURATION: "How long does it last" (e.g., seconds, minutes, hours)     A few minutes 6. SEVERITY: "How bad is the pain?"  (e.g., Scale 1-10; mild, moderate, or severe)    - MILD (1-3): doesn't interfere with normal activities     - MODERATE (4-7): interferes with normal activities or awakens from sleep    - SEVERE (8-10): excruciating pain, unable to do any normal activities       5 7. CARDIAC RISK FACTORS: "Do you have any history of heart problems or risk factors for heart disease?" (e.g., angina, prior heart attack; diabetes, high blood pressure, high cholesterol, smoker, or strong family history of heart disease)     Hypertension 8. PULMONARY RISK FACTORS: "Do you have any history of lung disease?"  (e.g., blood clots in lung, asthma, emphysema, birth control pills)     Blood clot in leg 2006 9. CAUSE: "What do you think is causing the chest pain?"     unknown 10. OTHER SYMPTOMS: "Do you have any other symptoms?" (e.g., dizziness, nausea, vomiting, sweating, fever, difficulty breathing, cough)       No  Protocols used: Chest Pain-A-AH

## 2024-05-23 ENCOUNTER — Other Ambulatory Visit: Payer: Self-pay | Admitting: Family

## 2024-06-08 ENCOUNTER — Encounter: Admitting: Physical Medicine & Rehabilitation

## 2024-06-12 ENCOUNTER — Ambulatory Visit: Admitting: Family

## 2024-06-15 ENCOUNTER — Encounter: Payer: Self-pay | Admitting: Family

## 2024-06-15 ENCOUNTER — Ambulatory Visit (INDEPENDENT_AMBULATORY_CARE_PROVIDER_SITE_OTHER): Admitting: Family

## 2024-06-15 VITALS — BP 140/90 | HR 80 | Temp 99.2°F | Resp 16 | Ht 67.0 in | Wt 203.0 lb

## 2024-06-15 DIAGNOSIS — I10 Essential (primary) hypertension: Secondary | ICD-10-CM | POA: Diagnosis not present

## 2024-06-15 DIAGNOSIS — E010 Iodine-deficiency related diffuse (endemic) goiter: Secondary | ICD-10-CM

## 2024-06-15 DIAGNOSIS — Z7985 Long-term (current) use of injectable non-insulin antidiabetic drugs: Secondary | ICD-10-CM

## 2024-06-15 DIAGNOSIS — E782 Mixed hyperlipidemia: Secondary | ICD-10-CM | POA: Diagnosis not present

## 2024-06-15 DIAGNOSIS — J4550 Severe persistent asthma, uncomplicated: Secondary | ICD-10-CM

## 2024-06-15 DIAGNOSIS — E119 Type 2 diabetes mellitus without complications: Secondary | ICD-10-CM | POA: Diagnosis not present

## 2024-06-15 MED ORDER — LISINOPRIL 20 MG PO TABS: 20.0000 mg | ORAL_TABLET | Freq: Every day | ORAL | 1 refills | Status: DC

## 2024-06-15 MED ORDER — TIRZEPATIDE 2.5 MG/0.5ML ~~LOC~~ SOAJ: 2.5000 mg | SUBCUTANEOUS | 0 refills | Status: DC

## 2024-06-15 NOTE — Addendum Note (Signed)
 Addended by: DORLENE CHIQUITA RAMAN on: 06/15/2024 04:30 PM   Modules accepted: Orders

## 2024-06-15 NOTE — Assessment & Plan Note (Signed)
 Had normal thyroid  US .

## 2024-06-15 NOTE — Progress Notes (Signed)
 Subjective:     Patient ID: [name], female    DOB: [birth year], 58 y.o.   MRN: [id number]  Chief Complaint  Patient presents with   Hypertension    Here for follow up   Diabetes    Here for follow up    Hypertension  Diabetes    Discussed the use of AI scribe software for clinical note transcription with the patient, who gave verbal consent to proceed.  History of Present Illness  Patient presents today for for routine follow up.  [name] is a 58 year old female with severe persistent asthma who presents for a routine follow-up. Her severe persistent asthma is well-controlled with Dupixent . She continues to use Trelegy and albuterol  as needed. Her blood pressure is mildly elevated despite taking lisinopril  15 mg daily, and a dosage adjustment is under consideration. She experienced atypical chest pain on April 25th, with a negative ER workup, and the pain was attributed to GERD. Omeprazole  has been effective in managing her symptoms. Her diabetes is managed through diet, and she is exploring weight loss options.   Lab Results  Component Value Date   HGBA1C 6.3 12/06/2023    Lab Results  Component Value Date   CHOL 160 08/30/2023   HDL 40.80 08/30/2023   LDLCALC 88 08/30/2023   TRIG 154.0 (H) 08/30/2023   CHOLHDL 4 08/30/2023      Health Maintenance Due  Topic Date Due   Pneumococcal Vaccine 48-67 Years old (2 of 2 - PCV) 07/16/2016   COVID-19 Vaccine (7 - Moderna risk 2024-25 season) 03/20/2024   HEMOGLOBIN A1C  06/05/2024    Past Medical History:  Diagnosis Date   Allergic rhinitis, cause unspecified    Allergy     Arthritis    left knee   Asthma    hx of severe asthma w/ copd, follows w/ Dr. Reggy Salt, pulmonologist and PCP (Per 08/30/23 note from Eleanor Archer, NP, asthma is much improved on Dupixent  and Trellegy.) As of 08/30/23, per patient, last asthma exacerbation in early 05/2023 when patient went to [place] . Patient states her  asthma is greatly improved on Dupixent  injections and that she rarely uses prednisone .   Clotting disorder (HCC)    DVT 2005   Complication of anesthesia    Diabetes type 2, controlled (HCC)    no meds, controlled with diet and exercise   History of DVT (deep vein thrombosis) 2005   Pt reports blood clot below knee ?unsure which leg.   Hyperlipidemia    Nasal polyps    Osteopenia    PONV (postoperative nausea and vomiting)    Unspecified essential hypertension    Follows w/ PCP.   Wears glasses     Past Surgical History:  Procedure Laterality Date   COLONOSCOPY     02/05/21 & 12/12/18hx of colon polyps   CYSTOSCOPY N/A 09/14/2023   Procedure: CYSTOSCOPY;  Surgeon: Sarrah Browning, MD;  Location: [facility];  Service: Gynecology;  Laterality: N/A;   DILATION AND CURETTAGE OF UTERUS  2017   removed fibroids   LASIK Bilateral 2008   NASAL SINUS SURGERY  2008   Dr Arlana   OVARIAN CYST REMOVAL  02/2016   pt reported-- Dr Sarrah   POLYPECTOMY     ROBOTIC ASSISTED TOTAL HYSTERECTOMY WITH BILATERAL SALPINGO OOPHERECTOMY Bilateral 09/14/2023   Procedure: XI ROBOTIC ASSISTED TOTAL HYSTERECTOMY WITH BILATERAL SALPINGECTOMY;  Surgeon: Sarrah Browning, MD;  Location: [facility];  Service: Gynecology;  Laterality: Bilateral;   UTERINE FIBROID SURGERY  2005    Family History  Problem Relation Age of Onset   Cancer Mother 67       ?carcinoid tumor (had surgical removal)   Diabetes Mother        borderline   Stomach cancer Mother    Liver cancer Mother    Heart disease Father        died 37, from MI, had hx of ETOH abuse and liver disease   Thyroid  cancer Sister        (diagnosed at 102) half sister (same dad)    Diabetes type II Brother    Kidney disease Neg Hx    Hypertension Neg Hx    Hyperlipidemia Neg Hx    Colon cancer Neg Hx    Esophageal cancer Neg Hx    Rectal cancer Neg Hx    Colon polyps Neg Hx     Social History   Socioeconomic  History   Marital status: Married    Spouse name: Not on file   Number of children: Not on file   Years of education: Not on file   Highest education level: Not on file  Occupational History   Occupation: Production designer, theatre/television/film [employer]  Tobacco Use   Smoking status: Never    Passive exposure: Past   Smokeless tobacco: Never  Vaping Use   Vaping status: Never Used  Substance and Sexual Activity   Alcohol use: Not Currently   Drug use: No   Sexual activity: Yes    Birth control/protection: None, Post-menopausal  Other Topics Concern   Not on file  Social History Narrative   Married   No children   No pets   Works at [employer] as a Production designer, theatre/television/film    Enjoys movies, shopping, 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     Social Drivers of Corporate investment banker Strain: Not on file  Food Insecurity: Not on file  Transportation Needs: Not on file  Physical Activity: Not on file  Stress: Not on file  Social Connections: Not on file  Intimate Partner Violence: Not on file    Outpatient Medications Prior to Visit  Medication Sig Dispense Refill   albuterol  (PROAIR  HFA) 108 (90 Base) MCG/ACT inhaler Inhale 2 puffs into the lungs every 6 (six) hours as needed for wheezing or shortness of breath. 18 g 12   albuterol  (PROVENTIL ) (2.5 MG/3ML) 0.083% nebulizer solution Take 2.5 mg by nebulization every 4 (four) hours as needed for wheezing or shortness of breath.     Calcium  Carbonate-Vit D-Min (CALTRATE 600+D PLUS MINERALS) 600-800 MG-UNIT CHEW Chew 1 tablet by mouth in the morning and at bedtime. 60 tablet    cetirizine (ZYRTEC) 10 MG tablet Take 10 mg by mouth daily.     Cyanocobalamin (B-12) 2500 MCG SUBL Place under the tongue.     Dupilumab  (DUPIXENT ) 300 MG/2ML SOAJ INJECT 1 PEN SUBCUTANEOUSLY EVERY 14 DAYS 12 mL 3   fluticasone  (FLONASE ) 50 MCG/ACT nasal spray 2 sprays each nostril daily 16 g 12   Fluticasone -Umeclidin-Vilant (TRELEGY ELLIPTA ) 100-62.5-25 MCG/ACT  AEPB Inhale 1 puff into the lungs daily. (Patient taking differently: Inhale 1 puff into the lungs daily as needed.) 60 each 11   Multiple Vitamin (MULTIVITAMIN WITH MINERALS) TABS tablet Take 1 tablet by mouth daily. For 50 over.     lisinopril  (ZESTRIL ) 10 MG tablet Take 1.5 tablets (15 mg total) by mouth daily. 45 tablet  0   celecoxib  (CELEBREX ) 100 MG capsule Take 1 capsule (100 mg total) by mouth 2 (two) times daily. (Patient not taking: Reported on 03/08/2024) 30 capsule 1   omeprazole  (PRILOSEC) 20 MG capsule Take 1 capsule (20 mg total) by mouth daily. 14 capsule 0   No facility-administered medications prior to visit.    Allergies  Allergen Reactions   Aspirin Other (See Comments)    wheezing   Latex Hives   Shellfish Allergy  Swelling   Shellfish-Derived Products Other (See Comments)    Anaphylactic reaction   Gabapentin  Other (See Comments)    Stange feeling and increased appetite   Penicillins Rash and Other (See Comments)    Product containing penicillin (product)    ROS See HPI    Objective:    Physical Exam Constitutional:      General: She is not in acute distress.    Appearance: Normal appearance. She is well-developed.  HENT:     Head: Normocephalic and atraumatic.     Right Ear: External ear normal.     Left Ear: External ear normal.   Eyes:     General: No scleral icterus.  Neck:     Thyroid : No thyromegaly.   Cardiovascular:     Rate and Rhythm: Normal rate and regular rhythm.     Heart sounds: Normal heart sounds. No murmur heard. Pulmonary:     Effort: Pulmonary effort is normal. No respiratory distress.     Breath sounds: Normal breath sounds. No wheezing.   Musculoskeletal:     Cervical back: Neck supple.   Skin:    General: Skin is warm and dry.   Neurological:     Mental Status: She is alert and oriented to person, place, and time.   Psychiatric:        Mood and Affect: Mood normal.        Behavior: Behavior normal.        Thought  Content: Thought content normal.        Judgment: Judgment normal.      BP (!) 140/90   Pulse 80   Temp 99.2 F (37.3 C) (Oral)   Resp 16   Ht 5' 7 (1.702 m)   Wt 203 lb (92.1 kg)   LMP 06/22/2018   SpO2 100%   BMI 31.79 kg/m  Wt Readings from Last 3 Encounters:  06/15/24 203 lb (92.1 kg)  04/13/24 192 lb 0.3 oz (87.1 kg)  03/08/24 192 lb (87.1 kg)       Assessment & Plan:   Problem List Items Addressed This Visit       Unprioritized   Thyromegaly   Had normal thyroid  US .      Severe persistent asthma - Primary   Much better on Dupixent .  Continues Trelegy, prn albuterol .       Primary hypertension   BP Readings from Last 3 Encounters:  06/15/24 (!) 140/90  04/13/24 129/82  03/08/24 116/81   BP mildly elevated. Increase lisinopril  from 15 to 20mg .       Relevant Medications   lisinopril  (ZESTRIL ) 20 MG tablet   Hyperlipidemia   Relevant Medications   lisinopril  (ZESTRIL ) 20 MG tablet   Other Relevant Orders   Lipid panel   Diabetes type 2, controlled (HCC)   Mounjaro will be added for glycemic control and weight loss. Benefits, risks, and side effects were discussed.        Relevant Medications   tirzepatide (MOUNJARO) 2.5 MG/0.5ML Pen  lisinopril  (ZESTRIL ) 20 MG tablet   Other Relevant Orders   HgB A1c   Basic Metabolic Panel (BMET)   Urine Microalbumin w/creat. ratio    I have discontinued [name]'s omeprazole  and lisinopril . I am also having her start on tirzepatide and lisinopril . Additionally, I am having her maintain her cetirizine, albuterol , fluticasone , Trelegy Ellipta , Caltrate 600+D Plus Minerals, multivitamin with minerals, B-12, celecoxib , Dupixent , and albuterol .  Meds ordered this encounter  Medications   tirzepatide (MOUNJARO) 2.5 MG/0.5ML Pen    Sig: Inject 2.5 mg into the skin once a week.    Dispense:  2 mL    Refill:  0    Supervising Provider:   DOMENICA BLACKBIRD A [[id number]]   lisinopril  (ZESTRIL ) 20 MG tablet     Sig: Take 1 tablet (20 mg total) by mouth daily.    Dispense:  90 tablet    Refill:  1    Supervising Provider:   DOMENICA BLACKBIRD A [[id number]]

## 2024-06-15 NOTE — Assessment & Plan Note (Signed)
 BP Readings from Last 3 Encounters:  06/15/24 (!) 140/90  04/13/24 129/82  03/08/24 116/81   BP mildly elevated. Increase lisinopril  from 15 to 20mg .

## 2024-06-15 NOTE — Assessment & Plan Note (Signed)
 Much better on Dupixent .  Continues Trelegy, prn albuterol .

## 2024-06-15 NOTE — Patient Instructions (Signed)
 VISIT SUMMARY:  You had a follow-up visit to review your severe persistent asthma, chest pain, high blood pressure, and diabetes management. Your asthma remains well-controlled, and your chest pain is being managed with medication. Adjustments were made to your blood pressure and diabetes treatments.  YOUR PLAN:  SEVERE PERSISTENT ASTHMA: Your asthma is well-controlled with your current medications. -Continue using Dupixent , Trelegy, and albuterol  as needed.  ATYPICAL CHEST PAIN: Your chest pain is attributed to GERD and is being managed with omeprazole . -Continue taking omeprazole  as needed for GERD symptoms.  HYPERTENSION: Your blood pressure is mildly elevated despite current medication. -Increase Lisinopril  to 20 mg once daily.  TYPE 2 DIABETES MELLITUS: Your diabetes is currently controlled through diet, but additional medication will help with blood sugar and weight loss. -Start taking Mounjaro for better blood sugar control and weight loss.

## 2024-06-15 NOTE — Assessment & Plan Note (Signed)
 Mounjaro will be added for glycemic control and weight loss. Benefits, risks, and side effects were discussed.

## 2024-06-16 LAB — BASIC METABOLIC PANEL WITH GFR
BUN: 14 mg/dL (ref 7–25)
CO2: 24 mmol/L (ref 20–32)
Calcium: 9.1 mg/dL (ref 8.6–10.4)
Chloride: 106 mmol/L (ref 98–110)
Creat: 0.66 mg/dL (ref 0.50–1.03)
Glucose, Bld: 151 mg/dL — ABNORMAL HIGH (ref 65–99)
Potassium: 3.7 mmol/L (ref 3.5–5.3)
Sodium: 142 mmol/L (ref 135–146)
eGFR: 103 mL/min/{1.73_m2} (ref >=60)

## 2024-06-16 LAB — HEMOGLOBIN A1C
Hgb A1c MFr Bld: 6.1 % — ABNORMAL HIGH (ref <5.7)
Mean Plasma Glucose: 128 mg/dL
eAG (mmol/L): 7.1 mmol/L

## 2024-06-16 LAB — LIPID PANEL
Cholesterol: 178 mg/dL (ref <200)
HDL: 45 mg/dL — ABNORMAL LOW (ref >=50)
LDL Cholesterol (Calc): 110 mg/dL — ABNORMAL HIGH
Non-HDL Cholesterol (Calc): 133 mg/dL — ABNORMAL HIGH (ref <130)
Total CHOL/HDL Ratio: 4 (calc) (ref <5.0)
Triglycerides: 118 mg/dL (ref <150)

## 2024-06-16 LAB — SPECIMEN COMPROMISED

## 2024-06-16 LAB — MICROALBUMIN / CREATININE URINE RATIO
Creatinine, Urine: 208 mg/dL (ref 20–275)
Microalb Creat Ratio: 1 mg/g{creat} (ref <30)
Microalb, Ur: 0.3 mg/dL

## 2024-06-17 ENCOUNTER — Other Ambulatory Visit: Payer: Self-pay | Admitting: Family

## 2024-06-17 DIAGNOSIS — E785 Hyperlipidemia, unspecified: Secondary | ICD-10-CM

## 2024-06-17 NOTE — Telephone Encounter (Signed)
 Cholesterol remains above goal.  I would recommend that she add a cholesterol medication to decrease her future risk of heart attack and stroke.  Rx pended for crestor 5mg .

## 2024-06-18 ENCOUNTER — Telehealth: Payer: Self-pay

## 2024-06-18 ENCOUNTER — Other Ambulatory Visit (HOSPITAL_COMMUNITY): Payer: Self-pay

## 2024-06-18 DIAGNOSIS — J4550 Severe persistent asthma, uncomplicated: Secondary | ICD-10-CM

## 2024-06-18 NOTE — Telephone Encounter (Signed)
 Submitted a Prior Authorization RENEWAL request to [employer] for DUPIXENT  via CoverMyMeds. Will update once we receive a response.  Key: [id number]  Sherry Pennant, PharmD, MPH, BCPS, CPP Clinical Pharmacist (Rheumatology and Pulmonology)

## 2024-06-18 NOTE — Telephone Encounter (Signed)
 Pharmacy Patient Advocate Encounter  Received notification from [employer] that Prior Authorization for Mounjaro  has been APPROVED from 06/18/24 to 06/18/25. Ran test claim, Copay is $82.37. This test claim was processed through [facility]- copay amounts may vary at other pharmacies due to pharmacy/plan contracts, or as the patient moves through the different stages of their insurance plan.   PA #/Case ID/Reference #: [id number]  Sent to [facility] and is in work queue

## 2024-06-18 NOTE — Telephone Encounter (Signed)
 Pharmacy Patient Advocate Encounter   Received notification from CoverMyMeds that prior authorization for Mounjaro 2.5 is required/requested.   Insurance verification completed.   The patient is insured through [facility] .   Per test claim: PA required; PA submitted to above mentioned insurance via CoverMyMeds Key/confirmation #/EOC [id number] Status is pending

## 2024-06-18 NOTE — Telephone Encounter (Unsigned)
 Copied from CRM [id number]. Topic: Referral - Prior Authorization Question >> Jun 15, 2024 11:19 AM Celestine FALCON wrote: Reason for CRM: Maddy from [facility] calling for prior authorization check up on the pt for Dupilumab  (DUPIXENT ) 300 MG/2ML SOAJ. She also left the cover my med key [facility] and fax number [phone]. Phone number is [phone].

## 2024-06-19 ENCOUNTER — Telehealth: Payer: Self-pay

## 2024-06-19 NOTE — Telephone Encounter (Signed)
 Copied from CRM [id number]. Topic: Clinical - Lab/Test Results >> Jun 19, 2024  3:37 PM Deaijah H wrote: Reason for CRM: Patient called in to return Levorn CMA call no answer from [facility]. Did advise of note Cholesterol remains above goal.  I would recommend that she add a cholesterol medication to decrease her future risk of heart attack and stroke.  Rx pended for crestor 5mg  patient stated she still will like to speak with Levorn & that she has questions. Please call [phone]

## 2024-06-19 NOTE — Telephone Encounter (Signed)
 Called patient but no answer, left voice mail for patient to call back.

## 2024-06-20 ENCOUNTER — Telehealth: Payer: Self-pay

## 2024-06-20 ENCOUNTER — Encounter: Payer: Self-pay | Admitting: Family

## 2024-06-20 MED ORDER — TRELEGY ELLIPTA 100-62.5-25 MCG/ACT IN AEPB: 1.0000 | INHALATION_SPRAY | Freq: Every day | RESPIRATORY_TRACT | 11 refills | Status: DC

## 2024-06-20 MED ORDER — ROSUVASTATIN CALCIUM 5 MG PO TABS: 5.0000 mg | ORAL_TABLET | Freq: Every day | ORAL | 1 refills | Status: DC

## 2024-06-20 NOTE — Telephone Encounter (Signed)
 Patient notified of results, provider's comments and new rx

## 2024-06-20 NOTE — Telephone Encounter (Signed)
 Spoke to patient

## 2024-06-20 NOTE — Telephone Encounter (Signed)
 Patient notified of information and new medication

## 2024-06-20 NOTE — Telephone Encounter (Signed)
 Received a fax regarding Prior Authorization from [employer] for DUPIXENT . Authorization has been DENIED because:   1. There are paid claims or your doctor provides medical records showing you are still being treated with a medicine you breathe in to help with asthma  A. You might also be using other asthma medicines (asthma controller medication), for example: a medicine that blocks certain chemicals in your body  B. You have tried or cannot use these medicines.  Submitted an URGENT appeal to [facility] for DUPIXENT .  Case ID: [id number]  Phone: [phone]  Fax: [phone]    Sherry Pennant, PharmD, MPH, BCPS, CPP Clinical Pharmacist (Rheumatology and Pulmonology)

## 2024-06-20 NOTE — Telephone Encounter (Signed)
 Results given to patient

## 2024-06-20 NOTE — Telephone Encounter (Signed)
 Copied from CRM [id number]. Topic: General - Other >> Jun 20, 2024  8:21 AM Rosina BIRCH wrote: Reason for CRM: patient returning a call to [place] from the office CB [phone]

## 2024-06-25 ENCOUNTER — Other Ambulatory Visit (HOSPITAL_COMMUNITY): Payer: Self-pay

## 2024-06-27 ENCOUNTER — Other Ambulatory Visit (HOSPITAL_COMMUNITY): Payer: Self-pay

## 2024-07-02 ENCOUNTER — Other Ambulatory Visit (HOSPITAL_COMMUNITY): Payer: Self-pay

## 2024-07-02 NOTE — Telephone Encounter (Signed)
 Called [employer] regarding PA appeal status. Dupixent  has been approved for 06/18/2024 - 06/20/2025.   Case#: [id number] Phone: [phone]   Called patient regarding recent approval. Left voicemail advising contact with pharmacy to coordinate shipment of Dupixent  to home.  Deleta Colt PharmD Candidate [phone]  [facility]

## 2024-07-13 ENCOUNTER — Encounter: Attending: Physical Medicine & Rehabilitation | Admitting: Physical Medicine & Rehabilitation

## 2024-07-13 ENCOUNTER — Encounter: Payer: Self-pay | Admitting: Physical Medicine & Rehabilitation

## 2024-07-13 VITALS — BP 119/82 | HR 67 | Ht 67.0 in | Wt 202.0 lb

## 2024-07-13 DIAGNOSIS — M79605 Pain in left leg: Secondary | ICD-10-CM | POA: Insufficient documentation

## 2024-07-13 DIAGNOSIS — M79604 Pain in right leg: Secondary | ICD-10-CM | POA: Diagnosis not present

## 2024-07-13 DIAGNOSIS — M542 Cervicalgia: Secondary | ICD-10-CM | POA: Diagnosis present

## 2024-07-13 MED ORDER — SUZETRIGINE 50 MG PO TABS: 50.0000 mg | ORAL_TABLET | Freq: Two times a day (BID) | ORAL | 0 refills | Status: DC | PRN

## 2024-07-13 NOTE — Progress Notes (Signed)
 Subjective:    Patient ID: [name], female    DOB: [birth year], 58 y.o.   MRN: [id number]  HPI  HPI   [name] is a 58 y.o. year old female  who  has a past medical history of Allergic rhinitis, cause unspecified, Allergy , Arthritis, Asthma, Clotting disorder (HCC), Complication of anesthesia, Diabetes type 2, controlled (HCC), History of DVT (deep vein thrombosis) (2005), Hyperlipidemia, Nasal polyps, Osteopenia, PONV (postoperative nausea and vomiting), Unspecified essential hypertension, and Wears glasses.   They are presenting to PM&R clinic as a new patient for pain management evaluation. They were referred by NP Eleanor Ponto for treatment of bilateral leg pain.  Patient reports persistent sharp pains in her bilateral legs, and arms to a lesser degree.  Pain began after she discontinued use of chronic steroids for treatment of asthma.  She reports alternative nonsteroid asthma medications are keeping him asthma well-controlled thus she was able to stop oral steroids after many years. Pain is present all the time, sometimes is is improved with activity.  Pain is sharp in quality.  She will get tingling in her feet sometimes.  Pain often gets worse at night.   She has been followed by rheumatology Dr. Jeannetta for polyarthralgia and positive ANA.    She has also been diagnosed with plantar fasciitis and left knee OA.  She reports she had gel injections in her left knee that helped her left knee pain.  She reports her knee pain is overall under control at this time.   She reports she had vascular studies for PVD and these were negative.  Patient has pill bottles of Celebrex  and Cymbalta  and reports these were not very beneficial to her pain.  She reports plantar fasciitis is doing better with shoe inserts and shockwave treatment.  She works at [employer] job where she does a lot of standing.  Denies depression but reports mood was decreased after she lost her mom in  2022.  Reports she has trouble falling asleep but says she sleeps okay overall.  She denies any pain in her lower back.  Reports occasional anterior neck pain.     Red flag symptoms: No red flags for back pain endorsed in Hx or ROS  Medications tried: Topical medications- Voltaren gel- helps a little  Nsaids Celebrex - doesn't help Tylenol - Helps a little  Opiates denies Gabapentin  / Lyrica  denied but later chart review indicated use of Lyrica  TCAs denies SNRIs  - cymbalta  does not help at all   Other treatments: PT/OT  -treatment for plantar fasciiits beginning of 2024 TENs unit - has not tried for pain  Injections - L knee gel- helped knee pain  Surgery- hysterectomy    Prior UDS results: No results found for: LABOPIA, COCAINSCRNUR, LABBENZ, AMPHETMU, THCU, LABBARB   Interval history 03/08/2024 Patient continues to have pain primarily in her legs left greater than right.  It is worse below her knees but also present in her lower thighs.  She was seen by sports medicine/Ortho care for foot pain, plantar fasciitis pes planovalgus and left posterior tibial tendon insufficiency, she was prescribed low-dose prednisone  20 mg daily for 1 week with improvement of her pain.  Shockwave treatment was repeated.  Patient is concerned that pain is coming back as prednisone  has been discontinued.  She has about TENS unit because this device helped her friend with pain.  She is unable to tolerate gabapentin  and has discontinued this medication.   Interval History 07/14/24 Pain  largely unchanged from her last visit.  She continues to have pain it is worse in her left lateral lower leg.  Mild pain in her right lower leg, this side is doing okay.  She reports occasional hand pain with she feels it is safe and cramping in this area.  Neck pain is also doing okay, not bothering her too much recently. Patient has started magnesium supplement which she feels like has been helping her  pain.  Patient is going on a cruise soon, worried about pain worsening as she goes.  Sometimes activity will worsen her pain.  She has not yet completed MRI, she wants to get it scheduled.  I gave her the number to call [facility].  Pain Inventory Average Pain 6 Pain Right Now 5 My pain is intermittent and aching  In the last 24 hours, has pain interfered with the following? General activity 5 Relation with others 5 Enjoyment of life 5 What TIME of day is your pain at its worst? morning  and evening Sleep (in general) Fair  Pain is worse with: standing Pain improves with: medication Relief from Meds: 4        Family History  Problem Relation Age of Onset   Cancer Mother 14       ?carcinoid tumor (had surgical removal)   Diabetes Mother        borderline   Stomach cancer Mother    Liver cancer Mother    Heart disease Father        died 75, from MI, had hx of ETOH abuse and liver disease   Thyroid  cancer Sister        (diagnosed at 57) half sister (same dad)    Diabetes type II Brother    Kidney disease Neg Hx    Hypertension Neg Hx    Hyperlipidemia Neg Hx    Colon cancer Neg Hx    Esophageal cancer Neg Hx    Rectal cancer Neg Hx    Colon polyps Neg Hx    Social History   Socioeconomic History   Marital status: Married    Spouse name: Not on file   Number of children: Not on file   Years of education: Not on file   Highest education level: Not on file  Occupational History   Occupation: Production designer, theatre/television/film [employer]  Tobacco Use   Smoking status: Never    Passive exposure: Past   Smokeless tobacco: Never  Vaping Use   Vaping status: Never Used  Substance and Sexual Activity   Alcohol use: Not Currently   Drug use: No   Sexual activity: Yes    Birth control/protection: None, Post-menopausal  Other Topics Concern   Not on file  Social History Narrative   Married   No children   No pets   Works at [employer] as a Production designer, theatre/television/film    Enjoys movies, shopping,  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     Social Drivers of Corporate investment banker Strain: Not on file  Food Insecurity: Not on file  Transportation Needs: Not on file  Physical Activity: Not on file  Stress: Not on file  Social Connections: Not on file   Past Surgical History:  Procedure Laterality Date   COLONOSCOPY     02/05/21 & 12/12/18hx of colon polyps   CYSTOSCOPY N/A 09/14/2023   Procedure: CYSTOSCOPY;  Surgeon: Sarrah Browning, MD;  Location: [facility];  Service: Gynecology;  Laterality: N/A;   DILATION AND CURETTAGE OF UTERUS  2017   removed fibroids   LASIK Bilateral 2008   NASAL SINUS SURGERY  2008   Dr Arlana   OVARIAN CYST REMOVAL  02/2016   pt reported-- Dr Sarrah   POLYPECTOMY     ROBOTIC ASSISTED TOTAL HYSTERECTOMY WITH BILATERAL SALPINGO OOPHERECTOMY Bilateral 09/14/2023   Procedure: XI ROBOTIC ASSISTED TOTAL HYSTERECTOMY WITH BILATERAL SALPINGECTOMY;  Surgeon: Sarrah Browning, MD;  Location[facility];  Service: Gynecology;  Laterality: Bilateral;   UTERINE FIBROID SURGERY  2005   Past Medical History:  Diagnosis Date   Allergic rhinitis, cause unspecified    Allergy     Arthritis    left knee   Asthma    hx of severe asthma w/ copd, follows w/ Dr. Reggy Salt, pulmonologist and PCP (Per 08/30/23 note from Eleanor Archer, NP, asthma is much improved on Dupixent  and Trellegy.) As of 08/30/23, per patient, last asthma exacerbation in early 05/2023 when patient went to [place] . Patient states her asthma is greatly improved on Dupixent  injections and that she rarely uses prednisone .   Clotting disorder (HCC)    DVT 2005   Complication of anesthesia    Diabetes type 2, controlled (HCC)    no meds, controlled with diet and exercise   History of DVT (deep vein thrombosis) 2005   Pt reports blood clot below knee ?unsure which leg.   Hyperlipidemia    Nasal polyps    Osteopenia    PONV  (postoperative nausea and vomiting)    Unspecified essential hypertension    Follows w/ PCP.   Wears glasses    BP 119/82   Pulse 67   Ht 5' 7 (1.702 m)   Wt 202 lb (91.6 kg)   LMP 06/22/2018   SpO2 95%   BMI 31.64 kg/m   Opioid Risk Score:   Fall Risk Score:  `1  Depression screen [facility] 2/9     07/13/2024    9:26 AM 03/08/2024    3:40 PM 12/29/2023    2:28 PM 12/06/2023    5:54 PM 09/20/2022   10:25 AM 04/30/2022    4:34 PM 11/07/2020    2:29 PM  Depression screen PHQ 2/9  Decreased Interest 0 0 0 0 0 0 0  Down, Depressed, Hopeless 0 0 0 0 0 0 0  PHQ - 2 Score 0 0 0 0 0 0 0  Altered sleeping   0 0   1  Tired, decreased energy   0 0   1  Change in appetite   0 0   0  Feeling bad or failure about yourself    0 0   0  Trouble concentrating   0 0   0  Moving slowly or fidgety/restless   0 0   0  Suicidal thoughts   0 0   0  PHQ-9 Score   0 0   2  Difficult doing work/chores    Not difficult at all   Not difficult at all     Review of Systems  Musculoskeletal:        Bilateral leg pain  All other systems reviewed and are negative.      Objective:   Physical Exam   Gen: no distress, normal appearing HEENT: oral mucosa pink and moist, NCAT Chest: normal effort, normal rate of breathing Abd: soft, non-distended Ext: Mild leg edema greater than left side Psych: pleasant, normal affect Skin: intact Neuro: Alert and  awake, follows commands, cranial nerves II through XII grossly intact, normal speech and language Strength intact to gravity and resistance in all 4 extremities Sensory exam normal for light touch and pain in all 4 limbs.  No abnormal tone noted Musculoskeletal:  Tenderness to palpation today primarily in her left foot and ankle No allodynia No significant tenderness in her lumbar and cervical spine today   Prior exam TTP over b/l MCP of her hands TTP b/l LE throughout legs primarily in her bilateral calves TTP b/l planter feet SLR neg Did not  observe allodynia in her legs No significant L-spine or C-spine tenderness to palpation again today. Mild TTP posterior to medial malleolus on the left. No significant knee joint TTP or pain with ROM, varus and valgus stress.  Knee MRI Left 06/13/23 IMPRESSION: 1. Complex degenerative tear at the junction of the anterior horn and body of the lateral meniscus. The anterior horn is markedly diminutive compatible with degenerative maceration. 2. Osteoarthritis about the knee appears worst in the lateral compartment. 3. Pes anserine bursitis. 4. Fluid anterior to the medial head of gastrocnemius may be due to a ganglion cyst or bursitis.      Assessment & Plan:  1) Chronic bilateral leg pain with less severe pain in bilateral distal arms/hand -Unclear cause of her diffuse lower leg pain  -HX Chronic venous insufficiency may be contributing -She reports she had a test for neuropathy that was negative? 2) Plantar fasciitis, she reports this is improved  -Has been treated with shockwave therapy, custom orthotics with improvement.  Following with sports medicine 3) Left knee OA  -improved with viscosupplementation 4) History of DVT.  Previously seen by vascular felt to have deep venous reflux in the left. 5) Anterior neck pain  -Neck pain improved today  -She denies lower back pain but I did not see this on chart review in different providers note  -DC gabapentin  low-dose-patient reports she did not tolerate this well -Discussed compression stockings-patient reports she has tried without benefit -Discussed foods that can be helpful for pain -Could consider electrodiagnostic study to help evaluate for neuropathy -TENS unit was advised prior visit -She reports minimal benefit with Cymbalta /multiple NSAIDs/Voltaren gel in the past -Continue follow-up with rheumatology as directed prior note Dr. Jeannetta indicate considering restarting low-dose systemic steroids. -Could consider x-ray of her  hands to evaluate for features of OA.   -Continue magnesium supplement, patient reports this is helping -Will order MRI of left lower extremity tibia/fibula to help evaluate for any additional causes of this persistent pain-provided patient phone number to call to schedule - Patient is worried about pain exacerbation, particularly with her upcoming cruise, will try Journavx  for occasional pain exacerbations.  Discussed risks and benefits.  Discussed this is for intermittent acute pain exacerbations not for continued daily use.

## 2024-07-14 ENCOUNTER — Encounter: Payer: Self-pay | Admitting: Physical Medicine & Rehabilitation

## 2024-07-30 ENCOUNTER — Encounter: Payer: Self-pay | Admitting: Physical Medicine & Rehabilitation

## 2024-08-13 ENCOUNTER — Encounter: Payer: Self-pay | Admitting: Physical Medicine & Rehabilitation

## 2024-08-14 ENCOUNTER — Ambulatory Visit
Admission: RE | Admit: 2024-08-14 | Discharge: 2024-08-14 | Disposition: A | Discharge status: Home / Self Care | Source: Ambulatory Visit | Attending: Physical Medicine & Rehabilitation | Admitting: Physical Medicine & Rehabilitation

## 2024-08-14 ENCOUNTER — Telehealth: Payer: Self-pay

## 2024-08-14 DIAGNOSIS — M79604 Pain in right leg: Secondary | ICD-10-CM

## 2024-08-14 LAB — HM MAMMOGRAPHY

## 2024-08-14 MED ORDER — AZITHROMYCIN 250 MG PO TABS: 250.0000 mg | ORAL_TABLET | Freq: Every day | ORAL | 0 refills | Status: DC

## 2024-08-14 NOTE — Telephone Encounter (Signed)
 Called and spoke with the pt. Sending rx to patient preferred pharmacy. Pt is aware to take 2 today then one daily. Nothing further needed.

## 2024-08-14 NOTE — Telephone Encounter (Signed)
Please send  Zpak 250 mg, # 6, 2 today then one daily ?

## 2024-08-14 NOTE — Telephone Encounter (Signed)
 Copied from CRM #[id number]. Topic: Clinical - Medical Advice >> Aug 14, 2024  8:38 AM [name] wrote: Reason for CRM: Patient [id number] states woke up today with sore throat, runny nose, nasal congestion yellow discharge, and coughing. Patient states no fever, shortness of breath, nor pain and negative for Covid. Patient has asthma and asking for an antibiotics. Please advise and call back.   [facility] [address], [place] - [address] [place] [address] Phone: [phone] Fax: [phone]  Pt is complaining of waking up with congested nose, sore throat and productive coughing. No fever present. Negative for COVID. No chest pain or SOB, requesting antibiotics.  Dr. Neysa can you please advise.

## 2024-08-15 ENCOUNTER — Other Ambulatory Visit: Payer: Self-pay | Admitting: Family

## 2024-08-15 DIAGNOSIS — E119 Type 2 diabetes mellitus without complications: Secondary | ICD-10-CM

## 2024-09-14 ENCOUNTER — Ambulatory Visit: Admitting: Family

## 2024-09-26 ENCOUNTER — Ambulatory Visit (INDEPENDENT_AMBULATORY_CARE_PROVIDER_SITE_OTHER): Admitting: Family

## 2024-09-26 ENCOUNTER — Telehealth: Payer: Self-pay | Admitting: Family

## 2024-09-26 VITALS — BP 110/73 | HR 79 | Temp 98.7°F | Resp 16 | Ht 67.0 in | Wt 203.0 lb

## 2024-09-26 DIAGNOSIS — E119 Type 2 diabetes mellitus without complications: Secondary | ICD-10-CM | POA: Diagnosis not present

## 2024-09-26 DIAGNOSIS — J339 Nasal polyp, unspecified: Secondary | ICD-10-CM

## 2024-09-26 DIAGNOSIS — F4320 Adjustment disorder, unspecified: Secondary | ICD-10-CM

## 2024-09-26 DIAGNOSIS — K219 Gastro-esophageal reflux disease without esophagitis: Secondary | ICD-10-CM | POA: Diagnosis not present

## 2024-09-26 DIAGNOSIS — J4550 Severe persistent asthma, uncomplicated: Secondary | ICD-10-CM | POA: Diagnosis not present

## 2024-09-26 DIAGNOSIS — E782 Mixed hyperlipidemia: Secondary | ICD-10-CM

## 2024-09-26 DIAGNOSIS — E785 Hyperlipidemia, unspecified: Secondary | ICD-10-CM

## 2024-09-26 DIAGNOSIS — I10 Essential (primary) hypertension: Secondary | ICD-10-CM | POA: Diagnosis not present

## 2024-09-26 DIAGNOSIS — Z7985 Long-term (current) use of injectable non-insulin antidiabetic drugs: Secondary | ICD-10-CM

## 2024-09-26 MED ORDER — TIRZEPATIDE 5 MG/0.5ML ~~LOC~~ SOAJ: 5.0000 mg | SUBCUTANEOUS | 0 refills | Status: DC

## 2024-09-26 MED ORDER — LISINOPRIL 20 MG PO TABS: 20.0000 mg | ORAL_TABLET | Freq: Every day | ORAL | 1 refills | Status: AC

## 2024-09-26 MED ORDER — OMEPRAZOLE 20 MG PO CPDR: 20.0000 mg | DELAYED_RELEASE_CAPSULE | Freq: Every day | ORAL | 1 refills | Status: DC

## 2024-09-26 MED ORDER — ROSUVASTATIN CALCIUM 5 MG PO TABS: 5.0000 mg | ORAL_TABLET | Freq: Every day | ORAL | 1 refills | Status: AC

## 2024-09-26 NOTE — Assessment & Plan Note (Signed)
 Lab Results  Component Value Date   CHOL 178 06/15/2024   HDL 45 (L) 06/15/2024   LDLCALC 110 (H) 06/15/2024   TRIG 118 06/15/2024   CHOLHDL 4.0 06/15/2024   Continues crestor , lipids stable.

## 2024-09-26 NOTE — Assessment & Plan Note (Signed)
 BP Readings from Last 3 Encounters:  09/26/24 110/73  07/13/24 119/82  06/15/24 (!) 140/90   BP stable on lisinopril - continue same.

## 2024-09-26 NOTE — Assessment & Plan Note (Addendum)
 Lab Results  Component Value Date   HGBA1C 6.1 (H) 06/15/2024   HGBA1C 6.3 12/06/2023   HGBA1C 6.6 (H) 08/30/2023   Lab Results  Component Value Date   MICROALBUR 0.3 06/15/2024   LDLCALC 110 (H) 06/15/2024   CREATININE 0.66 06/15/2024   Stable, tolerating mounjaro , will increase to 5 mg weekly to help with weight loss.

## 2024-09-26 NOTE — Patient Instructions (Signed)
 VISIT SUMMARY:  Today, we discussed your current medications and health concerns, including diabetes, obesity, asthma, hypertension, hyperlipidemia, joint pain, GERD, OCD, and nasal polyps. We made some adjustments to your medications and discussed your ongoing treatments.  YOUR PLAN:  TYPE 2 DIABETES MELLITUS: Your A1c is at 6.1%, and while Mounjaro  has increased your satiety, it hasn't led to sufficient weight loss. -Increase Mounjaro  to 5 mg. -Be aware of potential mild side effects like nausea and constipation. -A three-month supply of Mounjaro  will be requested.  OBESITY: You have experienced minimal weight loss with your current dose of Mounjaro . -Increase Mounjaro  to 5 mg.  ASTHMA: Your asthma is well-controlled with Dupixent . -Continue using Dupixent  as prescribed.  HYPERTENSION: Your blood pressure is well-controlled with lisinopril . -A prescription for lisinopril  will be sent to your pharmacy.  HYPERLIPIDEMIA: Your cholesterol is managed with Crestor , though you have had joint pain from cholesterol medications in the past. -Continue with a low dose of Crestor . -A prescription for Crestor  will be sent to your pharmacy.  LEFT ANKLE AND FOOT PAIN: You are undergoing physical therapy as recommended by your podiatrist. -Continue with physical therapy as recommended.  LEFT KNEE PAIN WITH POSSIBLE OSTEOARTHRITIS: Your knee pain has improved but some joint pain persists, possibly related to menopause. -Monitor your knee pain and follow up if it worsens.  GASTROESOPHAGEAL REFLUX DISEASE (GERD): You have intermittent chest pain due to esophageal gas, and Mephrizol has been effective in the past. -Prescribe omeprazole  for acid management.  OBSESSIVE-COMPULSIVE DISORDER (OCD): You have symptoms of OCD, but they are not significantly impacting your daily life. -Monitor your symptoms and follow up if they worsen.  NASAL POLYPS: You had surgery in 2008 and currently have no breathing  issues. -No current treatment needed.

## 2024-09-26 NOTE — Assessment & Plan Note (Signed)
 Still grieving the loss of her mother- holidays and anniversary time are the hardest for her.

## 2024-09-26 NOTE — Progress Notes (Signed)
 Subjective:     Patient ID: [name], female    DOB: August 08, 1966, 58 y.o.   MRN: [id number]  Chief Complaint  Patient presents with   Diabetes    Here for follow up   Hypertension    Here for follow up    Diabetes  Hypertension    Discussed the use of AI scribe software for clinical note transcription with the patient, who gave verbal consent to proceed.  History of Present Illness [name] is a 58 year old female with asthma and obesity who presents for medication follow-up.  She experiences minimal weight loss and feels her current dose of Mounjaro  is ineffective for weight and appetite management. Her last A1c was 6.1, and she is reducing sugary drink intake. She has no side effects from Mounjaro .  Her asthma is well-controlled with Dupixent , with no recent wheezing episodes. She is satisfied with this medication.  She experiences left knee pain, which has improved. She is seeing a podiatrist for foot pain and has had an MRI of the tibia and fibula. Her left ankle is not swollen but causes foot pain.  She is experiencing emotional distress related to the upcoming anniversary of her mother's death, crying occasionally but not feeling depressed. She has support from her husband, brother, and friends.  She had previous episode of atypical chest pain which resolved with omeprazole - would like to restart this due to GERD sxs.     Health Maintenance Due  Topic Date Due   Pneumococcal Vaccine: 50+ Years (2 of 2 - PCV) 07/16/2016   Mammogram  08/11/2024    Past Medical History:  Diagnosis Date   Allergic rhinitis, cause unspecified    Allergy     Arthritis    left knee   Asthma    hx of severe asthma w/ copd, follows w/ Dr. Reggy Salt, pulmonologist and PCP (Per 08/30/23 note from Eleanor Archer, NP, asthma is much improved on Dupixent  and Trellegy.) As of 08/30/23, per patient, last asthma exacerbation in early 05/2023 when patient went to [place] .  Patient states her asthma is greatly improved on Dupixent  injections and that she rarely uses prednisone .   Clotting disorder    DVT 2005   Complication of anesthesia    Diabetes type 2, controlled (HCC)    no meds, controlled with diet and exercise   History of DVT (deep vein thrombosis) 2005   Pt reports blood clot below knee ?unsure which leg.   Hyperlipidemia    Nasal polyps    Osteopenia    PONV (postoperative nausea and vomiting)    Unspecified essential hypertension    Follows w/ PCP.   Wears glasses     Past Surgical History:  Procedure Laterality Date   COLONOSCOPY     02/05/21 & 12/12/18hx of colon polyps   CYSTOSCOPY N/A 09/14/2023   Procedure: CYSTOSCOPY;  Surgeon: Sarrah Browning, MD;  Location: [facility];  Service: Gynecology;  Laterality: N/A;   DILATION AND CURETTAGE OF UTERUS  2017   removed fibroids   LASIK Bilateral 2008   NASAL SINUS SURGERY  2008   Dr Arlana   OVARIAN CYST REMOVAL  02/2016   pt reported-- Dr Sarrah   POLYPECTOMY     ROBOTIC ASSISTED TOTAL HYSTERECTOMY WITH BILATERAL SALPINGO OOPHERECTOMY Bilateral 09/14/2023   Procedure: XI ROBOTIC ASSISTED TOTAL HYSTERECTOMY WITH BILATERAL SALPINGECTOMY;  Surgeon: Sarrah Browning, MD;  Location: [facility];  Service: Gynecology;  Laterality: Bilateral;   UTERINE  FIBROID SURGERY  2005    Family History  Problem Relation Age of Onset   Cancer Mother 30       ?carcinoid tumor (had surgical removal)   Diabetes Mother        borderline   Stomach cancer Mother    Liver cancer Mother    Heart disease Father        died 40, from MI, had hx of ETOH abuse and liver disease   Thyroid  cancer Sister        (diagnosed at 18) half sister (same dad)    Diabetes type II Brother    Kidney disease Neg Hx    Hypertension Neg Hx    Hyperlipidemia Neg Hx    Colon cancer Neg Hx    Esophageal cancer Neg Hx    Rectal cancer Neg Hx    Colon polyps Neg Hx     Social History    Socioeconomic History   Marital status: Married    Spouse name: Not on file   Number of children: Not on file   Years of education: Not on file   Highest education level: Not on file  Occupational History   Occupation: Production designer, theatre/television/film [employer]  Tobacco Use   Smoking status: Never    Passive exposure: Past   Smokeless tobacco: Never  Vaping Use   Vaping status: Never Used  Substance and Sexual Activity   Alcohol use: Not Currently   Drug use: No   Sexual activity: Yes    Birth control/protection: None, Post-menopausal  Other Topics Concern   Not on file  Social History Narrative   Married   No children   No pets   Works at [employer] as a Production designer, theatre/television/film    Enjoys movies, shopping, 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     Social Drivers of Corporate investment banker Strain: Not on file  Food Insecurity: Not on file  Transportation Needs: Not on file  Physical Activity: Not on file  Stress: Not on file  Social Connections: Not on file  Intimate Partner Violence: Not on file    Outpatient Medications Prior to Visit  Medication Sig Dispense Refill   albuterol  (PROAIR  HFA) 108 (90 Base) MCG/ACT inhaler Inhale 2 puffs into the lungs every 6 (six) hours as needed for wheezing or shortness of breath. 18 g 12   albuterol  (PROVENTIL ) (2.5 MG/3ML) 0.083% nebulizer solution Take 2.5 mg by nebulization every 4 (four) hours as needed for wheezing or shortness of breath.     Calcium  Carbonate-Vit D-Min (CALTRATE 600+D PLUS MINERALS) 600-800 MG-UNIT CHEW Chew 1 tablet by mouth in the morning and at bedtime. 60 tablet    cetirizine (ZYRTEC) 10 MG tablet Take 10 mg by mouth daily.     Cyanocobalamin (B-12) 2500 MCG SUBL Place under the tongue.     Dupilumab  (DUPIXENT ) 300 MG/2ML SOAJ INJECT 1 PEN SUBCUTANEOUSLY EVERY 14 DAYS 12 mL 3   fluticasone  (FLONASE ) 50 MCG/ACT nasal spray 2 sprays each nostril daily 16 g 12   Fluticasone -Umeclidin-Vilant (TRELEGY ELLIPTA )  100-62.5-25 MCG/ACT AEPB Inhale 1 puff into the lungs daily. 60 each 11   Multiple Vitamin (MULTIVITAMIN WITH MINERALS) TABS tablet Take 1 tablet by mouth daily. For 50 over.     celecoxib  (CELEBREX ) 100 MG capsule Take 1 capsule (100 mg total) by mouth 2 (two) times daily. 30 capsule 1   lisinopril  (ZESTRIL ) 20 MG tablet Take 1 tablet (20 mg total)  by mouth daily. 90 tablet 1   MOUNJARO  2.5 MG/0.5ML Pen INJECT 1/2 (ONE-HALF) ML SUBCUTANEOUSLY  ONCE A WEEK 4 mL 0   rosuvastatin  (CRESTOR ) 5 MG tablet Take 1 tablet (5 mg total) by mouth daily. 90 tablet 1   Suzetrigine  50 MG TABS Take 50 mg by mouth every 12 (twelve) hours as needed. Take two 50mg  capsules for your first dose at the start of a pain episode on an empty stomach, followed by 50mg  (1 capsule)  with or without food every 12 hours until pain episode is improved 29 tablet 0   azithromycin  (ZITHROMAX ) 250 MG tablet Take 1 tablet (250 mg total) by mouth daily. 6 tablet 0   No facility-administered medications prior to visit.    Allergies  Allergen Reactions   Aspirin Other (See Comments)    wheezing   Latex Hives   Shellfish Allergy  Swelling   Shellfish-Derived Products Other (See Comments)    Anaphylactic reaction   Gabapentin  Other (See Comments)    Stange feeling and increased appetite   Penicillins Rash and Other (See Comments)    Product containing penicillin (product)    ROS See HPI    Objective:    Physical Exam Constitutional:      General: She is not in acute distress.    Appearance: Normal appearance. She is well-developed.  HENT:     Head: Normocephalic and atraumatic.     Right Ear: External ear normal.     Left Ear: External ear normal.  Eyes:     General: No scleral icterus. Neck:     Thyroid : No thyromegaly.  Cardiovascular:     Rate and Rhythm: Normal rate and regular rhythm.     Heart sounds: Normal heart sounds. No murmur heard. Pulmonary:     Effort: Pulmonary effort is normal. No respiratory  distress.     Breath sounds: Normal breath sounds. No wheezing.  Musculoskeletal:     Cervical back: Neck supple.  Skin:    General: Skin is warm and dry.  Neurological:     Mental Status: She is alert and oriented to person, place, and time.  Psychiatric:        Mood and Affect: Mood normal.        Behavior: Behavior normal.        Thought Content: Thought content normal.        Judgment: Judgment normal.      BP 110/73 (BP Location: Right Arm, Patient Position: Sitting, Cuff Size: Large)   Pulse 79   Temp 98.7 F (37.1 C) (Oral)   Resp 16   Ht 5' 7 (1.702 m)   Wt 203 lb (92.1 kg)   LMP 06/22/2018   SpO2 100%   BMI 31.79 kg/m  Wt Readings from Last 3 Encounters:  09/26/24 203 lb (92.1 kg)  07/13/24 202 lb (91.6 kg)  06/15/24 203 lb (92.1 kg)       Assessment & Plan:   Problem List Items Addressed This Visit       Unprioritized   Severe persistent asthma (HCC)   Doing great on Dupixent .       Primary hypertension   BP Readings from Last 3 Encounters:  09/26/24 110/73  07/13/24 119/82  06/15/24 (!) 140/90   BP stable on lisinopril - continue same.      Relevant Medications   rosuvastatin  (CRESTOR ) 5 MG tablet   lisinopril  (ZESTRIL ) 20 MG tablet   RESOLVED: Nasal polyps   Hyperlipidemia   Lab  Results  Component Value Date   CHOL 178 06/15/2024   HDL 45 (L) 06/15/2024   LDLCALC 110 (H) 06/15/2024   TRIG 118 06/15/2024   CHOLHDL 4.0 06/15/2024   Continues crestor , lipids stable.       Relevant Medications   rosuvastatin  (CRESTOR ) 5 MG tablet   lisinopril  (ZESTRIL ) 20 MG tablet   Grief reaction   Still grieving the loss of her mother- holidays and anniversary time are the hardest for her.       Diabetes type 2, controlled (HCC)   Lab Results  Component Value Date   HGBA1C 6.1 (H) 06/15/2024   HGBA1C 6.3 12/06/2023   HGBA1C 6.6 (H) 08/30/2023   Lab Results  Component Value Date   MICROALBUR 0.3 06/15/2024   LDLCALC 110 (H) 06/15/2024    CREATININE 0.66 06/15/2024   Stable, tolerating mounjaro , will increase to 5 mg weekly to help with weight loss.      Relevant Medications   tirzepatide  (MOUNJARO ) 5 MG/0.5ML Pen   rosuvastatin  (CRESTOR ) 5 MG tablet   lisinopril  (ZESTRIL ) 20 MG tablet   Other Visit Diagnoses       Controlled type 2 diabetes mellitus without complication, without long-term current use of insulin (HCC)    -  Primary   Relevant Medications   tirzepatide  (MOUNJARO ) 5 MG/0.5ML Pen   rosuvastatin  (CRESTOR ) 5 MG tablet   lisinopril  (ZESTRIL ) 20 MG tablet   Other Relevant Orders   Basic Metabolic Panel (BMET)   HgB A1c     Gastroesophageal reflux disease, unspecified whether esophagitis present       Relevant Medications   omeprazole  (PRILOSEC) 20 MG capsule       I have discontinued Vallory [name] Sides's celecoxib , Suzetrigine , azithromycin , and Mounjaro . I am also having her start on tirzepatide  and omeprazole . Additionally, I am having her maintain her cetirizine, albuterol , fluticasone , Caltrate 600+D Plus Minerals, multivitamin with minerals, B-12, Dupixent , albuterol , Trelegy Ellipta , rosuvastatin , and lisinopril .  Meds ordered this encounter  Medications   tirzepatide  (MOUNJARO ) 5 MG/0.5ML Pen    Sig: Inject 5 mg into the skin once a week.    Dispense:  6 mL    Refill:  0    Supervising Provider:   DOMENICA BLACKBIRD A [[id number]]   omeprazole  (PRILOSEC) 20 MG capsule    Sig: Take 1 capsule (20 mg total) by mouth daily.    Dispense:  90 capsule    Refill:  1    Supervising Provider:   DOMENICA BLACKBIRD A [[id number]]   rosuvastatin  (CRESTOR ) 5 MG tablet    Sig: Take 1 tablet (5 mg total) by mouth daily.    Dispense:  90 tablet    Refill:  1    Supervising Provider:   DOMENICA BLACKBIRD A [[id number]]   lisinopril  (ZESTRIL ) 20 MG tablet    Sig: Take 1 tablet (20 mg total) by mouth daily.    Dispense:  90 tablet    Refill:  1    Supervising Provider:   DOMENICA BLACKBIRD A [[id number]]

## 2024-09-26 NOTE — Assessment & Plan Note (Signed)
 Doing great on Dupixent .

## 2024-09-26 NOTE — Telephone Encounter (Signed)
 Please request mammo from [facility]

## 2024-09-27 ENCOUNTER — Ambulatory Visit: Payer: Self-pay | Admitting: Family

## 2024-09-27 ENCOUNTER — Other Ambulatory Visit (HOSPITAL_COMMUNITY): Payer: Self-pay

## 2024-09-27 LAB — BASIC METABOLIC PANEL WITH GFR
BUN: 15 mg/dL (ref 6–23)
CO2: 27 meq/L (ref 19–32)
Calcium: 9.4 mg/dL (ref 8.4–10.5)
Chloride: 106 meq/L (ref 96–112)
Creatinine, Ser: 0.74 mg/dL (ref 0.40–1.20)
GFR: 90.15 mL/min (ref >60.00)
Glucose, Bld: 74 mg/dL (ref 70–99)
Potassium: 3.9 meq/L (ref 3.5–5.1)
Sodium: 141 meq/L (ref 135–145)

## 2024-09-27 LAB — HEMOGLOBIN A1C: Hgb A1c MFr Bld: 6.3 % (ref 4.6–6.5)

## 2024-09-27 NOTE — Telephone Encounter (Signed)
 Electronic request made

## 2024-10-12 ENCOUNTER — Encounter: Payer: Self-pay | Admitting: Physical Medicine & Rehabilitation

## 2024-10-12 ENCOUNTER — Encounter: Attending: Physical Medicine & Rehabilitation | Admitting: Physical Medicine & Rehabilitation

## 2024-10-12 VITALS — BP 124/84 | HR 78 | Ht 67.0 in | Wt 205.8 lb

## 2024-10-12 DIAGNOSIS — M79604 Pain in right leg: Secondary | ICD-10-CM | POA: Insufficient documentation

## 2024-10-12 DIAGNOSIS — M7662 Achilles tendinitis, left leg: Secondary | ICD-10-CM | POA: Diagnosis present

## 2024-10-12 DIAGNOSIS — M79605 Pain in left leg: Secondary | ICD-10-CM | POA: Diagnosis present

## 2024-10-12 DIAGNOSIS — M722 Plantar fascial fibromatosis: Secondary | ICD-10-CM | POA: Diagnosis present

## 2024-10-12 DIAGNOSIS — M7661 Achilles tendinitis, right leg: Secondary | ICD-10-CM | POA: Diagnosis not present

## 2024-10-12 MED ORDER — VITAMIN D (CHOLECALCIFEROL) 25 MCG (1000 UT) PO CAPS: 1.0000 | ORAL_CAPSULE | Freq: Every day | ORAL | 3 refills | Status: DC

## 2024-10-12 MED ORDER — VITAMIN D (CHOLECALCIFEROL) 25 MCG (1000 UT) PO CAPS: 1.0000 | ORAL_CAPSULE | Freq: Every day | ORAL | 3 refills | Status: AC

## 2024-10-12 MED ORDER — SUZETRIGINE 50 MG PO TABS
50.0000 mg | ORAL_TABLET | Freq: Two times a day (BID) | ORAL | 0 refills | Status: AC | PRN
Start: 2024-10-12 — End: ?

## 2024-10-12 NOTE — Progress Notes (Addendum)
 "  Subjective:    Patient ID: [name], female    DOB: May 01, 1966, 58 y.o.   MRN: [id number]  HPI    [name] is a 58 y.o. year old female  who  has a past medical history of Allergic rhinitis, cause unspecified, Allergy , Arthritis, Asthma, Clotting disorder, Complication of anesthesia, Diabetes type 2, controlled (HCC), History of DVT (deep vein thrombosis) (2005), Hyperlipidemia, Nasal polyps, Osteopenia, PONV (postoperative nausea and vomiting), Unspecified essential hypertension, and Wears glasses.   They are presenting to PM&R clinic as a new patient for pain management evaluation. They were referred by NP Eleanor Ponto for treatment of bilateral leg pain.  Patient reports persistent sharp pains in her bilateral legs, and arms to a lesser degree.  Pain began after she discontinued use of chronic steroids for treatment of asthma.  She reports alternative nonsteroid asthma medications are keeping him asthma well-controlled thus she was able to stop oral steroids after many years. Pain is present all the time, sometimes is is improved with activity.  Pain is sharp in quality.  She will get tingling in her feet sometimes.  Pain often gets worse at night.   She has been followed by rheumatology Dr. Jeannetta for polyarthralgia and positive ANA.    She has also been diagnosed with plantar fasciitis and left knee OA.  She reports she had gel injections in her left knee that helped her left knee pain.  She reports her knee pain is overall under control at this time.   She reports she had vascular studies for PVD and these were negative.  Patient has pill bottles of Celebrex  and Cymbalta  and reports these were not very beneficial to her pain.  She reports plantar fasciitis is doing better with shoe inserts and shockwave treatment.  She works at [employer] job where she does a lot of standing.  Denies depression but reports mood was decreased after she lost her mom in 2022.  Reports  she has trouble falling asleep but says she sleeps okay overall.  She denies any pain in her lower back.  Reports occasional anterior neck pain.     Red flag symptoms: No red flags for back pain endorsed in Hx or ROS  Medications tried: Topical medications- Voltaren gel- helps a little  Nsaids Celebrex - doesn't help Tylenol - Helps a little  Opiates denies Gabapentin  / Lyrica  denied but later chart review indicated use of Lyrica  TCAs denies SNRIs  - cymbalta  does not help at all   Other treatments: PT/OT  -treatment for plantar fasciiits beginning of 2024 TENs unit - has not tried for pain  Injections - L knee gel- helped knee pain  Surgery- hysterectomy    Prior UDS results: No results found for: LABOPIA, COCAINSCRNUR, LABBENZ, AMPHETMU, THCU, LABBARB   Interval history 03/08/2024 Patient continues to have pain primarily in her legs left greater than right.  It is worse below her knees but also present in her lower thighs.  She was seen by sports medicine/Ortho care for foot pain, plantar fasciitis pes planovalgus and left posterior tibial tendon insufficiency, she was prescribed low-dose prednisone  20 mg daily for 1 week with improvement of her pain.  Shockwave treatment was repeated.  Patient is concerned that pain is coming back as prednisone  has been discontinued.  She has about TENS unit because this device helped her friend with pain.  She is unable to tolerate gabapentin  and has discontinued this medication.   Interval History 07/14/24 Pain largely  unchanged from her last visit.  She continues to have pain it is worse in her left lateral lower leg.  Mild pain in her right lower leg, this side is doing okay.  She reports occasional hand pain with she feels it is safe and cramping in this area.  Neck pain is also doing okay, not bothering her too much recently. Patient has started magnesium supplement which she feels like has been helping her pain.  Patient is  going on a cruise soon, worried about pain worsening as she goes.  Sometimes activity will worsen her pain.  She has not yet completed MRI, she wants to get it scheduled.  I gave her the number to call [facility] imaging.  Interval History 10/13/24 Reports some improvement in left ankle/foot pain. Continues to experience pain in the ankle and into the shin. Current pain level is 4-5/10. Denies significant knee pain, but reports a history of a small fluid collection behind the knee noted on a prior X-ray by rheumatology. Hands are also improved, with occasional pain and some difficulty with grip strength when opening objects. Denies numbness in the hands.  Pain is located primarily on the lateral aspect of the left ankle and under the bottom of the foot, sometimes on the heel. The tenderness to touch in the calves and on the bottom of the foot has improved. The pain has been present for a couple of years.  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 Walks for exercise 2-3 times per week. Attends a stretching class. Experiences challenges with a full flight of stairs.  Medications: Journavx  is taken as needed and is reported to be effective. Takes calcium  and magnesium supplements. Was on prednisone  for many years, but has been off for three years.   Pain Inventory Average Pain 5 Pain Right Now 4 My pain is intermittent and aching  In the last 24 hours, has pain interfered with the following? General activity 4 Relation with others 4 Enjoyment of life 4 What TIME of day is your pain at its worst? Daytime Sleep (in general) Fair  Pain is worse with: Bending and some activities Pain improves with: medication Relief from Meds: 9        Family History  Problem Relation Age of Onset   Cancer Mother 49       ?carcinoid tumor (had surgical removal)   Diabetes Mother        borderline   Stomach cancer Mother    Liver cancer Mother    Heart disease Father        died 19, from  MI, had hx of ETOH abuse and liver disease   Thyroid  cancer Sister        (diagnosed at 51) half sister (same dad)    Diabetes type II Brother    Kidney disease Neg Hx    Hypertension Neg Hx    Hyperlipidemia Neg Hx    Colon cancer Neg Hx    Esophageal cancer Neg Hx    Rectal cancer Neg Hx    Colon polyps Neg Hx    Social History   Socioeconomic History   Marital status: Married    Spouse name: Not on file   Number of children: Not on file   Years of education: Not on file   Highest education level: Not on file  Occupational History   Occupation: Production Designer, 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     Social Drivers of Corporate Investment Banker Strain: Not on file  Food Insecurity: Not on file  Transportation Needs: Not on file  Physical Activity: Not on file  Stress: Not on file  Social Connections: Not on file   Past Surgical History:  Procedure Laterality Date   COLONOSCOPY     02/05/21 & 12/12/18hx of colon polyps   CYSTOSCOPY N/A 09/14/2023   Procedure: CYSTOSCOPY;  Surgeon: Sarrah Browning, MD;  Location: [facility];  Service: Gynecology;  Laterality: N/A;   DILATION AND CURETTAGE OF UTERUS  2017   removed fibroids   LASIK Bilateral 2008   NASAL SINUS SURGERY  2008   Dr Arlana   OVARIAN CYST REMOVAL  02/2016   pt reported-- Dr Sarrah   POLYPECTOMY     ROBOTIC ASSISTED TOTAL HYSTERECTOMY WITH BILATERAL SALPINGO OOPHERECTOMY Bilateral 09/14/2023   Procedure: XI  ROBOTIC ASSISTED TOTAL HYSTERECTOMY WITH BILATERAL SALPINGECTOMY;  Surgeon: Sarrah Browning, MD;  [facility]  Service: Gynecology;  Laterality: Bilateral;   UTERINE FIBROID SURGERY  2005   Past Medical History:  Diagnosis Date   Allergic rhinitis, cause unspecified    Allergy     Arthritis    left knee   Asthma    hx of severe asthma w/ copd, follows w/ Dr. Reggy Salt, pulmonologist and PCP (Per 08/30/23 note from Eleanor Archer, NP, asthma is much improved on Dupixent  and Trellegy.) As of 08/30/23, per patient, last asthma exacerbation in early 05/2023 when patient went to [place] . Patient states her asthma is greatly improved on Dupixent  injections and that she rarely uses prednisone .   Clotting disorder    DVT 2005   Complication of anesthesia    Diabetes type 2, controlled (HCC)    no meds, controlled with diet and exercise   History of DVT (deep vein thrombosis) 2005   Pt reports blood clot below knee ?unsure which leg.   Hyperlipidemia    Nasal polyps    Osteopenia    PONV (postoperative nausea and vomiting)    Unspecified essential hypertension    Follows w/ PCP.   Wears glasses    BP 124/84 (BP Location: Left Arm, Patient Position: Sitting, Cuff Size: Large)   Pulse 78   Ht 5' 7 (1.702 m)   Wt 205 lb 12.8 oz (93.4 kg)   LMP 06/22/2018   SpO2 100%   BMI 32.23 kg/m   Opioid Risk Score:   Fall Risk Score:  `1  Depression screen [facility] 2/9     07/13/2024    9:26 AM 03/08/2024    3:40 PM 12/29/2023    2:28 PM 12/06/2023    5:54 PM 09/20/2022   10:25 AM 04/30/2022    4:34 PM 11/07/2020    2:29 PM  Depression screen PHQ 2/9  Decreased Interest 0 0 0 0 0 0 0  Down, Depressed, Hopeless 0 0 0 0 0 0 0  PHQ - 2  Score 0 0 0 0 0 0 0  Altered sleeping   0 0   1  Tired, decreased energy   0 0   1  Change in appetite   0 0   0  Feeling bad or failure about yourself    0 0   0  Trouble concentrating   0 0   0  Moving slowly or fidgety/restless   0  0   0  Suicidal thoughts   0 0   0  PHQ-9 Score   0 0   2  Difficult doing work/chores    Not difficult at all   Not difficult at all     Review of Systems  Musculoskeletal:        Bilateral leg pain  All other systems reviewed and are negative.      Objective:   Physical Exam   Gen: no distress, normal appearing HEENT: oral mucosa pink and moist, NCAT Chest: normal effort, normal rate of breathing Abd: soft, non-distended Ext: Mild leg edema greater than left side Psych: pleasant, normal affect Skin: intact Neuro: Alert and awake, follows commands, cranial nerves II through XII grossly intact, normal speech and language Strength intact to gravity and resistance in all 4 extremities Sensory exam normal for light touch and pain in all 4 limbs.  No abnormal tone noted Musculoskeletal:   Inspection: No overt signs of acute injury. Flat arches noted bilaterally. Left ankle  Palpation: Tenderness noted on the lateral aspect of the ankle. Tenderness noted right in here when the bottom of the foot is palpated. Mild tenderness over the Achilles tendon or calves, which is an improvement from previous exams. - Range of Motion: Ankle dorsiflexion, plantarflexion, inversion, and eversion are non-painful. - Strength: 5/5 with plantarflexion and dorsiflexion.   Prior exam TTP over b/l MCP of her hands TTP b/l LE throughout legs primarily in her bilateral calves TTP b/l planter feet SLR neg Did not observe allodynia in her legs No significant L-spine or C-spine tenderness to palpation again today. Mild TTP posterior to medial malleolus on the left. No significant knee joint TTP or pain with ROM, varus and valgus stress.  Knee MRI Left 06/13/23 IMPRESSION: 1. Complex degenerative tear at the junction of the anterior horn and body of the lateral meniscus. The anterior horn is markedly diminutive compatible with degenerative maceration. 2. Osteoarthritis about the knee appears  worst in the lateral compartment. 3. Pes anserine bursitis. 4. Fluid anterior to the medial head of gastrocnemius may be due to a ganglion cyst or bursitis.  MRI TIB FIB/ left 2025, aug 26 1. Mild T2 hyperintense marrow signal in the distal tibial diaphysis bilaterally left greater than right, which may reflect mild stress reaction versus red marrow. 2. Mild distal Achilles tendinosis.     Assessment & Plan:  1) Chronic bilateral leg pain with less severe pain in bilateral distal arms/hand  -She reports she had a test for neuropathy that was negative -Evidence of mild stress reaction noted on MRI L>R, achilles tendonitis -Pain gradually improving 2) Plantar fasciitis, she reports this is improved  -Has been treated with shockwave therapy, custom orthotics with improvement.  Following with sports medicine 3) Left knee OA  -improved with viscosupplementation 4) History of DVT.  Previously seen by vascular felt to have deep venous reflux in the left. 5) Anterior neck pain  -Neck pain improved today  -She denies lower back pain but I did not see this on chart review  in different providers note  -DC gabapentin  low-dose-patient reports she did not tolerate this well -Discussed compression stockings-patient reports she has tried without benefit -Discussed foods that can be helpful for pain -Could consider electrodiagnostic study to help evaluate for neuropathy -TENS unit was advised prior visit -She reports minimal benefit with Cymbalta /multiple NSAIDs/Voltaren gel in the past -Continue follow-up with rheumatology as directed prior note Dr. Jeannetta indicate considering restarting low-dose systemic steroids. -Could consider x-ray of her hands to evaluate for features of OA.   -Continue magnesium supplement, patient reports this is helping -Reviewed MRI - Continue Journavx  as needed for pain exacerbations/acute pain.  - Start Vitamin D  1000 IU daily to support bone health. Prescription sent  to pharmacy; also counseled that it is available over the counter. - Will obtain a vitamin D  level. An order was placed. Alternatively, can ask PCP check this at the next 11-month follow-up labs. - Continue using custom orthotics provided by podiatry. - Recommended avoiding excessive activity that exacerbates pain (high impact activities, running) to allow the stress reaction to heal.  - Encouraged continuing home stretching, particularly calf stretches   - Recommended using ice on the affected area for pain and inflammation. - Discussed potentially re-engaging with physical therapy if symptoms do not continue to improve. Will hold for now as symptoms are improving.  - Plan to follow up in clinic in 3 months to reassess symptoms and progress.  Called patient 12/27/23- reports pain is improving, she continues to wear insoles, able to walk farther "

## 2024-10-22 ENCOUNTER — Encounter: Payer: Self-pay | Admitting: Radiology

## 2024-11-28 ENCOUNTER — Other Ambulatory Visit: Payer: Self-pay | Admitting: Internal Medicine

## 2024-11-28 DIAGNOSIS — J339 Nasal polyp, unspecified: Secondary | ICD-10-CM

## 2024-11-28 DIAGNOSIS — J4550 Severe persistent asthma, uncomplicated: Secondary | ICD-10-CM

## 2024-11-28 NOTE — Progress Notes (Signed)
 " Patient ID: [name], female    DOB: 1966-11-23, 58 y.o.   MRN: [id number] F followed for moderate persistent asthma with COPD (requiring frequent steroids , Failed Xolair , , hx nasal polyps, allergic rhinitis, hx DVT PFT- 09/09/11- FEV1 1.64/ 54%, FEV1/FVC 0.54, FEF25-75% 0.74/ 22%, insignif response to bronchodilator TLC 0.74, DLCO 72%. Moderate obstructive disease, mild restriction, mild reduction of DLCO. Office spirometry- mild obstructive airways disease. FEV1 1.95/73%, FVC 3.01/93%, FEV1/FVC 0.65, FEF 25-75% 1.26/38%. PFT 11/22/23- moderately severe obstruction with response to bronchodilator, over-inflation/ airtrapping, Normal DLCO  -----------------------------------------------------------------   11/22/23- 58 year old female never smoker followed for severe persistent Asthma with COPD (frequent steroids, failed Xolair ), Nasal Polyps/ Samter's Triad/Aspirin Allergy , allergic Rhinitis, history DVT, DM2,  -Albuterol  HFA, Neb albuterol / atrovent , EpiPen , Trelegy100,  prednisone  10 mg, Dupixent , Zyrtec,  Flonase , nasal saline,  Dupixent  refilled to last through end of 2024. Loves Dupixent  which has markedly reduced need for prednisone , even through episodes in past year of Covid and Flu. PFT 11/22/23- moderately severe obstruction with response to bronchodilator, over-inflation/ airtrapping, Normal DLCO CXR 11/20/22- IMPRESSION: No active cardiopulmonary disease.  11/29/24- 58 year old female never smoker followed for severe persistent Asthma with COPD (frequent steroids, failed Xolair ), Nasal Polyps/ Samter's Triad/Aspirin Allergy , allergic Rhinitis, history DVT, DM2,  -Albuterol  HFA, Neb albuterol / atrovent , EpiPen , Trelegy100,  prednisone  10 mg, Dupixent , Zyrtec,  Flonase , nasal saline,  Discussed the use of AI scribe software for clinical note transcription with the patient, who gave verbal consent to proceed.  History of Present Illness   [name] is a 58 year old  female with severe persistent asthma w who presents for follow-up. PFTs can be reassessed- she wants to avoid COPD diagnosis.  She reports stable respiratory status this winter with minimal asthma or COPD symptoms. Dupixent  has controlled her disease, and she has not needed prednisone  for nearly a year.  She asks if she needs a repeat bone density test. Her 2023 bone density was slightly low. She is unsure if she is still followed by endocrinology for this, which she raises in the context of her prior steroid use.     CXR 04/13/24 IMPRESSION: No acute abnormality.  Assessment and Plan:    Severe persistent asthma with question of COPD overlap Well-managed with Dupixent , preventing prednisone  use. No significant issues reported over winter. - Continue Dupixent . - Continue Breo. - Continued Nasonex nasal spray. - Continue omeprazole . - Continue oxygen at 3.5 L/min, adjust based on exertion.  Osteopenia Previous bone density scan in 2023 showed low bone density. No prednisone  use for nearly a year, reducing further bone density loss risk. - Order bone density scan .     Review of Systems-See HPI     + = positive Constitutional:   No weight loss, night sweats,  Fevers, chills, fatigue, lassitude. HEENT:   No headaches,  Difficulty swallowing,  Tooth/dental problems,  Sore throat,                No sneezing, itching, ear ache,, + post nasal drip. + nasal congestion. CV:  No chest pain,  Orthopnea, PND, swelling in lower extremities, anasarca, dizziness, palpitations GI  No heartburn, indigestion, abdominal pain, nausea, vomiting, Resp:Usually some shortness of breath with exertion, not at rest.  No excess mucus,  productive cough,          + non-productive cough,  No coughing up of blood.   change in color of mucus.   +wheezing.   Skin: no rash or lesions.  GU: . MS:  + joint pain, + swelling.   Neuro- nothing unusual Psych:  No change in mood or affect. No depression or anxiety.  No  memory loss.  Objective:   Physical Exam General- Alert, Oriented, Affect-appropriate, Distress- none acute  Relaxed and conversational    + overweight,  Skin- rash-none, lesions- none, excoriation- none Lymphadenopathy- none Head- atraumatic            Eyes- Gross vision intact, PERRLA, conjunctivae clear secretions            Ears- Hearing, canals normal            Nose- +Turbinate edema, No-Septal dev, mucus, , erosion, perforation. No definite polyps             Throat- Mallampati III-IV, mucosa -not red , drainage- none, tonsils- atrophic Neck- flexible , trachea midline, no stridor , thyroid  nl, carotid no bruit Chest - symmetrical excursion , unlabored           Heart/CV- RRR , no murmur , no gallop  , no rub, nl s1 s2                           - JVD- none , edema- none , stasis changes- none, varices- none           Lung-  Clear/ diminished, wheeze- none,  unlabored, cough- none , dullness-none, rub- none           Chest wall-  Abd-  Br/ Gen/ Rectal- Not done, not indicated Extrem- cyanosis- none, clubbing, none, atrophy- none, strength- nl.  Neuro- grossly intact to observation               "

## 2024-11-29 ENCOUNTER — Encounter: Payer: Self-pay | Admitting: Internal Medicine

## 2024-11-29 ENCOUNTER — Ambulatory Visit: Admitting: Internal Medicine

## 2024-11-29 VITALS — BP 128/72 | HR 74 | Temp 97.5°F | Ht 67.0 in | Wt 202.8 lb

## 2024-11-29 DIAGNOSIS — M8580 Other specified disorders of bone density and structure, unspecified site: Secondary | ICD-10-CM

## 2024-11-29 DIAGNOSIS — J4550 Severe persistent asthma, uncomplicated: Secondary | ICD-10-CM

## 2024-11-29 MED ORDER — TRELEGY ELLIPTA 100-62.5-25 MCG/ACT IN AEPB: 1.0000 | INHALATION_SPRAY | Freq: Every day | RESPIRATORY_TRACT | 11 refills | Status: AC

## 2024-11-29 NOTE — Patient Instructions (Signed)
 Order- schedule DG Bone Density    dx Osteopenia  Trelegy refilled  Continue Dupixent 

## 2024-11-30 NOTE — Telephone Encounter (Signed)
 Refill sent for DUPIXENT  to [facility]: [phone]  Dose: 300mg  [place] every 14 days  Last OV: 11/29/24 Provider: Dr. Neysa  Next OV: 06/03/25  Aleck Puls, PharmD, BCPS Clinical Pharmacist  [facility]

## 2024-12-12 ENCOUNTER — Telehealth: Payer: Self-pay

## 2024-12-12 NOTE — Telephone Encounter (Signed)
 Copied from CRM [id number]. Topic: Clinical - Medication Question >> Dec 11, 2024  1:08 PM Rozanna G wrote: Reason for CRM: pt calling about her bone density test appt stated to please call her she is wanting to get this scheduled before the year is out. Stated she advised someone of this and wants to know when will it be scheduled. Stated she will be in a meeting between 230-3pm >> Dec 11, 2024  3:54 PM Isabell A wrote: Patient is calling requesting a call back.     Routing to [facility], please schedule.

## 2024-12-14 ENCOUNTER — Encounter: Payer: Self-pay | Admitting: Internal Medicine

## 2024-12-17 ENCOUNTER — Telehealth: Payer: Self-pay | Admitting: Internal Medicine

## 2024-12-17 ENCOUNTER — Ambulatory Visit
Admission: RE | Admit: 2024-12-17 | Discharge: 2024-12-17 | Disposition: A | Source: Ambulatory Visit | Attending: Internal Medicine | Admitting: Internal Medicine

## 2024-12-17 ENCOUNTER — Telehealth: Payer: Self-pay

## 2024-12-17 DIAGNOSIS — Z92241 Personal history of systemic steroid therapy: Secondary | ICD-10-CM

## 2024-12-17 NOTE — Telephone Encounter (Signed)
 Thank you so much.   I will let the [facility] know that she would like it to be done sooner if available

## 2024-12-17 NOTE — Telephone Encounter (Signed)
 As requested- DG bone density reordered to be done in [place] instead of [facility] at request of patient

## 2024-12-17 NOTE — Telephone Encounter (Addendum)
 Spoke with patient in regards to her appointment explain for bone density on 12/26/24 we only send referrals, she should contact the other clinic if she needs to change the appointment gave contact number.  she would still like to speak with provider about the situation explain he is not in clinic and will reach out as soon as he can.       Copied from CRM [id number]. Topic: Clinical - Medication Question >> Dec 11, 2024  1:08 PM Rozanna G wrote: Reason for CRM: pt calling about her bone density test appt stated to please call her she is wanting to get this scheduled before the year is out. Stated she advised someone of this and wants to know when will it be scheduled. Stated she will be in a meeting between 230-3pm >> Dec 12, 2024 12:42 PM Corean SAUNDERS wrote: Patient states she has been waiting 2 days for a call back to schedule her bone density test and is requesting someone to please call her back on Friday morning.  >> Dec 11, 2024  3:54 PM Isabell A wrote: Patient is calling requesting a call back.

## 2024-12-17 NOTE — Telephone Encounter (Signed)
 Spoke to patient. Provided the number to [facility], PAURAV direct line being [phone]. Nothing further needed.

## 2024-12-17 NOTE — Telephone Encounter (Signed)
 Patient was scheduled at [facility]. Patient would not like to have it scheduled at [facility] and prefers [facility]. Spoke to MEHTA, PAURAVI D, to get it scheduled at [facility].She states it would need a new order. I sent a message to Dr.Young in secure chat about this.

## 2024-12-17 NOTE — Telephone Encounter (Signed)
 I got the message from [facility] that patient would like DG bone density study done in [place], not [facility]. I have redone the order ( please verify I did it correctly)

## 2024-12-17 NOTE — Telephone Encounter (Signed)
 Dr.Young has made another order. Patient has been given the number to get scheduled. NFN

## 2024-12-18 ENCOUNTER — Telehealth: Payer: Self-pay

## 2024-12-18 ENCOUNTER — Ambulatory Visit: Payer: Self-pay | Admitting: Internal Medicine

## 2024-12-18 NOTE — Telephone Encounter (Signed)
 Called pt and confirmed she is not having any insurance changes next year. She is currently using a copay card and funds should be automatically readded once the new year begins. Advised her she should be able to continue to fill as normal, provided her my direct phone number if any issues come up. NFN

## 2024-12-18 NOTE — Telephone Encounter (Signed)
 NFN

## 2024-12-18 NOTE — Telephone Encounter (Signed)
 Spoke with pt [name], sent request to pharmacy team for re newel of pap/ grant for Dupixent  as requested by patient.

## 2024-12-24 NOTE — Telephone Encounter (Signed)
 Copied from CRM [id number]. Topic: Clinical - Lab/Test Results >> Dec 21, 2024  3:54 PM Russell PARAS wrote: Reason for CRM:   Pt contacting clinic, returning missed call. Advised was reaching out to review bone density results. Pt has further questions concerning results and would like call back  CB# [phone]   Called and spoke with the pt and reviewed Dr. Neysa result note.  Pt is aware and will reach out to PCP.  Pt also requested results be shared to PCP.  I have forwarded results.  Nothing further needed.

## 2024-12-26 ENCOUNTER — Other Ambulatory Visit

## 2025-01-01 ENCOUNTER — Ambulatory Visit (INDEPENDENT_AMBULATORY_CARE_PROVIDER_SITE_OTHER): Payer: Self-pay | Admitting: Family

## 2025-01-01 VITALS — BP 127/72 | HR 79 | Temp 98.1°F | Resp 16 | Ht 67.0 in | Wt 199.0 lb

## 2025-01-01 DIAGNOSIS — J4489 Other specified chronic obstructive pulmonary disease: Secondary | ICD-10-CM

## 2025-01-01 DIAGNOSIS — Z7985 Long-term (current) use of injectable non-insulin antidiabetic drugs: Secondary | ICD-10-CM | POA: Diagnosis not present

## 2025-01-01 DIAGNOSIS — Z23 Encounter for immunization: Secondary | ICD-10-CM

## 2025-01-01 DIAGNOSIS — E782 Mixed hyperlipidemia: Secondary | ICD-10-CM | POA: Diagnosis not present

## 2025-01-01 DIAGNOSIS — I10 Essential (primary) hypertension: Secondary | ICD-10-CM | POA: Diagnosis not present

## 2025-01-01 DIAGNOSIS — E119 Type 2 diabetes mellitus without complications: Secondary | ICD-10-CM

## 2025-01-01 MED ORDER — TIRZEPATIDE 7.5 MG/0.5ML ~~LOC~~ SOAJ: 7.5000 mg | SUBCUTANEOUS | 0 refills | Status: AC

## 2025-01-01 NOTE — Assessment & Plan Note (Signed)
 Continues crestor . Update lipids.  Lab Results  Component Value Date   CHOL 178 06/15/2024   HDL 45 (L) 06/15/2024   LDLCALC 110 (H) 06/15/2024   TRIG 118 06/15/2024   CHOLHDL 4.0 06/15/2024

## 2025-01-01 NOTE — Patient Instructions (Signed)
" ° ° ° ° °  VISIT SUMMARY: During your visit today, we discussed several health concerns and made adjustments to your treatment plan. You mentioned a small, painless cyst on your leg, your current medications, and recent vaccinations. We also reviewed your progress with weight management, asthma, blood pressure, cholesterol, and heartburn symptoms.  YOUR PLAN: -TYPE 2 DIABETES MELLITUS: Type 2 diabetes is a condition where your body does not use insulin properly, leading to high blood sugar levels. We increased your Mounjaro  dose to 7.5 mg after your current supply to help with weight loss. Be aware of potential side effects like nausea and constipation, which should improve in 1-2 weeks. Please download a discount for Mounjaro  from their website.  -ASTHMA-COPD OVERLAP SYNDROME: Asthma-COPD overlap syndrome is a condition where you have symptoms of both asthma and chronic obstructive pulmonary disease. Your asthma is well-controlled with Dupixent  and Trelegy, so continue using them as prescribed.  -PRIMARY HYPERTENSION: Primary hypertension is high blood pressure without a known cause. Your blood pressure is well-controlled with lisinopril  20 mg daily. Continue taking this medication as prescribed. We have refilled your lisinopril  prescription.  -MIXED HYPERLIPIDEMIA: Mixed hyperlipidemia is a condition with high levels of different types of fats in your blood. You are managing this with rosuvastatin , and we have refilled your prescription. We also checked your cholesterol levels today.  -GASTROESOPHAGEAL REFLUX DISEASE: Gastroesophageal reflux disease (GERD) is when stomach acid frequently flows back into the tube connecting your mouth and stomach. You can manage your symptoms with over-the-counter omeprazole  or Tums as needed.  -BENIGN SKIN CYST, LOWER EXTREMITY: A benign skin cyst is a non-cancerous lump under the skin. The small cyst on your leg is not painful or concerning. Please monitor it for any  changes in size or symptoms.  -GENERAL HEALTH MAINTENANCE: You have received your flu and COVID vaccinations. Today, we administered the Prevnar 20 booster for pneumonia prevention.  INSTRUCTIONS: Increase your Mounjaro  dose to 7.5 mg after your current supply. Continue taking your medications as prescribed. Monitor the cyst on your leg for any changes. Use over-the-counter omeprazole  or Tums as needed for heartburn. Download a discount for Mounjaro  from their website. Follow up with any new or worsening symptoms.                                       "

## 2025-01-01 NOTE — Assessment & Plan Note (Signed)
 Lab Results  Component Value Date   HGBA1C 6.3 09/26/2024   HGBA1C 6.1 (H) 06/15/2024   HGBA1C 6.3 12/06/2023   Lab Results  Component Value Date   MICROALBUR 0.3 06/15/2024   LDLCALC 110 (H) 06/15/2024   CREATININE 0.74 09/26/2024   Maintained on Mounjaro  5 mg.  Will increase to 7.5 mg to see if we can help more with weight loss.

## 2025-01-01 NOTE — Assessment & Plan Note (Signed)
 BP Readings from Last 3 Encounters:  01/01/25 127/72  11/29/24 128/72  10/12/24 124/84   At goal on lisinopril  20mg .

## 2025-01-01 NOTE — Assessment & Plan Note (Signed)
 Patient is doing wery well on Dupixent .

## 2025-01-01 NOTE — Progress Notes (Signed)
 "  Subjective:     Patient ID: [name], female    DOB: 09-28-1966, 59 y.o.   MRN: [id number]  Chief Complaint  Patient presents with   Hypertension    Here for follow up   Diabetes    Here for follow  up   Cyst    Patient reports feeling a grown on right shin    Hypertension  Diabetes    Discussed the use of AI scribe software for clinical note transcription with the patient, who gave verbal consent to proceed.  History of Present Illness [name] is a 59 year old female who presents for a routine follow-up visit.  She has a small, painless knot on her leg, which she describes as a little cyst. She is monitoring it for any changes.  She reports taking lisinopril  20 mg daily for blood pressure control.  She is on Dupixent  for asthma, which has allowed her to discontinue prednisone . She has experienced significant improvement in her symptoms, including reduced ankle swelling. However, she notes easy bruising and occasional unexplained bruises on her legs. She uses a Trelegy inhaler, one puff daily, and reports a recent allergy  flare-up that has since calmed down. She also takes allergy  medication as needed.  She uses omeprazole  occasionally for heartburn symptoms, which she describes as feeling like food is stuck in her chest. She has leftover medication from a previous hospital visit and uses it as needed.  She is on Mounjaro  5 mg for weight management and has lost six pounds. She has not experienced nausea with the current dose.  She takes rosuvastatin  for cholesterol management and is due for a refill.  She received her flu and COVID vaccinations recently and has completed the shingles vaccination series in 2021.  Her social history includes working as a production designer, theatre/television/film at [employer], and she prefers to fill prescriptions at a pharmacy near her workplace for convenience. She has two daughters, aged almost 73 and 44, with the younger one recently experiencing a  stomach illness.      Health Maintenance Due  Topic Date Due   OPHTHALMOLOGY EXAM  12/07/2024    Past Medical History:  Diagnosis Date   Allergic rhinitis, cause unspecified    Allergy     Arthritis    left knee   Asthma    hx of severe asthma w/ copd, follows w/ Dr. Reggy Salt, pulmonologist and PCP (Per 08/30/23 note from Eleanor Archer, NP, asthma is much improved on Dupixent  and Trellegy.) As of 08/30/23, per patient, last asthma exacerbation in early 05/2023 when patient went to [place] . Patient states her asthma is greatly improved on Dupixent  injections and that she rarely uses prednisone .   Clotting disorder    DVT 2005   Complication of anesthesia    Diabetes type 2, controlled (HCC)    no meds, controlled with diet and exercise   History of DVT (deep vein thrombosis) 2005   Pt reports blood clot below knee ?unsure which leg.   Hyperlipidemia    Nasal polyps    Osteopenia    PONV (postoperative nausea and vomiting)    Unspecified essential hypertension    Follows w/ PCP.   Wears glasses     Past Surgical History:  Procedure Laterality Date   COLONOSCOPY     02/05/21 & 12/12/18hx of colon polyps   CYSTOSCOPY N/A 09/14/2023   Procedure: CYSTOSCOPY;  Surgeon: Sarrah Browning, MD;  Location: [facility];  Service: Gynecology;  Laterality:  N/A;   DILATION AND CURETTAGE OF UTERUS  2017   removed fibroids   LASIK Bilateral 2008   NASAL SINUS SURGERY  2008   Dr Arlana   OVARIAN CYST REMOVAL  02/2016   pt reported-- Dr Sarrah   POLYPECTOMY     ROBOTIC ASSISTED TOTAL HYSTERECTOMY WITH BILATERAL SALPINGO OOPHERECTOMY Bilateral 09/14/2023   Procedure: XI ROBOTIC ASSISTED TOTAL HYSTERECTOMY WITH BILATERAL SALPINGECTOMY;  Surgeon: Sarrah Browning, MD;  Location: [facility];  Service: Gynecology;  Laterality: Bilateral;   UTERINE FIBROID SURGERY  2005    Family History  Problem Relation Age of Onset   Cancer Mother 8        ?carcinoid tumor (had surgical removal)   Diabetes Mother        borderline   Stomach cancer Mother    Liver cancer Mother    Heart disease Father        died 69, from MI, had hx of ETOH abuse and liver disease   Thyroid  cancer Sister        (diagnosed at 46) half sister (same dad)    Diabetes type II Brother    Kidney disease Neg Hx    Hypertension Neg Hx    Hyperlipidemia Neg Hx    Colon cancer Neg Hx    Esophageal cancer Neg Hx    Rectal cancer Neg Hx    Colon polyps Neg Hx     Social History   Socioeconomic History   Marital status: Married    Spouse name: Not on file   Number of children: Not on file   Years of education: Not on file   Highest education level: Not on file  Occupational History   Occupation: Production Designer, Theatre/television/film [employer]  Tobacco Use   Smoking status: Never    Passive exposure: Past   Smokeless tobacco: Never  Vaping Use   Vaping status: Never Used  Substance and Sexual Activity   Alcohol use: Not Currently   Drug use: No   Sexual activity: Yes    Birth control/protection: None, Post-menopausal  Other Topics Concern   Not on file  Social History Narrative   Married   No children   No pets   Works at [employer] as a production designer, theatre/television/film    Enjoys movies, shopping, 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     Social Drivers of Health   Tobacco Use: Low Risk (12/14/2024)   Patient History    Smoking Tobacco Use: Never    Smokeless Tobacco Use: Never    Passive Exposure: Past  Financial Resource Strain: Not on file  Food Insecurity: Not on file  Transportation Needs: Not on file  Physical Activity: Not on file  Stress: Not on file  Social Connections: Not on file  Intimate Partner Violence: Not on file  Depression (PHQ2-9): Low Risk (01/01/2025)   Depression (PHQ2-9)    PHQ-2 Score: 1  Alcohol Screen: Not on file  Housing: Not on file  Utilities: Not on file  Health Literacy: Not on file    Outpatient Medications Prior to Visit   Medication Sig Dispense Refill   albuterol  (PROAIR  HFA) 108 (90 Base) MCG/ACT inhaler Inhale 2 puffs into the lungs every 6 (six) hours as needed for wheezing or shortness of breath. 18 g 12   albuterol  (PROVENTIL ) (2.5 MG/3ML) 0.083% nebulizer solution Take 2.5 mg by nebulization every 4 (four) hours as needed for wheezing or shortness of breath.  Calcium  Carbonate-Vit D-Min (CALTRATE 600+D PLUS MINERALS) 600-800 MG-UNIT CHEW Chew 1 tablet by mouth in the morning and at bedtime. 60 tablet    cetirizine (ZYRTEC) 10 MG tablet Take 10 mg by mouth daily.     DUPIXENT  300 MG/2ML SOAJ INJECT 1 PEN SUBCUTANEOUSLY EVERY 14 DAYS 12 mL 1   Fluticasone -Umeclidin-Vilant (TRELEGY ELLIPTA ) 100-62.5-25 MCG/ACT AEPB Inhale 1 puff into the lungs daily. 60 each 11   lisinopril  (ZESTRIL ) 20 MG tablet Take 1 tablet (20 mg total) by mouth daily. 90 tablet 1   Multiple Vitamin (MULTIVITAMIN WITH MINERALS) TABS tablet Take 1 tablet by mouth daily. For 50 over.     rosuvastatin  (CRESTOR ) 5 MG tablet Take 1 tablet (5 mg total) by mouth daily. 90 tablet 1   Suzetrigine  50 MG TABS Take 50 mg by mouth every 12 (twelve) hours as needed. Take two 50mg  capsules for your first dose at the start of a pain episode on an empty stomach, followed by 50mg  (1 capsule)  with or without food every 12 hours until pain episode is improved 29 tablet 0   Vitamin D , Cholecalciferol , 25 MCG (1000 UT) CAPS Take 1 capsule by mouth daily. 60 capsule 3   omeprazole  (PRILOSEC) 20 MG capsule Take 1 capsule (20 mg total) by mouth daily. 90 capsule 1   tirzepatide  (MOUNJARO ) 5 MG/0.5ML Pen Inject 5 mg into the skin once a week. 6 mL 0   Cyanocobalamin (B-12) 2500 MCG SUBL Place under the tongue.     fluticasone  (FLONASE ) 50 MCG/ACT nasal spray 2 sprays each nostril daily 16 g 12   No facility-administered medications prior to visit.    Allergies[1]  ROS See HPI    Objective:    Physical Exam Constitutional:      General: She is not in  acute distress.    Appearance: Normal appearance. She is well-developed.  HENT:     Head: Normocephalic and atraumatic.     Right Ear: External ear normal.     Left Ear: External ear normal.  Eyes:     General: No scleral icterus. Neck:     Thyroid : No thyromegaly.  Cardiovascular:     Rate and Rhythm: Normal rate and regular rhythm.     Heart sounds: Normal heart sounds. No murmur heard. Pulmonary:     Effort: Pulmonary effort is normal. No respiratory distress.     Breath sounds: Normal breath sounds. No wheezing.  Musculoskeletal:     Cervical back: Neck supple.  Skin:    General: Skin is warm and dry.     Comments: Pea sized mobile subcutaneous cyst right shin  Neurological:     Mental Status: She is alert and oriented to person, place, and time.  Psychiatric:        Mood and Affect: Mood normal.        Behavior: Behavior normal.        Thought Content: Thought content normal.        Judgment: Judgment normal.      BP 127/72 (BP Location: Right Arm, Patient Position: Sitting, Cuff Size: Normal)   Pulse 79   Temp 98.1 F (36.7 C) (Oral)   Resp 16   Ht 5' 7 (1.702 m)   Wt 199 lb (90.3 kg)   LMP 06/22/2018   SpO2 100%   BMI 31.17 kg/m  Wt Readings from Last 3 Encounters:  01/01/25 199 lb (90.3 kg)  11/29/24 202 lb 12.8 oz (92 kg)  10/12/24 205 lb  12.8 oz (93.4 kg)   Diabetic Foot Exam - Simple   Simple Foot Form Diabetic Foot exam was performed with the following findings: Yes 01/01/2025  3:28 PM  Visual Inspection No deformities, no ulcerations, no other skin breakdown bilaterally: Yes Sensation Testing Intact to touch and monofilament testing bilaterally: Yes Pulse Check Posterior Tibialis and Dorsalis pulse intact bilaterally: Yes Comments        Assessment & Plan:   Problem List Items Addressed This Visit       Unprioritized   Primary hypertension - Primary   BP Readings from Last 3 Encounters:  01/01/25 127/72  11/29/24 128/72  10/12/24  124/84   At goal on lisinopril  20mg .       Hyperlipidemia   Continues crestor . Update lipids.  Lab Results  Component Value Date   CHOL 178 06/15/2024   HDL 45 (L) 06/15/2024   LDLCALC 110 (H) 06/15/2024   TRIG 118 06/15/2024   CHOLHDL 4.0 06/15/2024         Relevant Orders   Lipid panel   Diabetes type 2, controlled (HCC)   Lab Results  Component Value Date   HGBA1C 6.3 09/26/2024   HGBA1C 6.1 (H) 06/15/2024   HGBA1C 6.3 12/06/2023   Lab Results  Component Value Date   MICROALBUR 0.3 06/15/2024   LDLCALC 110 (H) 06/15/2024   CREATININE 0.74 09/26/2024   Maintained on Mounjaro  5 mg.  Will increase to 7.5 mg to see if we can help more with weight loss.      Relevant Medications   tirzepatide  (MOUNJARO ) 7.5 MG/0.5ML Pen   Asthma-COPD overlap syndrome (HCC)   Patient is doing wery well on Dupixent .        Other Visit Diagnoses       Controlled type 2 diabetes mellitus without complication, without long-term current use of insulin (HCC)       Relevant Medications   tirzepatide  (MOUNJARO ) 7.5 MG/0.5ML Pen   Other Relevant Orders   HgB A1c   Basic Metabolic Panel (BMET)     Need for pneumococcal 20-valent conjugate vaccination       Relevant Orders   Pneumococcal conjugate vaccine 20-valent (Prevnar 20) (Completed)       I have discontinued Annlouise [name] Sides's fluticasone , B-12, tirzepatide , and omeprazole . I am also having her start on tirzepatide . Additionally, I am having her maintain her cetirizine, albuterol , Caltrate 600+D Plus Minerals, multivitamin with minerals, albuterol , rosuvastatin , lisinopril , Suzetrigine , Vitamin D  (Cholecalciferol ), Dupixent , and Trelegy Ellipta .  Meds ordered this encounter  Medications   tirzepatide  (MOUNJARO ) 7.5 MG/0.5ML Pen    Sig: Inject 7.5 mg into the skin once a week.    Dispense:  6 mL    Refill:  0    Supervising Provider:   DOMENICA BLACKBIRD A [[id number]]      [1]  Allergies Allergen Reactions   Aspirin Other (See  Comments)    wheezing   Latex Hives   Shellfish Allergy  Swelling   Shellfish Protein-Containing Drug Products Other (See Comments)    Anaphylactic reaction   Gabapentin  Other (See Comments)    Stange feeling and increased appetite   Penicillins Rash and Other (See Comments)    Product containing penicillin (product)   "

## 2025-01-02 LAB — BASIC METABOLIC PANEL WITH GFR
BUN: 16 mg/dL (ref 6–23)
CO2: 28 meq/L (ref 19–32)
Calcium: 9.4 mg/dL (ref 8.4–10.5)
Chloride: 105 meq/L (ref 96–112)
Creatinine, Ser: 0.72 mg/dL (ref 0.40–1.20)
GFR: 92.99 mL/min
Glucose, Bld: 80 mg/dL (ref 70–99)
Potassium: 4 meq/L (ref 3.5–5.1)
Sodium: 141 meq/L (ref 135–145)

## 2025-01-02 LAB — LIPID PANEL
Cholesterol: 122 mg/dL (ref 28–200)
HDL: 46.8 mg/dL
LDL Cholesterol: 55 mg/dL (ref 10–99)
NonHDL: 75.48
Total CHOL/HDL Ratio: 3
Triglycerides: 101 mg/dL (ref 10.0–149.0)
VLDL: 20.2 mg/dL (ref 0.0–40.0)

## 2025-01-02 LAB — HEMOGLOBIN A1C: Hgb A1c MFr Bld: 5.9 % (ref 4.6–6.5)

## 2025-01-03 ENCOUNTER — Ambulatory Visit: Payer: Self-pay | Admitting: Family

## 2025-01-14 ENCOUNTER — Encounter: Admitting: Physical Medicine & Rehabilitation

## 2025-01-15 ENCOUNTER — Other Ambulatory Visit: Payer: Self-pay | Admitting: Internal Medicine

## 2025-01-18 ENCOUNTER — Telehealth: Payer: Self-pay | Admitting: Family

## 2025-01-18 NOTE — Telephone Encounter (Signed)
 Pt dropped off paper for pcp. Pt states she doesn't need it back. Left paper in pcps box.

## 2025-01-22 NOTE — Telephone Encounter (Signed)
 Records requested

## 2025-01-22 NOTE — Telephone Encounter (Signed)
 Correction: form from eye provider given to pcp

## 2025-04-02 ENCOUNTER — Ambulatory Visit: Payer: Self-pay | Admitting: Family

## 2025-06-03 ENCOUNTER — Encounter: Admitting: Pulmonary Disease
